# Patient Record
Sex: Male | Born: 1960 | Race: Black or African American | Hispanic: No | Marital: Married | Smoking: Former smoker
Health system: Southern US, Community
[De-identification: ages and names within clinical notes are randomized; demographics above are authoritative.]

## PROBLEM LIST (undated history)

## (undated) DIAGNOSIS — R0600 Dyspnea, unspecified: Secondary | ICD-10-CM

## (undated) DIAGNOSIS — C61 Malignant neoplasm of prostate: Secondary | ICD-10-CM

## (undated) DIAGNOSIS — R351 Nocturia: Secondary | ICD-10-CM

## (undated) DIAGNOSIS — C7951 Secondary malignant neoplasm of bone: Principal | ICD-10-CM

## (undated) DIAGNOSIS — I4891 Unspecified atrial fibrillation: Secondary | ICD-10-CM

## (undated) DIAGNOSIS — Z7189 Other specified counseling: Secondary | ICD-10-CM

## (undated) DIAGNOSIS — M549 Dorsalgia, unspecified: Secondary | ICD-10-CM

## (undated) DIAGNOSIS — I499 Cardiac arrhythmia, unspecified: Secondary | ICD-10-CM

## (undated) DIAGNOSIS — Z923 Personal history of irradiation: Secondary | ICD-10-CM

## (undated) HISTORY — DX: Malignant neoplasm of prostate: C61

## (undated) HISTORY — DX: Other specified counseling: Z71.89

## (undated) HISTORY — DX: Dorsalgia, unspecified: M54.9

## (undated) HISTORY — PX: NO PAST SURGERIES: SHX2092

## (undated) HISTORY — DX: Secondary malignant neoplasm of bone: C79.51

## (undated) HISTORY — DX: Personal history of irradiation: Z92.3

---

## 2000-07-24 ENCOUNTER — Emergency Department (HOSPITAL_COMMUNITY): Admission: EM | Admit: 2000-07-24 | Discharge: 2000-07-24 | Payer: Self-pay

## 2016-03-15 ENCOUNTER — Emergency Department (HOSPITAL_BASED_OUTPATIENT_CLINIC_OR_DEPARTMENT_OTHER)
Admission: EM | Admit: 2016-03-15 | Discharge: 2016-03-15 | Disposition: A | Discharge status: Home / Self Care | Payer: BLUE CROSS/BLUE SHIELD | Attending: Physician Assistant | Admitting: Physician Assistant

## 2016-03-15 ENCOUNTER — Emergency Department (HOSPITAL_BASED_OUTPATIENT_CLINIC_OR_DEPARTMENT_OTHER): Payer: BLUE CROSS/BLUE SHIELD

## 2016-03-15 ENCOUNTER — Encounter (HOSPITAL_BASED_OUTPATIENT_CLINIC_OR_DEPARTMENT_OTHER): Payer: Self-pay | Admitting: *Deleted

## 2016-03-15 DIAGNOSIS — M545 Low back pain, unspecified: Secondary | ICD-10-CM

## 2016-03-15 DIAGNOSIS — F17210 Nicotine dependence, cigarettes, uncomplicated: Secondary | ICD-10-CM | POA: Insufficient documentation

## 2016-03-15 MED ORDER — IBUPROFEN 800 MG PO TABS: 800.0000 mg | ORAL_TABLET | Freq: Three times a day (TID) | ORAL | 0 refills | Status: DC

## 2016-03-15 MED ORDER — CYCLOBENZAPRINE HCL 10 MG PO TABS: 10.0000 mg | ORAL_TABLET | Freq: Two times a day (BID) | ORAL | 0 refills | Status: DC | PRN

## 2016-03-15 NOTE — ED Provider Notes (Signed)
[facility] Provider Note   CSN: [id number] Arrival date & time: 03/15/16  1555  By signing my name below, I, Jeanell Sparrow, attest that this documentation has been prepared under the direction and in the presence of Gonsalo Cuthbertson Julio Alm, MD. Electronically Signed: Jeanell Sparrow, Scribe. 03/15/2016. 5:01 PM.  History   Chief Complaint Chief Complaint  Patient presents with  . Back Pain   The history is provided by the patient. No language interpreter was used.   HPI Comments: Ryan Davis is a 56 y.o. male who presents to the Emergency Department complaining of constant moderate lower back pain that started about 3 weeks ago. He states his pain was onset by lifting his wife. He describes the pain as radiating to his abdomen and exacerbated by movement. No associated symptoms. He denies any gait problem, numbness/tingling, or other complaints.   History reviewed. No pertinent past medical history.  There are no active problems to display for this patient.   History reviewed. No pertinent surgical history.     Home Medications    Prior to Admission medications   Not on File    Family History History reviewed. No pertinent family history.  Social History Social History  Substance Use Topics  . Smoking status: Current Every Day Smoker    Packs/day: 0.50    Types: Cigarettes  . Smokeless tobacco: Never Used  . Alcohol use Yes     Comment: 40oz daily     Allergies   Patient has no known allergies.   Review of Systems Review of Systems  Gastrointestinal: Positive for abdominal pain.  Musculoskeletal: Positive for back pain. Negative for gait problem.  Neurological: Negative for numbness.  All other systems reviewed and are negative.    Physical Exam Updated Vital Signs BP 132/100 (BP Location: Left Arm)   Pulse 101   Temp 97.9 F (36.6 C) (Oral)   Resp 18   Ht 6' 2.5" (1.892 m)   Wt 178 lb (80.7 kg)   SpO2 100%   BMI 22.55 kg/m    Physical Exam  Constitutional: He appears well-developed and well-nourished. No distress.  HENT:  Head: Normocephalic and atraumatic.  Eyes: Conjunctivae are normal.  Neck: Neck supple.  Cardiovascular: Normal rate.   Pulmonary/Chest: Effort normal.  Abdominal: Soft.  Musculoskeletal: Normal range of motion.  TTP to the lumbar paraspinal area. Good strength in BLE.   Neurological: He is alert.  Skin: Skin is warm and dry.  Psychiatric: He has a normal mood and affect.  Nursing note and vitals reviewed.    ED Treatments / Results  DIAGNOSTIC STUDIES: Oxygen Saturation is 100% on RA, normal by my interpretation.    COORDINATION OF CARE: 5:05 PM- Pt advised of plan for treatment and pt agrees.  Labs (all labs ordered are listed, but only abnormal results are displayed) Labs Reviewed - No data to display  EKG  EKG Interpretation None       Radiology Dg Lumbar Spine Complete  Result Date: 03/15/2016 CLINICAL DATA:  Patient with acute onset low back pain for 3 weeks. Twisting injury. Initial encounter. EXAM: LUMBAR SPINE - COMPLETE 4+ VIEW COMPARISON:  None. FINDINGS: There is no evidence of lumbar spine fracture. Alignment is normal. Intervertebral disc spaces are maintained. There is suggestion of patchy sclerosis involving the lumbar spine and sacrum. IMPRESSION: No acute osseous abnormality. Suggestion of patchy sclerosis involving the lumbar spine and sacrum which is nonspecific and may be secondary to technical factors and overlying  bowel gas. True sclerotic osseous lesions are not excluded. Recommend correlation with lumbar spine CT in the non acute setting to exclude the possibility of true osseous lesions. Electronically Signed   By: Lovey Newcomer M.D.   On: 03/15/2016 17:49    Procedures Procedures (including critical care time)  Medications Ordered in ED Medications - No data to display   Initial Impression / Assessment and Plan / ED Course  I have reviewed  the triage vital signs and the nursing notes.  Pertinent labs & imaging results that were available during my care of the patient were reviewed by me and considered in my medical decision making (see chart for details).    I personally performed the services described in this documentation, which was scribed in my presence. The recorded information has been reviewed and is accurate.    Patient's well-appearing 56 year old male presenting with back pain. Patient is here because his mother who he cares for was being brought in for her  high sugars. He reports he has back pain from helping move her around. Chronic.  No red flags. Normal physical exam. We'll get x-ray and likely discharge with muscle relaxants and ibuprofen.  Patient told to follow up ouatpiatne for L spine CT.    Final Clinical Impressions(s) / ED Diagnoses   Final diagnoses:  Lumbar pain  Acute bilateral low back pain without sciatica    New Prescriptions New Prescriptions   No medications on file      Kristion Holifield Julio Alm, MD 03/15/16 1810

## 2016-03-15 NOTE — ED Triage Notes (Signed)
Pt reports back pain x 3 weeks.  Ambulatory, no difficulty moving.  denies known injury.

## 2016-03-15 NOTE — Discharge Instructions (Signed)
We think that this pain is likely a muscle strain from helping a family member. However the x-ray shows some nonspecific findings we'll need to have a repeat CAT scan by your primary care physician's as an outpatient.

## 2016-03-26 ENCOUNTER — Emergency Department (HOSPITAL_BASED_OUTPATIENT_CLINIC_OR_DEPARTMENT_OTHER): Payer: BLUE CROSS/BLUE SHIELD

## 2016-03-26 ENCOUNTER — Emergency Department (HOSPITAL_BASED_OUTPATIENT_CLINIC_OR_DEPARTMENT_OTHER)
Admission: EM | Admit: 2016-03-26 | Discharge: 2016-03-26 | Disposition: A | Discharge status: Home / Self Care | Payer: BLUE CROSS/BLUE SHIELD | Attending: Emergency Medicine | Admitting: Emergency Medicine

## 2016-03-26 ENCOUNTER — Encounter (HOSPITAL_BASED_OUTPATIENT_CLINIC_OR_DEPARTMENT_OTHER): Payer: Self-pay | Admitting: *Deleted

## 2016-03-26 DIAGNOSIS — M545 Low back pain, unspecified: Secondary | ICD-10-CM

## 2016-03-26 DIAGNOSIS — F17210 Nicotine dependence, cigarettes, uncomplicated: Secondary | ICD-10-CM | POA: Insufficient documentation

## 2016-03-26 DIAGNOSIS — Y999 Unspecified external cause status: Secondary | ICD-10-CM | POA: Diagnosis not present

## 2016-03-26 DIAGNOSIS — Y9389 Activity, other specified: Secondary | ICD-10-CM | POA: Diagnosis not present

## 2016-03-26 DIAGNOSIS — X500XXA Overexertion from strenuous movement or load, initial encounter: Secondary | ICD-10-CM | POA: Diagnosis not present

## 2016-03-26 DIAGNOSIS — Y929 Unspecified place or not applicable: Secondary | ICD-10-CM | POA: Diagnosis not present

## 2016-03-26 DIAGNOSIS — S3992XA Unspecified injury of lower back, initial encounter: Secondary | ICD-10-CM | POA: Diagnosis present

## 2016-03-26 MED ORDER — HYDROCODONE-ACETAMINOPHEN 5-325 MG PO TABS: 1.0000 | ORAL_TABLET | Freq: Four times a day (QID) | ORAL | 0 refills | Status: DC | PRN

## 2016-03-26 MED ORDER — PREDNISONE 10 MG (21) PO TBPK: ORAL_TABLET | Freq: Every day | ORAL | 0 refills | Status: DC

## 2016-03-26 MED ORDER — TRAMADOL HCL 50 MG PO TABS: 50.0000 mg | ORAL_TABLET | Freq: Four times a day (QID) | ORAL | 0 refills | Status: DC | PRN

## 2016-03-26 MED FILL — HYDROCODON-APAP 5-325: 5-325 | 5 days supply | Qty: 20 | Fill #0

## 2016-03-26 NOTE — Discharge Instructions (Addendum)
We have discussed her CAT scan findings. I have given your information to the oncologist upstairs to call you for an appointment. Have given you their information to call them if he did not hear from them the next week or so. They took the pain medicine. Return to the ED if her symptoms worsen.

## 2016-03-26 NOTE — ED Triage Notes (Signed)
Pt c/o back pain. 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 States pain goes into his legs as well

## 2016-03-26 NOTE — ED Notes (Signed)
Patient transported to CT 

## 2016-03-27 NOTE — ED Provider Notes (Signed)
[facility] Provider Note   CSN: [id number] Arrival date & time: 03/26/16  1059     History   Chief Complaint Chief Complaint  Patient presents with  . Back Pain    HPI Ryan Davis is a 56 y.o. male.  HPI 56 year old African-American male with no significant past medical history presents to the ED today with complaints of constant increased lower back pain. States that his symptoms started approximately 4 weeks ago. States the pain was caused by lifting his bedbound wife. States the pain is in the middle of his lower back and radiates down his bilateral legs. Movement makes the pain worse. Nothing makes the pain better. He was seen in the ER on 2/17 for same. X-rays showed a possible osseous lesions and encouraged outpatient CT scan. Patient states they've been unable to get a CAT scan performed. He was given muscle relaxers and NSAIDs with little relief in his symptoms. Return today for increasing pain. Denies any urinary retention, loss of bowel or bladder, saddle paresthesias, lower show any paresthesias, gait problems, fevers, history of IV drug use, history of cancer, urinary symptoms. History reviewed. No pertinent past medical history.  There are no active problems to display for this patient.   History reviewed. No pertinent surgical history.     Home Medications    Prior to Admission medications   Medication Sig Start Date End Date Taking? Authorizing Provider  cyclobenzaprine (FLEXERIL) 10 MG tablet Take 1 tablet (10 mg total) by mouth 2 (two) times daily as needed for muscle spasms. 03/15/16  Yes Courteney Lyn Mackuen, MD  ibuprofen (ADVIL,MOTRIN) 800 MG tablet Take 1 tablet (800 mg total) by mouth 3 (three) times daily. 03/15/16  Yes Courteney Lyn Mackuen, MD  HYDROcodone-acetaminophen (NORCO) 5-325 MG tablet Take 1 tablet by mouth every 6 (six) hours as needed. 03/26/16   Doristine Devoid, PA-C    Family History No family history on file.  Social  History Social History  Substance Use Topics  . Smoking status: Current Every Day Smoker    Packs/day: 0.50    Types: Cigarettes  . Smokeless tobacco: Never Used  . Alcohol use Yes     Comment: 40oz daily     Allergies   Patient has no known allergies.   Review of Systems Review of Systems  Constitutional: Negative for chills and fever.  HENT: Negative for congestion.   Eyes: Negative for visual disturbance.  Respiratory: Negative for cough and shortness of breath.   Cardiovascular: Negative for chest pain and palpitations.  Gastrointestinal: Negative for abdominal pain, nausea and vomiting.  Genitourinary: Negative for hematuria and urgency.  Musculoskeletal: Positive for back pain. Negative for gait problem and neck pain.  Skin: Negative.   Neurological: Negative for dizziness, weakness, light-headedness, numbness and headaches.  All other systems reviewed and are negative.    Physical Exam Updated Vital Signs BP 132/92   Pulse 78   Temp 98.1 F (36.7 C) (Oral)   Resp 16   Ht 6\' 2"  (1.88 m)   Wt 81.6 kg   SpO2 99%   BMI 23.11 kg/m   Physical Exam  Constitutional: He is oriented to person, place, and time. He appears well-developed and well-nourished. No distress.  Eyes: Right eye exhibits no discharge. Left eye exhibits no discharge. No scleral icterus.  Neck: Normal range of motion. Neck supple.  Pulmonary/Chest: No respiratory distress.  Musculoskeletal: Normal range of motion.  Midline L-spine tenderness. L-spine paraspinal tenderness. Radiates to the  bilateral buttocks. Positive straight leg raise test bilaterally that reproduces pain and paresthesias. No midline T-spine tenderness. Full range of motion.  Lymphadenopathy:    He has no cervical adenopathy.  Neurological: He is alert and oriented to person, place, and time.  Strength 5 out of 5 in lower extremity. Sensation intact sharp/dull. Cap refill normal. DP pulses are 2+ bilaterally. The patient able  to ambulate with normal gait.  Skin: Skin is warm and dry. Capillary refill takes less than 2 seconds. No pallor.  Nursing note and vitals reviewed.    ED Treatments / Results  Labs (all labs ordered are listed, but only abnormal results are displayed) Labs Reviewed - No data to display  EKG  EKG Interpretation None       Radiology Ct Lumbar Spine Wo Contrast  Result Date: 03/26/2016 CLINICAL DATA:  Low back pain.  Leg pain. EXAM: CT LUMBAR SPINE WITHOUT CONTRAST TECHNIQUE: Multidetector CT imaging of the lumbar spine was performed without intravenous contrast administration. Multiplanar CT image reconstructions were also generated. COMPARISON:  Radiographs dated 03/15/2016 and 10/03/2006 FINDINGS: Segmentation: 13 rib-bearing vertebra. Four typical lumbar segments. I will term the lowest 5 segments as L1 through L5 for purposes of this exam. Alignment: Normal. Vertebrae: Extensive sclerotic metastatic disease throughout the entire visualized portion of the skeleton including the lower thoracic and lumbar spine, ribs, and the visualized portions of the pelvic bones. Paraspinal and other soft tissues: Normal.  No discrete adenopathy. Disc levels: The discs are normal throughout the lumbar spine. No visible tumor extension into the spinal canal. IMPRESSION: Extensive blastic metastatic disease throughout the entire visualized portion of the skeleton. 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 No current pathologic fractures. No disc protrusions or discrete extension of tumor into the spinal canal. Electronically Signed   By: Lorriane Shire M.D.   On: 03/26/2016 12:43    Procedures Procedures (including critical care time)  Medications Ordered in ED Medications - No data to display   Initial Impression / Assessment and Plan / ED Course  I have reviewed the triage vital signs and the nursing notes.  Pertinent labs & imaging results that were available during my care of the  patient were reviewed by me and considered in my medical decision making (see chart for details).     Patient resents to the ED with ongoing low back pain that is unresolved by muscle relaxers and NSAIDs. X-rays performed last week show possible osseous lesions. Patient does not have a PCP to follow up outpatient for nonemergent CAT scan. Lumbar CAT scan was performed in the ED today. CT shows extensive blastic metastases disease throughout the entire lumbar region likely due to metastatic prostate cancer. Patient no history of cancer. Discussed findings with the patient. Have spoken with Dr. Eldridge Scot with oncology. Has recommended that I refer patient to Dr. Jillene Bucks. Spoke to his nurse will call patient for an appointment this week. Has no further recommendations. Patient is hemodynamically stable. Patient was updated on plan of care. Given pain medicine. Instructed return precautions. Has no red flag symptoms concerning for cauda equina. Patient was discussed with Dr. Stark Jock who is agreeable to the above plan. Has no further questions prior to discharge.  Final Clinical Impressions(s) / ED Diagnoses   Final diagnoses:  Acute midline low back pain without sciatica    New Prescriptions Discharge Medication List as of 03/26/2016  2:08 PM    START taking these medications   Details  HYDROcodone-acetaminophen ([place])  5-325 MG tablet Take 1 tablet by mouth every 6 (six) hours as needed., Starting Wed 03/26/2016, Print         Doristine Devoid, PA-C 03/27/16 1557    Veryl Speak, MD 03/28/16 2259

## 2016-03-28 ENCOUNTER — Ambulatory Visit: Payer: BLUE CROSS/BLUE SHIELD

## 2016-03-28 ENCOUNTER — Encounter: Payer: Self-pay | Admitting: Hematology & Oncology

## 2016-03-28 ENCOUNTER — Encounter (HOSPITAL_BASED_OUTPATIENT_CLINIC_OR_DEPARTMENT_OTHER): Payer: Self-pay | Admitting: Emergency Medicine

## 2016-03-28 ENCOUNTER — Other Ambulatory Visit (HOSPITAL_BASED_OUTPATIENT_CLINIC_OR_DEPARTMENT_OTHER): Payer: Self-pay

## 2016-03-28 ENCOUNTER — Other Ambulatory Visit: Payer: Self-pay

## 2016-03-28 ENCOUNTER — Other Ambulatory Visit (HOSPITAL_BASED_OUTPATIENT_CLINIC_OR_DEPARTMENT_OTHER): Payer: BLUE CROSS/BLUE SHIELD

## 2016-03-28 ENCOUNTER — Emergency Department (HOSPITAL_BASED_OUTPATIENT_CLINIC_OR_DEPARTMENT_OTHER): Payer: BLUE CROSS/BLUE SHIELD

## 2016-03-28 ENCOUNTER — Inpatient Hospital Stay (HOSPITAL_BASED_OUTPATIENT_CLINIC_OR_DEPARTMENT_OTHER)
Admission: EM | Admit: 2016-03-28 | Discharge: 2016-03-31 | DRG: 309 | Disposition: A | Discharge status: Home / Self Care | Payer: BLUE CROSS/BLUE SHIELD | Attending: Internal Medicine | Admitting: Internal Medicine

## 2016-03-28 ENCOUNTER — Ambulatory Visit (HOSPITAL_BASED_OUTPATIENT_CLINIC_OR_DEPARTMENT_OTHER): Payer: BLUE CROSS/BLUE SHIELD | Admitting: Hematology & Oncology

## 2016-03-28 VITALS — BP 119/82 | HR 118 | Temp 97.6°F | Resp 17

## 2016-03-28 DIAGNOSIS — R7989 Other specified abnormal findings of blood chemistry: Secondary | ICD-10-CM

## 2016-03-28 DIAGNOSIS — D6959 Other secondary thrombocytopenia: Secondary | ICD-10-CM | POA: Diagnosis present

## 2016-03-28 DIAGNOSIS — I4891 Unspecified atrial fibrillation: Principal | ICD-10-CM | POA: Diagnosis present

## 2016-03-28 DIAGNOSIS — I248 Other forms of acute ischemic heart disease: Secondary | ICD-10-CM | POA: Diagnosis present

## 2016-03-28 DIAGNOSIS — C7951 Secondary malignant neoplasm of bone: Secondary | ICD-10-CM

## 2016-03-28 DIAGNOSIS — M549 Dorsalgia, unspecified: Secondary | ICD-10-CM

## 2016-03-28 DIAGNOSIS — C801 Malignant (primary) neoplasm, unspecified: Secondary | ICD-10-CM | POA: Diagnosis not present

## 2016-03-28 DIAGNOSIS — R001 Bradycardia, unspecified: Secondary | ICD-10-CM | POA: Diagnosis not present

## 2016-03-28 DIAGNOSIS — I499 Cardiac arrhythmia, unspecified: Secondary | ICD-10-CM | POA: Diagnosis not present

## 2016-03-28 DIAGNOSIS — C61 Malignant neoplasm of prostate: Secondary | ICD-10-CM | POA: Diagnosis present

## 2016-03-28 DIAGNOSIS — I480 Paroxysmal atrial fibrillation: Secondary | ICD-10-CM | POA: Diagnosis present

## 2016-03-28 DIAGNOSIS — F17210 Nicotine dependence, cigarettes, uncomplicated: Secondary | ICD-10-CM | POA: Diagnosis present

## 2016-03-28 HISTORY — DX: Dorsalgia, unspecified: M54.9

## 2016-03-28 LAB — MAGNESIUM: MAGNESIUM: 2 mg/dL (ref 1.7–2.4)

## 2016-03-28 LAB — CBC WITH DIFFERENTIAL/PLATELET
BASOS PCT: 0 %
Band Neutrophils: 0 %
Basophils Absolute: 0 10*3/uL (ref 0.0–0.1)
Blasts: 0 %
EOS ABS: 0.2 10*3/uL (ref 0.0–0.7)
EOS PCT: 3 %
HCT: 43.6 % (ref 39.0–52.0)
Hemoglobin: 15 g/dL (ref 13.0–17.0)
LYMPHS ABS: 3.2 10*3/uL (ref 0.7–4.0)
Lymphocytes Relative: 40 %
MCH: 30.9 pg (ref 26.0–34.0)
MCHC: 34.4 g/dL (ref 30.0–36.0)
MCV: 89.7 fL (ref 78.0–100.0)
METAMYELOCYTES PCT: 0 %
MONO ABS: 0.6 10*3/uL (ref 0.1–1.0)
MONOS PCT: 7 %
MYELOCYTES: 2 %
NRBC: 0 /100{WBCs}
Neutro Abs: 4 10*3/uL (ref 1.7–7.7)
Neutrophils Relative %: 48 %
PLATELETS: 116 10*3/uL — AB (ref 150–400)
Promyelocytes Absolute: 0 %
RBC: 4.86 MIL/uL (ref 4.22–5.81)
RDW: 14.5 % (ref 11.5–15.5)
WBC: 8 10*3/uL (ref 4.0–10.5)

## 2016-03-28 LAB — URINALYSIS, ROUTINE W REFLEX MICROSCOPIC
BILIRUBIN URINE: NEGATIVE
Glucose, UA: NEGATIVE mg/dL
Hgb urine dipstick: NEGATIVE
KETONES UR: NEGATIVE mg/dL
NITRITE: NEGATIVE
PH: 5 (ref 5.0–8.0)
PROTEIN: NEGATIVE mg/dL
Specific Gravity, Urine: 1.012 (ref 1.005–1.030)

## 2016-03-28 LAB — COMPREHENSIVE METABOLIC PANEL
ALBUMIN: 3.7 g/dL (ref 3.5–5.0)
ALT: 12 U/L — ABNORMAL LOW (ref 17–63)
ANION GAP: 6 (ref 5–15)
AST: 18 U/L (ref 15–41)
Alkaline Phosphatase: 1093 U/L — ABNORMAL HIGH (ref 38–126)
BUN: 6 mg/dL (ref 6–20)
CHLORIDE: 104 mmol/L (ref 101–111)
CO2: 25 mmol/L (ref 22–32)
Calcium: 8.5 mg/dL — ABNORMAL LOW (ref 8.9–10.3)
Creatinine, Ser: 0.74 mg/dL (ref 0.61–1.24)
GFR calc non Af Amer: >60 mL/min (ref >60)
GLUCOSE: 118 mg/dL — AB (ref 65–99)
POTASSIUM: 4.1 mmol/L (ref 3.5–5.1)
SODIUM: 135 mmol/L (ref 135–145)
Total Bilirubin: 0.5 mg/dL (ref 0.3–1.2)
Total Protein: 7.2 g/dL (ref 6.5–8.1)

## 2016-03-28 LAB — CBC WITH DIFFERENTIAL (CANCER CENTER ONLY)
BASO#: 0.1 10*3/uL (ref 0.0–0.2)
BASO%: 0.5 % (ref 0.0–2.0)
EOS%: 3.1 % (ref 0.0–7.0)
Eosinophils Absolute: 0.3 10*3/uL (ref 0.0–0.5)
HEMATOCRIT: 44.7 % (ref 38.7–49.9)
HGB: 15.5 g/dL (ref 13.0–17.1)
LYMPH#: 3.2 10*3/uL (ref 0.9–3.3)
LYMPH%: 34.4 % (ref 14.0–48.0)
MCH: 31 pg (ref 28.0–33.4)
MCHC: 34.7 g/dL (ref 32.0–35.9)
MCV: 89 fL (ref 82–98)
MONO#: 0.9 10*3/uL (ref 0.1–0.9)
MONO%: 9.2 % (ref 0.0–13.0)
NEUT#: 4.9 10*3/uL (ref 1.5–6.5)
NEUT%: 52.8 % (ref 40.0–80.0)
Platelets: 113 10*3/uL — ABNORMAL LOW (ref 145–400)
RBC: 5 10*6/uL (ref 4.20–5.70)
RDW: 14.2 % (ref 11.1–15.7)
WBC: 9.3 10*3/uL (ref 4.0–10.0)

## 2016-03-28 LAB — PROTIME-INR
INR: 1.09
PROTHROMBIN TIME: 14.1 s (ref 11.4–15.2)

## 2016-03-28 LAB — URINALYSIS, MICROSCOPIC (REFLEX)

## 2016-03-28 LAB — CMP (CANCER CENTER ONLY)
ALT: 16 U/L (ref 10–47)
AST: 21 U/L (ref 11–38)
Albumin: 3.7 g/dL (ref 3.3–5.5)
Alkaline Phosphatase: 1121 U/L — ABNORMAL HIGH (ref 26–84)
BUN: 4 mg/dL — AB (ref 7–22)
CALCIUM: 8.6 mg/dL (ref 8.0–10.3)
CO2: 25 mEq/L (ref 18–33)
CREATININE: 0.7 mg/dL (ref 0.6–1.2)
Chloride: 106 mEq/L (ref 98–108)
Glucose, Bld: 123 mg/dL — ABNORMAL HIGH (ref 73–118)
POTASSIUM: 4.1 meq/L (ref 3.3–4.7)
Sodium: 139 mEq/L (ref 128–145)
Total Bilirubin: 0.6 mg/dl (ref 0.20–1.60)
Total Protein: 7.3 g/dL (ref 6.4–8.1)

## 2016-03-28 LAB — I-STAT CG4 LACTIC ACID, ED: Lactic Acid, Venous: 0.91 mmol/L (ref 0.5–1.9)

## 2016-03-28 LAB — LIPASE, BLOOD: LIPASE: 17 U/L (ref 11–51)

## 2016-03-28 LAB — CHCC SATELLITE - SMEAR

## 2016-03-28 MED ORDER — DILTIAZEM HCL 30 MG PO TABS
30.0000 mg | ORAL_TABLET | Freq: Four times a day (QID) | ORAL | Status: DC
Start: 2016-03-29 — End: 2016-03-29
  Administered 2016-03-28: 30 mg via ORAL
  Filled 2016-03-28: qty 1

## 2016-03-28 MED ORDER — DEXTROSE 5 % IV SOLN
5.0000 mg/h | INTRAVENOUS | Status: DC
  Administered 2016-03-28: 5 mg/h via INTRAVENOUS
  Filled 2016-03-28: qty 100

## 2016-03-28 MED ORDER — SODIUM CHLORIDE 0.9 % IV BOLUS (SEPSIS)
1000.0000 mL | Freq: Once | INTRAVENOUS | Status: AC
  Administered 2016-03-28: 1000 mL via INTRAVENOUS

## 2016-03-28 NOTE — Progress Notes (Signed)
56yo gentleman without significant past medical history who has had recent back pain.  Found to have blastic lesions on xray, concerning for metastatic disease.  Apparently, he saw Dr. Marin Olp today in [facility] and was found to be in atrial fibrillation with RVR.  He was sent to the ED in [facility].  He has received 1L of NS.  He does not appear to be septic.  HR remains 110's-120's.  ED attending to start cardizem infusion at 5mg /hr.  Anticoagulation deferred at this time (no known risk factors and his platelets are low). Place in observation in the stepdown unit (due to the need for cardizem infusion) at this time.  He will need oncology follow-up as well.

## 2016-03-28 NOTE — ED Provider Notes (Signed)
[facility] Provider Note   CSN: [id number] Arrival date & time: 03/28/16  1638     History   Chief Complaint Chief Complaint  Patient presents with  . Irregular Heart Beat    HPI Ryan Davis is a 56 y.o. 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 Patient reports that he is the primary caregiver for his wife who has MS, strokes, and recent admission for severe anemia to [facility]. He reports that he has to pick her up and carry her frequently and has had back pain for the last 3 weeks. Several days ago, while she was getting evaluated, patient also was seen and a CT image was done revealing evidence of widespread metastatic disease to the spine likely from the prostate. Patient had a follow-up appointment today upstairs and EKG found that patient was tachycardic in the 130s and new atrial fibrillation. Patient denies lightheadedness, palpitations, chest pain, shortness of breath. He denies syncope, or any symptoms other than the chronic low back pain. He denies any nausea, vomiting, diaphoresis, numbness, tingling, or weakness. He denies any abdominal pain. He reports that he does not feel any symptoms of his fast heart rate. He has no history of this.      HPI  Past Medical History:  Diagnosis Date  . Back pain without radiculopathy 03/28/2016    Patient Active Problem List   Diagnosis Date Noted  . Back pain without radiculopathy 03/28/2016    History reviewed. No pertinent surgical history.     Home Medications    Prior to Admission medications   Medication Sig Start Date End Date Taking? Authorizing Provider  cyclobenzaprine (FLEXERIL) 10 MG tablet Take 1 tablet (10 mg total) by mouth 2 (two) times daily as needed for muscle spasms. 03/15/16   Courteney Lyn Mackuen, MD  HYDROcodone-acetaminophen  (NORCO) 5-325 MG tablet Take 1 tablet by mouth every 6 (six) hours as needed. 03/26/16   Doristine Devoid, PA-C  ibuprofen (ADVIL,MOTRIN) 800 MG tablet Take 1 tablet (800 mg total) by mouth 3 (three) times daily. 03/15/16   Courteney Lyn Mackuen, MD    Family History History reviewed. No pertinent family history.  Social History Social History  Substance Use Topics  . Smoking status: Current Every Day Smoker    Packs/day: 0.50    Types: Cigarettes  . Smokeless tobacco: Never Used  . Alcohol use Yes     Comment: 40oz daily     Allergies   Patient has no known allergies.   Review of Systems Review of Systems  Constitutional: Negative for activity change, chills, diaphoresis, fatigue and fever.  HENT: Negative for congestion and rhinorrhea.   Eyes: Negative for visual disturbance.  Respiratory: Negative for cough, choking, chest tightness, shortness of breath, wheezing and stridor.   Cardiovascular: Negative for chest pain, palpitations and leg swelling.  Gastrointestinal: Negative for abdominal distention, abdominal pain, blood in stool, constipation, diarrhea, nausea and vomiting.  Genitourinary: Negative for difficulty urinating, dysuria and flank pain.  Musculoskeletal: Positive for back pain. Negative for gait problem.  Skin: Negative for rash and wound.  Neurological: Negative for dizziness, weakness, light-headedness and headaches.  Psychiatric/Behavioral: Negative for agitation and confusion.  All other systems reviewed and are negative.    Physical Exam Updated Vital Signs BP 111/80 (BP Location: Right Arm)   Pulse (!) 127   Temp  97.9 F (36.6 C) (Oral)   Resp 24   SpO2 98%   Physical Exam  Constitutional: He is oriented to person, place, and time. He appears well-developed and well-nourished. No distress.  HENT:  Head: Normocephalic and atraumatic.  Right Ear: External ear normal.  Left Ear: External ear normal.  Nose: Nose normal.  Mouth/Throat:  Oropharynx is clear and moist. No oropharyngeal exudate.  Eyes: Conjunctivae and EOM are normal. Pupils are equal, round, and reactive to light.  Neck: Normal range of motion. Neck supple.  Cardiovascular: Normal heart sounds and intact distal pulses.  An irregularly irregular rhythm present. Tachycardia present.   No murmur heard. Pulmonary/Chest: Effort normal and breath sounds normal. No stridor. No respiratory distress.  Abdominal: Soft. There is no tenderness. There is no rebound and no guarding.  Musculoskeletal: He exhibits tenderness. He exhibits no edema.       Lumbar back: He exhibits tenderness.       Back:  Neurological: He is alert and oriented to person, place, and time. He displays no tremor and normal reflexes. No cranial nerve deficit or sensory deficit. He exhibits normal muscle tone. Coordination normal. GCS eye subscore is 4. GCS verbal subscore is 5. GCS motor subscore is 6.  Skin: Skin is warm. No rash noted. He is not diaphoretic. No erythema.  Nursing note and vitals reviewed.    ED Treatments / Results  Labs (all labs ordered are listed, but only abnormal results are displayed) Labs Reviewed  CBC WITH DIFFERENTIAL/PLATELET - Abnormal; Notable for the following:       Result Value   Platelets 116 (*)    All other components within normal limits  COMPREHENSIVE METABOLIC PANEL - Abnormal; Notable for the following:    Glucose, Bld 118 (*)    Calcium 8.5 (*)    ALT 12 (*)    Alkaline Phosphatase 1,093 (*)    All other components within normal limits  URINALYSIS, ROUTINE W REFLEX MICROSCOPIC - Abnormal; Notable for the following:    APPearance CLOUDY (*)    Leukocytes, UA MODERATE (*)    All other components within normal limits  URINALYSIS, MICROSCOPIC (REFLEX) - Abnormal; Notable for the following:    Bacteria, UA FEW (*)    Squamous Epithelial / LPF 0-5 (*)    All other components within normal limits  TROPONIN I - Abnormal; Notable for the following:     Troponin I 0.06 (*)    All other components within normal limits  URINE CULTURE  LIPASE, BLOOD  PROTIME-INR  MAGNESIUM  TSH  HIV ANTIBODY (ROUTINE TESTING)  T4, FREE  TROPONIN I  TROPONIN I  I-STAT CG4 LACTIC ACID, ED    EKG  EKG Interpretation  Date/Time:  Friday March 28 2016 17:13:08 EST Ventricular Rate:  103 PR Interval:    QRS Duration: 70 QT Interval:  335 QTC Calculation: 439 R Axis:   70 Text Interpretation:  Atrial fibrillation Anteroseptal infarct, age indeterminate Minimal ST elevation, inferior leads Atrial fibrillation No STEMI Confirmed by Sherry Ruffing MD, Effie Janoski [phone]) on 03/28/2016 5:17:08 PM       Radiology Dg Chest 2 View  Result Date: 03/28/2016 CLINICAL DATA:  Back pain without radiculopathy EXAM: CHEST  2 VIEW COMPARISON:  10/03/2006 thoracic spine radiographs FINDINGS: The heart size and mediastinal contours are within normal limits. Both lungs are clear. 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 This is a new finding since prior. No acute fracture. 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 No acute fracture identified. Electronically Signed   By: Ashley Royalty M.D.   On: 03/28/2016 17:48    Procedures Procedures (including critical care time)  Medications Ordered in ED Medications  HYDROcodone-acetaminophen (NORCO/VICODIN) 5-325 MG per tablet 1 tablet (not administered)  acetaminophen (TYLENOL) tablet 650 mg (not administered)  ondansetron (ZOFRAN) injection 4 mg (not administered)  Influenza vac split quadrivalent PF (FLUARIX) injection 0.5 mL (not administered)  diltiazem (CARDIZEM) tablet 60 mg (not administered)  metoprolol tartrate (LOPRESSOR) tablet 25 mg (not administered)  sodium chloride 0.9 % bolus 1,000 mL (0 mLs Intravenous Stopped 03/28/16 1815)     Initial Impression / Assessment and Plan / ED Course  I have reviewed the  triage vital signs and the nursing notes.  Pertinent labs & imaging results that were available during my care of the patient were reviewed by me and considered in my medical decision making (see chart for details).     Ryan Davis is a 56 y.o. 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  History and exam are seen above.  On exam, patient has a fast irregular pulse. Patient has symmetric radial pulses. Patient has no focal neurologic deficits. Patient's lungs are clear. Abdomen is nontender. Low back is tender to palpation.  Patient initial EKG shows atrial fibrillation with a rate in the 130s. Patient will be given fluids to try and slow down his heart rate. Patient will have laboratory testing to look for electrolyte abnormality, occult infection, or end organ damage.  Patient will likely need to be started on rate control medications and admitted for further management.   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 Given lack of urinary symptoms, do not feel patient has UTI. Magnesium normal. INR normal on arrival. Lipase nonelevated. CMP showed no significant electrolyte abnormalities however calcium was slightly decreased  Alk phos was over 1000. Suspect this is an acute phase  Reactant for the likely cancer. CBC showed low platelets but otherwise no significant abnormalities.  Patient initially started on diltiazem  Drip. Patient quickly had improvement in heart rate. Patient weaned off of drip and started on  Oral diltiazem. Patient will be admitted for further management,  discussion of anticoagulation, And further management of his new cancer diagnosis.  Patient admitted in stable condition with no development of cardiac symptoms  Such as chest pain, shortness of breath,  or palpitations. Patient admitted in stable condition.   Final Clinical Impressions(s) / ED Diagnoses   Final diagnoses:  Atrial fibrillation with RVR (HCC)     Clinical Impression: 1. Atrial fibrillation with RVR ([place])     Disposition: Admit to Hospitalist service    Courtney Paris, MD 03/29/16 [phone]

## 2016-03-28 NOTE — ED Notes (Signed)
Cardizem infusion stopped per VO Dr. Sherry Ruffing.

## 2016-03-28 NOTE — Progress Notes (Signed)
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 : I have cancer my bones.  HPI: Ryan Davis is a very nice 56 year old African-American male. He is originally from [place]. He has no stomach and past medical history. He does not have any family doctor.  He began to have worsening lower back pain over the past several weeks. It did not radiate.  He's lost a little bit of weight. He thinks he has lost about 10 pounds. His appetite is okay.  He's had no change in bowel or bladder habits. He never has had a PSA drawn.  He does smoke. He probably has a 20-pack-year history of tobacco use. I don't think he really has an alcohol history.  There is no family history of cancer.  He went to the emergency room at [facility]. He had a CT scan done. He initially had plain films done of the lumbar spine. There is no fracture. He has some patchy sclerosis. He then had the CT scan done. The CT scan showed extensive blastic metastatic disease throughout the entire skeleton. It is felt that this likely was metastatic prostate cancer. I don't think any lab work done.  He has had no issues with leg swelling. He has had no rashes. He's had no cough. He's had no headache.  Overall, his performance status is ECOG 1.    Past Medical History:  Diagnosis Date  . Back pain without radiculopathy 03/28/2016  :  No past surgical history on file.:  No current facility-administered medications for this visit.   Current Outpatient Prescriptions:  .  cyclobenzaprine (FLEXERIL) 10 MG tablet, Take 1 tablet (10 mg total) by mouth 2 (two) times daily as needed for muscle spasms., Disp: 10 tablet, Rfl: 0 .  HYDROcodone-acetaminophen (NORCO) 5-325 MG tablet, Take 1 tablet by mouth every 6 (six) hours as needed., Disp: 20 tablet, Rfl: 0 .  ibuprofen (ADVIL,MOTRIN) 800 MG tablet, Take 1 tablet (800 mg total) by mouth 3 (three)  times daily., Disp: 21 tablet, Rfl: 0  Facility-Administered Medications Ordered in Other Visits:  .  sodium chloride 0.9 % bolus 1,000 mL, 1,000 mL, Intravenous, Once, Courtney Paris, MD:  :  No Known Allergies:  No family history on file.:  Social History   Social History  . Marital status: Married    Spouse name: N/A  . Number of children: N/A  . Years of education: N/A   Occupational History  . Not on file.   Social History Main Topics  . Smoking status: Current Every Day Smoker    Packs/day: 0.50    Types: Cigarettes  . Smokeless tobacco: Never Used  . Alcohol use Yes     Comment: 40oz daily  . Drug use: No  . Sexual activity: Not on file   Other Topics Concern  . Not on file   Social History Narrative  . No narrative on file  :  Pertinent items are noted in HPI.  Exam: @IPVITALS @ Thin but well-nourished African-American male. Vital signs show temperature of 97.6. Pulse is 118. Blood pressure 119/82. Weight is 150 pounds. Head and neck exam shows no ocular or oral lesions. There is no scleral icterus. He has no adenopathy in the neck. Thyroid is nonpalpable. Lungs are clear bilaterally. Cardiac exam rapid and irregular consistent with atrial fibrillation  with no murmurs, rubs or bruits. Abdomen is soft. He has good bowel sounds. There is no  fluid wave. There is no palpable liver or spleen tip. Back exam shows some slight tenderness to percussion of the lower spine. He has no spasms in the back muscles. Extremities shows no clubbing, cyanosis or edema. Skin exam shows no rashes, ecchymoses or petechia. Neurological exam shows no focal neurological deficits.   Recent Labs  03/28/16 1432  WBC 9.3  HGB 15.5  HCT 44.7  PLT 113 Occ Giant platelets present*    Recent Labs  03/28/16 1433  NA 139  K 4.1  CL 106  CO2 25  GLUCOSE 123*  BUN 4*  CREATININE 0.7  CALCIUM 8.6    Blood smear review:  None  Pathology: None     Assessment and Plan:   Ryan Davis is a 56 year old African-American male. He has a markedly, markedly elevated alkaline phosphatase. That, in addition to his CT scan highly suggest metastatic prostate cancer. Given that he is African-American, he would be at a higher risk for prostate cancer.  Unfortunately, his problem right now is that he has rapid atrial fibrillation. We did an EKG on him in the clinic. He has atrial fibrillation.  This has to be treated initially.  I will have him go down to the emergency room for evaluation.  We will see what his PSA shows as far as possible prostate cancer. Again I have to believe that he has metastatic prostate cancer.  I talked to him about with other diagnosis was. I explained that what he has is treatable but not curable.  Given the atrial fibrillation, I really did not discuss goals of care with him. I thought that this would be more appropriate when I talked him about the treatment options for prostate cancer if that is what he has.  We will have to get staging studies on him once we know that he is diagnosed with prostate cancer. I'm not sure we have to do any kind of biopsies. I suppose that we may have to consider a prostate biopsy. As such, we may have to get him over to urology at some point.  I spent about an hour with him. Again, I am most worried about the atrial fibrillation. If he has to be admitted, we will see him in the hospital. Maybe we get a biopsy there.

## 2016-03-28 NOTE — ED Notes (Signed)
ED Provider at bedside. 

## 2016-03-28 NOTE — ED Triage Notes (Signed)
Patient was upstairs at MD and was found to have a increased and irregular HR

## 2016-03-28 NOTE — ED Notes (Signed)
Patient transported to X-ray 

## 2016-03-29 ENCOUNTER — Encounter (HOSPITAL_COMMUNITY): Payer: Self-pay | Admitting: Internal Medicine

## 2016-03-29 ENCOUNTER — Observation Stay (HOSPITAL_BASED_OUTPATIENT_CLINIC_OR_DEPARTMENT_OTHER): Payer: BLUE CROSS/BLUE SHIELD

## 2016-03-29 ENCOUNTER — Other Ambulatory Visit: Payer: Self-pay

## 2016-03-29 DIAGNOSIS — C799 Secondary malignant neoplasm of unspecified site: Secondary | ICD-10-CM | POA: Diagnosis not present

## 2016-03-29 DIAGNOSIS — M549 Dorsalgia, unspecified: Secondary | ICD-10-CM | POA: Diagnosis not present

## 2016-03-29 DIAGNOSIS — I4891 Unspecified atrial fibrillation: Secondary | ICD-10-CM

## 2016-03-29 LAB — TSH: TSH: 2.89 u[IU]/mL (ref 0.350–4.500)

## 2016-03-29 LAB — ECHOCARDIOGRAM COMPLETE
Height: 74 in
WEIGHTICAEL: 2377.6 [oz_av]

## 2016-03-29 LAB — TROPONIN I
TROPONIN I: 0.06 ng/mL — AB (ref <0.03)
TROPONIN I: 0.05 ng/mL — AB (ref <0.03)
Troponin I: 0.05 ng/mL (ref <0.03)

## 2016-03-29 LAB — HIV ANTIBODY (ROUTINE TESTING W REFLEX): HIV Screen 4th Generation wRfx: NONREACTIVE

## 2016-03-29 LAB — PSA

## 2016-03-29 LAB — T4, FREE: FREE T4: 0.81 ng/dL (ref 0.61–1.12)

## 2016-03-29 MED ORDER — METOPROLOL TARTRATE 25 MG PO TABS
25.0000 mg | ORAL_TABLET | Freq: Two times a day (BID) | ORAL | Status: DC
  Administered 2016-03-29: 25 mg via ORAL
  Filled 2016-03-29: qty 1

## 2016-03-29 MED ORDER — ACETAMINOPHEN 325 MG PO TABS
650.0000 mg | ORAL_TABLET | ORAL | Status: DC | PRN
Start: 2016-03-29 — End: 2016-03-31

## 2016-03-29 MED ORDER — METOPROLOL TARTRATE 25 MG PO TABS: 25.0000 mg | ORAL_TABLET | Freq: Two times a day (BID) | ORAL | Status: DC

## 2016-03-29 MED ORDER — DILTIAZEM HCL 60 MG PO TABS
60.0000 mg | ORAL_TABLET | Freq: Four times a day (QID) | ORAL | Status: DC
  Administered 2016-03-29 – 2016-03-30 (×4): 60 mg via ORAL
  Filled 2016-03-29 (×4): qty 1

## 2016-03-29 MED ORDER — INFLUENZA VAC SPLIT QUAD 0.5 ML IM SUSY
0.5000 mL | PREFILLED_SYRINGE | INTRAMUSCULAR | Status: AC
  Administered 2016-03-30: 0.5 mL via INTRAMUSCULAR
  Filled 2016-03-29: qty 0.5

## 2016-03-29 MED ORDER — ONDANSETRON HCL 4 MG/2ML IJ SOLN: 4.0000 mg | Freq: Four times a day (QID) | INTRAMUSCULAR | Status: DC | PRN

## 2016-03-29 MED ORDER — DILTIAZEM HCL 30 MG PO TABS
30.0000 mg | ORAL_TABLET | Freq: Four times a day (QID) | ORAL | Status: DC
  Administered 2016-03-29: 30 mg via ORAL
  Filled 2016-03-29: qty 1

## 2016-03-29 MED ORDER — HYDROCODONE-ACETAMINOPHEN 5-325 MG PO TABS: 1.0000 | ORAL_TABLET | Freq: Four times a day (QID) | ORAL | Status: DC | PRN

## 2016-03-29 NOTE — Progress Notes (Signed)
CRITICAL VALUE ALERT  Critical value received: Troponin 0.06  Date of notification: 03/29/16  Time of notification:  0415  Critical value read back:Yes  Nurse who received alert:  Dellie Catholic  MD notified (1st page):  yes  Time of first page:  0425

## 2016-03-29 NOTE — Consult Note (Signed)
CONSULT NOTE  Date: 03/29/2016               Patient Name:  Ryan Davis MRN: [id number]  DOB: Jun 21, 1960 Age / Sex: 56 y.o., male        PCP: No PCP Per Patient Primary Cardiologist: Wyvonne Lenz            Referring Physician: Aileen Fass              Reason for Consult: Atrial fib           History of Present Illness: Patient is a 56 y.o. male with a PMHx of newly discovered blastic bone lesions ( c/w metastatic cancer) , who was admitted to [facility] on 03/28/2016 for evaluation of  Palpitations - was found to have atrial fib with RVR .      Medications: Outpatient medications: Prescriptions Prior to Admission  Medication Sig Dispense Refill Last Dose  . cyclobenzaprine (FLEXERIL) 10 MG tablet Take 1 tablet (10 mg total) by mouth 2 (two) times daily as needed for muscle spasms. 10 tablet 0 03/29/2016 at Unknown time  . HYDROcodone-acetaminophen (NORCO) 5-325 MG tablet Take 1 tablet by mouth every 6 (six) hours as needed. (Patient taking differently: Take 1 tablet by mouth every 6 (six) hours as needed for moderate pain. ) 20 tablet 0 03/29/2016 at Unknown time  . ibuprofen (ADVIL,MOTRIN) 800 MG tablet Take 1 tablet (800 mg total) by mouth 3 (three) times daily. 21 tablet 0 03/29/2016 at Unknown time    Current medications: Current Facility-Administered Medications  Medication Dose Route Frequency Provider Last Rate Last Dose  . acetaminophen (TYLENOL) tablet 650 mg  650 mg Oral Q4H PRN Lily Kocher, MD      . diltiazem (CARDIZEM) tablet 60 mg  60 mg Oral Q6H Charlynne Cousins, MD      . HYDROcodone-acetaminophen (NORCO/VICODIN) 5-325 MG per tablet 1 tablet  1 tablet Oral Q6H PRN Lily Kocher, MD      . Derrill Memo ON 03/30/2016] Influenza vac split quadrivalent PF (FLUARIX) injection 0.5 mL  0.5 mL Intramuscular Tomorrow-1000 Lily Kocher, MD      . ondansetron [facility]) injection 4 mg  4 mg Intravenous Q6H PRN Lily Kocher, MD         No Known Allergies   Past Medical History:    Diagnosis Date  . Back pain without radiculopathy 03/28/2016    Past Surgical History:  Procedure Laterality Date  . NO PAST SURGERIES      Family History  Problem Relation Age of Onset  . Stroke Sister   . Cancer Neg Hx   . Heart disease Neg Hx   . Diabetes Neg Hx     Social History:  reports that he has been smoking Cigarettes.  He has been smoking about 0.50 packs per day. He has never used smokeless tobacco. He reports that he drinks alcohol. He reports that he does not use drugs.   Review of Systems: Constitutional:  denies fever, chills, diaphoresis, appetite change and fatigue.  HEENT: denies photophobia, eye pain, redness, hearing loss, ear pain, congestion, sore throat, rhinorrhea, sneezing, neck pain, neck stiffness and tinnitus.  Respiratory: denies SOB, DOE, cough, chest tightness, and wheezing.  Cardiovascular: admits to   palpitations    Gastrointestinal: denies nausea, vomiting, abdominal pain, diarrhea, constipation, blood in stool.  Genitourinary: denies dysuria, urgency, frequency, hematuria, flank pain and difficulty urinating.  Musculoskeletal: admits to    back pain,  Skin: denies pallor, rash and wound.  Neurological: denies dizziness, seizures, syncope, weakness, light-headedness, numbness and headaches.   Hematological: denies adenopathy, easy bruising, personal or family bleeding history.  Psychiatric/ Behavioral: denies suicidal ideation, mood changes, confusion, nervousness, sleep disturbance and agitation.    Physical Exam: BP 121/73 (BP Location: Right Arm)   Pulse (!) 110   Temp 98 F (36.7 C) (Oral)   Resp 18   Ht 6\' 2"  (1.88 m)   Wt 148 lb 9.6 oz (67.4 kg) Comment: NT stated that the patient's weight was 148.6 LB  SpO2 99%   BMI 19.08 kg/m   Wt Readings from Last 3 Encounters:  03/29/16 148 lb 9.6 oz (67.4 kg)  03/26/16 180 lb (81.6 kg)  03/15/16 178 lb (80.7 kg)    General: Vital signs reviewed and noted.  Thin black gentleman ,  NAD   Head: Normocephalic, atraumatic, sclera anicteric,  Poor dentition   Neck: Supple. Negative for carotid bruits. No JVD   Lungs:  Clear bilaterally, no  wheezes, rales, or rhonchi. Breathing is normal   Heart: Irreg. Irreg. with S1 S2 , soft systolic murmur   Abdomen/ GI :  Soft, non-tender, non-distended with normoactive bowel sounds. No hepatomegaly. No rebound/guarding. No obvious abdominal masses   MSK: Strength and the appear normal for age.   Tenderness along mid spinal region  Extremities: No clubbing or cyanosis. No edema.  Distal pedal pulses are 2+ and equal   Neurologic:  CN are grossly intact,  No obvious motor or sensory defect.  Alert and oriented X 3. Moves all extremities spontaneously.  Psych: Responds to questions appropriately with a normal affect.     Lab results: Basic Metabolic Panel:  Recent Labs Lab 03/28/16 1433 03/28/16 1701  NA 139 135  K 4.1 4.1  CL 106 104  CO2 25 25  GLUCOSE 123* 118*  BUN 4* 6  CREATININE 0.7 0.74  CALCIUM 8.6 8.5*  MG  --  2.0    Liver Function Tests:  Recent Labs Lab 03/28/16 1433 03/28/16 1701  AST 21 18  ALT 16 12*  ALKPHOS 1,121* 1,093*  BILITOT 0.60 0.5  PROT 7.3 7.2  ALBUMIN 3.7 3.7    Recent Labs Lab 03/28/16 1701  LIPASE 17   No results for input(s): AMMONIA in the last 168 hours.  CBC:  Recent Labs Lab 03/28/16 1432 03/28/16 1701  WBC 9.3 8.0  NEUTROABS 4.9 4.0  HGB 15.5 15.0  HCT 44.7 43.6  MCV 89 89.7  PLT 113 Occ Giant platelets present* 116*    Cardiac Enzymes:  Recent Labs Lab 03/29/16 0205  TROPONINI 0.06*    BNP: Invalid input(s): POCBNP  CBG: No results for input(s): GLUCAP in the last 168 hours.  Coagulation Studies:  Recent Labs  03/28/16 1701  LABPROT 14.1  INR 1.09     Other results: Personal review of EKG shows :  -  Atrial fib with RVR   Imaging: Dg Chest 2 View  Result Date: 03/28/2016 CLINICAL DATA:  Back pain without radiculopathy EXAM: CHEST  2  VIEW COMPARISON:  10/03/2006 thoracic spine radiographs FINDINGS: The heart size and mediastinal contours are within normal limits. Both lungs are clear. 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 This is a new finding since prior. No acute fracture. 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 No acute fracture identified. Electronically Signed   By: Ashley Royalty  M.D.   On: 03/28/2016 17:48      ECG - atrial fib with V rate of 90. No ST or T wave changes    Assessment & Plan:   1. Atrial fib with RVR:   HR has improved on PO cardiazem New onset  Just started having palpitations yesterday  Echo to be done today  CHADS2VASC = 0  Will add metoprolol 25 BID   If he remains in atrial fib, at some point we should consider oral anticoagulation so that we can do a cardioversion.   He may convert on his own.   Currently he has thrombocytopenia. Given CHADS2VASC of 0 , will hold off for now and concentrate on HR control.   2. Elevated Troponin:   Likely due to demand ischemia:   No ST changes on ECG Likely related to rapid atrial fib Echo today     3. Prostate cancer :  PSA is 5839 Has evidence of bone mets.  Further management per Dr. Toney Rakes, Brooke Bonito., MD, [facility] 03/29/2016, 8:48 AM Office - [phone] Pager [phone]

## 2016-03-29 NOTE — Progress Notes (Signed)
Pt's HR now dropping to 50s at times, still in A-Fib. BP 92/59. Pt asymptomatic, alert and sitting in bed. MD Venetia Constable paged to be made aware.

## 2016-03-29 NOTE — Progress Notes (Signed)
CRITICAL VALUE ALERT  Critical value received:  Trop 0.05  Date of notification:  03/29/16  MD notified (1st page):  Feliz-Ortiz  Time of first page:  56  MD notified (2nd page):  Time of second page:  Responding MD:   Time MD responded:

## 2016-03-29 NOTE — H&P (Signed)
History and Physical    Ryan Davis [id number] DOB: November 04, 1960 DOA: 03/28/2016  PCP: No PCP Per Patient   Patient coming from: [facility]  Chief Complaint: Sent to the ED for abnormal heart rhythm  HPI: Ryan Davis is a 56 y.o. gentleman without significant past medical history who is the primary caregiver for his wife who has MS.  The patient has had increased back pain for the past several weeks and has been evaluated in the ED twice.  On 2/17 the patient had lumbar xrays that were suggestive of sclerotic osseous lesions and outpatient CT was recommended.  He as given prescriptions for NSAIDs and muscle relaxers.  On 2/28, he went back to the ED after his wife was admitted to [facility] for evaluation of anemia.  This time, CT scan of the lumbar spine was pursued (patient does not have PCP and the likelihood of him getting a referral for the outpatient scan was low).  Unfortunately, this scan showed extensive blastic metastatic lesions throughout the entire visualized portion of his spine.  No pathologic fractures or discrete extension of tumor into the spinal canal was identified.  The patient was referred to Dr. Marin Olp.  The patient was being evaluated in clinic by Dr. Marin Olp today when he was found to be in atrial fibrillation with RVR.  He was referred to the ED in [facility].  The patient admits to intermittent palpitations for at least a few weeks.  He was never bothered enough to seek medical attention.  No chest pain or shortness of breath.  No falls.  No syncope.  He has had ongoing low back pain.  No numbness or tingling in his legs.  No urinary retention, but he has had dysuria and hesitancy.  ED Course: The patient was found to be in atrial fibrillation with RVR.  He initially received a 1L NS bolus.  He was then placed on a cardizem infusion and accepted to the stepdown unit at [facility].  However, he was ultimately weaned from the cardizem infusion before a bed was available, so his bed request  was changed to telemetry.  He has received one dose of oral cardizem.  HR 60's - 80's now but still irregular.  Review of Systems: As per HPI otherwise 10 point review of systems negative.    Past Medical History:  Diagnosis Date  . Back pain without radiculopathy 03/28/2016    Past Surgical History:  Procedure Laterality Date  . NO PAST SURGERIES       reports that he has been smoking Cigarettes.  He has been smoking about 0.50 packs per day. He has never used smokeless tobacco. He reports that he drinks alcohol. He reports that he does not use drugs. He is married.  He does not have biological children but he has step children.  No Known Allergies  Family History  Problem Relation Age of Onset  . Stroke Sister   . Cancer Neg Hx   . Heart disease Neg Hx   . Diabetes Neg Hx      Prior to Admission medications   Medication Sig Start Date End Date Taking? Authorizing Provider  cyclobenzaprine (FLEXERIL) 10 MG tablet Take 1 tablet (10 mg total) by mouth 2 (two) times daily as needed for muscle spasms. 03/15/16   Courteney Lyn Mackuen, MD  HYDROcodone-acetaminophen (NORCO) 5-325 MG tablet Take 1 tablet by mouth every 6 (six) hours as needed. 03/26/16   Doristine Devoid, PA-C  ibuprofen (ADVIL,MOTRIN) 800 MG  tablet Take 1 tablet (800 mg total) by mouth 3 (three) times daily. 03/15/16   Courteney Lyn Mackuen, MD    Physical Exam: Vitals:   03/28/16 2130 03/28/16 2230 03/28/16 2323 03/29/16 0105  BP: 116/99 132/95 119/79 124/85  Pulse: 80 91  70  Resp: 18 (!) 27  18  Temp:    97.7 F (36.5 C)  TempSrc:    Oral  SpO2: 100% 100%  100%  Weight:    67.4 kg (148 lb 9.6 oz)  Height:    6' 2"  (1.88 m)      Constitutional: NAD, calm, comfortable, nontoxic appearing Vitals:   03/28/16 2130 03/28/16 2230 03/28/16 2323 03/29/16 0105  BP: 116/99 132/95 119/79 124/85  Pulse: 80 91  70  Resp: 18 (!) 27  18  Temp:    97.7 F (36.5 C)  TempSrc:    Oral  SpO2: 100% 100%  100%    Weight:    67.4 kg (148 lb 9.6 oz)  Height:    6' 2"  (1.88 m)   Eyes: PERRL, lids and conjunctivae normal ENMT: Mucous membranes are moist. Posterior pharynx clear of any exudate or lesions. Normal dentition.  Neck: normal appearance, supple, no masses Respiratory: clear to auscultation bilaterally, no wheezing, no crackles. Normal respiratory effort. No accessory muscle use.  Cardiovascular: Irregular but rate controlled.  No murmurs / rubs / gallops. No extremity edema. 2+ pedal pulses. GI: abdomen is soft and compressible.  No distention.  No tenderness.  No masses palpated.  Bowel sounds are present. Musculoskeletal:  No joint deformity in upper and lower extremities. Good ROM, no contractures. Normal muscle tone. No significant TTP over his spine. Skin: no rashes, warm and dry Neurologic: CN 2-12 grossly intact. Sensation intact, Strength symmetric bilaterally, 5/5.  Negative straight leg raises bilaterally. Psychiatric: Normal judgment and insight. Alert and oriented x 3. Normal mood.     Labs on Admission: I have personally reviewed following labs and imaging studies  CBC:  Recent Labs Lab 03/28/16 1432 03/28/16 1701  WBC 9.3 8.0  NEUTROABS 4.9 4.0  HGB 15.5 15.0  HCT 44.7 43.6  MCV 89 89.7  PLT 113 Occ Giant platelets present* 462*   Basic Metabolic Panel:  Recent Labs Lab 03/28/16 1433 03/28/16 1701  NA 139 135  K 4.1 4.1  CL 106 104  CO2 25 25  GLUCOSE 123* 118*  BUN 4* 6  CREATININE 0.7 0.74  CALCIUM 8.6 8.5*  MG  --  2.0   GFR: Estimated Creatinine Clearance: 100.6 mL/min (by C-G formula based on SCr of 0.74 mg/dL). Liver Function Tests:  Recent Labs Lab 03/28/16 1433 03/28/16 1701  AST 21 18  ALT 16 12*  ALKPHOS 1,121* 1,093*  BILITOT 0.60 0.5  PROT 7.3 7.2  ALBUMIN 3.7 3.7    Recent Labs Lab 03/28/16 1701  LIPASE 17   Coagulation Profile:  Recent Labs Lab 03/28/16 1701  INR 1.09   Urine analysis:    Component Value Date/Time    COLORURINE YELLOW 03/28/2016 1701   APPEARANCEUR CLOUDY (A) 03/28/2016 1701   LABSPEC 1.012 03/28/2016 1701   PHURINE 5.0 03/28/2016 1701   GLUCOSEU NEGATIVE 03/28/2016 1701   HGBUR NEGATIVE 03/28/2016 1701   BILIRUBINUR NEGATIVE 03/28/2016 1701   KETONESUR NEGATIVE 03/28/2016 1701   PROTEINUR NEGATIVE 03/28/2016 1701   NITRITE NEGATIVE 03/28/2016 1701   LEUKOCYTESUR MODERATE (A) 03/28/2016 1701   Sepsis Labs:  Lactic acid level 0.91  Radiological Exams on Admission: Dg Chest  2 View  Result Date: 03/28/2016 CLINICAL DATA:  Back pain without radiculopathy EXAM: CHEST  2 VIEW COMPARISON:  10/03/2006 thoracic spine radiographs FINDINGS: The heart size and mediastinal contours are within normal limits. Both lungs are clear. 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 This is a new finding since prior. No acute fracture. 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 No acute fracture identified. Electronically Signed   By: Ashley Royalty M.D.   On: 03/28/2016 17:48    EKG: Independently reviewed. Multiple EKGs have shown atrial fibrillation with RVR.  I do not see significant ST changes (greater than 60m).  Assessment/Plan Principal Problem:   Atrial fibrillation (HCC) Active Problems:   Back pain without radiculopathy   Metastatic cancer (HCC)  Thrombocytopenia    New onset atrial fibrillation with RVR --Oral cardizem 354mpo [id number] for systolic blood pressure less than 100 or HR less than 65 --Hold on anticoagulation for now (CHADS-Vasc ZERO plus he has thrombocytopenia) --Check TFTs --Serial troponin --Complete echo in the AM --Consider discussing with cardiology in the AM  New blastic lesions throughout his skeleton, concerning for metastatic disease and undoubtedly driving the elevated alk phos level --Will need follow up with Dr. EnMarin OlpLow back  pain --Analgesics as neededd  Urinary tract symptoms --U/A noted.  Will check culture.  Hold on antibiotics for now.  Thrombocytopenia --Presumably related to underlying malignancy --Follow trend; avoiding anticoagulants for now   DVT prophylaxis: Low risk, outpatient status, early ambulation Code Status: FULL Family Communication: Patient alone at time of admission. Disposition Plan: Expect he will go home at discharge. Consults called: NONE.  Consider calling cardiology in the AM. Admission status: Place in observation with telemetry monitoring.   TIME SPENT: 50 minutes   CaEber JonesD [facility] Pager [phone] 7PM-7AM, please contact night-coverage [email or web address] Password [email or web address]  03/29/2016, 1:52 AM

## 2016-03-29 NOTE — Progress Notes (Signed)
  Echocardiogram 2D Echocardiogram has been performed.  Ryan Davis 03/29/2016, 3:34 PM

## 2016-03-29 NOTE — Progress Notes (Signed)
[facility] PROGRESS NOTE    Progress Note  Ryan Davis  [id number] DOB: 1961-01-23 DOA: 03/28/2016 PCP: No PCP Per Patient     Brief Narrative:   Ryan Davis is an 55 y.o. male past medical history of lytic lesions followed by oncology Dr. Jonette Eva as outpatient for medications was found to be in A. fib with RVR had the oncologist office and was referred to the ED.  Assessment/Plan:   New onset Atrial fibrillation (HCC) Now on oral Cardizem, mild RVR will increase Cardizem. CHADS-Vasc ZERO plus he has thrombocytopenia. 2-D echo is pending, Mild elevation of cardiac biomarkers in the setting of RVR. Consulted cardiology  Elevated Troponins: In setting of RVR likely demand ischemia.  Elevated alkaline phosphatase: In the  setting of blastic lesion, he will follow-up with Dr. Jonette Eva as an outpatient.  Back pain without radiculopathy Continue his current regimen of analgesia.  Urinary tract symptoms: Urinalysis, shows signs of infection has no leukocytosis and no fever.    DVT prophylaxis: SCD's Family Communication: None Disposition Plan/Barrier to D/C: home in am Code Status:     Code Status Orders        Start     Ordered   03/29/16 0149  Full code  Continuous     03/29/16 0151    Code Status History    Date Active Date Inactive Code Status Order ID Comments User Context   This patient has a current code status but no historical code status.        IV Access:    Peripheral IV   Procedures and diagnostic studies:   Dg Chest 2 View  Result Date: 03/28/2016 CLINICAL DATA:  Back pain without radiculopathy EXAM: CHEST  2 VIEW COMPARISON:  10/03/2006 thoracic spine radiographs FINDINGS: The heart size and mediastinal contours are within normal limits. Both lungs are clear. 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 This is a new finding since prior. No acute fracture. 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 No acute fracture identified. Electronically Signed   By: Ashley Royalty M.D.   On: 03/28/2016 17:48     Medical Consultants:    None.  Anti-Infectives:   None  Subjective:    Ryan Davis no complains  Objective:    Vitals:   03/28/16 2230 03/28/16 2323 03/29/16 0105 03/29/16 0451  BP: 132/95 119/79 124/85 121/73  Pulse: 91  70 (!) 110  Resp: (!) 27  18 18   Temp:   97.7 F (36.5 C) 98 F (36.7 C)  TempSrc:   Oral Oral  SpO2: 100%  100% 99%  Weight:   67.4 kg (148 lb 9.6 oz)   Height:   6\' 2"  (1.88 m)     Intake/Output Summary (Last 24 hours) at 03/29/16 0726 Last data filed at 03/28/16 2233  Gross per 24 hour  Intake           1007.5 ml  Output                0 ml  Net           1007.5 ml   Filed Weights   03/29/16 0105  Weight: 67.4 kg (148 lb 9.6 oz)    Exam: General exam: In no acute distress. Respiratory system: Good air movement and clear to auscultation. Cardiovascular system: S1 & S2 heard, RRR. Gastrointestinal system: Abdomen is nondistended, soft and nontender.  Extremities: No pedal edema. Skin: No rashes, lesions or ulcers Psychiatry: Judgement and insight appear normal. Mood & affect appropriate.    Data Reviewed:    Labs: Basic Metabolic Panel:  Recent Labs Lab 03/28/16 1433 03/28/16 1701  NA 139 135  K 4.1 4.1  CL 106 104  CO2 25 25  GLUCOSE 123* 118*  BUN 4* 6  CREATININE 0.7 0.74  CALCIUM 8.6 8.5*  MG  --  2.0   GFR Estimated Creatinine Clearance: 100.6 mL/min (by C-G formula based on SCr of 0.74 mg/dL). Liver Function Tests:  Recent Labs Lab 03/28/16 1433 03/28/16 1701  AST 21 18  ALT 16 12*  ALKPHOS 1,121* 1,093*  BILITOT 0.60 0.5  PROT 7.3 7.2  ALBUMIN 3.7 3.7    Recent Labs Lab 03/28/16 1701  LIPASE 17   No results for input(s): AMMONIA in the last 168 hours. Coagulation profile  Recent Labs Lab 03/28/16 1701    INR 1.09    CBC:  Recent Labs Lab 03/28/16 1432 03/28/16 1701  WBC 9.3 8.0  NEUTROABS 4.9 4.0  HGB 15.5 15.0  HCT 44.7 43.6  MCV 89 89.7  PLT 113 Occ Giant platelets present* 116*   Cardiac Enzymes:  Recent Labs Lab 03/29/16 0205  TROPONINI 0.06*   BNP (last 3 results) No results for input(s): PROBNP in the last 8760 hours. CBG: No results for input(s): GLUCAP in the last 168 hours. D-Dimer: No results for input(s): DDIMER in the last 72 hours. Hgb A1c: No results for input(s): HGBA1C in the last 72 hours. Lipid Profile: No results for input(s): CHOL, HDL, LDLCALC, TRIG, CHOLHDL, LDLDIRECT in the last 72 hours. Thyroid function studies:  Recent Labs  03/29/16 0208  TSH 2.890   Anemia work up: No results for input(s): VITAMINB12, FOLATE, FERRITIN, TIBC, IRON, RETICCTPCT in the last 72 hours. Sepsis Labs:  Recent Labs Lab 03/28/16 1432 03/28/16 1701 03/28/16 1720  WBC 9.3 8.0  --   LATICACIDVEN  --   --  0.91   Microbiology No results found for this or any previous visit (from the past 240 hour(s)).   Medications:   . diltiazem  30 mg Oral Q6H  . [START ON 03/30/2016] Influenza vac split quadrivalent PF  0.5 mL Intramuscular Tomorrow-1000   Continuous Infusions:  Time spent: 25 min   LOS: 0 days   Charlynne Cousins  [facility] Pager [phone]  *Please refer to [email or web address], password [email or web address] to get updated schedule on who will round on this patient, as hospitalists switch teams weekly. If 7PM-7AM, please contact night-coverage at [email or web address], password [email or web address] for any overnight needs.  03/29/2016, 7:26 AM

## 2016-03-30 ENCOUNTER — Other Ambulatory Visit: Payer: Self-pay

## 2016-03-30 DIAGNOSIS — I4891 Unspecified atrial fibrillation: Secondary | ICD-10-CM | POA: Diagnosis present

## 2016-03-30 DIAGNOSIS — F17210 Nicotine dependence, cigarettes, uncomplicated: Secondary | ICD-10-CM | POA: Diagnosis present

## 2016-03-30 DIAGNOSIS — C799 Secondary malignant neoplasm of unspecified site: Secondary | ICD-10-CM | POA: Diagnosis not present

## 2016-03-30 DIAGNOSIS — M549 Dorsalgia, unspecified: Secondary | ICD-10-CM | POA: Diagnosis not present

## 2016-03-30 DIAGNOSIS — C61 Malignant neoplasm of prostate: Secondary | ICD-10-CM | POA: Diagnosis present

## 2016-03-30 DIAGNOSIS — C7951 Secondary malignant neoplasm of bone: Secondary | ICD-10-CM | POA: Diagnosis present

## 2016-03-30 DIAGNOSIS — D6959 Other secondary thrombocytopenia: Secondary | ICD-10-CM | POA: Diagnosis present

## 2016-03-30 DIAGNOSIS — I480 Paroxysmal atrial fibrillation: Secondary | ICD-10-CM | POA: Diagnosis present

## 2016-03-30 DIAGNOSIS — R001 Bradycardia, unspecified: Secondary | ICD-10-CM | POA: Diagnosis not present

## 2016-03-30 DIAGNOSIS — I248 Other forms of acute ischemic heart disease: Secondary | ICD-10-CM | POA: Diagnosis present

## 2016-03-30 DIAGNOSIS — I499 Cardiac arrhythmia, unspecified: Secondary | ICD-10-CM | POA: Diagnosis present

## 2016-03-30 LAB — URINE CULTURE: Culture: <10000 — AB

## 2016-03-30 MED ORDER — DILTIAZEM HCL ER COATED BEADS 240 MG PO CP24
240.0000 mg | ORAL_CAPSULE | Freq: Every day | ORAL | Status: DC
  Administered 2016-03-30 – 2016-03-31 (×2): 240 mg via ORAL
  Filled 2016-03-30 (×2): qty 1

## 2016-03-30 MED ORDER — METOPROLOL TARTRATE 25 MG PO TABS
12.5000 mg | ORAL_TABLET | Freq: Two times a day (BID) | ORAL | Status: DC
  Administered 2016-03-30 – 2016-03-31 (×3): 12.5 mg via ORAL
  Filled 2016-03-30 (×3): qty 1

## 2016-03-30 NOTE — Progress Notes (Signed)
Progress Note  Patient Name: Ryan Davis Date of Encounter: 03/30/2016  Primary Cardiologist: new, Denny Mccree  Subjective   56 y.o. 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 Metoprolol was held   Inpatient Medications    Scheduled Meds: . diltiazem  60 mg Oral Q6H  . Influenza vac split quadrivalent PF  0.5 mL Intramuscular Tomorrow-1000  . metoprolol tartrate  12.5 mg Oral BID   Continuous Infusions:  PRN Meds: acetaminophen, HYDROcodone-acetaminophen, ondansetron (ZOFRAN) IV   Vital Signs    Vitals:   03/29/16 2209 03/29/16 2224 03/29/16 2354 03/30/16 0627  BP: 105/62  109/71 109/66  Pulse: 94 65 74 97  Resp: 18   16  Temp: 98.1 F (36.7 C)   98.3 F (36.8 C)  TempSrc: Oral   Oral  SpO2: 99%   98%  Weight:    144 lb 8 oz (65.5 kg)  Height:        Intake/Output Summary (Last 24 hours) at 03/30/16 0757 Last data filed at 03/30/16 0641  Gross per 24 hour  Intake              240 ml  Output                5 ml  Net              235 ml   Filed Weights   03/29/16 0105 03/30/16 0627  Weight: 148 lb 9.6 oz (67.4 kg) 144 lb 8 oz (65.5 kg)    Telemetry    Atrial fib with V rate in 80=-90s - Personally Reviewed  ECG    Atrial fib  - Personally Reviewed  Physical Exam   GEN: No acute distress.   Neck: No JVD Cardiac: Irreg. Irreg. , no murmurs, rubs, or gallops.  Respiratory: Clear to auscultation bilaterally. GI: Soft, nontender, non-distended  MS: No edema; No deformity.  Tenderness along mid portion of spine  Neuro:  Nonfocal  Psych: Normal affect   Labs    Chemistry Recent Labs Lab 03/28/16 1433 03/28/16 1701  NA 139 135  K 4.1 4.1  CL 106 104  CO2 25 25  GLUCOSE 123* 118*  BUN 4* 6  CREATININE 0.7 0.74  CALCIUM 8.6 8.5*  PROT 7.3 7.2  ALBUMIN 3.7  3.7  AST 21 18  ALT 16 12*  ALKPHOS 1,121* 1,093*  BILITOT 0.60 0.5  GFRNONAA  --  >60  GFRAA  --  >60  ANIONGAP  --  6     Hematology Recent Labs Lab 03/28/16 1432 03/28/16 1701  WBC 9.3 8.0  RBC 5.00 4.86  HGB 15.5 15.0  HCT 44.7 43.6  MCV 89 89.7  MCH 31.0 30.9  MCHC 34.7 34.4  RDW 14.2 14.5  PLT 113 Occ Giant platelets present* 116*    Cardiac Enzymes Recent Labs Lab 03/29/16 0205 03/29/16 0900 03/29/16 1534  TROPONINI 0.06* 0.05* 0.05*   No results for input(s): TROPIPOC in the last 168 hours.   BNPNo results for input(s): BNP, PROBNP in the last 168 hours.   DDimer No results for input(s): DDIMER in the last 168 hours.   Radiology    Dg Chest 2 View  Result Date: 03/28/2016 CLINICAL DATA:  Back pain without radiculopathy EXAM: CHEST  2 VIEW COMPARISON:  10/03/2006 thoracic spine radiographs FINDINGS: The heart size and mediastinal contours are within normal limits. Both lungs are clear. 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 This is a new finding since prior. No acute fracture. 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 No acute fracture identified. Electronically Signed   By: Ashley Royalty M.D.   On: 03/28/2016 17:48    Cardiac Studies    Echo March 29, 2016:   Normal LV function with EF 55-60%. ( unable to assess diastolic dysfunction due to Afib)     Patient Profile     56 y.o. male newly diagnosed atrial fib    Assessment & Plan    1. Atrial fib:  CHADS2VASC score of 0 He has thrombocytopenia. Continue rate controlling meds.    Can use diltiazem  - will change to Dilt CD 240 a day    Will sign off. He probably can be discharged today  Call for questions    Signed, Mertie Moores, MD  03/30/2016, 7:57 AM

## 2016-03-30 NOTE — Progress Notes (Addendum)
[facility] PROGRESS NOTE    Progress Note  KYVAN WEST  [id number] DOB: [birth year] DOA: 03/28/2016 PCP: No PCP Per Patient     Brief Narrative:   REYAANSH RO is an 56 y.o. male past medical history of lytic lesions followed by oncology Dr. Jonette Eva as outpatient for medications was found to be in A. fib with RVR had the oncologist office and was referred to the ED.  Assessment/Plan:   New onset Atrial fibrillation (HCC) Cardizem and metoprolol, his metoprolol dose on 03/29/2016 at night was held due to bradycardia. CHADS-Vasc ZERO plus he has thrombocytopenia. 2-D echo EF 55%. Mild RVR due to withholding his dose of metoprolol. Decrease metoprolol dose.  Elevated Troponins: Likely due to demand ischemia denies any chest pain intravenous of breath.  Elevated alkaline phosphatase: In the  setting of blastic lesion, he will follow-up with Dr. Jonette Eva as an outpatient.  Back pain without radiculopathy Continue his current regimen of analgesia.  Urinary tract symptoms: Urinalysis, shows signs of infection has no leukocytosis and no fever.    DVT prophylaxis: SCD's Family Communication: None Disposition Plan/Barrier to D/C: home in am Code Status:     Code Status Orders        Start     Ordered   03/29/16 0149  Full code  Continuous     03/29/16 0151    Code Status History    Date Active Date Inactive Code Status Order ID Comments User Context   This patient has a current code status but no historical code status.        IV Access:    Peripheral IV   Procedures and diagnostic studies:   Dg Chest 2 View  Result Date: 03/28/2016 CLINICAL DATA:  Back pain without radiculopathy EXAM: CHEST  2 VIEW COMPARISON:  10/03/2006 thoracic spine radiographs FINDINGS: The heart size and mediastinal contours are within normal limits. Both lungs are clear. 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 This is a new finding since  prior. No acute fracture. 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 No acute fracture identified. Electronically Signed   By: Ashley Royalty M.D.   On: 03/28/2016 17:48     Medical Consultants:    None.  Anti-Infectives:   None  Subjective:    Gertrude E Birchard no complains  Objective:    Vitals:   03/29/16 2209 03/29/16 2224 03/29/16 2354 03/30/16 0627  BP: 105/62  109/71 109/66  Pulse: 94 65 74 97  Resp: 18   16  Temp: 98.1 F (36.7 C)   98.3 F (36.8 C)  TempSrc: Oral   Oral  SpO2: 99%   98%  Weight:    65.5 kg (144 lb 8 oz)  Height:        Intake/Output Summary (Last 24 hours) at 03/30/16 0733 Last data filed at 03/30/16 0641  Gross per 24 hour  Intake              240 ml  Output                5 ml  Net              235 ml   Filed Weights   03/29/16 0105 03/30/16 0627  Weight: 67.4 kg (148 lb 9.6 oz) 65.5 kg (144 lb 8 oz)    Exam: General exam: In no acute distress. Respiratory system: Good air  movement and clear to auscultation. Cardiovascular system: S1 & S2 heard, RRR. Gastrointestinal system: Abdomen is nondistended, soft and nontender.  Extremities: No pedal edema. Skin: No rashes, lesions or ulcers Psychiatry: Judgement and insight appear normal. Mood & affect appropriate.    Data Reviewed:    Labs: Basic Metabolic Panel:  Recent Labs Lab 03/28/16 1433 03/28/16 1701  NA 139 135  K 4.1 4.1  CL 106 104  CO2 25 25  GLUCOSE 123* 118*  BUN 4* 6  CREATININE 0.7 0.74  CALCIUM 8.6 8.5*  MG  --  2.0   GFR Estimated Creatinine Clearance: 97.8 mL/min (by C-G formula based on SCr of 0.74 mg/dL). Liver Function Tests:  Recent Labs Lab 03/28/16 1433 03/28/16 1701  AST 21 18  ALT 16 12*  ALKPHOS 1,121* 1,093*  BILITOT 0.60 0.5  PROT 7.3 7.2  ALBUMIN 3.7 3.7    Recent Labs Lab 03/28/16 1701  LIPASE 17   No results for input(s): AMMONIA in the last 168  hours. Coagulation profile  Recent Labs Lab 03/28/16 1701  INR 1.09    CBC:  Recent Labs Lab 03/28/16 1432 03/28/16 1701  WBC 9.3 8.0  NEUTROABS 4.9 4.0  HGB 15.5 15.0  HCT 44.7 43.6  MCV 89 89.7  PLT 113 Occ Giant platelets present* 116*   Cardiac Enzymes:  Recent Labs Lab 03/29/16 0205 03/29/16 0900 03/29/16 1534  TROPONINI 0.06* 0.05* 0.05*   BNP (last 3 results) No results for input(s): PROBNP in the last 8760 hours. CBG: No results for input(s): GLUCAP in the last 168 hours. D-Dimer: No results for input(s): DDIMER in the last 72 hours. Hgb A1c: No results for input(s): HGBA1C in the last 72 hours. Lipid Profile: No results for input(s): CHOL, HDL, LDLCALC, TRIG, CHOLHDL, LDLDIRECT in the last 72 hours. Thyroid function studies:  Recent Labs  03/29/16 0208  TSH 2.890   Anemia work up: No results for input(s): VITAMINB12, FOLATE, FERRITIN, TIBC, IRON, RETICCTPCT in the last 72 hours. Sepsis Labs:  Recent Labs Lab 03/28/16 1432 03/28/16 1701 03/28/16 1720  WBC 9.3 8.0  --   LATICACIDVEN  --   --  0.91   Microbiology No results found for this or any previous visit (from the past 240 hour(s)).   Medications:   . diltiazem  60 mg Oral Q6H  . Influenza vac split quadrivalent PF  0.5 mL Intramuscular Tomorrow-1000  . metoprolol tartrate  25 mg Oral BID   Continuous Infusions:  Time spent: 25 min   LOS: 0 days   Charlynne Cousins  [facility] Pager [phone]  *Please refer to [email or web address], password [email or web address] to get updated schedule on who will round on this patient, as hospitalists switch teams weekly. If 7PM-7AM, please contact night-coverage at [email or web address], password [email or web address] for any overnight needs.  03/30/2016, 7:33 AM

## 2016-03-31 ENCOUNTER — Encounter: Payer: Self-pay | Admitting: Hematology & Oncology

## 2016-03-31 ENCOUNTER — Other Ambulatory Visit: Payer: Self-pay | Admitting: Hematology & Oncology

## 2016-03-31 DIAGNOSIS — C61 Malignant neoplasm of prostate: Secondary | ICD-10-CM

## 2016-03-31 DIAGNOSIS — C7951 Secondary malignant neoplasm of bone: Principal | ICD-10-CM

## 2016-03-31 HISTORY — DX: Secondary malignant neoplasm of bone: C61

## 2016-03-31 LAB — KAPPA/LAMBDA LIGHT CHAINS
IG KAPPA FREE LIGHT CHAIN: 13.9 mg/L (ref 3.3–19.4)
Ig Lambda Free Light Chain: 15.6 mg/L (ref 5.7–26.3)
KAPPA/LAMBDA FLC RATIO: 0.89 (ref 0.26–1.65)

## 2016-03-31 LAB — CEA (IN HOUSE-CHCC): CEA (CHCC-In House): 16.58 ng/mL — ABNORMAL HIGH (ref 0.00–5.00)

## 2016-03-31 MED ORDER — METOPROLOL TARTRATE 25 MG PO TABS: 12.5000 mg | ORAL_TABLET | Freq: Two times a day (BID) | ORAL | 3 refills | Status: DC

## 2016-03-31 MED ORDER — DILTIAZEM HCL ER COATED BEADS 240 MG PO CP24
240.0000 mg | ORAL_CAPSULE | Freq: Every day | ORAL | 0 refills | Status: DC
Start: 2016-03-31 — End: 2016-05-13

## 2016-03-31 MED ORDER — ABIRATERONE ACETATE 250 MG PO TABS: 1000.0000 mg | ORAL_TABLET | Freq: Every day | ORAL | 6 refills | Status: DC

## 2016-03-31 MED ORDER — BICALUTAMIDE 50 MG PO TABS: 50.0000 mg | ORAL_TABLET | Freq: Every day | ORAL | 6 refills | Status: DC

## 2016-03-31 NOTE — Discharge Summary (Signed)
Physician Discharge Summary  Ryan Davis [id number] DOB: May 09, 1960 DOA: 03/28/2016  PCP: No PCP Per Patient  Admit date: 03/28/2016 Discharge date: 03/31/2016  Admitted From: home Disposition:  Home  Recommendations for Outpatient Follow-up:  1. Follow up with Oncology in 1-2 weeks 2. Follow up with cardiology in 2 week titrate rate controlling medications as needed.  Home Health:No Equipment/Devices:none  Discharge Condition:stable CODE STATUS:full Diet recommendation: Heart Healthy  Brief/Interim Summary: 56 y.o. male past medical history of lytic lesions followed by oncology Dr. Jonette Eva as outpatient for medications was found to be in A. fib with RVR had the oncologist office and was referred to the ED.  Discharge Diagnoses:  Principal Problem:   Atrial fibrillation with RVR (HCC) Active Problems:   Back pain without radiculopathy   Metastatic cancer [facility])   Atrial fibrillation ([place])  New onset Atrial fibrillation ([place]) Started on IV diltiazem cardiology was consulted and agree with treatment. Metoprolol started, his metoprolol dose on 03/29/2016 at night was held due to bradycardia, eventually metoprolol was decrease to 12.5 mg CHADS-Vasc ZERO plus he has thrombocytopenia. 2-D echo EF 55%.  Elevated Troponins: Likely due to demand ischemia denies any chest pain intravenous of breath.  Elevated alkaline phosphatase: In the  setting of blastic lesion, he will follow-up with Dr. Jonette Eva as an outpatient.  Back pain without radiculopathy Continue his current regimen of analgesia.  Urinary tract symptoms: Urinalysis, shows signs of infection has no leukocytosis and no fever.  Discharge Instructions  Discharge Instructions    Diet - low sodium heart healthy    Complete by:  As directed    Increase activity slowly    Complete by:  As directed      Allergies as of 03/31/2016   No Known Allergies     Medication List    TAKE these medications    cyclobenzaprine 10 MG tablet Commonly known as:  FLEXERIL Take 1 tablet (10 mg total) by mouth 2 (two) times daily as needed for muscle spasms.   diltiazem 240 MG 24 hr capsule Commonly known as:  CARDIZEM CD Take 1 capsule (240 mg total) by mouth daily.   HYDROcodone-acetaminophen 5-325 MG tablet Commonly known as:  NORCO Take 1 tablet by mouth every 6 (six) hours as needed. What changed:  reasons to take this   ibuprofen 800 MG tablet Commonly known as:  ADVIL,MOTRIN Take 1 tablet (800 mg total) by mouth 3 (three) times daily.   metoprolol tartrate 25 MG tablet Commonly known as:  LOPRESSOR Take 0.5 tablets (12.5 mg total) by mouth 2 (two) times daily.       No Known Allergies  Consultations:  Cardiology   Procedures/Studies: Dg Chest 2 View  Result Date: 03/28/2016 CLINICAL DATA:  Back pain without radiculopathy EXAM: CHEST  2 VIEW COMPARISON:  10/03/2006 thoracic spine radiographs FINDINGS: The heart size and mediastinal contours are within normal limits. Both lungs are clear. 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 This is a new finding since prior. No acute fracture. 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 No acute fracture identified. Electronically Signed   By: Ashley Royalty M.D.   On: 03/28/2016 17:48   Dg Lumbar Spine Complete  Result Date: 03/15/2016 CLINICAL DATA:  Patient with acute onset low back pain for 3 weeks. Twisting injury. Initial encounter. EXAM: LUMBAR SPINE - COMPLETE 4+ VIEW COMPARISON:  None. FINDINGS: There is no evidence of lumbar  spine fracture. Alignment is normal. Intervertebral disc spaces are maintained. There is suggestion of patchy sclerosis involving the lumbar spine and sacrum. IMPRESSION: No acute osseous abnormality. Suggestion of patchy sclerosis involving the lumbar spine and sacrum which is nonspecific and may be  secondary to technical factors and overlying bowel gas. True sclerotic osseous lesions are not excluded. Recommend correlation with lumbar spine CT in the non acute setting to exclude the possibility of true osseous lesions. Electronically Signed   By: Lovey Newcomer M.D.   On: 03/15/2016 17:49   Ct Lumbar Spine Wo Contrast  Result Date: 03/26/2016 CLINICAL DATA:  Low back pain.  Leg pain. EXAM: CT LUMBAR SPINE WITHOUT CONTRAST TECHNIQUE: Multidetector CT imaging of the lumbar spine was performed without intravenous contrast administration. Multiplanar CT image reconstructions were also generated. COMPARISON:  Radiographs dated 03/15/2016 and 10/03/2006 FINDINGS: Segmentation: 13 rib-bearing vertebra. Four typical lumbar segments. I will term the lowest 5 segments as L1 through L5 for purposes of this exam. Alignment: Normal. Vertebrae: Extensive sclerotic metastatic disease throughout the entire visualized portion of the skeleton including the lower thoracic and lumbar spine, ribs, and the visualized portions of the pelvic bones. Paraspinal and other soft tissues: Normal.  No discrete adenopathy. Disc levels: The discs are normal throughout the lumbar spine. No visible tumor extension into the spinal canal. IMPRESSION: Extensive blastic metastatic disease throughout the entire visualized portion of the skeleton. 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 No current pathologic fractures. No disc protrusions or discrete extension of tumor into the spinal canal. Electronically Signed   By: Lorriane Shire M.D.   On: 03/26/2016 12:43    Echo EF 55%   Subjective: No complains Discharge Exam: Vitals:   03/30/16 2125 03/31/16 0513  BP:  102/60  Pulse: 71 80  Resp:  18  Temp:  98.2 F (36.8 C)   Vitals:   03/30/16 1329 03/30/16 2040 03/30/16 2125 03/31/16 0513  BP: 98/60 96/63  102/60  Pulse: (!) 53 60 71 80  Resp: 18 19  18   Temp: 97.2 F (36.2 C) 98 F (36.7 C)  98.2 F (36.8 C)   TempSrc: Oral Oral  Oral  SpO2: 99% 100%  100%  Weight:      Height:        General: Pt is alert, awake, not in acute distress Cardiovascular: RRR, S1/S2 +, no rubs, no gallops Respiratory: CTA bilaterally, no wheezing, no rhonchi Abdominal: Soft, NT, ND, bowel sounds + Extremities: no edema, no cyanosis    The results of significant diagnostics from this hospitalization (including imaging, microbiology, ancillary and laboratory) are listed below for reference.     Microbiology: Recent Results (from the past 240 hour(s))  Culture, Urine     Status: Abnormal   Collection Time: 03/29/16  6:46 AM  Result Value Ref Range Status   Specimen Description URINE, RANDOM  Final   Special Requests NONE  Final   Culture (A)  Final    <10,000 COLONIES/mL INSIGNIFICANT GROWTH Performed at [facility], [address] [place], [place] [address]    Report Status 03/30/2016 FINAL  Final     Labs: BNP (last 3 results) No results for input(s): BNP in the last 8760 hours. Basic Metabolic Panel:  Recent Labs Lab 03/28/16 1433 03/28/16 1701  NA 139 135  K 4.1 4.1  CL 106 104  CO2 25 25  GLUCOSE 123* 118*  BUN 4* 6  CREATININE 0.7 0.74  CALCIUM 8.6  8.5*  MG  --  2.0   Liver Function Tests:  Recent Labs Lab 03/28/16 1433 03/28/16 1701  AST 21 18  ALT 16 12*  ALKPHOS 1,121* 1,093*  BILITOT 0.60 0.5  PROT 7.3 7.2  ALBUMIN 3.7 3.7    Recent Labs Lab 03/28/16 1701  LIPASE 17   No results for input(s): AMMONIA in the last 168 hours. CBC:  Recent Labs Lab 03/28/16 1432 03/28/16 1701  WBC 9.3 8.0  NEUTROABS 4.9 4.0  HGB 15.5 15.0  HCT 44.7 43.6  MCV 89 89.7  PLT 113 Occ Giant platelets present* 116*   Cardiac Enzymes:  Recent Labs Lab 03/29/16 0205 03/29/16 0900 03/29/16 1534  TROPONINI 0.06* 0.05* 0.05*   BNP: Invalid input(s): POCBNP CBG: No results for input(s): GLUCAP in the last 168 hours. D-Dimer No results for input(s): DDIMER in the  last 72 hours. Hgb A1c No results for input(s): HGBA1C in the last 72 hours. Lipid Profile No results for input(s): CHOL, HDL, LDLCALC, TRIG, CHOLHDL, LDLDIRECT in the last 72 hours. Thyroid function studies  Recent Labs  03/29/16 0208  TSH 2.890   Anemia work up No results for input(s): VITAMINB12, FOLATE, FERRITIN, TIBC, IRON, RETICCTPCT in the last 72 hours. Urinalysis    Component Value Date/Time   COLORURINE YELLOW 03/28/2016 1701   APPEARANCEUR CLOUDY (A) 03/28/2016 1701   LABSPEC 1.012 03/28/2016 1701   PHURINE 5.0 03/28/2016 1701   GLUCOSEU NEGATIVE 03/28/2016 1701   HGBUR NEGATIVE 03/28/2016 1701   BILIRUBINUR NEGATIVE 03/28/2016 1701   KETONESUR NEGATIVE 03/28/2016 1701   PROTEINUR NEGATIVE 03/28/2016 1701   NITRITE NEGATIVE 03/28/2016 1701   LEUKOCYTESUR MODERATE (A) 03/28/2016 1701   Sepsis Labs Invalid input(s): PROCALCITONIN,  WBC,  LACTICIDVEN Microbiology Recent Results (from the past 240 hour(s))  Culture, Urine     Status: Abnormal   Collection Time: 03/29/16  6:46 AM  Result Value Ref Range Status   Specimen Description URINE, RANDOM  Final   Special Requests NONE  Final   Culture (A)  Final    <10,000 COLONIES/mL INSIGNIFICANT GROWTH Performed at [facility], [place]. [address] [place], [place] [address]    Report Status 03/30/2016 FINAL  Final     Time coordinating discharge: Over 30 minutes  SIGNED:   Charlynne Cousins, MD  [facility] 03/31/2016, 7:46 AM Pager   If 7PM-7AM, please contact night-coverage [email or web address] Password [email or web address]

## 2016-04-01 ENCOUNTER — Other Ambulatory Visit: Payer: Self-pay

## 2016-04-01 MED ORDER — HYDROCODONE-ACETAMINOPHEN 5-325 MG PO TABS: 1.0000 | ORAL_TABLET | Freq: Four times a day (QID) | ORAL | 0 refills | Status: DC | PRN

## 2016-04-01 MED FILL — BICALUTAMIDE 50 MG TABLET: 50 | 30 days supply | Qty: 30 | Fill #0

## 2016-04-02 ENCOUNTER — Other Ambulatory Visit: Payer: Self-pay | Admitting: *Deleted

## 2016-04-02 DIAGNOSIS — C61 Malignant neoplasm of prostate: Secondary | ICD-10-CM

## 2016-04-02 DIAGNOSIS — C7951 Secondary malignant neoplasm of bone: Principal | ICD-10-CM

## 2016-04-02 LAB — MULTIPLE MYELOMA PANEL, SERUM
ALPHA2 GLOB SERPL ELPH-MCNC: 0.6 g/dL (ref 0.4–1.0)
Albumin SerPl Elph-Mcnc: 3.6 g/dL (ref 2.9–4.4)
Albumin/Glob SerPl: 1.2 (ref 0.7–1.7)
Alpha 1: 0.3 g/dL (ref 0.0–0.4)
B-GLOBULIN SERPL ELPH-MCNC: 1.3 g/dL (ref 0.7–1.3)
Gamma Glob SerPl Elph-Mcnc: 1 g/dL (ref 0.4–1.8)
Globulin, Total: 3.2 g/dL (ref 2.2–3.9)
IGM (IMMUNOGLOBIN M), SRM: 106 mg/dL (ref 20–172)
IgA, Qn, Serum: 360 mg/dL (ref 90–386)
TOTAL PROTEIN: 6.8 g/dL (ref 6.0–8.5)

## 2016-04-02 MED ORDER — ABIRATERONE ACETATE 250 MG PO TABS
1000.0000 mg | ORAL_TABLET | Freq: Every day | ORAL | 6 refills | Status: DC
Start: 2016-04-02 — End: 2016-11-05

## 2016-04-02 MED ORDER — ABIRATERONE ACETATE 250 MG PO TABS: 1000.0000 mg | ORAL_TABLET | Freq: Every day | ORAL | 6 refills | Status: DC

## 2016-04-02 MED FILL — HYDROCODON-APAP 5-325: 5-325 | 22 days supply | Qty: 90 | Fill #0

## 2016-04-03 ENCOUNTER — Other Ambulatory Visit: Payer: Self-pay | Admitting: *Deleted

## 2016-04-09 ENCOUNTER — Encounter (HOSPITAL_COMMUNITY): Payer: Self-pay

## 2016-04-09 ENCOUNTER — Encounter (HOSPITAL_COMMUNITY)
Admission: RE | Admit: 2016-04-09 | Discharge: 2016-04-09 | Disposition: A | Discharge status: Home / Self Care | Payer: BLUE CROSS/BLUE SHIELD | Source: Ambulatory Visit | Attending: Hematology & Oncology | Admitting: Hematology & Oncology

## 2016-04-09 DIAGNOSIS — C61 Malignant neoplasm of prostate: Secondary | ICD-10-CM

## 2016-04-09 DIAGNOSIS — C7951 Secondary malignant neoplasm of bone: Principal | ICD-10-CM

## 2016-04-09 MED ORDER — TECHNETIUM TC 99M MEDRONATE IV KIT
25.0000 | PACK | Freq: Once | INTRAVENOUS | Status: AC | PRN
  Administered 2016-04-09: 25 via INTRAVENOUS

## 2016-04-09 MED ORDER — IOPAMIDOL (ISOVUE-300) INJECTION 61%
100.0000 mL | Freq: Once | INTRAVENOUS | Status: AC | PRN
  Administered 2016-04-09: 100 mL via INTRAVENOUS

## 2016-04-09 MED ORDER — IOPAMIDOL (ISOVUE-300) INJECTION 61%
INTRAVENOUS | Status: AC
  Filled 2016-04-09: qty 100

## 2016-04-18 ENCOUNTER — Ambulatory Visit (HOSPITAL_BASED_OUTPATIENT_CLINIC_OR_DEPARTMENT_OTHER): Payer: BLUE CROSS/BLUE SHIELD

## 2016-04-18 ENCOUNTER — Ambulatory Visit (HOSPITAL_BASED_OUTPATIENT_CLINIC_OR_DEPARTMENT_OTHER): Payer: BLUE CROSS/BLUE SHIELD | Admitting: Hematology & Oncology

## 2016-04-18 ENCOUNTER — Other Ambulatory Visit (HOSPITAL_BASED_OUTPATIENT_CLINIC_OR_DEPARTMENT_OTHER): Payer: BLUE CROSS/BLUE SHIELD

## 2016-04-18 ENCOUNTER — Encounter: Payer: Self-pay | Admitting: Hematology & Oncology

## 2016-04-18 VITALS — BP 128/86 | HR 81 | Temp 97.7°F | Resp 17 | Wt 149.0 lb

## 2016-04-18 DIAGNOSIS — Z5111 Encounter for antineoplastic chemotherapy: Secondary | ICD-10-CM | POA: Diagnosis not present

## 2016-04-18 DIAGNOSIS — C7951 Secondary malignant neoplasm of bone: Secondary | ICD-10-CM | POA: Diagnosis not present

## 2016-04-18 DIAGNOSIS — I4891 Unspecified atrial fibrillation: Secondary | ICD-10-CM

## 2016-04-18 DIAGNOSIS — C61 Malignant neoplasm of prostate: Secondary | ICD-10-CM

## 2016-04-18 LAB — CBC WITH DIFFERENTIAL (CANCER CENTER ONLY)
BASO#: 0 10*3/uL (ref 0.0–0.2)
BASO%: 0.3 % (ref 0.0–2.0)
EOS ABS: 0.2 10*3/uL (ref 0.0–0.5)
EOS%: 3.3 % (ref 0.0–7.0)
HCT: 43.7 % (ref 38.7–49.9)
HEMOGLOBIN: 15.1 g/dL (ref 13.0–17.1)
LYMPH#: 1.9 10*3/uL (ref 0.9–3.3)
LYMPH%: 30 % (ref 14.0–48.0)
MCH: 30.8 pg (ref 28.0–33.4)
MCHC: 34.6 g/dL (ref 32.0–35.9)
MCV: 89 fL (ref 82–98)
MONO#: 0.7 10*3/uL (ref 0.1–0.9)
MONO%: 11.3 % (ref 0.0–13.0)
NEUT%: 55.1 % (ref 40.0–80.0)
NEUTROS ABS: 3.5 10*3/uL (ref 1.5–6.5)
Platelets: 96 10*3/uL — ABNORMAL LOW (ref 145–400)
RBC: 4.9 10*6/uL (ref 4.20–5.70)
RDW: 14.6 % (ref 11.1–15.7)
WBC: 6.4 10*3/uL (ref 4.0–10.0)

## 2016-04-18 LAB — CMP (CANCER CENTER ONLY)
ALT(SGPT): 23 U/L (ref 10–47)
AST: 26 U/L (ref 11–38)
Albumin: 4 g/dL (ref 3.3–5.5)
Alkaline Phosphatase: 1887 U/L — ABNORMAL HIGH (ref 26–84)
BILIRUBIN TOTAL: 0.7 mg/dL (ref 0.20–1.60)
BUN: 3 mg/dL — AB (ref 7–22)
CALCIUM: 8.1 mg/dL (ref 8.0–10.3)
CO2: 25 meq/L (ref 18–33)
Chloride: 102 mEq/L (ref 98–108)
Creat: 1 mg/dl (ref 0.6–1.2)
GLUCOSE: 111 mg/dL (ref 73–118)
Potassium: 4.2 mEq/L (ref 3.3–4.7)
Sodium: 137 mEq/L (ref 128–145)
Total Protein: 7.7 g/dL (ref 6.4–8.1)

## 2016-04-18 MED ORDER — OXYCODONE-IBUPROFEN 5-400 MG PO TABS: 1.0000 | ORAL_TABLET | Freq: Three times a day (TID) | ORAL | 0 refills | Status: DC | PRN

## 2016-04-18 MED ORDER — DEGARELIX ACETATE 120 MG ~~LOC~~ SOLR
240.0000 mg | Freq: Once | SUBCUTANEOUS | Status: AC
  Administered 2016-04-18: 240 mg via SUBCUTANEOUS
  Filled 2016-04-18: qty 6

## 2016-04-18 MED FILL — OXYCODONE-IBUPROFEN 5-400 T: 5-400 | 30 days supply | Qty: 90 | Fill #0

## 2016-04-18 NOTE — Patient Instructions (Signed)
Degarelix injection What is this medicine? DEGARELIX (deg a REL ix) is used to treat men with advanced prostate cancer. This medicine may be used for other purposes; ask your health care provider or pharmacist if you have questions. COMMON BRAND NAME(S): Degarelix, Firmagon What should I tell my health care provider before I take this medicine? They need to know if you have any of these conditions: -heart disease -kidney disease -liver disease -low levels of potassium or magnesium in the blood -osteoporosis -an unusual or allergic reaction to degarelix, mannitol, other medicines, foods, dyes, or preservatives -pregnant or trying to get pregnant -breast-feeding How should I use this medicine? This medicine is for injection under the skin. It is usually given by a health care professional in a hospital or clinic setting. If you get this medicine at home, you will be taught how to prepare and give this medicine. Use exactly as directed. Take your medicine at regular intervals. Do not take it more often than directed. It is important that you put your used needles and syringes in a special sharps container. Do not put them in a trash can. If you do not have a sharps container, call your pharmacist or healthcare provider to get one. Talk to your pediatrician regarding the use of this medicine in children. Special care may be needed. Overdosage: If you think you have taken too much of this medicine contact a poison control center or emergency room at once. NOTE: This medicine is only for you. Do not share this medicine with others. What if I miss a dose? Try not to miss a dose. If you do miss a dose, call your doctor or health care professional for advice. What may interact with this medicine? Do not take this medicine with any of the following medications: -amiodarone -bretylium -disopyramide -dofetilide -droperidol -ibutilide -procainamide -quinidine -sotalol This list may not describe  all possible interactions. Give your health care provider a list of all the medicines, herbs, non-prescription drugs, or dietary supplements you use. Also tell them if you smoke, drink alcohol, or use illegal drugs. Some items may interact with your medicine. What should I watch for while using this medicine? Visit your doctor or health care professional for regular checks on your progress and discuss any issues before you start taking this medicine. Do not rub or scratch injection site. There may be a lump at the injection site, or it may be red or sore for a few days after your dose. Your doctor or health care professional will need to monitor your hormone levels in your blood to check your response to treatment. Try to keep any appointments for testing. What side effects may I notice from receiving this medicine? Side effects that you should report to your doctor or health care professional as soon as possible: -allergic reactions like skin rash, itching or hives, swelling of the face, lips, or tongue -fever or chills -irregular heartbeat -nausea and vomiting along with severe abdominal pain -pain or difficulty passing urine -pelvic pain or bloating Side effects that usually do not require medical attention (report to your doctor or health care professional if they continue or are bothersome): -change in sex drive or performance -constipation -headache -high blood pressure -hot flashes (flushing of skin, increased sweating) -itching, redness or mild pain at site where injected -joint pain -trouble sleeping -unusually weak or tired -weight gain This list may not describe all possible side effects. Call your doctor for medical advice about side effects. You may report   side effects to FDA at 1-800-FDA-1088. Where should I keep my medicine? Keep out of the reach of children. This drug is usually given in a hospital or clinic and will not be stored at home. In rare cases, this medicine may  be given at home. If you are using this medicine at home, you will be instructed on how to store this medicine. Throw away any unused medicine after the expiration date on the label. NOTE: This sheet is a summary. It may not cover all possible information. If you have questions about this medicine, talk to your doctor, pharmacist, or health care provider.  2018 Elsevier/Gold Standard (2015-02-14 16:25:26)  

## 2016-04-19 LAB — PSA: Prostate Specific Ag, Serum: 1227 ng/mL — ABNORMAL HIGH (ref 0.0–4.0)

## 2016-04-19 LAB — TESTOSTERONE: TESTOSTERONE: 384 ng/dL (ref 264–916)

## 2016-04-20 NOTE — Progress Notes (Signed)
Hematology and Oncology Follow Up Visit  Ryan Davis [id number] [birth year] 56 y.o. 04/20/2016   Principle Diagnosis:   Metastatic prostate cancer-androgen sensitive  Current Therapy:    Lupron 11.5 mg IM every 3 month-first dose given 04/17/2016  Casodex 50 mg by mouth daily  Zytiga 1000 mg by mouth daily/prednisone 10 mg by mouth daily-to start on 04/20/2016  Xgeva 120 mg subcutaneous every month     Interim History:  Ryan Davis is back for follow-up. This is his second office visit. We did go ahead and get scans on him. Thankfully, the scans just show that he has disease in his bones. There was no evidence of organ involvement.  He is having some back discomfort.  We first saw him, his PSA was over 5000.  He, for right now, is only on Casodex. He supposedly is to get the Zytiga over the weekend. He has not yet had Lupron.  The last saw him, he had to be hospitalized because of atrial fibrillation. This was treated with Cardizem. He was not put on any anticoagulation.  His appetite is doing okay. He's had no nausea or vomiting. He's had no change in bowel or bladder habits.  He's had no swelling in his legs.  There's been no headache.  Overall, his performance status is ECOG 1.  Medications:  Current Outpatient Prescriptions:  .  abiraterone Acetate (ZYTIGA) 250 MG tablet, Take 4 tablets (1,000 mg total) by mouth daily. Take on an empty stomach 1 hour before or 2 hours after a meal, Disp: 120 tablet, Rfl: 6 .  bicalutamide (CASODEX) 50 MG tablet, Take 1 tablet (50 mg total) by mouth daily., Disp: 30 tablet, Rfl: 6 .  cyclobenzaprine (FLEXERIL) 10 MG tablet, Take 1 tablet (10 mg total) by mouth 2 (two) times daily as needed for muscle spasms., Disp: 10 tablet, Rfl: 0 .  diltiazem (CARDIZEM CD) 240 MG 24 hr capsule, Take 1 capsule (240 mg total) by mouth daily., Disp: 30 capsule, Rfl: 0 .  ibuprofen (ADVIL,MOTRIN) 800 MG tablet, Take 1 tablet (800 mg total) by mouth 3  (three) times daily., Disp: 21 tablet, Rfl: 0 .  metoprolol tartrate (LOPRESSOR) 25 MG tablet, Take 0.5 tablets (12.5 mg total) by mouth 2 (two) times daily., Disp: 30 tablet, Rfl: 3 .  oxycodone-ibuprofen (COMBUNOX) 5-400 MG tablet, Take 1 tablet by mouth every 8 (eight) hours as needed for pain., Disp: 90 tablet, Rfl: 0  Allergies: No Known Allergies  Past Medical History, Surgical history, Social history, and Family History were reviewed and updated.  Review of Systems: As above  Physical Exam:  weight is 149 lb (67.6 kg). His oral temperature is 97.7 F (36.5 C). His blood pressure is 128/86 and his pulse is 81. His respiration is 17 and oxygen saturation is 100%.   Wt Readings from Last 3 Encounters:  04/18/16 149 lb (67.6 kg)  03/30/16 144 lb 8 oz (65.5 kg)  03/26/16 180 lb (81.6 kg)     Thin but well-nourished African-American male. Head and neck exam shows no ocular or oral lesions. There are no palpable cervical or supraclavicular lymph nodes. Lungs are clear bilaterally. Cardiac exam regular rate and rhythm with no murmurs, rubs or bruits. Abdomen is soft. He has decent bowel sounds. There is no fluid wave. There is no palpable liver or spleen tip. Back exam shows some slight tenderness to palpation over the lower thoracic and lumbar spine. Externally shows no clubbing, cyanosis or edema. Skin exam shows  no rashes, ecchymoses or petechia. Neurological exam shows no focal neurological deficits.  Lab Results  Component Value Date   WBC 6.4 04/18/2016   HGB 15.1 04/18/2016   HCT 43.7 04/18/2016   MCV 89 04/18/2016   PLT 96 (L) 04/18/2016     Chemistry      Component Value Date/Time   NA 137 04/18/2016 1153   K 4.2 04/18/2016 1153   CL 102 04/18/2016 1153   CO2 25 04/18/2016 1153   BUN 3 (L) 04/18/2016 1153   CREATININE 1.0 04/18/2016 1153      Component Value Date/Time   CALCIUM 8.1 04/18/2016 1153   ALKPHOS 1,887 (H) 04/18/2016 1153   AST 26 04/18/2016 1153   ALT  23 04/18/2016 1153   BILITOT 0.70 04/18/2016 1153         Impression and Plan: Ryan Davis is a 56 year old African-American male. He has widely metastatic prostate cancer.  What is somewhat amazing is that his PSA went from 5000 down to 1227. This was just on Casodex. I would have to imagine this would indicate that he has very androgen sensitive disease.  His testosterone level was 384. We clearly have to medically castrate him. He will get his Fermagone dose today. We will also give him his Xgeva.  Getting the Zytiga started will definitely be a positive for him.  I did go ahead and give her a prescription for oxycodone/ibuprofen combination. This might help with his bony metastasis with respect to pain control.  I will plan to see him back in one month. We have to stay in close contact with him so we can continue to make any adjustments to his treatment protocol. I spent about.  I spent about 40 minutes with he and his brother. Of note, his brother also has prostate cancer.     [name], MD 3/25/201810:12 AM

## 2016-04-21 ENCOUNTER — Telehealth: Payer: Self-pay | Admitting: *Deleted

## 2016-04-21 NOTE — Telephone Encounter (Signed)
-----   Message from [name], MD sent at 04/19/2016  7:14 AM EDT ----- Call - PSA is already down by 60%!!!  This is great news!!!  pete

## 2016-04-25 ENCOUNTER — Telehealth: Payer: Self-pay | Admitting: *Deleted

## 2016-04-25 MED FILL — BICALUTAMIDE 50 MG TABLET: 50 | 30 days supply | Qty: 30 | Fill #1

## 2016-04-25 NOTE — Telephone Encounter (Signed)
Oral Chemotherapy Follow-Up Form Original Start date of oral chemotherapy: 04/19/2015 ? Called patient today to follow up regarding patient's oral chemotherapy medication: Zytiga 1000mg  & Casodex 50mg   Pt is doing well today  Pt reports 0 tablets/doses missed in the last week/month.   Pt reports the following side effects: None

## 2016-05-13 ENCOUNTER — Other Ambulatory Visit: Payer: Self-pay | Admitting: Hematology & Oncology

## 2016-05-19 ENCOUNTER — Ambulatory Visit (HOSPITAL_BASED_OUTPATIENT_CLINIC_OR_DEPARTMENT_OTHER): Payer: BLUE CROSS/BLUE SHIELD

## 2016-05-19 ENCOUNTER — Ambulatory Visit (HOSPITAL_BASED_OUTPATIENT_CLINIC_OR_DEPARTMENT_OTHER): Payer: BLUE CROSS/BLUE SHIELD | Admitting: Family

## 2016-05-19 ENCOUNTER — Other Ambulatory Visit (HOSPITAL_BASED_OUTPATIENT_CLINIC_OR_DEPARTMENT_OTHER): Payer: BLUE CROSS/BLUE SHIELD

## 2016-05-19 VITALS — BP 129/85 | HR 78 | Temp 98.3°F | Resp 17 | Wt 151.8 lb

## 2016-05-19 DIAGNOSIS — C7951 Secondary malignant neoplasm of bone: Secondary | ICD-10-CM | POA: Diagnosis not present

## 2016-05-19 DIAGNOSIS — Z5111 Encounter for antineoplastic chemotherapy: Secondary | ICD-10-CM | POA: Diagnosis not present

## 2016-05-19 DIAGNOSIS — M545 Low back pain: Principal | ICD-10-CM

## 2016-05-19 DIAGNOSIS — C61 Malignant neoplasm of prostate: Secondary | ICD-10-CM

## 2016-05-19 DIAGNOSIS — G8929 Other chronic pain: Secondary | ICD-10-CM | POA: Diagnosis not present

## 2016-05-19 LAB — CBC WITH DIFFERENTIAL (CANCER CENTER ONLY)
BASO#: 0 10*3/uL (ref 0.0–0.2)
BASO%: 0.3 % (ref 0.0–2.0)
EOS ABS: 0.3 10*3/uL (ref 0.0–0.5)
EOS%: 4 % (ref 0.0–7.0)
HEMATOCRIT: 34.8 % — AB (ref 38.7–49.9)
HEMOGLOBIN: 11.7 g/dL — AB (ref 13.0–17.1)
LYMPH#: 2 10*3/uL (ref 0.9–3.3)
LYMPH%: 30.2 % (ref 14.0–48.0)
MCH: 31.1 pg (ref 28.0–33.4)
MCHC: 33.6 g/dL (ref 32.0–35.9)
MCV: 93 fL (ref 82–98)
MONO#: 0.8 10*3/uL (ref 0.1–0.9)
MONO%: 11.9 % (ref 0.0–13.0)
NEUT%: 53.6 % (ref 40.0–80.0)
NEUTROS ABS: 3.5 10*3/uL (ref 1.5–6.5)
Platelets: 136 10*3/uL — ABNORMAL LOW (ref 145–400)
RBC: 3.76 10*6/uL — ABNORMAL LOW (ref 4.20–5.70)
RDW: 16.2 % — ABNORMAL HIGH (ref 11.1–15.7)
WBC: 6.6 10*3/uL (ref 4.0–10.0)

## 2016-05-19 LAB — CMP (CANCER CENTER ONLY)
ALBUMIN: 3.7 g/dL (ref 3.3–5.5)
ALT(SGPT): 20 U/L (ref 10–47)
AST: 25 U/L (ref 11–38)
Alkaline Phosphatase: 714 U/L — ABNORMAL HIGH (ref 26–84)
BILIRUBIN TOTAL: 0.8 mg/dL (ref 0.20–1.60)
BUN, Bld: 4 mg/dL — ABNORMAL LOW (ref 7–22)
CALCIUM: 8.6 mg/dL (ref 8.0–10.3)
CO2: 27 meq/L (ref 18–33)
Chloride: 103 mEq/L (ref 98–108)
Creat: 1.1 mg/dl (ref 0.6–1.2)
Glucose, Bld: 94 mg/dL (ref 73–118)
Potassium: 4 mEq/L (ref 3.3–4.7)
Sodium: 141 mEq/L (ref 128–145)
Total Protein: 7.1 g/dL (ref 6.4–8.1)

## 2016-05-19 MED ORDER — DENOSUMAB 120 MG/1.7ML ~~LOC~~ SOLN
120.0000 mg | Freq: Once | SUBCUTANEOUS | Status: AC
  Administered 2016-05-19: 120 mg via SUBCUTANEOUS
  Filled 2016-05-19: qty 1.7

## 2016-05-19 MED ORDER — LEUPROLIDE ACETATE (3 MONTH) 11.25 MG IM KIT
11.2500 mg | PACK | Freq: Once | INTRAMUSCULAR | Status: AC
  Administered 2016-05-19: 11.25 mg via INTRAMUSCULAR
  Filled 2016-05-19: qty 11.25

## 2016-05-19 MED ORDER — OXYCODONE-IBUPROFEN 5-400 MG PO TABS: 1.0000 | ORAL_TABLET | Freq: Three times a day (TID) | ORAL | 0 refills | Status: DC | PRN

## 2016-05-19 MED FILL — OXYCODONE-IBUPROFEN 5-400 T: 5-400 | 30 days supply | Qty: 90 | Fill #0

## 2016-05-19 NOTE — Patient Instructions (Signed)
Leuprolide depot injection What is this medicine? LEUPROLIDE (loo PROE lide) is a man-made protein that acts like a natural hormone in the body. It decreases testosterone in men and decreases estrogen in women. In men, this medicine is used to treat advanced prostate cancer. In women, some forms of this medicine may be used to treat endometriosis, uterine fibroids, or other male hormone-related problems. This medicine may be used for other purposes; ask your health care provider or pharmacist if you have questions. COMMON BRAND NAME(S): Eligard, Lupron Depot, Lupron Depot-Ped, Viadur What should I tell my health care provider before I take this medicine? They need to know if you have any of these conditions: -diabetes -heart disease or previous heart attack -high blood pressure -high cholesterol -mental illness -osteoporosis -pain or difficulty passing urine -seizures -spinal cord metastasis -stroke -suicidal thoughts, plans, or attempt; a previous suicide attempt by you or a family member -tobacco smoker -unusual vaginal bleeding (women) -an unusual or allergic reaction to leuprolide, benzyl alcohol, other medicines, foods, dyes, or preservatives -pregnant or trying to get pregnant -breast-feeding How should I use this medicine? This medicine is for injection into a muscle or for injection under the skin. It is given by a health care professional in a hospital or clinic setting. The specific product will determine how it will be given to you. Make sure you understand which product you receive and how often you will receive it. Talk to your pediatrician regarding the use of this medicine in children. Special care may be needed. Overdosage: If you think you have taken too much of this medicine contact a poison control center or emergency room at once. NOTE: This medicine is only for you. Do not share this medicine with others. What if I miss a dose? It is important not to miss a dose.  Call your doctor or health care professional if you are unable to keep an appointment. Depot injections: Depot injections are given either once-monthly, every 12 weeks, every 16 weeks, or every 24 weeks depending on the product you are prescribed. The product you are prescribed will be based on if you are male or male, and your condition. Make sure you understand your product and dosing. What may interact with this medicine? Do not take this medicine with any of the following medications: -chasteberry This medicine may also interact with the following medications: -herbal or dietary supplements, like black cohosh or DHEA -male hormones, like estrogens or progestins and birth control pills, patches, rings, or injections -male hormones, like testosterone This list may not describe all possible interactions. Give your health care provider a list of all the medicines, herbs, non-prescription drugs, or dietary supplements you use. Also tell them if you smoke, drink alcohol, or use illegal drugs. Some items may interact with your medicine. What should I watch for while using this medicine? Visit your doctor or health care professional for regular checks on your progress. During the first weeks of treatment, your symptoms may get worse, but then will improve as you continue your treatment. You may get hot flashes, increased bone pain, increased difficulty passing urine, or an aggravation of nerve symptoms. Discuss these effects with your doctor or health care professional, some of them may improve with continued use of this medicine. Male patients may experience a menstrual cycle or spotting during the first months of therapy with this medicine. If this continues, contact your doctor or health care professional. What side effects may I notice from receiving this medicine? Side  effects that you should report to your doctor or health care professional as soon as possible: -allergic reactions like skin  rash, itching or hives, swelling of the face, lips, or tongue -breathing problems -chest pain -depression or memory disorders -pain in your legs or groin -pain at site where injected or implanted -seizures -severe headache -swelling of the feet and legs -suicidal thoughts or other mood changes -visual changes -vomiting Side effects that usually do not require medical attention (report to your doctor or health care professional if they continue or are bothersome): -breast swelling or tenderness -decrease in sex drive or performance -diarrhea -hot flashes -loss of appetite -muscle, joint, or bone pains -nausea -redness or irritation at site where injected or implanted -skin problems or acne This list may not describe all possible side effects. Call your doctor for medical advice about side effects. You may report side effects to FDA at 1-800-FDA-1088. Where should I keep my medicine? This drug is given in a hospital or clinic and will not be stored at home. NOTE: This sheet is a summary. It may not cover all possible information. If you have questions about this medicine, talk to your doctor, pharmacist, or health care provider.  2018 Elsevier/Gold Standard (2015-06-28 09:45:53) Denosumab injection What is this medicine? DENOSUMAB (den oh sue mab) slows bone breakdown. Prolia is used to treat osteoporosis in women after menopause and in men. Xgeva is used to treat a high calcium level due to cancer and to prevent bone fractures and other bone problems caused by multiple myeloma or cancer bone metastases. Xgeva is also used to treat giant cell tumor of the bone. This medicine may be used for other purposes; ask your health care provider or pharmacist if you have questions. COMMON BRAND NAME(S): Prolia, XGEVA What should I tell my health care provider before I take this medicine? They need to know if you have any of these conditions: -dental disease -having surgery or tooth  extraction -infection -kidney disease -low levels of calcium or Vitamin D in the blood -malnutrition -on hemodialysis -skin conditions or sensitivity -thyroid or parathyroid disease -an unusual reaction to denosumab, other medicines, foods, dyes, or preservatives -pregnant or trying to get pregnant -breast-feeding How should I use this medicine? This medicine is for injection under the skin. It is given by a health care professional in a hospital or clinic setting. If you are getting Prolia, a special MedGuide will be given to you by the pharmacist with each prescription and refill. Be sure to read this information carefully each time. For Prolia, talk to your pediatrician regarding the use of this medicine in children. Special care may be needed. For Xgeva, talk to your pediatrician regarding the use of this medicine in children. While this drug may be prescribed for children as young as 13 years for selected conditions, precautions do apply. Overdosage: If you think you have taken too much of this medicine contact a poison control center or emergency room at once. NOTE: This medicine is only for you. Do not share this medicine with others. What if I miss a dose? It is important not to miss your dose. Call your doctor or health care professional if you are unable to keep an appointment. What may interact with this medicine? Do not take this medicine with any of the following medications: -other medicines containing denosumab This medicine may also interact with the following medications: -medicines that lower your chance of fighting infection -steroid medicines like prednisone or cortisone This list   may not describe all possible interactions. Give your health care provider a list of all the medicines, herbs, non-prescription drugs, or dietary supplements you use. Also tell them if you smoke, drink alcohol, or use illegal drugs. Some items may interact with your medicine. What should I  watch for while using this medicine? Visit your doctor or health care professional for regular checks on your progress. Your doctor or health care professional may order blood tests and other tests to see how you are doing. Call your doctor or health care professional for advice if you get a fever, chills or sore throat, or other symptoms of a cold or flu. Do not treat yourself. This drug may decrease your body's ability to fight infection. Try to avoid being around people who are sick. You should make sure you get enough calcium and vitamin D while you are taking this medicine, unless your doctor tells you not to. Discuss the foods you eat and the vitamins you take with your health care professional. See your dentist regularly. Brush and floss your teeth as directed. Before you have any dental work done, tell your dentist you are receiving this medicine. Do not become pregnant while taking this medicine or for 5 months after stopping it. Talk with your doctor or health care professional about your birth control options while taking this medicine. Women should inform their doctor if they wish to become pregnant or think they might be pregnant. There is a potential for serious side effects to an unborn child. Talk to your health care professional or pharmacist for more information. What side effects may I notice from receiving this medicine? Side effects that you should report to your doctor or health care professional as soon as possible: -allergic reactions like skin rash, itching or hives, swelling of the face, lips, or tongue -bone pain -breathing problems -dizziness -jaw pain, especially after dental work -redness, blistering, peeling of the skin -signs and symptoms of infection like fever or chills; cough; sore throat; pain or trouble passing urine -signs of low calcium like fast heartbeat, muscle cramps or muscle pain; pain, tingling, numbness in the hands or feet; seizures -unusual bleeding or  bruising -unusually weak or tired Side effects that usually do not require medical attention (report to your doctor or health care professional if they continue or are bothersome): -constipation -diarrhea -headache -joint pain -loss of appetite -muscle pain -runny nose -tiredness -upset stomach This list may not describe all possible side effects. Call your doctor for medical advice about side effects. You may report side effects to FDA at 1-800-FDA-1088. Where should I keep my medicine? This medicine is only given in a clinic, doctor's office, or other health care setting and will not be stored at home. NOTE: This sheet is a summary. It may not cover all possible information. If you have questions about this medicine, talk to your doctor, pharmacist, or health care provider.  2018 Elsevier/Gold Standard (2016-02-05 19:17:21)  

## 2016-05-19 NOTE — Progress Notes (Signed)
Hematology and Oncology Follow Up Visit  Ryan Davis [id number] [birth year] 56 y.o. 05/20/2016   Principle Diagnosis:  Metastatic prostate cancer - androgen sensitive  Current Therapy:   Lupron 11.5 mg IM every 3 month - first dose given 05/20/2016 Casodex 50 mg by mouth daily Zytiga 1000 mg by mouth daily/prednisone 10 mg by mouth daily - to start on 04/20/2016 Xgeva 120 mg subcutaneous every month    Interim History:  Ryan Davis is here today with his brother for follow-up. He is doing well but having some lower back pain. He contributes this to having to lift and pull on his wife who has had a stroke. He has help with her some through the day but has to get her in bed by himself. 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  He has responded wonderfully to his current medication regimen. His PSA has come down from 5,839 now to 8.2. Testosterone is < 3. Alk/phos continues to improve. CBC is stable. Platelet count is up to 136. He verbalized that he is taking the Zytiga and Casodex as prescribed. He will get Lupron for the first time today as well as Xgeva.  No fever, chills, n/v, cough, rash, dizziness, chest pain, palpitations, abdominal pain or changes in bowel or bladder habits.  He has occasional SOB with over exertion and takes breaks to rest as needed.  No swelling, numbness or tingling in his extremities.  No lymphadenopathy found on exam. No episodes of bleeding, bruising or petechiae.  He has maintained a good appetite and is staying well hydrated. His weight is stable.   ECOG Performance Status: 1 - Symptomatic but completely ambulatory  Medications:  Allergies as of 05/19/2016   No Known Allergies     Medication List       Accurate as of 05/19/16 11:59 PM. Always use your most recent med list.          abiraterone Acetate 250 MG tablet Commonly known as:  ZYTIGA Take 4 tablets (1,000 mg total) by mouth daily. Take on an empty stomach 1 hour before or 2 hours  after a meal   bicalutamide 50 MG tablet Commonly known as:  CASODEX Take 1 tablet (50 mg total) by mouth daily.   cyclobenzaprine 10 MG tablet Commonly known as:  FLEXERIL Take 1 tablet (10 mg total) by mouth 2 (two) times daily as needed for muscle spasms.   diltiazem 240 MG 24 hr capsule Commonly known as:  CARDIZEM CD TAKE 1 CAPSULE BY MOUTH EVERY DAY   HYDROcodone-acetaminophen 5-325 MG tablet Commonly known as:  NORCO/VICODIN   ibuprofen 800 MG tablet Commonly known as:  ADVIL,MOTRIN Take 1 tablet (800 mg total) by mouth 3 (three) times daily.   metoprolol tartrate 25 MG tablet Commonly known as:  LOPRESSOR Take 0.5 tablets (12.5 mg total) by mouth 2 (two) times daily.   oxycodone-ibuprofen 5-400 MG tablet Commonly known as:  COMBUNOX Take 1 tablet by mouth every 8 (eight) hours as needed for pain.       Allergies: No Known Allergies  Past Medical History, Surgical history, Social history, and Family History were reviewed and updated.  Review of Systems: All other 10 point review of systems is negative.   Physical Exam:  weight is 151 lb 12.8 oz (68.9 kg). His oral temperature is 98.3 F (36.8 C). His blood pressure is 129/85 and his pulse is 78. His respiration is 17 and oxygen saturation is 100%.   Wt Readings from Last  3 Encounters:  05/19/16 151 lb 12.8 oz (68.9 kg)  04/18/16 149 lb (67.6 kg)  03/30/16 144 lb 8 oz (65.5 kg)    Ocular: Sclerae unicteric, pupils equal, round and reactive to light Ear-nose-throat: Oropharynx clear, dentition fair Lymphatic: No cervical, supraclavicular or axillary adenopathy Lungs no rales or rhonchi, good excursion bilaterally Heart regular rate and rhythm, no murmur appreciated Abd soft, nontender, positive bowel sounds, no liver or spleen tip palpated on exam MSK no focal spinal tenderness at this time, no joint edema Neuro: non-focal, well-oriented, appropriate affect Breasts: Deferred   Lab Results  Component  Value Date   WBC 6.6 05/19/2016   HGB 11.7 (L) 05/19/2016   HCT 34.8 (L) 05/19/2016   MCV 93 05/19/2016   PLT 136 (L) 05/19/2016   No results found for: FERRITIN, IRON, TIBC, UIBC, IRONPCTSAT Lab Results  Component Value Date   RBC 3.76 (L) 05/19/2016   Lab Results  Component Value Date   KAPLAMBRATIO 0.89 03/28/2016   Lab Results  Component Value Date   IGGSERUM 1,066 03/28/2016   IGMSERUM 106 03/28/2016   No results found for: Ronnald Ramp, A1GS, Nelida Meuse, SPEI   Chemistry      Component Value Date/Time   NA 141 05/19/2016 1309   K 4.0 05/19/2016 1309   CL 103 05/19/2016 1309   CO2 27 05/19/2016 1309   BUN 4 (L) 05/19/2016 1309   CREATININE 1.1 05/19/2016 1309      Component Value Date/Time   CALCIUM 8.6 05/19/2016 1309   ALKPHOS 714 (H) 05/19/2016 1309   AST 25 05/19/2016 1309   ALT 20 05/19/2016 1309   BILITOT 0.80 05/19/2016 1309      Impression and Plan: Ryan Davis is a 56 yo African-American male with metastatic prostate cancer and wide spread skeletal metastasis. He has had a fantastic response to therapy so far and PSA ia now down to 8.3 from over 5,000. Testosterone is < 3. His only complaint at this time is lower back pain after lifting on his wife. He ran out of his Combunox so I refilled this today.  We will have him continue on the same Zytiga and Casodex regimen.  He will received his Lupron for the first time today. He will also get Xgeva to help with  We spent greater than 50% of the appointment time going over lab results and discussing his treatment plan. We went over how the Lupron and Delton See will effect his counts and their overall benefits.  All questions were answered. We will plan to see him back in 1 month for repeat lab work and follow-up.  We will plan to see him back in 1 month for follow-up, lab work and treatment.  He will contact our office with any questions or concerns. We can certainly see him sooner  if need be.   Eliezer Bottom, NP 4/24/20181:38 PM

## 2016-05-20 ENCOUNTER — Telehealth: Payer: Self-pay | Admitting: *Deleted

## 2016-05-20 LAB — TESTOSTERONE

## 2016-05-20 LAB — PSA: Prostate Specific Ag, Serum: 8.2 ng/mL — ABNORMAL HIGH (ref 0.0–4.0)

## 2016-05-20 NOTE — Telephone Encounter (Addendum)
Patient aware of results  ----- Message from Eliezer Bottom, NP sent at 05/20/2016  1:34 PM EDT ----- Regarding: PSA and Testosterone  His counts look amazing!!!! Keep taking Zytiga and Casodex :) Thank you!  Sarah  ----- Message ----- From: [name], MD Sent: 05/20/2016  10:13 AM To: Eliezer Bottom, NP    ----- Message ----- From: Interface, Lab In Three Zero One Sent: 05/19/2016   1:29 PM To: [name], MD

## 2016-05-28 MED FILL — BICALUTAMIDE 50 MG TABLET: 50 | 30 days supply | Qty: 30 | Fill #2

## 2016-06-13 ENCOUNTER — Other Ambulatory Visit: Payer: Self-pay | Admitting: Hematology & Oncology

## 2016-06-16 ENCOUNTER — Other Ambulatory Visit (HOSPITAL_BASED_OUTPATIENT_CLINIC_OR_DEPARTMENT_OTHER): Payer: BLUE CROSS/BLUE SHIELD

## 2016-06-16 ENCOUNTER — Ambulatory Visit (HOSPITAL_BASED_OUTPATIENT_CLINIC_OR_DEPARTMENT_OTHER): Payer: BLUE CROSS/BLUE SHIELD | Admitting: Hematology & Oncology

## 2016-06-16 ENCOUNTER — Ambulatory Visit: Payer: BLUE CROSS/BLUE SHIELD

## 2016-06-16 ENCOUNTER — Telehealth: Payer: Self-pay | Admitting: *Deleted

## 2016-06-16 VITALS — BP 137/84 | HR 73 | Temp 98.4°F | Resp 18 | Wt 151.8 lb

## 2016-06-16 DIAGNOSIS — M545 Low back pain: Secondary | ICD-10-CM

## 2016-06-16 DIAGNOSIS — C61 Malignant neoplasm of prostate: Secondary | ICD-10-CM | POA: Diagnosis not present

## 2016-06-16 DIAGNOSIS — C7951 Secondary malignant neoplasm of bone: Secondary | ICD-10-CM

## 2016-06-16 DIAGNOSIS — G8929 Other chronic pain: Secondary | ICD-10-CM

## 2016-06-16 LAB — CMP (CANCER CENTER ONLY)
ALBUMIN: 3.9 g/dL (ref 3.3–5.5)
ALK PHOS: 217 U/L — AB (ref 26–84)
ALT(SGPT): 22 U/L (ref 10–47)
AST: 29 U/L (ref 11–38)
BILIRUBIN TOTAL: 0.8 mg/dL (ref 0.20–1.60)
BUN, Bld: 5 mg/dL — ABNORMAL LOW (ref 7–22)
CALCIUM: 6.4 mg/dL — AB (ref 8.0–10.3)
CO2: 23 mEq/L (ref 18–33)
CREATININE: 0.8 mg/dL (ref 0.6–1.2)
Chloride: 109 mEq/L — ABNORMAL HIGH (ref 98–108)
GLUCOSE: 106 mg/dL (ref 73–118)
Potassium: 3.9 mEq/L (ref 3.3–4.7)
SODIUM: 139 meq/L (ref 128–145)
Total Protein: 7.4 g/dL (ref 6.4–8.1)

## 2016-06-16 LAB — CBC WITH DIFFERENTIAL (CANCER CENTER ONLY)
BASO#: 0 10*3/uL (ref 0.0–0.2)
BASO%: 0.4 % (ref 0.0–2.0)
EOS ABS: 0.4 10*3/uL (ref 0.0–0.5)
EOS%: 5.5 % (ref 0.0–7.0)
HEMATOCRIT: 37.3 % — AB (ref 38.7–49.9)
HEMOGLOBIN: 12.6 g/dL — AB (ref 13.0–17.1)
LYMPH#: 2.2 10*3/uL (ref 0.9–3.3)
LYMPH%: 30.4 % (ref 14.0–48.0)
MCH: 31.9 pg (ref 28.0–33.4)
MCHC: 33.8 g/dL (ref 32.0–35.9)
MCV: 94 fL (ref 82–98)
MONO#: 0.7 10*3/uL (ref 0.1–0.9)
MONO%: 9.2 % (ref 0.0–13.0)
NEUT%: 54.5 % (ref 40.0–80.0)
NEUTROS ABS: 3.9 10*3/uL (ref 1.5–6.5)
Platelets: 138 10*3/uL — ABNORMAL LOW (ref 145–400)
RBC: 3.95 10*6/uL — ABNORMAL LOW (ref 4.20–5.70)
RDW: 16.5 % — AB (ref 11.1–15.7)
WBC: 7.1 10*3/uL (ref 4.0–10.0)

## 2016-06-16 MED ORDER — OXYCODONE HCL 5 MG PO TABS: 5.0000 mg | ORAL_TABLET | Freq: Four times a day (QID) | ORAL | 0 refills | Status: DC | PRN

## 2016-06-16 MED FILL — oxyCODONE HCL 5 MG TABS: 5 | 15 days supply | Qty: 60 | Fill #0

## 2016-06-16 NOTE — Progress Notes (Signed)
Hematology and Oncology Follow Up Visit  Ryan Davis [id number] 12/14/1960 56 y.o. 06/16/2016   Principle Diagnosis:  Metastatic prostate cancer - androgen sensitive  Current Therapy:   Lupron 11.5 mg IM every 3 month - first dose given 05/20/2016 Casodex 50 mg by mouth daily Zytiga 1000 mg by mouth daily/prednisone 10 mg by mouth daily - to start on 04/20/2016 Xgeva 120 mg subcutaneous every month    Interim History:  Ryan Davis is here today with his brother for follow-up. It is amazing how well his PSA has responded. His PSA a month ago was down to 8.2. Back in early March his PSA was 6000.  He is doing well. He is tolerating the Zytiga and Casodex without any difficulties.  There's been no nausea or vomiting.  He still has some back discomfort.  His alkaline phosphatase, on his lab work has improved quite nicely.  He's had no fever. He's had no bleeding. He's had no issues with bowels or bladder.  I am just very impressed as to how well he has responded to therapy.  Overall, I'll see his performance status is ECOG 1.    Medications:  Allergies as of 06/16/2016   No Known Allergies     Medication List       Accurate as of 06/16/16 10:26 AM. Always use your most recent med list.          abiraterone Acetate 250 MG tablet Commonly known as:  ZYTIGA Take 4 tablets (1,000 mg total) by mouth daily. Take on an empty stomach 1 hour before or 2 hours after a meal   bicalutamide 50 MG tablet Commonly known as:  CASODEX Take 1 tablet (50 mg total) by mouth daily.   cyclobenzaprine 10 MG tablet Commonly known as:  FLEXERIL Take 1 tablet (10 mg total) by mouth 2 (two) times daily as needed for muscle spasms.   diltiazem 240 MG 24 hr capsule Commonly known as:  CARDIZEM CD TAKE 1 CAPSULE BY MOUTH EVERY DAY   HYDROcodone-acetaminophen 5-325 MG tablet Commonly known as:  NORCO/VICODIN   ibuprofen 800 MG tablet Commonly known as:  ADVIL,MOTRIN Take 1 tablet (800  mg total) by mouth 3 (three) times daily.   metoprolol tartrate 25 MG tablet Commonly known as:  LOPRESSOR Take 0.5 tablets (12.5 mg total) by mouth 2 (two) times daily.   oxycodone-ibuprofen 5-400 MG tablet Commonly known as:  COMBUNOX Take 1 tablet by mouth every 8 (eight) hours as needed for pain.       Allergies: No Known Allergies  Past Medical History, Surgical history, Social history, and Family History were reviewed and updated.  Review of Systems: All other 10 point review of systems is negative.   Physical Exam:  weight is 151 lb 12.8 oz (68.9 kg). His oral temperature is 98.4 F (36.9 C). His blood pressure is 137/84 and his pulse is 73. His respiration is 18 and oxygen saturation is 100%.   Wt Readings from Last 3 Encounters:  06/16/16 151 lb 12.8 oz (68.9 kg)  05/19/16 151 lb 12.8 oz (68.9 kg)  04/18/16 149 lb (67.6 kg)    Thin but well-nourished African-American male. Head and neck exam shows no ocular or oral lesions. He has no palpable cervical or supraclavicular lymph nodes. Lungs are clear bilaterally. Cardiac exam regular rate and rhythm with no murmurs, rubs or bruits. Abdomen is soft. He has good bowel sounds. There is no fluid wave. There is no palpable liver or spleen  tip. Back exam shows some slight tenderness in the mid thoracic spine to percussion. No paravertebral muscle spasms are noted. Extremities shows no clubbing, cyanosis or edema. He has good range of motion of his joints. He has good strength in his extremities. Neurological exam shows no focal neurological deficits. Skin exam shows no rashes, ecchymoses or petechia.  Lab Results  Component Value Date   WBC 7.1 06/16/2016   HGB 12.6 (L) 06/16/2016   HCT 37.3 (L) 06/16/2016   MCV 94 06/16/2016   PLT 138 (L) 06/16/2016   No results found for: FERRITIN, IRON, TIBC, UIBC, IRONPCTSAT Lab Results  Component Value Date   RBC 3.95 (L) 06/16/2016   Lab Results  Component Value Date    KAPLAMBRATIO 0.89 03/28/2016   Lab Results  Component Value Date   IGGSERUM 1,066 03/28/2016   IGMSERUM 106 03/28/2016   No results found for: Odetta Pink, SPEI   Chemistry      Component Value Date/Time   NA 141 05/19/2016 1309   K 4.0 05/19/2016 1309   CL 103 05/19/2016 1309   CO2 27 05/19/2016 1309   BUN 4 (L) 05/19/2016 1309   CREATININE 1.1 05/19/2016 1309      Component Value Date/Time   CALCIUM 8.6 05/19/2016 1309   ALKPHOS 714 (H) 05/19/2016 1309   AST 25 05/19/2016 1309   ALT 20 05/19/2016 1309   BILITOT 0.80 05/19/2016 1309      Impression and Plan: Ryan Davis is a 56 yo African-American male with metastatic prostate cancer and wide spread skeletal metastasis.  I think it would be nice to get a bone scan on him. We might want to get the new bone scan for prostate cancer. I think this would be very reasonable and very helpful.   His calcium is too low for [place] to get him Xgeva today. I told him to take some calcium supplements at home. He is certainly at liberty to take it dairy products.   We will get him back in one month.  He is not due for his Lupron until July.  We will see what his testosterone level is. Back in April and was less than 3.  I spent about 25 minutes with he and his brother.  [name], MD 5/21/201810:26 AM

## 2016-06-16 NOTE — Telephone Encounter (Signed)
Critical Value Calcium 6.4 Dr Marin Olp notified. No orders at this time

## 2016-06-17 ENCOUNTER — Telehealth: Payer: Self-pay | Admitting: *Deleted

## 2016-06-17 LAB — PSA: Prostate Specific Ag, Serum: 0.8 ng/mL (ref 0.0–4.0)

## 2016-06-17 LAB — TESTOSTERONE

## 2016-06-17 NOTE — Telephone Encounter (Signed)
-----   Message from [name], MD sent at 06/17/2016 10:06 AM EDT ----- Call - PSA is now below 1!!!  This is a miracle!!!! Ryan Davis

## 2016-06-18 NOTE — Telephone Encounter (Signed)
No is no longer in service

## 2016-06-25 MED FILL — BICALUTAMIDE 50 MG TABLET: 50 | 30 days supply | Qty: 30 | Fill #3

## 2016-07-04 ENCOUNTER — Encounter (HOSPITAL_COMMUNITY): Payer: BLUE CROSS/BLUE SHIELD

## 2016-07-07 ENCOUNTER — Ambulatory Visit: Payer: BLUE CROSS/BLUE SHIELD

## 2016-07-07 ENCOUNTER — Ambulatory Visit: Payer: BLUE CROSS/BLUE SHIELD | Admitting: Hematology & Oncology

## 2016-07-07 ENCOUNTER — Other Ambulatory Visit: Payer: BLUE CROSS/BLUE SHIELD

## 2016-07-13 ENCOUNTER — Other Ambulatory Visit: Payer: Self-pay | Admitting: Family

## 2016-07-14 ENCOUNTER — Other Ambulatory Visit: Payer: Self-pay | Admitting: *Deleted

## 2016-07-14 DIAGNOSIS — C61 Malignant neoplasm of prostate: Secondary | ICD-10-CM

## 2016-07-14 MED ORDER — OXYCODONE HCL 5 MG PO TABS: 5.0000 mg | ORAL_TABLET | Freq: Four times a day (QID) | ORAL | 0 refills | Status: DC | PRN

## 2016-07-15 MED FILL — oxyCODONE HCL 5 MG TABS: 5 | 15 days supply | Qty: 60 | Fill #0

## 2016-07-23 MED FILL — BICALUTAMIDE 50 MG TABLET: 50 | 30 days supply | Qty: 30 | Fill #4

## 2016-07-28 ENCOUNTER — Ambulatory Visit: Payer: BLUE CROSS/BLUE SHIELD

## 2016-07-28 ENCOUNTER — Ambulatory Visit (HOSPITAL_BASED_OUTPATIENT_CLINIC_OR_DEPARTMENT_OTHER): Payer: BLUE CROSS/BLUE SHIELD | Admitting: Hematology & Oncology

## 2016-07-28 ENCOUNTER — Other Ambulatory Visit: Payer: Self-pay | Admitting: *Deleted

## 2016-07-28 ENCOUNTER — Other Ambulatory Visit (HOSPITAL_BASED_OUTPATIENT_CLINIC_OR_DEPARTMENT_OTHER): Payer: BLUE CROSS/BLUE SHIELD

## 2016-07-28 VITALS — BP 126/81 | HR 65 | Temp 98.2°F | Resp 17 | Wt 154.0 lb

## 2016-07-28 DIAGNOSIS — C61 Malignant neoplasm of prostate: Secondary | ICD-10-CM | POA: Diagnosis not present

## 2016-07-28 DIAGNOSIS — C7951 Secondary malignant neoplasm of bone: Secondary | ICD-10-CM

## 2016-07-28 DIAGNOSIS — I4891 Unspecified atrial fibrillation: Secondary | ICD-10-CM

## 2016-07-28 LAB — CBC WITH DIFFERENTIAL (CANCER CENTER ONLY)
BASO#: 0 10*3/uL (ref 0.0–0.2)
BASO%: 0.3 % (ref 0.0–2.0)
EOS%: 4.1 % (ref 0.0–7.0)
Eosinophils Absolute: 0.3 10*3/uL (ref 0.0–0.5)
HCT: 37.9 % — ABNORMAL LOW (ref 38.7–49.9)
HGB: 12.7 g/dL — ABNORMAL LOW (ref 13.0–17.1)
LYMPH#: 1.8 10*3/uL (ref 0.9–3.3)
LYMPH%: 24.3 % (ref 14.0–48.0)
MCH: 32.2 pg (ref 28.0–33.4)
MCHC: 33.5 g/dL (ref 32.0–35.9)
MCV: 96 fL (ref 82–98)
MONO#: 0.9 10*3/uL (ref 0.1–0.9)
MONO%: 11.9 % (ref 0.0–13.0)
NEUT#: 4.3 10*3/uL (ref 1.5–6.5)
NEUT%: 59.4 % (ref 40.0–80.0)
PLATELETS: 147 10*3/uL (ref 145–400)
RBC: 3.94 10*6/uL — ABNORMAL LOW (ref 4.20–5.70)
RDW: 13.6 % (ref 11.1–15.7)
WBC: 7.2 10*3/uL (ref 4.0–10.0)

## 2016-07-28 LAB — CMP (CANCER CENTER ONLY)
ALT: 28 U/L (ref 10–47)
AST: 22 U/L (ref 11–38)
Albumin: 3.6 g/dL (ref 3.3–5.5)
Alkaline Phosphatase: 100 U/L — ABNORMAL HIGH (ref 26–84)
BUN, Bld: 4 mg/dL — ABNORMAL LOW (ref 7–22)
CO2: 24 mEq/L (ref 18–33)
CREATININE: 0.6 mg/dL (ref 0.6–1.2)
Calcium: 6.7 mg/dL — ABNORMAL LOW (ref 8.0–10.3)
Chloride: 103 mEq/L (ref 98–108)
Glucose, Bld: 121 mg/dL — ABNORMAL HIGH (ref 73–118)
Potassium: 4 mEq/L (ref 3.3–4.7)
Sodium: 139 mEq/L (ref 128–145)
Total Bilirubin: 0.7 mg/dl (ref 0.20–1.60)
Total Protein: 7.3 g/dL (ref 6.4–8.1)

## 2016-07-28 MED ORDER — METOPROLOL TARTRATE 25 MG PO TABS: 12.5000 mg | ORAL_TABLET | Freq: Two times a day (BID) | ORAL | 6 refills | Status: DC

## 2016-07-28 NOTE — Progress Notes (Signed)
Hematology and Oncology Follow Up Visit  Ryan Davis [id number] Feb 26, 1960 56 y.o. 07/28/2016   Principle Diagnosis:  Metastatic prostate cancer - androgen sensitive  Current Therapy:   Lupron 11.5 mg IM every 3 month - next dose to be given 08/2016 Casodex 50 mg by mouth daily Zytiga 1000 mg by mouth daily/prednisone 10 mg by mouth daily -   started on 04/20/2016 Xgeva 120 mg subcutaneous every month    Interim History:  [name] is here today for follow-up. He has responded incredibly well to the Lupron/Zytiga/Casodex combination. His PSA has come down to 0.8. This was the level back in May. I'm just incredibly impressed as to how well it is come down.  He was suppose have a bone scan at his last visit. A Mos[facility] Y this was not done. We will make sure another one is ordered.  He is complaining of a lot of back pain. I am not sure what could be causing this now. Given that his PSA and alkaline phosphatase have  responded so well, I would think that there should not be any issues with cancer. It might be that he just has back issues that may need to be addressed by a spine specialist. We may need to consider an MRI.  His appetite is doing better. He's had no nausea or vomiting. There is no change in bowel or bladder habits. He's had no leg swelling. He's had no rashes.  I will have to refill his metoprolol. I'm sure that he does not have a family doctor.  He says that he is taking calcium pills. His calcium has been on the low side.  Overall, I'll see his performance status is ECOG 1.    Medications:  Allergies as of 07/28/2016   No Known Allergies     Medication List       Accurate as of 07/28/16 12:33 PM. Always use your most recent med list.          abiraterone Acetate 250 MG tablet Commonly known as:  ZYTIGA Take 4 tablets (1,000 mg total) by mouth daily. Take on an empty stomach 1 hour before or 2 hours after a meal   bicalutamide 50 MG tablet Commonly known as:   CASODEX Take 1 tablet (50 mg total) by mouth daily.   CARTIA XT 240 MG 24 hr capsule Generic drug:  diltiazem TAKE 1 CAPSULE BY MOUTH EVERY DAY   cyclobenzaprine 10 MG tablet Commonly known as:  FLEXERIL Take 1 tablet (10 mg total) by mouth 2 (two) times daily as needed for muscle spasms.   ibuprofen 800 MG tablet Commonly known as:  ADVIL,MOTRIN Take 1 tablet (800 mg total) by mouth 3 (three) times daily.   metoprolol tartrate 25 MG tablet Commonly known as:  LOPRESSOR Take 0.5 tablets (12.5 mg total) by mouth 2 (two) times daily.   oxyCODONE 5 MG immediate release tablet Commonly known as:  Oxy IR/ROXICODONE Take 1 tablet (5 mg total) by mouth every 6 (six) hours as needed for severe pain.       Allergies: No Known Allergies  Past Medical History, Surgical history, Social history, and Family History were reviewed and updated.  Review of Systems: All other 10 point review of systems is negative.   Physical Exam:  weight is 154 lb (69.9 kg). His oral temperature is 98.2 F (36.8 C). His blood pressure is 126/81 and his pulse is 65. His respiration is 17 and oxygen saturation is 100%.  Wt Readings from Last 3 Encounters:  07/28/16 154 lb (69.9 kg)  06/16/16 151 lb 12.8 oz (68.9 kg)  05/19/16 151 lb 12.8 oz (68.9 kg)    Thin but well-nourished African-American male. Head and neck exam shows no ocular or oral lesions. He has no palpable cervical or supraclavicular lymph nodes. Lungs are clear bilaterally. Cardiac exam regular rate and rhythm with no murmurs, rubs or bruits. Abdomen is soft. He has good bowel sounds. There is no fluid wave. There is no palpable liver or spleen tip. Back exam shows some slight tenderness in the mid thoracic spine to percussion. No paravertebral muscle spasms are noted. Extremities shows no clubbing, cyanosis or edema. He has good range of motion of his joints. He has good strength in his extremities. Neurological exam shows no focal  neurological deficits. Skin exam shows no rashes, ecchymoses or petechia.  Lab Results  Component Value Date   WBC 7.2 07/28/2016   HGB 12.7 (L) 07/28/2016   HCT 37.9 (L) 07/28/2016   MCV 96 07/28/2016   PLT 147 07/28/2016   No results found for: FERRITIN, IRON, TIBC, UIBC, IRONPCTSAT Lab Results  Component Value Date   RBC 3.94 (L) 07/28/2016   Lab Results  Component Value Date   KAPLAMBRATIO 0.89 03/28/2016   Lab Results  Component Value Date   IGGSERUM 1,066 03/28/2016   IGMSERUM 106 03/28/2016   No results found for: Ronnald Ramp, A1GS, A2GS, Violet Baldy, MSPIKE, SPEI   Chemistry      Component Value Date/Time   NA 139 07/28/2016 1150   K 4.0 07/28/2016 1150   CL 103 07/28/2016 1150   CO2 24 07/28/2016 1150   BUN 4 (L) 07/28/2016 1150   CREATININE 0.6 07/28/2016 1150      Component Value Date/Time   CALCIUM 6.7 (L) 07/28/2016 1150   ALKPHOS 100 (H) 07/28/2016 1150   AST 22 07/28/2016 1150   ALT 28 07/28/2016 1150   BILITOT 0.70 07/28/2016 1150      Impression and Plan: [name] is a 56 yo African-American male with metastatic prostate cancer and wide spread skeletal metastasis.  I'm not sure why do not can the bone scan last time. I will make sure he gets the bone scan this time.  Given that his alkaline phosphatase is now almost normal, and his PSA is normal, I would have to think that his bone scan should be much better.  The back pain might be from his cancer. It might be microfractures from metastatic disease. Depending on the bone scan, we might consider an MRI.  I'm just very impressed as to how low his PSA has come.   We still cannot give him the Xgeva. We may have to think about Zometa in the future if his calcium cannot come up.  I'll plan to see him back in another 6 weeks.  [name], MD 7/2/201812:33 PM

## 2016-07-29 LAB — TESTOSTERONE: Testosterone, Serum: <3 ng/dL — ABNORMAL LOW (ref 264–916)

## 2016-07-29 LAB — PSA: Prostate Specific Ag, Serum: 0.5 ng/mL (ref 0.0–4.0)

## 2016-08-07 ENCOUNTER — Other Ambulatory Visit: Payer: Self-pay | Admitting: *Deleted

## 2016-08-07 DIAGNOSIS — C61 Malignant neoplasm of prostate: Secondary | ICD-10-CM

## 2016-08-07 MED ORDER — OXYCODONE HCL 5 MG PO TABS: 5.0000 mg | ORAL_TABLET | Freq: Four times a day (QID) | ORAL | 0 refills | Status: DC | PRN

## 2016-08-08 MED FILL — oxyCODONE HCL 5 MG TABS: 5 | 15 days supply | Qty: 60 | Fill #0

## 2016-08-15 ENCOUNTER — Other Ambulatory Visit: Payer: Self-pay | Admitting: Family

## 2016-08-18 ENCOUNTER — Encounter (HOSPITAL_COMMUNITY)
Admission: RE | Admit: 2016-08-18 | Discharge: 2016-08-18 | Disposition: A | Discharge status: Home / Self Care | Payer: BLUE CROSS/BLUE SHIELD | Source: Ambulatory Visit | Attending: Hematology & Oncology | Admitting: Hematology & Oncology

## 2016-08-18 ENCOUNTER — Other Ambulatory Visit: Payer: Self-pay | Admitting: *Deleted

## 2016-08-18 DIAGNOSIS — C7951 Secondary malignant neoplasm of bone: Secondary | ICD-10-CM

## 2016-08-18 DIAGNOSIS — I4891 Unspecified atrial fibrillation: Secondary | ICD-10-CM

## 2016-08-18 DIAGNOSIS — C61 Malignant neoplasm of prostate: Secondary | ICD-10-CM

## 2016-08-18 MED ORDER — DILTIAZEM HCL ER COATED BEADS 240 MG PO CP24: ORAL_CAPSULE | ORAL | 2 refills | Status: DC

## 2016-08-18 MED ORDER — TECHNETIUM TC 99M MEDRONATE IV KIT
25.0000 | PACK | Freq: Once | INTRAVENOUS | Status: AC | PRN
  Administered 2016-08-18: 25 via INTRAVENOUS

## 2016-08-25 ENCOUNTER — Ambulatory Visit (HOSPITAL_BASED_OUTPATIENT_CLINIC_OR_DEPARTMENT_OTHER): Payer: BLUE CROSS/BLUE SHIELD | Admitting: Hematology & Oncology

## 2016-08-25 ENCOUNTER — Other Ambulatory Visit (HOSPITAL_BASED_OUTPATIENT_CLINIC_OR_DEPARTMENT_OTHER): Payer: BLUE CROSS/BLUE SHIELD

## 2016-08-25 DIAGNOSIS — M549 Dorsalgia, unspecified: Secondary | ICD-10-CM

## 2016-08-25 DIAGNOSIS — C61 Malignant neoplasm of prostate: Secondary | ICD-10-CM

## 2016-08-25 DIAGNOSIS — D649 Anemia, unspecified: Secondary | ICD-10-CM | POA: Diagnosis not present

## 2016-08-25 DIAGNOSIS — C7951 Secondary malignant neoplasm of bone: Secondary | ICD-10-CM | POA: Diagnosis not present

## 2016-08-25 DIAGNOSIS — I4891 Unspecified atrial fibrillation: Secondary | ICD-10-CM

## 2016-08-25 LAB — CBC WITH DIFFERENTIAL (CANCER CENTER ONLY)
BASO#: 0 10*3/uL (ref 0.0–0.2)
BASO%: 0.3 % (ref 0.0–2.0)
EOS ABS: 0.5 10*3/uL (ref 0.0–0.5)
EOS%: 4.2 % (ref 0.0–7.0)
HEMATOCRIT: 38.1 % — AB (ref 38.7–49.9)
HEMOGLOBIN: 13.1 g/dL (ref 13.0–17.1)
LYMPH#: 2.5 10*3/uL (ref 0.9–3.3)
LYMPH%: 23.3 % (ref 14.0–48.0)
MCH: 31.6 pg (ref 28.0–33.4)
MCHC: 34.4 g/dL (ref 32.0–35.9)
MCV: 92 fL (ref 82–98)
MONO#: 1.1 10*3/uL — ABNORMAL HIGH (ref 0.1–0.9)
MONO%: 9.9 % (ref 0.0–13.0)
NEUT%: 62.3 % (ref 40.0–80.0)
NEUTROS ABS: 6.6 10*3/uL — AB (ref 1.5–6.5)
Platelets: 162 10*3/uL (ref 145–400)
RBC: 4.15 10*6/uL — ABNORMAL LOW (ref 4.20–5.70)
RDW: 12.7 % (ref 11.1–15.7)
WBC: 10.7 10*3/uL — ABNORMAL HIGH (ref 4.0–10.0)

## 2016-08-25 LAB — CMP (CANCER CENTER ONLY)
ALBUMIN: 3.7 g/dL (ref 3.3–5.5)
ALT(SGPT): 21 U/L (ref 10–47)
AST: 19 U/L (ref 11–38)
Alkaline Phosphatase: 113 U/L — ABNORMAL HIGH (ref 26–84)
BUN: 3 mg/dL — AB (ref 7–22)
CALCIUM: 7.7 mg/dL — AB (ref 8.0–10.3)
CHLORIDE: 107 meq/L (ref 98–108)
CO2: 25 meq/L (ref 18–33)
Creat: 0.5 mg/dl — ABNORMAL LOW (ref 0.6–1.2)
Glucose, Bld: 126 mg/dL — ABNORMAL HIGH (ref 73–118)
Potassium: 3.3 mEq/L (ref 3.3–4.7)
Sodium: 138 mEq/L (ref 128–145)
Total Bilirubin: 0.7 mg/dl (ref 0.20–1.60)
Total Protein: 7 g/dL (ref 6.4–8.1)

## 2016-08-25 MED ORDER — OXYCODONE-ACETAMINOPHEN 10-325 MG PO TABS: 1.0000 | ORAL_TABLET | Freq: Four times a day (QID) | ORAL | 0 refills | Status: DC | PRN

## 2016-08-25 MED FILL — BICALUTAMIDE 50 MG TABLET: 50 | 30 days supply | Qty: 30 | Fill #5

## 2016-08-25 MED FILL — OXYCODONE-ACETAMINOPHEN 10-: 10-325 | 23 days supply | Qty: 90 | Fill #0

## 2016-08-25 NOTE — Progress Notes (Signed)
Hematology and Oncology Follow Up Visit  Ryan Davis [id number] [birth year] 56 y.o. 08/25/2016   Principle Diagnosis:  Metastatic prostate cancer - androgen sensitive  Current Therapy:   Lupron 11.5 mg IM every 3 month - next dose to be given 08/2016 Casodex 50 mg by mouth daily Zytiga 1000 mg by mouth daily/prednisone 10 mg by mouth daily -   started on 04/20/2016 Zometa 4 mg IV q month - start on 08/2016   Interim History:  Ryan Davis is here today for follow-up. Unfortunately, he got really bad news yesterday. His younger brother died of a heart attack. This obviously was quite sudden.  His wife is in the hospital. She apparently is quite anemic.  He has done incredibly well with the antiandrogen therapy. His last PSA was 0.5.  We did get a bone scan on him. The bone scan does show uptake in most of his bones. This appears be similar to his last bone scan which is somewhat surprising.  He does have a lot of lower back pain. I'm going to need to have an MRI done so we can see with that looks like. He may have fractures in his lower back. If so, we may have to consider kyphoplasty.   His appetite is doing better. He's had no nausea or vomiting. There is no change in bowel or bladder habits. He's had no leg swelling. He's had no rashes.  Overall,  his performance status is ECOG 1.    Medications:  Allergies as of 08/25/2016   No Known Allergies     Medication List       Accurate as of 08/25/16  9:40 AM. Always use your most recent med list.          abiraterone Acetate 250 MG tablet Commonly known as:  ZYTIGA Take 4 tablets (1,000 mg total) by mouth daily. Take on an empty stomach 1 hour before or 2 hours after a meal   bicalutamide 50 MG tablet Commonly known as:  CASODEX Take 1 tablet (50 mg total) by mouth daily.   cyclobenzaprine 10 MG tablet Commonly known as:  FLEXERIL Take 1 tablet (10 mg total) by mouth 2 (two) times daily as needed for muscle spasms.     diltiazem 240 MG 24 hr capsule Commonly known as:  CARTIA XT TAKE 1 CAPSULE BY MOUTH EVERY DAY   ibuprofen 800 MG tablet Commonly known as:  ADVIL,MOTRIN Take 1 tablet (800 mg total) by mouth 3 (three) times daily.   metoprolol tartrate 25 MG tablet Commonly known as:  LOPRESSOR Take 0.5 tablets (12.5 mg total) by mouth 2 (two) times daily.   oxyCODONE 5 MG immediate release tablet Commonly known as:  Oxy IR/ROXICODONE Take 1 tablet (5 mg total) by mouth every 6 (six) hours as needed for severe pain.       Allergies: No Known Allergies  Past Medical History, Surgical history, Social history, and Family History were reviewed and updated.  Review of Systems: All other 10 point review of systems is negative.   Physical Exam:  vitals were not taken for this visit.  Wt Readings from Last 3 Encounters:  07/28/16 154 lb (69.9 kg)  06/16/16 151 lb 12.8 oz (68.9 kg)  05/19/16 151 lb 12.8 oz (68.9 kg)    Thin but well-nourished African-American male. Head and neck exam shows no ocular or oral lesions. He has no palpable cervical or supraclavicular lymph nodes. Lungs are clear bilaterally. Cardiac exam regular rate and rhythm  with no murmurs, rubs or bruits. Abdomen is soft. He has good bowel sounds. There is no fluid wave. There is no palpable liver or spleen tip. Back exam shows some slight tenderness in the mid thoracic spine to percussion. No paravertebral muscle spasms are noted. Extremities shows no clubbing, cyanosis or edema. He has good range of motion of his joints. He has good strength in his extremities. Neurological exam shows no focal neurological deficits. Skin exam shows no rashes, ecchymoses or petechia.  Lab Results  Component Value Date   WBC 10.7 (H) 08/25/2016   HGB 13.1 08/25/2016   HCT 38.1 (L) 08/25/2016   MCV 92 08/25/2016   PLT 162 08/25/2016   No results found for: FERRITIN, IRON, TIBC, UIBC, IRONPCTSAT Lab Results  Component Value Date   RBC 4.15 (L)  08/25/2016   Lab Results  Component Value Date   KAPLAMBRATIO 0.89 03/28/2016   Lab Results  Component Value Date   IGGSERUM 1,066 03/28/2016   IGMSERUM 106 03/28/2016   No results found for: Ronnald Ramp, A1GS, A2GS, Violet Baldy, MSPIKE, SPEI   Chemistry      Component Value Date/Time   NA 139 07/28/2016 1150   K 4.0 07/28/2016 1150   CL 103 07/28/2016 1150   CO2 24 07/28/2016 1150   BUN 4 (L) 07/28/2016 1150   CREATININE 0.6 07/28/2016 1150      Component Value Date/Time   CALCIUM 6.7 (L) 07/28/2016 1150   ALKPHOS 100 (H) 07/28/2016 1150   AST 22 07/28/2016 1150   ALT 28 07/28/2016 1150   BILITOT 0.70 07/28/2016 1150      Impression and Plan: Ryan Davis is a 56 yo African-American male with metastatic prostate cancer and widespread skeletal metastasis.  I think it will be interesting to see what the MRI shows.  Because of his persistent hypocalcemia, we cannot give him Xgeva. I will have to give him Zometa. I really think a bone hardener is really going to help.  I also increased his pain medicine a little bit.  I'll plan to see him back in another 4 weeks.  [name], MD 7/30/20189:40 AM

## 2016-08-26 LAB — TESTOSTERONE

## 2016-08-26 LAB — PSA: PROSTATE SPECIFIC AG, SERUM: 0.7 ng/mL (ref 0.0–4.0)

## 2016-08-30 ENCOUNTER — Ambulatory Visit (HOSPITAL_BASED_OUTPATIENT_CLINIC_OR_DEPARTMENT_OTHER)
Admission: RE | Admit: 2016-08-30 | Discharge: 2016-08-30 | Disposition: A | Discharge status: Home / Self Care | Payer: BLUE CROSS/BLUE SHIELD | Source: Ambulatory Visit | Attending: Hematology & Oncology | Admitting: Hematology & Oncology

## 2016-08-30 DIAGNOSIS — M5126 Other intervertebral disc displacement, lumbar region: Secondary | ICD-10-CM | POA: Diagnosis not present

## 2016-08-30 DIAGNOSIS — C7951 Secondary malignant neoplasm of bone: Secondary | ICD-10-CM | POA: Diagnosis present

## 2016-08-30 DIAGNOSIS — M549 Dorsalgia, unspecified: Secondary | ICD-10-CM

## 2016-08-30 DIAGNOSIS — M5136 Other intervertebral disc degeneration, lumbar region: Secondary | ICD-10-CM | POA: Diagnosis not present

## 2016-08-30 DIAGNOSIS — C61 Malignant neoplasm of prostate: Secondary | ICD-10-CM | POA: Diagnosis present

## 2016-08-30 MED ORDER — GADOBENATE DIMEGLUMINE 529 MG/ML IV SOLN
15.0000 mL | Freq: Once | INTRAVENOUS | Status: AC | PRN
  Administered 2016-08-30: 14 mL via INTRAVENOUS

## 2016-09-01 ENCOUNTER — Telehealth: Payer: Self-pay | Admitting: *Deleted

## 2016-09-01 NOTE — Telephone Encounter (Addendum)
Patient is aware of results  ----- Message from [name], MD sent at 09/01/2016  6:36 AM EDT ----- Call - there may be a little arthritis.  No fractures.  No nerve compression.  Do have some bone mets.  I will see if radiation therapy can help!!!  Ryan Davis

## 2016-09-02 ENCOUNTER — Encounter: Payer: Self-pay | Admitting: Radiation Oncology

## 2016-09-05 NOTE — Progress Notes (Signed)
Histology and Location of Primary Cancer: Metastatic prostate cancer - androgen sensitive  Location(s) of Symptomatic Metastases: spine  Past/Anticipated chemotherapy by medical oncology, if any: Lupron 11.5 mg IM every 3 month - next dose to be given 08/2016, Casodex 50 mg by mouth daily, Zytiga 1000 mg by mouth daily -   started on 04/20/2016, Zometa 4 mg IV q month - start on 08/2016  Pain on a scale of 0-10 is: 7 - in his sacrum - worse in the morning - takes oxycodone 5 mg 4 times a day.  He reports having 8 tablets left.   If Spine Met(s), symptoms, if any, include:  Bowel/Bladder retention or incontinence (please describe): no  Numbness or weakness in extremities (please describe): sometimes in his legs.  He said it happens in the mornings and at bedtime.  Current Decadron regimen, if applicable: none  Ambulatory status? Walker? Wheelchair?: ambulatory  SAFETY ISSUES:  Prior radiation? no  Pacemaker/ICD? no  Possible current pregnancy? no  Is the patient on methotrexate? no  Current Complaints / other details:  Prefers morning treatments.   BP 119/75 (BP Location: Left Arm, Patient Position: Sitting)   Pulse 81   Temp 98.2 F (36.8 C) (Oral)   Ht 6' 2" (1.88 m)   Wt 154 lb 3.2 oz (69.9 kg)   SpO2 100%   BMI 19.80 kg/m    Wt Readings from Last 3 Encounters:  09/10/16 154 lb 3.2 oz (69.9 kg)  07/28/16 154 lb (69.9 kg)  06/16/16 151 lb 12.8 oz (68.9 kg)

## 2016-09-10 ENCOUNTER — Ambulatory Visit
Admission: RE | Admit: 2016-09-10 | Discharge: 2016-09-10 | Disposition: A | Discharge status: Home / Self Care | Payer: BLUE CROSS/BLUE SHIELD | Source: Ambulatory Visit | Attending: Radiation Oncology | Admitting: Radiation Oncology

## 2016-09-10 ENCOUNTER — Encounter: Payer: Self-pay | Admitting: Radiation Oncology

## 2016-09-10 VITALS — BP 119/75 | HR 81 | Temp 98.2°F | Ht 74.0 in | Wt 154.2 lb

## 2016-09-10 DIAGNOSIS — M549 Dorsalgia, unspecified: Secondary | ICD-10-CM | POA: Diagnosis not present

## 2016-09-10 DIAGNOSIS — C7951 Secondary malignant neoplasm of bone: Secondary | ICD-10-CM | POA: Insufficient documentation

## 2016-09-10 DIAGNOSIS — C61 Malignant neoplasm of prostate: Secondary | ICD-10-CM

## 2016-09-10 DIAGNOSIS — F17210 Nicotine dependence, cigarettes, uncomplicated: Secondary | ICD-10-CM | POA: Insufficient documentation

## 2016-09-10 DIAGNOSIS — M5136 Other intervertebral disc degeneration, lumbar region: Secondary | ICD-10-CM | POA: Diagnosis not present

## 2016-09-10 NOTE — Progress Notes (Signed)
Radiation Oncology         [phone] ________________________________  Initial outpatient Consultation  Name: Ryan Davis MRN: [id number]  Date: 09/10/2016  DOB: 1960-04-15  RJ:JOACZYS, No Pcp Per  [name], MD   REFERRING PHYSICIAN: [name], MD  DIAGNOSIS: The encounter diagnosis was Prostate cancer metastatic to bone [facility]).    ICD-10-CM   1. Prostate cancer metastatic to bone [facility]) C61    C79.51     HISTORY OF PRESENT ILLNESS: Ryan Davis is a 56 y.o. male with a diagnosis of metastatic androgen sensitive prostate cancer in March 2018. This was discovered after he presented to the ED complaining of severe back pain in February 2018. He currently follows with Dr. Marin Olp and receives Lupron 11.5 mg IM every three months. He is also taking Casodex 50 mg by mouth daily, Zytiga 1000 mg by mouth daily/prednisone 10 mg by mouth daily started on 04/20/2016, and Zometa 4 mg IV q month starting on 08/2016.  Trending PSA as follows: 03/28/2016 - 5839.0 04/18/2016 - 1227.0 05/19/2016 - 8.2 06/16/2016 - 0.8 07/28/2016 - 0.5 08/25/2016 - 0.7  Most recent bone scan on 08/18/2016 shows diffuse radiotracer uptake involving majority of the spine, ribs, sternum, manubrium, sacrum and acetabuli region consistent with diffuse osseous metastatic disease similar to prior exam. One area which may have increased is radiotracer uptake left aspect L2.  The patient complained of severe lower back pain during his last visit with Dr. Marin Olp on 08/25/2016. Accordingly an MRI of the lumbar spine was performed on 08/30/2016 showing diffusely abnormal marrow signal correlates with diffuse osseous metastatic disease seen on prior examinations. Negative for central canal or foraminal narrowing. No finding to explain the patient's lower extremity symptoms. Minimal disc bulge L4-5 and mild to moderate facet degenerative disease L5-S1 noted.   The patient is here for further evaluation and discussion  of radiation treatment in the management of his disease.    PREVIOUS RADIATION THERAPY: No  PAST MEDICAL HISTORY:  Past Medical History:  Diagnosis Date  . Back pain without radiculopathy 03/28/2016  . Prostate cancer metastatic to bone ([place]) 03/31/2016  . Prostate cancer metastatic to multiple sites [facility]) 03/31/2016      PAST SURGICAL HISTORY: Past Surgical History:  Procedure Laterality Date  . NO PAST SURGERIES      FAMILY HISTORY:  Family History  Problem Relation Age of Onset  . Stroke Sister   . Cancer Neg Hx   . Heart disease Neg Hx   . Diabetes Neg Hx     SOCIAL HISTORY:  Social History   Social History  . Marital status: Married    Spouse name: N/A  . Number of children: N/A  . Years of education: N/A   Occupational History  . Not on file.   Social History Main Topics  . Smoking status: Current Every Day Smoker    Packs/day: 0.25    Years: 39.00    Types: Cigarettes  . Smokeless tobacco: Never Used     Comment: 04/18/2016 still smokes  . Alcohol use Yes     Comment: has not drank in 7 months.  He used to drink 40oz  of beer daily  . Drug use: No  . Sexual activity: Not on file   Other Topics Concern  . Not on file   Social History Narrative  . No narrative on file    ALLERGIES: Patient has no known allergies.  MEDICATIONS:  Current Outpatient Prescriptions  Medication Sig Dispense Refill  . abiraterone Acetate (ZYTIGA) 250 MG tablet Take 4 tablets (1,000 mg total) by mouth daily. Take on an empty stomach 1 hour before or 2 hours after a meal 120 tablet 6  . bicalutamide (CASODEX) 50 MG tablet Take 1 tablet (50 mg total) by mouth daily. 30 tablet 6  . cyclobenzaprine (FLEXERIL) 10 MG tablet Take 1 tablet (10 mg total) by mouth 2 (two) times daily as needed for muscle spasms. 10 tablet 0  . diltiazem (CARTIA XT) 240 MG 24 hr capsule TAKE 1 CAPSULE BY MOUTH EVERY DAY 30 capsule 2  . metoprolol tartrate (LOPRESSOR) 25 MG tablet Take 0.5 tablets (12.5  mg total) by mouth 2 (two) times daily. 60 tablet 6  . oxyCODONE (OXY IR/ROXICODONE) 5 MG immediate release tablet   0  . ibuprofen (ADVIL,MOTRIN) 800 MG tablet Take 1 tablet (800 mg total) by mouth 3 (three) times daily. (Patient not taking: Reported on 09/10/2016) 21 tablet 0  . oxyCODONE-acetaminophen (PERCOCET) 10-325 MG tablet Take 1 tablet by mouth every 6 (six) hours as needed for pain. (Patient not taking: Reported on 09/10/2016) 90 tablet 0   No current facility-administered medications for this encounter.     REVIEW OF SYSTEMS:  On review of systems, the patient reports that he is doing well overall. He reports pain in his low back/sacrum is 7 on pain scale and worse in the mornings. This pain causes difficulty sleeping. He is taking oxycodone 5 mg four times a day. He reports having 8 tablets left. He also uses a heating pad to alleviate pain. States when he wakes up in the morning he has pain in his chest and back. He goes on to talk about often helping his wife get up off the floor at night and how he sometimes strains his back while doing this. He previously worked as a Development worker, international aid but is unable to do much of this work with his back pain. States he is able to mow a few yards occasionally. He has applied for disability. He reports weakness in his legs sometimes, describing it as a "shaky feeling". No bowel or bladder incontinence.    REVIEW OF SYSTEMS: A 10+ POINT REVIEW OF SYSTEMS WAS OBTAINED including neurology, dermatology, psychiatry, cardiac, respiratory, lymph, extremities, GI, GU, musculoskeletal, constitutional, reproductive, HEENT. All pertinent positives are noted in the HPI. All others are negative.    PHYSICAL EXAM:  Wt Readings from Last 3 Encounters:  09/10/16 154 lb 3.2 oz (69.9 kg)  07/28/16 154 lb (69.9 kg)  06/16/16 151 lb 12.8 oz (68.9 kg)   Temp Readings from Last 3 Encounters:  09/10/16 98.2 F (36.8 C) (Oral)  07/28/16 98.2 F (36.8 C) (Oral)  06/16/16 98.4 F  (36.9 C) (Oral)   BP Readings from Last 3 Encounters:  09/10/16 119/75  07/28/16 126/81  06/16/16 137/84   Pulse Readings from Last 3 Encounters:  09/10/16 81  07/28/16 65  06/16/16 73   Pain Assessment Pain Score: 7  (below buttocks)/10  General: Alert and oriented, in no acute distress HEENT: Head is normocephalic. Extraocular movements are intact. Oropharynx is clear. Poor dentition. Neck: Neck is supple, no palpable cervical or supraclavicular lymphadenopathy. Heart: Regular in rate and rhythm with no murmurs, rubs, or gallops. Chest: Clear to auscultation bilaterally, with no rhonchi, wheezes, or rales. Abdomen: Soft, nontender, nondistended, with no rigidity or guarding. Extremities: No cyanosis or edema. Lymphatics: see Neck Exam Skin: No concerning lesions. Musculoskeletal: symmetric strength and muscle  tone throughout. Localizes pain to the lumbosacral area, more-so to the upper lumbar spine. Tender with palpation to the lumbosacral spine. Bilateral lower extremity strength is intact. Neurologic: Cranial nerves II through XII are grossly intact. No obvious focalities. Speech is fluent. Coordination is intact.  Psychiatric: Judgment and insight are intact. Affect is appropriate.   KPS = 90  LABORATORY DATA:  Lab Results  Component Value Date   WBC 10.7 (H) 08/25/2016   HGB 13.1 08/25/2016   HCT 38.1 (L) 08/25/2016   MCV 92 08/25/2016   PLT 162 08/25/2016   Lab Results  Component Value Date   NA 138 08/25/2016   K 3.3 08/25/2016   CL 107 08/25/2016   CO2 25 08/25/2016   Lab Results  Component Value Date   ALT 21 08/25/2016   AST 19 08/25/2016   ALKPHOS 113 (H) 08/25/2016   BILITOT 0.70 08/25/2016     Results for BRAYCEN, BURANDT (MRN [id number]) as of 09/10/2016 13:58  Ref. Range 03/28/2016 14:34 04/18/2016 11:53 05/19/2016 13:09 06/16/2016 09:38 07/28/2016 11:50 08/25/2016 09:06  Prostate Specific Ag, Serum Latest Ref Range: 0.0 - 4.0 ng/mL 5,839.0 (H) 1,227.0  (H) 8.2 (H) 0.8 0.5 0.7    RADIOGRAPHY: Mr Lumbar Spine W Wo Contrast  Result Date: 08/30/2016 CLINICAL DATA:  Chronic low back pain with bilateral lower extremity weakness. Patient diagnosed with metastatic prostate carcinoma in March, 2018. EXAM: MRI LUMBAR SPINE WITHOUT AND WITH CONTRAST TECHNIQUE: Multiplanar and multiecho pulse sequences of the lumbar spine were obtained without and with intravenous contrast. CONTRAST:  14 ml MULTIHANCE GADOBENATE DIMEGLUMINE 529 MG/ML IV SOLN COMPARISON:  Whole-body bone scan 08/18/2016. CT chest, abdomen and pelvis 04/09/2016 and CT lumbar spine 03/26/2016. FINDINGS: Segmentation:  Standard. Alignment:  Maintained. Vertebrae: There is abnormal decreased T1 and T2 signal in all imaged bones consistent with diffuse metastatic prostate carcinoma as seen on the prior examinations. No fracture. Conus medullaris: Extends to the L1-2 level and appears normal. Paraspinal and other soft tissues: Negative. Disc levels: T11-12 and T12-L1 are imaged in the sagittal plane only and negative. L1-2:  Negative. L2-3:  Negative. L3-4:  Negative. L4-5: Minimal disc bulge without central canal or foraminal stenosis. L5-S1: Mild-to-moderate facet degenerative change. Otherwise negative. IMPRESSION: Diffusely abnormal marrow signal correlates with diffuse osseous metastatic disease seen on prior examinations. Negative for central canal or foraminal narrowing. No finding to explain the patient's lower extremity symptoms. Minimal disc bulge L4-5 and mild to moderate facet degenerative disease L5-S1 noted. Electronically Signed   By: Inge Rise M.D.   On: 08/30/2016 12:50   Nm Bone Scan Whole Body  Result Date: 08/18/2016 CLINICAL DATA:  56 year old male with prostate cancer on therapy with normal PSA. Complaints of severe back pain without trauma. Subsequent encounter. EXAM: NUCLEAR MEDICINE WHOLE BODY BONE SCAN TECHNIQUE: Whole body anterior and posterior images were obtained  approximately 3 hours after intravenous injection of radiopharmaceutical. RADIOPHARMACEUTICALS:  21.0 MCi Technetium-58m MDP IV COMPARISON:  04/09/2016 bone scan and CT. FINDINGS: Diffuse radiotracer uptake involving majority of the spine, ribs, sternum, manubrium, sacrum and acetabuli region consistent with diffuse osseous metastatic disease similar to prior exam. One area which may have increased is radiotracer uptake left aspect L2. Areas of radiotracer uptake right midfoot, wrist and shoulders may be related to degenerative changes. Increased uptake left maxillary region may be related to inflammation although metastatic disease not excluded. Kidneys poorly delineated consistent with super scan findings. IMPRESSION: Diffuse radiotracer uptake involving majority of the spine, ribs,  sternum, manubrium, sacrum and acetabuli region consistent with diffuse osseous metastatic disease similar to prior exam. One area which may have increased is radiotracer uptake left aspect L2. If evaluation for possibly of epidural spread of tumor is clinically desired, MR imaging may then be considered. Electronically Signed   By: Genia Del M.D.   On: 08/18/2016 15:25      IMPRESSION: 56 year-old gentleman with metastatic prostate cancer and widespread skeletal metastasis. It is surprising that his pain has persisted despite excellent response to his prostate therapies based on his most recent PSA. But his most recent bone scan did show diffuse uptake throughout the spine and sacrum consistent with diffuse osseous metastatic disease. Bone scan showed a new area at L2, left aspect despite his excellent PSA response to androgen ablation.   Today, I talked to the patient about the findings and work-up thus far.  We discussed the natural history of metastatic prostate cancer to bone and general treatment, highlighting the role of palliative radiotherapy in the management.  We discussed the available radiation techniques, and  focused on the details of logistics and delivery.  We reviewed the anticipated acute and late sequelae associated with radiation in this setting.  The patient was encouraged to ask questions that I answered to the best of my ability. The patient would like to proceed with palliative radiation and has been scheduled for CT simulation tomorrow at 11 am.  PLAN: Referral to social work to discuss applying for permanent disability. He will proceed with CT simulation tomorrow and will begin radiation treatment in the near future. Anticipate between 2 and 3 weeks of radiation therapy.  -----------------------------------  Blair Promise, PhD, MD  This document serves as a record of services personally performed by Gery Pray, MD. It was created on his behalf by Arlyce Harman, a trained medical scribe. The creation of this record is based on the scribe's personal observations and the provider's statements to them. This document has been checked and approved by the attending provider.

## 2016-09-10 NOTE — Progress Notes (Signed)
Please see the Nurse Progress Note in the MD Initial Consult Encounter for this patient. 

## 2016-09-11 ENCOUNTER — Ambulatory Visit
Admission: RE | Admit: 2016-09-11 | Discharge: 2016-09-11 | Disposition: A | Discharge status: Home / Self Care | Payer: BLUE CROSS/BLUE SHIELD | Source: Ambulatory Visit | Attending: Radiation Oncology | Admitting: Radiation Oncology

## 2016-09-11 DIAGNOSIS — C61 Malignant neoplasm of prostate: Secondary | ICD-10-CM | POA: Diagnosis not present

## 2016-09-11 DIAGNOSIS — C7951 Secondary malignant neoplasm of bone: Principal | ICD-10-CM

## 2016-09-11 NOTE — Progress Notes (Signed)
  Radiation Oncology         [phone] ________________________________  Name: Ryan Davis MRN: [id number]  Date: 09/11/2016  DOB: 04/07/1960  SIMULATION AND TREATMENT PLANNING NOTE    ICD-10-CM   1. Prostate cancer metastatic to bone [facility]) C61    C79.51     DIAGNOSIS:  56 year-old gentleman with metastatic prostate cancer and widespread skeletal metastasis.  NARRATIVE:  The patient was brought to the [place].  Identity was confirmed.  All relevant records and images related to the planned course of therapy were reviewed.  The patient freely provided informed written consent to proceed with treatment after reviewing the details related to the planned course of therapy. The consent form was witnessed and verified by the simulation staff.  Then, the patient was set-up in a stable reproducible  supine position for radiation therapy.  CT images were obtained.  Surface markings were placed.  The CT images were loaded into the planning software.  Then the target and avoidance structures were contoured.  Treatment planning then occurred.  The radiation prescription was entered and confirmed.  Then, I designed and supervised the construction of a total of 3 medically necessary complex treatment devices.  I have requested : 3D Simulation  I have requested a DVH of the following structures: GTV, spinal cord, kidneys.  I have ordered:dose calc.  PLAN:  The patient will receive 35 Gy in 14 fractions.  -----------------------------------  Blair Promise, PhD, MD  This document serves as a record of services personally performed by Gery Pray, MD. It was created on his behalf by Arlyce Harman, a trained medical scribe. The creation of this record is based on the scribe's personal observations and the provider's statements to them. This document has been checked and approved by the attending provider.

## 2016-09-15 DIAGNOSIS — C61 Malignant neoplasm of prostate: Secondary | ICD-10-CM | POA: Diagnosis not present

## 2016-09-16 ENCOUNTER — Ambulatory Visit
Admission: RE | Admit: 2016-09-16 | Discharge: 2016-09-16 | Disposition: A | Discharge status: Home / Self Care | Payer: BLUE CROSS/BLUE SHIELD | Source: Ambulatory Visit | Attending: Radiation Oncology | Admitting: Radiation Oncology

## 2016-09-16 ENCOUNTER — Other Ambulatory Visit: Payer: Self-pay | Admitting: Radiation Oncology

## 2016-09-16 DIAGNOSIS — C7951 Secondary malignant neoplasm of bone: Principal | ICD-10-CM

## 2016-09-16 DIAGNOSIS — C61 Malignant neoplasm of prostate: Secondary | ICD-10-CM

## 2016-09-16 DIAGNOSIS — M549 Dorsalgia, unspecified: Secondary | ICD-10-CM

## 2016-09-16 MED ORDER — SONAFINE EX EMUL
1.0000 "application " | Freq: Once | CUTANEOUS | Status: AC
  Administered 2016-09-16: 1 via TOPICAL

## 2016-09-16 MED ORDER — OXYCODONE-ACETAMINOPHEN 10-325 MG PO TABS: 1.0000 | ORAL_TABLET | Freq: Four times a day (QID) | ORAL | 0 refills | Status: DC | PRN

## 2016-09-16 NOTE — Progress Notes (Signed)
Pt here for patient teaching.  Pt given Radiation and You booklet and Sonafine.  Reviewed areas of pertinence such as diarrhea, fatigue, nausea and vomiting and skin changes . Pt able to give teach back of to pat skin, use unscented/gentle soap and have Imodium on hand,apply Sonafine bid and avoid applying anything to skin within 4 hours of treatment. Pt demonstrated understanding and verbalizes understanding of information given and will contact nursing with any questions or concerns.

## 2016-09-16 NOTE — Progress Notes (Signed)
  Radiation Oncology         [phone] ________________________________  Name: Ryan Davis MRN: [id number]  Date: 09/16/2016  DOB: 1960/06/27  Simulation Verification Note    ICD-10-CM   1. Prostate cancer metastatic to multiple sites [facility]) C61   2. Prostate cancer metastatic to bone [facility]) C61    C79.51     Status: outpatient  NARRATIVE: The patient was brought to the treatment unit and placed in the planned treatment position. The clinical setup was verified. Then port films were obtained and uploaded to the radiation oncology medical record software.  The treatment beams were carefully compared against the planned radiation fields. The position location and shape of the radiation fields was reviewed. They targeted volume of tissue appears to be appropriately covered by the radiation beams. Organs at risk appear to be excluded as planned.  Based on my personal review, I approved the simulation verification. The patient's treatment will proceed as planned.  -----------------------------------  Blair Promise, PhD, MD

## 2016-09-17 ENCOUNTER — Ambulatory Visit
Admission: RE | Admit: 2016-09-17 | Discharge: 2016-09-17 | Disposition: A | Discharge status: Home / Self Care | Payer: BLUE CROSS/BLUE SHIELD | Source: Ambulatory Visit | Attending: Radiation Oncology | Admitting: Radiation Oncology

## 2016-09-17 DIAGNOSIS — C61 Malignant neoplasm of prostate: Secondary | ICD-10-CM | POA: Diagnosis not present

## 2016-09-18 ENCOUNTER — Ambulatory Visit
Admission: RE | Admit: 2016-09-18 | Discharge: 2016-09-18 | Disposition: A | Discharge status: Home / Self Care | Payer: BLUE CROSS/BLUE SHIELD | Source: Ambulatory Visit | Attending: Radiation Oncology | Admitting: Radiation Oncology

## 2016-09-18 DIAGNOSIS — C61 Malignant neoplasm of prostate: Secondary | ICD-10-CM | POA: Diagnosis not present

## 2016-09-19 ENCOUNTER — Ambulatory Visit
Admission: RE | Admit: 2016-09-19 | Discharge: 2016-09-19 | Disposition: A | Discharge status: Home / Self Care | Payer: BLUE CROSS/BLUE SHIELD | Source: Ambulatory Visit | Attending: Radiation Oncology | Admitting: Radiation Oncology

## 2016-09-19 DIAGNOSIS — C61 Malignant neoplasm of prostate: Secondary | ICD-10-CM | POA: Diagnosis not present

## 2016-09-22 ENCOUNTER — Encounter: Payer: Self-pay | Admitting: *Deleted

## 2016-09-22 ENCOUNTER — Ambulatory Visit
Admission: RE | Admit: 2016-09-22 | Discharge: 2016-09-22 | Disposition: A | Discharge status: Home / Self Care | Payer: BLUE CROSS/BLUE SHIELD | Source: Ambulatory Visit | Attending: Radiation Oncology | Admitting: Radiation Oncology

## 2016-09-22 DIAGNOSIS — C61 Malignant neoplasm of prostate: Secondary | ICD-10-CM | POA: Diagnosis not present

## 2016-09-22 MED FILL — BICALUTAMIDE 50 MG TABLET: 50 | 30 days supply | Qty: 30 | Fill #6

## 2016-09-22 NOTE — Progress Notes (Signed)
[place] Psychosocial Distress Screening Clinical Social Work  Clinical Social Work was referred by distress screening protocol.  The patient scored a 5 on the Psychosocial Distress Thermometer which indicates moderate distress. Clinical Social Worker contacted patient at home to assess for distress and other psychosocial needs.  Patient expressed increased financial concerns after reducing work hours.  Patient stated he has applied for Social Security Disability, but his application was still pending.  CSW and patient discussed financial resources.  CSW encouraged patient to apply for the [place].  Patient plans to bring his income information to treatment tomorrow and will meet with the financial advocate.  CSW shared patient information with financial advocate.  CSW encouraged patient to call with questions or concerns.          ONCBCN DISTRESS SCREENING 09/10/2016  Screening Type Initial Screening  Distress experienced in past week (1-10) 5  Practical problem type Housing;Insurance;Food  Emotional problem type Feeling hopeless  Information Concerns Type Lack of info about diagnosis;Lack of info about treatment;Lack of info about complementary therapy choices;Lack of info about maintaining fitness  Physical Problem type Pain;Sexual problems  Physician notified of physical symptoms Yes    Johnnye Lana, MSW, LCSW, OSW-C Clinical Social Worker [place] [phone]

## 2016-09-23 ENCOUNTER — Ambulatory Visit (HOSPITAL_BASED_OUTPATIENT_CLINIC_OR_DEPARTMENT_OTHER): Payer: BLUE CROSS/BLUE SHIELD

## 2016-09-23 ENCOUNTER — Other Ambulatory Visit (HOSPITAL_BASED_OUTPATIENT_CLINIC_OR_DEPARTMENT_OTHER): Payer: BLUE CROSS/BLUE SHIELD

## 2016-09-23 ENCOUNTER — Ambulatory Visit (HOSPITAL_BASED_OUTPATIENT_CLINIC_OR_DEPARTMENT_OTHER): Payer: BLUE CROSS/BLUE SHIELD | Admitting: Hematology & Oncology

## 2016-09-23 ENCOUNTER — Ambulatory Visit
Admission: RE | Admit: 2016-09-23 | Discharge: 2016-09-23 | Disposition: A | Discharge status: Home / Self Care | Payer: BLUE CROSS/BLUE SHIELD | Source: Ambulatory Visit | Attending: Radiation Oncology | Admitting: Radiation Oncology

## 2016-09-23 ENCOUNTER — Other Ambulatory Visit: Payer: Self-pay | Admitting: Family

## 2016-09-23 VITALS — BP 106/73 | HR 91 | Temp 98.1°F | Resp 18 | Wt 156.0 lb

## 2016-09-23 DIAGNOSIS — C61 Malignant neoplasm of prostate: Secondary | ICD-10-CM

## 2016-09-23 DIAGNOSIS — G8929 Other chronic pain: Secondary | ICD-10-CM

## 2016-09-23 DIAGNOSIS — C7951 Secondary malignant neoplasm of bone: Secondary | ICD-10-CM

## 2016-09-23 DIAGNOSIS — Z5111 Encounter for antineoplastic chemotherapy: Secondary | ICD-10-CM

## 2016-09-23 DIAGNOSIS — M5442 Lumbago with sciatica, left side: Principal | ICD-10-CM

## 2016-09-23 DIAGNOSIS — M549 Dorsalgia, unspecified: Secondary | ICD-10-CM

## 2016-09-23 LAB — CMP (CANCER CENTER ONLY)
ALK PHOS: 166 U/L — AB (ref 26–84)
ALT: 13 U/L (ref 10–47)
AST: 20 U/L (ref 11–38)
Albumin: 3.6 g/dL (ref 3.3–5.5)
BUN, Bld: 6 mg/dL — ABNORMAL LOW (ref 7–22)
CALCIUM: 9.5 mg/dL (ref 8.0–10.3)
CHLORIDE: 104 meq/L (ref 98–108)
CO2: 30 meq/L (ref 18–33)
Creat: 0.9 mg/dl (ref 0.6–1.2)
GLUCOSE: 140 mg/dL — AB (ref 73–118)
POTASSIUM: 3.6 meq/L (ref 3.3–4.7)
Sodium: 142 mEq/L (ref 128–145)
Total Bilirubin: 0.7 mg/dl (ref 0.20–1.60)
Total Protein: 7.4 g/dL (ref 6.4–8.1)

## 2016-09-23 LAB — CBC WITH DIFFERENTIAL (CANCER CENTER ONLY)
BASO#: 0 10*3/uL (ref 0.0–0.2)
BASO%: 0.2 % (ref 0.0–2.0)
EOS ABS: 0.2 10*3/uL (ref 0.0–0.5)
EOS%: 3.7 % (ref 0.0–7.0)
HEMATOCRIT: 37.8 % — AB (ref 38.7–49.9)
HGB: 13 g/dL (ref 13.0–17.1)
LYMPH#: 0.9 10*3/uL (ref 0.9–3.3)
LYMPH%: 16.9 % (ref 14.0–48.0)
MCH: 30.6 pg (ref 28.0–33.4)
MCHC: 34.4 g/dL (ref 32.0–35.9)
MCV: 89 fL (ref 82–98)
MONO#: 0.6 10*3/uL (ref 0.1–0.9)
MONO%: 11.1 % (ref 0.0–13.0)
NEUT#: 3.7 10*3/uL (ref 1.5–6.5)
NEUT%: 68.1 % (ref 40.0–80.0)
Platelets: 173 10*3/uL (ref 145–400)
RBC: 4.25 10*6/uL (ref 4.20–5.70)
RDW: 13 % (ref 11.1–15.7)
WBC: 5.4 10*3/uL (ref 4.0–10.0)

## 2016-09-23 MED ORDER — DENOSUMAB 120 MG/1.7ML ~~LOC~~ SOLN
SUBCUTANEOUS | Status: AC
Start: 2016-09-23 — End: ?
  Filled 2016-09-23: qty 1.7

## 2016-09-23 MED ORDER — ZOLEDRONIC ACID 4 MG/100ML IV SOLN
4.0000 mg | Freq: Once | INTRAVENOUS | Status: AC
  Administered 2016-09-23: 4 mg via INTRAVENOUS
  Filled 2016-09-23: qty 100

## 2016-09-23 MED ORDER — LEUPROLIDE ACETATE (3 MONTH) 11.25 MG IM KIT
11.2500 mg | PACK | Freq: Once | INTRAMUSCULAR | Status: AC
Start: 2016-09-23 — End: 2016-09-23
  Administered 2016-09-23: 11.25 mg via INTRAMUSCULAR
  Filled 2016-09-23: qty 11.25

## 2016-09-23 MED ORDER — DENOSUMAB 120 MG/1.7ML ~~LOC~~ SOLN: 120.0000 mg | Freq: Once | SUBCUTANEOUS | Status: DC

## 2016-09-23 MED ORDER — OXYCODONE-ACETAMINOPHEN 5-325 MG PO TABS
2.0000 | ORAL_TABLET | Freq: Once | ORAL | Status: AC
  Administered 2016-09-23: 2 via ORAL

## 2016-09-23 MED ORDER — OXYCODONE-ACETAMINOPHEN 5-325 MG PO TABS
ORAL_TABLET | ORAL | Status: AC
  Filled 2016-09-23: qty 2

## 2016-09-23 NOTE — Progress Notes (Signed)
Hematology and Oncology Follow Up Visit  Ryan Davis [id number] 1960-11-21 56 y.o. 09/23/2016   Principle Diagnosis:  Metastatic prostate cancer - androgen sensitive  Current Therapy:   Lupron 11.5 mg IM every 3 month - next dose to be given 11/2016 Casodex 50 mg by mouth daily Zytiga 1000 mg by mouth daily/prednisone 10 mg by mouth daily -   started on 04/20/2016 Zometa 4 mg IV q month - start on 08/2016   Interim History:  Ryan Davis is here today for follow-up. He started his radiation therapy to his back. Of these had 4 treatments so far. He still has some discomfort back there.  He did have a MRI done. This was done on August 4. He had abnormal marrow signal correlating with the prostate cancer. There were no fractures. He had some arthritic changes.  His prostate cancer has been doing quite well. His last PSA back a month ago was 0.7.  He's not having any problems with the Casodex over the Zytiga. He's not having any nausea or vomiting. There is no diarrhea.  His wife sees be doing okay. She is in the hospital for anemia. She has diabetes. She has MS.  He's had no cough. He's had no leg swelling. He's had no rashes.  Overall, his performance status is ECOG 1.  Medications:  Allergies as of 09/23/2016   No Known Allergies     Medication List       Accurate as of 09/23/16  9:53 AM. Always use your most recent med list.          abiraterone Acetate 250 MG tablet Commonly known as:  ZYTIGA Take 4 tablets (1,000 mg total) by mouth daily. Take on an empty stomach 1 hour before or 2 hours after a meal   bicalutamide 50 MG tablet Commonly known as:  CASODEX Take 1 tablet (50 mg total) by mouth daily.   cyclobenzaprine 10 MG tablet Commonly known as:  FLEXERIL Take 1 tablet (10 mg total) by mouth 2 (two) times daily as needed for muscle spasms.   diltiazem 240 MG 24 hr capsule Commonly known as:  CARTIA XT TAKE 1 CAPSULE BY MOUTH EVERY DAY   ibuprofen 800 MG  tablet Commonly known as:  ADVIL,MOTRIN Take 1 tablet (800 mg total) by mouth 3 (three) times daily.   metoprolol tartrate 25 MG tablet Commonly known as:  LOPRESSOR Take 0.5 tablets (12.5 mg total) by mouth 2 (two) times daily.   oxyCODONE 5 MG immediate release tablet Commonly known as:  Oxy IR/ROXICODONE   oxyCODONE-acetaminophen 10-325 MG tablet Commonly known as:  PERCOCET Take 1 tablet by mouth every 6 (six) hours as needed for pain.   SONAFINE EX Apply topically.       Allergies: No Known Allergies  Past Medical History, Surgical history, Social history, and Family History were reviewed and updated.  Review of Systems: As stated in the interim history  Physical Exam:  weight is 156 lb (70.8 kg). His oral temperature is 98.1 F (36.7 C). His blood pressure is 106/73 and his pulse is 91. His respiration is 18 and oxygen saturation is 100%.   Wt Readings from Last 3 Encounters:  09/23/16 156 lb (70.8 kg)  09/10/16 154 lb 3.2 oz (69.9 kg)  07/28/16 154 lb (69.9 kg)    Physical Exam  Constitutional: He is oriented to person, place, and time.  HENT:  Head: Normocephalic and atraumatic.  Mouth/Throat: Oropharynx is clear and moist.  Eyes: Pupils  are equal, round, and reactive to light. EOM are normal.  Neck: Normal range of motion.  Cardiovascular: Normal rate, regular rhythm and normal heart sounds.   Pulmonary/Chest: Effort normal and breath sounds normal.  Abdominal: Soft. Bowel sounds are normal.  Musculoskeletal: Normal range of motion. He exhibits no edema, tenderness or deformity.  Lymphadenopathy:    He has no cervical adenopathy.  Neurological: He is alert and oriented to person, place, and time.  Skin: Skin is warm and dry. No rash noted. No erythema.  Psychiatric: He has a normal mood and affect. His behavior is normal. Judgment and thought content normal.  Vitals reviewed.    Lab Results  Component Value Date   WBC 5.4 09/23/2016   HGB 13.0  09/23/2016   HCT 37.8 (L) 09/23/2016   MCV 89 09/23/2016   PLT 173 09/23/2016   No results found for: FERRITIN, IRON, TIBC, UIBC, IRONPCTSAT Lab Results  Component Value Date   RBC 4.25 09/23/2016   Lab Results  Component Value Date   KAPLAMBRATIO 0.89 03/28/2016   Lab Results  Component Value Date   IGGSERUM 1,066 03/28/2016   IGMSERUM 106 03/28/2016   No results found for: Odetta Pink, SPEI   Chemistry      Component Value Date/Time   NA 142 09/23/2016 0923   K 3.6 09/23/2016 0923   CL 104 09/23/2016 0923   CO2 30 09/23/2016 0923   BUN 6 (L) 09/23/2016 0923   CREATININE 0.9 09/23/2016 0923      Component Value Date/Time   CALCIUM 9.5 09/23/2016 0923   ALKPHOS 166 (H) 09/23/2016 0923   AST 20 09/23/2016 0923   ALT 13 09/23/2016 0923   BILITOT 0.70 09/23/2016 0923      Impression and Plan: Ryan Davis is a 56 yo African-American male with metastatic prostate cancer and widespread skeletal metastasis.  We will have to see how he does with radiation.  We will see what his PSA does.  Hopefully, the radiation will give him some relief.  He will get his Lupron today. His next dose will be in November.  We will give him Zometa today. We cannot give him Delton See because he becomes quite hypocalcemic.  I will like to see him back in 4 weeks.  [name], MD 8/28/20189:53 AM

## 2016-09-23 NOTE — Patient Instructions (Signed)

## 2016-09-24 ENCOUNTER — Ambulatory Visit
Admission: RE | Admit: 2016-09-24 | Discharge: 2016-09-24 | Disposition: A | Discharge status: Home / Self Care | Payer: BLUE CROSS/BLUE SHIELD | Source: Ambulatory Visit | Attending: Radiation Oncology | Admitting: Radiation Oncology

## 2016-09-24 DIAGNOSIS — C61 Malignant neoplasm of prostate: Secondary | ICD-10-CM | POA: Diagnosis not present

## 2016-09-24 LAB — PSA: Prostate Specific Ag, Serum: 1.5 ng/mL (ref 0.0–4.0)

## 2016-09-25 ENCOUNTER — Ambulatory Visit
Admission: RE | Admit: 2016-09-25 | Discharge: 2016-09-25 | Disposition: A | Discharge status: Home / Self Care | Payer: BLUE CROSS/BLUE SHIELD | Source: Ambulatory Visit | Attending: Radiation Oncology | Admitting: Radiation Oncology

## 2016-09-25 DIAGNOSIS — C61 Malignant neoplasm of prostate: Secondary | ICD-10-CM | POA: Diagnosis not present

## 2016-09-26 ENCOUNTER — Other Ambulatory Visit: Payer: Self-pay | Admitting: *Deleted

## 2016-09-26 ENCOUNTER — Ambulatory Visit
Admission: RE | Admit: 2016-09-26 | Discharge: 2016-09-26 | Disposition: A | Discharge status: Home / Self Care | Payer: BLUE CROSS/BLUE SHIELD | Source: Ambulatory Visit | Attending: Radiation Oncology | Admitting: Radiation Oncology

## 2016-09-26 DIAGNOSIS — C61 Malignant neoplasm of prostate: Secondary | ICD-10-CM | POA: Diagnosis not present

## 2016-09-30 ENCOUNTER — Encounter: Payer: Self-pay | Admitting: Hematology & Oncology

## 2016-09-30 ENCOUNTER — Ambulatory Visit
Admission: RE | Admit: 2016-09-30 | Discharge: 2016-09-30 | Disposition: A | Discharge status: Home / Self Care | Payer: BLUE CROSS/BLUE SHIELD | Source: Ambulatory Visit | Attending: Radiation Oncology | Admitting: Radiation Oncology

## 2016-09-30 DIAGNOSIS — C61 Malignant neoplasm of prostate: Secondary | ICD-10-CM | POA: Diagnosis not present

## 2016-09-30 DIAGNOSIS — M549 Dorsalgia, unspecified: Secondary | ICD-10-CM

## 2016-10-01 ENCOUNTER — Ambulatory Visit
Admission: RE | Admit: 2016-10-01 | Discharge: 2016-10-01 | Disposition: A | Discharge status: Home / Self Care | Payer: BLUE CROSS/BLUE SHIELD | Source: Ambulatory Visit | Attending: Radiation Oncology | Admitting: Radiation Oncology

## 2016-10-01 DIAGNOSIS — M549 Dorsalgia, unspecified: Secondary | ICD-10-CM

## 2016-10-01 DIAGNOSIS — C61 Malignant neoplasm of prostate: Secondary | ICD-10-CM | POA: Diagnosis not present

## 2016-10-01 MED ORDER — SONAFINE EX EMUL: 1.0000 "application " | Freq: Two times a day (BID) | CUTANEOUS | Status: AC

## 2016-10-01 MED ORDER — SONAFINE EX EMUL
1.0000 "application " | Freq: Two times a day (BID) | CUTANEOUS | Status: DC
  Administered 2016-10-01: 1 via TOPICAL

## 2016-10-02 ENCOUNTER — Ambulatory Visit
Admission: RE | Admit: 2016-10-02 | Discharge: 2016-10-02 | Disposition: A | Discharge status: Home / Self Care | Payer: BLUE CROSS/BLUE SHIELD | Source: Ambulatory Visit | Attending: Radiation Oncology | Admitting: Radiation Oncology

## 2016-10-02 ENCOUNTER — Ambulatory Visit: Payer: BLUE CROSS/BLUE SHIELD | Admitting: Nutrition

## 2016-10-02 DIAGNOSIS — C61 Malignant neoplasm of prostate: Secondary | ICD-10-CM | POA: Diagnosis not present

## 2016-10-02 NOTE — Progress Notes (Signed)
Patient is a 56 year old male diagnosed with metastatic prostate cancer.  He is a patient of Dr. Marin Olp and Dr. Sondra Come.  Past medical history includes back pain for which he gets radiation therapy.  Medications were reviewed.  Labs include glucose 140.  Height: 6 feet 2 inches. Weight: 156 pounds August 28. Usual body weight: 180 pounds February 2018 BMI: 20.03.  Patient reports he has a very poor appetite. He describes early satiety. He has difficulty chewing food secondary to poor dentition. He denies nausea, vomiting, constipation, diarrhea. Reports pain sometimes affects his ability to eat.  Nutrition diagnosis:  Inadequate oral intake related to metastatic prostate cancer as evidenced by 22# wt loss over 7 months.  Severe malnutrition related to metastatic prostate cancer as evidenced by greater than 10% weight loss over 6 months and less than 75% energy intake for greater than one month.  Patient also has severe depletion of body fat and muscle mass.  Intervention: Patient educated to consume higher calorie, higher protein foods 6 times daily. Recommended patient choose oral nutrition supplements 3 times a day to 4 times a day Provided one complementary case of Ensure Plus. Reviewed strategies for increasing appetite. Provided fact sheets.  Questions were answered.  Teach back method used.  Contact information given.  Monitoring, evaluation, goals: Patient will tolerate increased calories and protein to minimize further weight loss.  Next visit: Patient agrees to contact me if he has questions or concerns.  **Disclaimer: This note was dictated with voice recognition software. Similar sounding words can inadvertently be transcribed and this note may contain transcription errors which may not have been corrected upon publication of note.**

## 2016-10-03 ENCOUNTER — Ambulatory Visit
Admission: RE | Admit: 2016-10-03 | Discharge: 2016-10-03 | Disposition: A | Discharge status: Home / Self Care | Payer: BLUE CROSS/BLUE SHIELD | Source: Ambulatory Visit | Attending: Radiation Oncology | Admitting: Radiation Oncology

## 2016-10-03 ENCOUNTER — Encounter: Payer: BLUE CROSS/BLUE SHIELD | Admitting: Nutrition

## 2016-10-03 DIAGNOSIS — C61 Malignant neoplasm of prostate: Secondary | ICD-10-CM | POA: Diagnosis not present

## 2016-10-06 ENCOUNTER — Ambulatory Visit
Admission: RE | Admit: 2016-10-06 | Discharge: 2016-10-06 | Disposition: A | Discharge status: Home / Self Care | Payer: BLUE CROSS/BLUE SHIELD | Source: Ambulatory Visit | Attending: Radiation Oncology | Admitting: Radiation Oncology

## 2016-10-06 DIAGNOSIS — C61 Malignant neoplasm of prostate: Secondary | ICD-10-CM | POA: Diagnosis not present

## 2016-10-09 ENCOUNTER — Encounter: Payer: Self-pay | Admitting: Radiation Oncology

## 2016-10-09 NOTE — Progress Notes (Signed)
  Radiation Oncology         (336) [phone] ________________________________  Name: Ryan Davis MRN: [id number]  Date: 10/09/2016  DOB: 1960-03-18  End of Treatment Note  Diagnosis:   56 y.o. gentleman with prostate cancer metastatic to bone ([place]).     Indication for treatment:  Pallative       Radiation treatment dates:   09/16/16 - 10/06/16  Site/dose:   35 Gy with 14 Fx to the Lumbar spine   Beams/energy:   Photon // 15X  Narrative: The patient tolerated radiation treatment relatively well.  Patient continued to have pain in his lower back, with occasional pain in legs. Patient denied having dysuria or diarrhea. He reported having some occasional fatigue. The skin on the lower back has hyperpigmentation and he reported using Sonafine as directed. Patient reported having good apetite, but being full very fast and not be able to eat very much.    Plan: The patient has completed radiation treatment. The patient will return to radiation oncology clinic for routine followup in one month. I advised them to call or return sooner if they have any questions or concerns related to their recovery or treatment.  -----------------------------------  Blair Promise, PhD, MD  This document serves as a record of services personally performed by Gery Pray MD. It was created on his behalf by Delton Coombes, a trained medical scribe. The creation of this record is based on the scribe's personal observations and the provider's statements to them. This document has been checked and approved by the attending provider.

## 2016-10-13 ENCOUNTER — Other Ambulatory Visit: Payer: Self-pay | Admitting: Radiation Oncology

## 2016-10-13 ENCOUNTER — Telehealth: Payer: Self-pay | Admitting: Oncology

## 2016-10-13 DIAGNOSIS — M549 Dorsalgia, unspecified: Secondary | ICD-10-CM

## 2016-10-13 DIAGNOSIS — C7951 Secondary malignant neoplasm of bone: Secondary | ICD-10-CM

## 2016-10-13 DIAGNOSIS — C61 Malignant neoplasm of prostate: Secondary | ICD-10-CM

## 2016-10-13 MED ORDER — OXYCODONE-ACETAMINOPHEN 10-325 MG PO TABS: 1.0000 | ORAL_TABLET | Freq: Four times a day (QID) | ORAL | 0 refills | Status: DC | PRN

## 2016-10-13 NOTE — Telephone Encounter (Signed)
Patient called and said he has 3 tablets of percocet left.  He is wondering if it is time for a refill.  He is taking 3-4 a day and it was last filled on 09/16/16.

## 2016-10-14 MED FILL — OXYCODONE-APAP 10-325 MG TA: 10-325 | 23 days supply | Qty: 90 | Fill #0

## 2016-10-14 NOTE — Progress Notes (Signed)
Nutrition  Patient came by and was given 2nd complimentary case of ensure plus today.  Antar Milks B. Zenia Resides, [place], [place] Registered Dietitian [phone] (pager)

## 2016-10-22 ENCOUNTER — Other Ambulatory Visit: Payer: Self-pay | Admitting: Hematology & Oncology

## 2016-10-22 ENCOUNTER — Other Ambulatory Visit (HOSPITAL_BASED_OUTPATIENT_CLINIC_OR_DEPARTMENT_OTHER): Payer: BLUE CROSS/BLUE SHIELD

## 2016-10-22 ENCOUNTER — Ambulatory Visit (HOSPITAL_BASED_OUTPATIENT_CLINIC_OR_DEPARTMENT_OTHER): Payer: BLUE CROSS/BLUE SHIELD | Admitting: Hematology & Oncology

## 2016-10-22 ENCOUNTER — Ambulatory Visit (HOSPITAL_BASED_OUTPATIENT_CLINIC_OR_DEPARTMENT_OTHER): Payer: BLUE CROSS/BLUE SHIELD

## 2016-10-22 VITALS — BP 116/70 | HR 88 | Temp 97.8°F | Resp 20 | Wt 152.0 lb

## 2016-10-22 DIAGNOSIS — C7951 Secondary malignant neoplasm of bone: Secondary | ICD-10-CM

## 2016-10-22 DIAGNOSIS — C61 Malignant neoplasm of prostate: Secondary | ICD-10-CM | POA: Diagnosis not present

## 2016-10-22 DIAGNOSIS — D508 Other iron deficiency anemias: Secondary | ICD-10-CM

## 2016-10-22 LAB — CMP (CANCER CENTER ONLY)
ALK PHOS: 119 U/L — AB (ref 26–84)
ALT: 20 U/L (ref 10–47)
AST: 19 U/L (ref 11–38)
Albumin: 3.5 g/dL (ref 3.3–5.5)
BILIRUBIN TOTAL: 0.5 mg/dL (ref 0.20–1.60)
BUN: 5 mg/dL — AB (ref 7–22)
CO2: 30 meq/L (ref 18–33)
Calcium: 8.8 mg/dL (ref 8.0–10.3)
Chloride: 102 mEq/L (ref 98–108)
Creat: 0.9 mg/dl (ref 0.6–1.2)
GLUCOSE: 117 mg/dL (ref 73–118)
Potassium: 3.4 mEq/L (ref 3.3–4.7)
Sodium: 142 mEq/L (ref 128–145)
Total Protein: 6.6 g/dL (ref 6.4–8.1)

## 2016-10-22 LAB — CBC WITH DIFFERENTIAL (CANCER CENTER ONLY)
BASO#: 0 10*3/uL (ref 0.0–0.2)
BASO%: 0.2 % (ref 0.0–2.0)
EOS ABS: 0.4 10*3/uL (ref 0.0–0.5)
EOS%: 6.2 % (ref 0.0–7.0)
HEMATOCRIT: 34.4 % — AB (ref 38.7–49.9)
HGB: 11.8 g/dL — ABNORMAL LOW (ref 13.0–17.1)
LYMPH#: 1.1 10*3/uL (ref 0.9–3.3)
LYMPH%: 17.1 % (ref 14.0–48.0)
MCH: 30.3 pg (ref 28.0–33.4)
MCHC: 34.3 g/dL (ref 32.0–35.9)
MCV: 88 fL (ref 82–98)
MONO#: 0.8 10*3/uL (ref 0.1–0.9)
MONO%: 12.6 % (ref 0.0–13.0)
NEUT%: 63.9 % (ref 40.0–80.0)
NEUTROS ABS: 4 10*3/uL (ref 1.5–6.5)
Platelets: 176 10*3/uL (ref 145–400)
RBC: 3.89 10*6/uL — ABNORMAL LOW (ref 4.20–5.70)
RDW: 14.4 % (ref 11.1–15.7)
WBC: 6.3 10*3/uL (ref 4.0–10.0)

## 2016-10-22 MED ORDER — MEGESTROL ACETATE 625 MG/5ML PO SUSP: 625.0000 mg | Freq: Every day | ORAL | 3 refills | Status: DC

## 2016-10-22 MED ORDER — ZOLEDRONIC ACID 4 MG/100ML IV SOLN
4.0000 mg | Freq: Once | INTRAVENOUS | Status: AC
  Administered 2016-10-22: 4 mg via INTRAVENOUS
  Filled 2016-10-22: qty 100

## 2016-10-22 MED ORDER — BICALUTAMIDE 50 MG PO TABS: 50.0000 mg | ORAL_TABLET | Freq: Every day | ORAL | 6 refills | Status: DC

## 2016-10-22 MED FILL — MEGESTROL 625 MG/5 ML SUSP: 625 | 30 days supply | Qty: 150 | Fill #0

## 2016-10-22 MED FILL — BICALUTAMIDE 50 MG TABLET: 50 | 30 days supply | Qty: 30 | Fill #0

## 2016-10-22 NOTE — Patient Instructions (Signed)

## 2016-10-22 NOTE — Progress Notes (Signed)
Hematology and Oncology Follow Up Visit  Ryan Davis [id number] 1960-02-19 56 y.o. 10/22/2016   Principle Diagnosis:  Metastatic prostate cancer - androgen sensitive  Current Therapy:   Lupron 11.5 mg IM every 3 month - next dose to be given 11/2016 Casodex 50 mg by mouth daily Zytiga 1000 mg by mouth daily/prednisone 10 mg by mouth daily -   started on 04/20/2016 Zometa 4 mg IV q month - start on 08/2016   Interim History:  Ryan Davis is here today for follow-up. He completed his radiation. He didn't completed this 2 weeks ago. This is to his back. 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  His last PSA was 1.15. This actually is up a little bit. We are going to have to watch this closely.  His weight is down. His appetite is a little bit down. I will try some Megace ES to see if this can help.  He's had no nausea or vomiting. He's had no bleeding. He's had no diarrhea.  There's been no fever.  Unfortunately, his wife has not done well. She did back in the hospital with hypoglycemia.  Currently, his pain seems to be under fairly good control. Hopefully, the radiation will be able to help with his back.  Overall, his performance status is ECOG 1.   Medications:  Allergies as of 10/22/2016   No Known Allergies     Medication List       Accurate as of 10/22/16 11:02 AM. Always use your most recent med list.          abiraterone Acetate 250 MG tablet Commonly known as:  ZYTIGA Take 4 tablets (1,000 mg total) by mouth daily. Take on an empty stomach 1 hour before or 2 hours after a meal   bicalutamide 50 MG tablet Commonly known as:  CASODEX TAKE 1 TABLET BY MOUTH DAILY   cyclobenzaprine 10 MG tablet Commonly known as:  FLEXERIL Take 1 tablet (10 mg total) by mouth 2 (two) times daily as needed for muscle spasms.   diltiazem 240 MG 24 hr capsule Commonly known as:  CARTIA XT TAKE 1 CAPSULE BY MOUTH EVERY DAY   ibuprofen 800 MG tablet Commonly known  as:  ADVIL,MOTRIN Take 1 tablet (800 mg total) by mouth 3 (three) times daily.   metoprolol tartrate 25 MG tablet Commonly known as:  LOPRESSOR Take 0.5 tablets (12.5 mg total) by mouth 2 (two) times daily.   oxyCODONE 5 MG immediate release tablet Commonly known as:  Oxy IR/ROXICODONE   oxyCODONE-acetaminophen 10-325 MG tablet Commonly known as:  PERCOCET Take 1 tablet by mouth every 6 (six) hours as needed for pain.   SONAFINE EX Apply topically.       Allergies: No Known Allergies  Past Medical History, Surgical history, Social history, and Family History were reviewed and updated.  Review of Systems: As stated in the interim history  Physical Exam:  weight is 152 lb (68.9 kg). His oral temperature is 97.8 F (36.6 C). His blood pressure is 116/70 and his pulse is 88. His respiration is 20 and oxygen saturation is 99%.   Wt Readings from Last 3 Encounters:  10/22/16 152 lb (68.9 kg)  09/23/16 156 lb (70.8 kg)  09/10/16 154 lb 3.2 oz (69.9 kg)    Physical Exam  Constitutional: He is oriented to person, place, and time.  HENT:  Head: Normocephalic and atraumatic.  Mouth/Throat: Oropharynx is clear and moist.  Eyes: Pupils are equal, round,  and reactive to light. EOM are normal.  Neck: Normal range of motion.  Cardiovascular: Normal rate, regular rhythm and normal heart sounds.   Pulmonary/Chest: Effort normal and breath sounds normal.  Abdominal: Soft. Bowel sounds are normal.  Musculoskeletal: Normal range of motion. He exhibits no edema, tenderness or deformity.  Lymphadenopathy:    He has no cervical adenopathy.  Neurological: He is alert and oriented to person, place, and time.  Skin: Skin is warm and dry. No rash noted. No erythema.  Psychiatric: He has a normal mood and affect. His behavior is normal. Judgment and thought content normal.  Vitals reviewed.    Lab Results  Component Value Date   WBC 6.3 10/22/2016   HGB 11.8 (L) 10/22/2016   HCT 34.4  (L) 10/22/2016   MCV 88 10/22/2016   PLT 176 10/22/2016   No results found for: FERRITIN, IRON, TIBC, UIBC, IRONPCTSAT Lab Results  Component Value Date   RBC 3.89 (L) 10/22/2016   Lab Results  Component Value Date   KAPLAMBRATIO 0.89 03/28/2016   Lab Results  Component Value Date   IGGSERUM 1,066 03/28/2016   IGMSERUM 106 03/28/2016   No results found for: Kathrynn Ducking, MSPIKE, SPEI   Chemistry      Component Value Date/Time   NA 142 10/22/2016 1006   K 3.4 10/22/2016 1006   CL 102 10/22/2016 1006   CO2 30 10/22/2016 1006   BUN 5 (L) 10/22/2016 1006   CREATININE 0.9 10/22/2016 1006      Component Value Date/Time   CALCIUM 8.8 10/22/2016 1006   ALKPHOS 119 (H) 10/22/2016 1006   AST 19 10/22/2016 1006   ALT 20 10/22/2016 1006   BILITOT 0.50 10/22/2016 1006      Impression and Plan: Ryan Davis is a 56 yo African-American male with metastatic prostate cancer and widespread skeletal metastasis.  I am oh bothered by the fact that he is losing weight.  We will see about trying some Megace ES for him. Hopefully, this will help him gain weight.  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 This I think will improve over the next month or so. I told him to try some aloe vera lotion.  We will have to see what his PSA is. Hopefully, this is still low so we do not have to make any adjustments with his treatment.  For right now, we will plan to get him back in 4 more weeks.   [name], MD 9/26/201811:02 AM

## 2016-10-23 LAB — PSA: Prostate Specific Ag, Serum: 2.7 ng/mL (ref 0.0–4.0)

## 2016-10-23 LAB — TESTOSTERONE: Testosterone, Serum: <3 ng/dL — ABNORMAL LOW (ref 264–916)

## 2016-11-03 ENCOUNTER — Other Ambulatory Visit: Payer: Self-pay | Admitting: Hematology & Oncology

## 2016-11-05 ENCOUNTER — Other Ambulatory Visit: Payer: Self-pay | Admitting: Hematology & Oncology

## 2016-11-05 DIAGNOSIS — C7951 Secondary malignant neoplasm of bone: Principal | ICD-10-CM

## 2016-11-05 DIAGNOSIS — C61 Malignant neoplasm of prostate: Secondary | ICD-10-CM

## 2016-11-07 ENCOUNTER — Encounter: Payer: Self-pay | Admitting: Oncology

## 2016-11-10 ENCOUNTER — Encounter: Payer: Self-pay | Admitting: Nutrition

## 2016-11-10 ENCOUNTER — Ambulatory Visit
Admission: RE | Admit: 2016-11-10 | Discharge: 2016-11-10 | Disposition: A | Discharge status: Home / Self Care | Payer: BLUE CROSS/BLUE SHIELD | Source: Ambulatory Visit | Attending: Radiation Oncology | Admitting: Radiation Oncology

## 2016-11-10 ENCOUNTER — Encounter: Payer: Self-pay | Admitting: Radiation Oncology

## 2016-11-10 DIAGNOSIS — C61 Malignant neoplasm of prostate: Secondary | ICD-10-CM

## 2016-11-10 DIAGNOSIS — C7951 Secondary malignant neoplasm of bone: Principal | ICD-10-CM

## 2016-11-10 DIAGNOSIS — M549 Dorsalgia, unspecified: Secondary | ICD-10-CM

## 2016-11-10 MED ORDER — OXYCODONE-ACETAMINOPHEN 10-325 MG PO TABS: 1.0000 | ORAL_TABLET | Freq: Four times a day (QID) | ORAL | 0 refills | Status: DC | PRN

## 2016-11-10 NOTE — Progress Notes (Signed)
Ryan Davis is here for follow up after treatment to his lumbar spine.  He continues to have pain in his lower back that radiates down his legs.  He said the pain is worse in the mornings when he is trying to get up.  He is taking percocet 2-3 a day and needs a refill.  He denies having any bowel/bladder issues.  The skin on his abdomen and lower back has hyperpigmentation.  He also had peeling on his lower back.  He was advised to use a vitamin E lotion. He reports having fatigue.  BP 121/87 (BP Location: Left Arm, Patient Position: Sitting)   Pulse 89   Temp 97.6 F (36.4 C) (Oral)   Ht 6\' 2"  (1.88 m)   Wt 149 lb 12.8 oz (67.9 kg)   SpO2 100%   BMI 19.23 kg/m    Wt Readings from Last 3 Encounters:  11/10/16 149 lb 12.8 oz (67.9 kg)  10/22/16 152 lb (68.9 kg)  09/23/16 156 lb (70.8 kg)

## 2016-11-10 NOTE — Progress Notes (Signed)
Provided 3rd complimentary case of Ensure Plus. 

## 2016-11-10 NOTE — Progress Notes (Signed)
Radiation Oncology         (336) [phone] ________________________________  Name: Ryan Davis MRN: [id number]  Date: 11/10/2016  DOB: 1960/03/31  Follow-Up Visit Note  CC: Patient, No Pcp Per  [name], MD    ICD-10-CM   1. Prostate cancer metastatic to bone ([facility]) C61 oxyCODONE-acetaminophen (PERCOCET) 10-325 MG tablet   C79.51   2. Back pain without radiculopathy M54.9 oxyCODONE-acetaminophen (PERCOCET) 10-325 MG tablet    Diagnosis:   Prostate cancer metastatic to bone   Interval Since Last Radiation:  1 months  Narrative:  The patient returns today for routine follow-up.  He still reports some ongoing lower back pain which has largely remained unchanged. He also reports that he has had some hyperpigmentation along the abdomen as well which has caused some pain. He has continued to use radiaplex which has helped with this some and he states that he has now run out of this. He also reports that he has run out of percocet as well. He has had to take his pain medications q6h which has been controlling his pain well. Additionally, he reports that several of his upper front teeth have fallen out while eating. He denies any pain associated with this.                              ALLERGIES:  has No Known Allergies.  Meds: Current Outpatient Prescriptions  Medication Sig Dispense Refill  . bicalutamide (CASODEX) 50 MG tablet Take 1 tablet (50 mg total) by mouth daily. 30 tablet 6  . CARTIA XT 240 MG 24 hr capsule TAKE 1 CAPSULE BY MOUTH EVERY DAY 30 capsule 0  . cyclobenzaprine (FLEXERIL) 10 MG tablet Take 1 tablet (10 mg total) by mouth 2 (two) times daily as needed for muscle spasms. 10 tablet 0  . ibuprofen (ADVIL,MOTRIN) 800 MG tablet Take 1 tablet (800 mg total) by mouth 3 (three) times daily. 21 tablet 0  . megestrol (MEGACE ES) 625 MG/5ML suspension Take 5 mLs (625 mg total) by mouth daily. 150 mL 3  . metoprolol tartrate (LOPRESSOR) 25 MG tablet Take 0.5 tablets (12.5 mg  total) by mouth 2 (two) times daily. 60 tablet 6  . oxyCODONE-acetaminophen (PERCOCET) 10-325 MG tablet Take 1 tablet by mouth every 6 (six) hours as needed for pain. 90 tablet 0  . ZYTIGA 250 MG tablet TAKE 4 TABLETS BY MOUTH EVERY DAY ON AN EMPTY STOMACH EITHER 1 HOUR BEFORE OR 2 HOURS AFTER A MEAL 120 tablet 0  . oxyCODONE (OXY IR/ROXICODONE) 5 MG immediate release tablet   0  . Wound Dressings (SONAFINE EX) Apply topically.     No current facility-administered medications for this encounter.    Facility-Administered Medications Ordered in Other Encounters  Medication Dose Route Frequency Provider Last Rate Last Dose  . SONAFINE emulsion 1 application  1 application Topical BID Gery Pray, MD        Physical Findings: The patient is in no acute distress. Patient is alert and oriented.  height is 6\' 2"  (1.88 m) and weight is 149 lb 12.8 oz (67.9 kg). His oral temperature is 97.6 F (36.4 C). His blood pressure is 121/87 and his pulse is 89. His oxygen saturation is 100%. .  No significant changes. General: Alert and oriented, in no acute distress Lungs are clear to auscultation bilaterally. Heart has regular rate and rhythm. No palpable cervical, supraclavicular, or axillary adenopathy. Abdomen soft,  non-tender, normal bowel sounds.hyperpigmentation changes noted in the patient's radiation portals and some dry desquamation. No moist desquamation. Lower motor strength is 5 out of 5 in the proximal and distal muscle groups  Lab Findings: Lab Results  Component Value Date   WBC 6.3 10/22/2016   HGB 11.8 (L) 10/22/2016   HCT 34.4 (L) 10/22/2016   MCV 88 10/22/2016   PLT 176 10/22/2016    Radiographic Findings: No results found.  Impression:  The patient is recovering from the effects of radiation. Some mild pain improvement. We have refilled his percocet prescription for his pain and also given him radiaplex to continue to treat his radiation-related skin changes. No clinical  evidence of recurrence. He has lost his front maxillary teeth while eating and we will refer him to Dr Enrique Sack of dentistry for management of this.   Plan:  Referral to dentistry. Continue f/u w/ Dr Marin Olp. PRN f/u in radiation oncology.   ____________________________________ -----------------------------------  Blair Promise, PhD, MD  This document serves as a record of services personally performed by Gery Pray, MD. It was created on his behalf by Reola Mosher, a trained medical scribe. The creation of this record is based on the scribe's personal observations and the provider's statements to them. This document has been checked and approved by the attending provider.

## 2016-11-12 ENCOUNTER — Encounter (HOSPITAL_COMMUNITY): Payer: Self-pay | Admitting: Dentistry

## 2016-11-12 ENCOUNTER — Ambulatory Visit (HOSPITAL_COMMUNITY): Payer: Self-pay | Admitting: Dentistry

## 2016-11-12 ENCOUNTER — Other Ambulatory Visit: Payer: Self-pay | Admitting: Hematology & Oncology

## 2016-11-12 VITALS — BP 117/66 | HR 76 | Temp 97.9°F

## 2016-11-12 DIAGNOSIS — K0530 Chronic periodontitis, unspecified: Secondary | ICD-10-CM | POA: Insufficient documentation

## 2016-11-12 DIAGNOSIS — M264 Malocclusion, unspecified: Secondary | ICD-10-CM

## 2016-11-12 DIAGNOSIS — C61 Malignant neoplasm of prostate: Secondary | ICD-10-CM

## 2016-11-12 DIAGNOSIS — C7951 Secondary malignant neoplasm of bone: Secondary | ICD-10-CM | POA: Diagnosis not present

## 2016-11-12 DIAGNOSIS — M270 Developmental disorders of jaws: Secondary | ICD-10-CM | POA: Insufficient documentation

## 2016-11-12 DIAGNOSIS — K08409 Partial loss of teeth, unspecified cause, unspecified class: Secondary | ICD-10-CM

## 2016-11-12 DIAGNOSIS — K083 Retained dental root: Secondary | ICD-10-CM | POA: Insufficient documentation

## 2016-11-12 DIAGNOSIS — K036 Deposits [accretions] on teeth: Secondary | ICD-10-CM

## 2016-11-12 DIAGNOSIS — K045 Chronic apical periodontitis: Secondary | ICD-10-CM

## 2016-11-12 DIAGNOSIS — K0889 Other specified disorders of teeth and supporting structures: Secondary | ICD-10-CM | POA: Insufficient documentation

## 2016-11-12 DIAGNOSIS — K029 Dental caries, unspecified: Secondary | ICD-10-CM | POA: Insufficient documentation

## 2016-11-12 DIAGNOSIS — K06020 Generalized gingival recession, unspecified: Secondary | ICD-10-CM

## 2016-11-12 DIAGNOSIS — M278 Other specified diseases of jaws: Secondary | ICD-10-CM | POA: Insufficient documentation

## 2016-11-12 NOTE — Progress Notes (Signed)
DENTAL CONSULTATION  Date of Consultation:  11/12/2016 Patient Name:   Ryan Davis Date of Birth:   30-Oct-1960 Medical Record Number: [id number]  VITALS: BP 117/66 (BP Location: Left Arm)   Pulse 76   Temp 97.9 F (36.6 C) (Oral)    CHIEF COMPLAINT: Patient referred by Dr. Gery Pray and Dr. Burney Gauze for a dental consultation.  HPI: Ryan Davis is a 56 year old male with previous diagnosis of prostate cancer with metastasis to bone. Patient is under the care of Dr. Marin Olp for his chemotherapy. Patient also is status post radiation therapy with Dr. Sondra Come. Patient referred for evaluation of poor dentition that may affect the patient's systemic health while undergoing active chemotherapy as well as to prevent future osteonecrosis of the jaws related to therapy with IV bisphosphonate's with anticipated invasive dental procedures. To date the patient has received two IV doses of Zometa and 1 dose of Xgeva which does put the patient at risk for osteonecrosis of the jaw with anticipated invasive dental procedures.  The patient currently denies acute toothaches, swellings, or abscesses. Patient recently had tooth #8 fall out when he was eating a sandwich. Patient indicates that he has not seen a dentist since the age of 61. Patient indicates that his teeth are loose and he is able to pull his teeth out by himself. Patient denies having any partial dentures. The patient denies having dental phobia.  PROBLEM LIST: Patient Active Problem List   Diagnosis Date Noted  . Prostate cancer metastatic to bone ([place]) 03/31/2016    Priority: High  . 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[place]) 03/31/2016  . Atrial fibrillation ([place]) 03/30/2016  . Back pain without radiculopathy 03/28/2016  . Atrial fibrillation with RVR ([place]) 03/28/2016    PMH: Past Medical History:  Diagnosis Date  . Back pain without radiculopathy 03/28/2016  . History of radiation therapy 09/16/16-10/06/16   35 Gy  in 14 fractions to the lumbar spine  . Prostate cancer metastatic to bone ([place]) 03/31/2016  . Prostate cancer metastatic to multiple sites [facility]) 03/31/2016    PSH: Past Surgical History:  Procedure Laterality Date  . NO PAST SURGERIES      ALLERGIES: No Known Allergies  MEDICATIONS: Current Outpatient Prescriptions  Medication Sig Dispense Refill  . bicalutamide (CASODEX) 50 MG tablet Take 1 tablet (50 mg total) by mouth daily. 30 tablet 6  . CARTIA XT 240 MG 24 hr capsule TAKE 1 CAPSULE BY MOUTH EVERY DAY 30 capsule 0  . ibuprofen (ADVIL,MOTRIN) 800 MG tablet Take 1 tablet (800 mg total) by mouth 3 (three) times daily. 21 tablet 0  . megestrol (MEGACE ES) 625 MG/5ML suspension Take 5 mLs (625 mg total) by mouth daily. 150 mL 3  . metoprolol tartrate (LOPRESSOR) 25 MG tablet Take 0.5 tablets (12.5 mg total) by mouth 2 (two) times daily. 60 tablet 6  . oxyCODONE-acetaminophen (PERCOCET) 10-325 MG tablet Take 1 tablet by mouth every 6 (six) hours as needed for pain. 90 tablet 0  . Wound Dressings (SONAFINE EX) Apply topically.    Marland Kitchen ZYTIGA 250 MG tablet TAKE 4 TABLETS BY MOUTH EVERY DAY ON AN EMPTY STOMACH EITHER 1 HOUR BEFORE OR 2 HOURS AFTER A MEAL 120 tablet 0  . ZYTIGA 250 MG tablet TAKE 4 TABLETS BY MOUTH EVERY DAY ON AN EMPTY STOMACH EITHER 1 HOUR BEFORE OR 2 HOURS AFTER A MEAL 120 tablet 0  . oxyCODONE (OXY IR/ROXICODONE) 5 MG immediate release tablet   0  No current facility-administered medications for this visit.    Facility-Administered Medications Ordered in Other Visits  Medication Dose Route Frequency Provider Last Rate Last Dose  . SONAFINE emulsion 1 application  1 application Topical BID Gery Pray, MD         LABS: Lab Results  Component Value Date   WBC 6.3 10/22/2016   HGB 11.8 (L) 10/22/2016   HCT 34.4 (L) 10/22/2016   MCV 88 10/22/2016   PLT 176 10/22/2016      Component Value Date/Time   NA 142 10/22/2016 1006   K 3.4 10/22/2016 1006   CL 102  10/22/2016 1006   CO2 30 10/22/2016 1006   GLUCOSE 117 10/22/2016 1006   BUN 5 (L) 10/22/2016 1006   CREATININE 0.9 10/22/2016 1006   CALCIUM 8.8 10/22/2016 1006   GFRNONAA >60 03/28/2016 1701   GFRAA >60 03/28/2016 1701   Lab Results  Component Value Date   INR 1.09 03/28/2016   No results found for: PTT  SOCIAL HISTORY: Social History   Social History  . Marital status: Married    Spouse name: N/A  . Number of children: N/A  . Years of education: N/A   Occupational History  . Not on file.   Social History Main Topics  . Smoking status: Former Smoker    Packs/day: 0.25    Years: 39.00    Types: Cigarettes    Quit date: 10/13/2016  . Smokeless tobacco: Never Used     Comment: 04/18/2016 still smokes  . Alcohol use Yes     Comment: has not drank in 7 months.  He used to drink 40oz  of beer daily  . Drug use: No  . Sexual activity: Not on file   Other Topics Concern  . Not on file   Social History Narrative  . No narrative on file    FAMILY HISTORY: Family History  Problem Relation Age of Onset  . Stroke Sister   . Cancer Neg Hx   . Heart disease Neg Hx   . Diabetes Neg Hx     REVIEW OF SYSTEMS: Reviewed with the patient as per History of present illness. Psych: Patient denies having dental phobia.  DENTAL HISTORY: CHIEF COMPLAINT: Patient referred by Dr. Gery Pray and Dr. Burney Gauze for a dental consultation.  HPI: Ryan Davis is a 56 year old male with previous diagnosis of prostate cancer with metastasis to bone. Patient is under the care of Dr. Marin Olp for his chemotherapy. Patient also is status post radiation therapy with Dr. Sondra Come. Patient referred for evaluation of poor dentition that may affect the patient's systemic health while undergoing active chemotherapy as well as to prevent future osteonecrosis of the jaws related to therapy with IV bisphosphonate's with anticipated invasive dental procedures. To date the patient has received  two IV doses of Zometa and 1 dose of Xgeva which does put the patient at risk for osteonecrosis of the jaw with anticipated invasive dental procedures.  The patient currently denies acute toothaches, swellings, or abscesses. Patient recently had tooth #8 fall out when he was eating a sandwich. Patient indicates that he has not seen a dentist since the age of 52. Patient indicates that his teeth are loose and he is able to pull his teeth out by himself. Patient denies having any partial dentures. The patient denies having dental phobia.  DENTAL EXAMINATION: GENERAL: The patient is a tall, well-developed, well-nourished male in no acute distress. HEAD AND NECK: There is no palpable  neck lymphadenopathy. The patient denies acute TMJ symptoms. INTRAORAL EXAM: Patient has normal saliva.  There is no evidence of oral abscess formation. Patient has bilateral mandibular lingual tori.  The patient has maxillary right and maxillary left buccal exostoses. DENTITION: Patient is missing tooth numbers 2, 4, 18, 19, 30, 31, and 32. There are retained roots in the area of tooth numbers 4, 9, 14, and 15. PERIODONTAL: Patient has chronic, advanced periodontal disease with plaque and calculus accumulations, generalized gingival recession, and generalized tooth mobility. There is moderate to severe bone loss noted. DENTAL CARIES/SUBOPTIMAL RESTORATIONS: Multiple dental caries are noted as per dental charting form. ENDODONTIC: The patient currently denies acute pulpitis symptoms. Patient has multiple areas of periapical pathology and radiolucency associated with tooth numbers 1, 3, 7, 9, 12, 14, 15, 27, and 29. CROWN AND BRIDGE: There are no crown or bridge restorations. PROSTHODONTIC: There are no partial dentures. OCCLUSION: The patient has a poor occlusal scheme secondary to multiple missing teeth, multiple retained root segments, supra-eruption and drifting of the unopposed teeth into the edentulous areas, and lack of  replacement of missing teeth with dental prostheses.  RADIOGRAPHIC INTERPRETATION: An orthopantogram was taken and supplemented with a full series of periapical radiographs. There are multiple missing teeth. There are multiple retained root segments. Dental caries are noted. There is moderate to severe bone loss. There are multiple areas of periapical pathology and radiolucency. Multiple diastemas are noted. There are multiple mandibular radiopacities consistent with the presence of bilateral mandibular lingual tori.  ASSESSMENTS: 1. Prostate cancer with metastasis to bone. 2. History of previous IV bisphosphonate therapy with risk for osteonecrosis of the jaw with anticipated invasive dental procedures. 4. Chronic apical periodontitis 5. Dental caries 6. Multiple retained root segments 7. Chronic periodontitis with bone loss 8. Generalized gingival recession 9. Accretions 10. Multiple loose teeth 11. Multiple missing teeth 12. Multiple diastemas 13. Supra-eruption and drifting of the unopposed teeth into the edentulous areas 14. No history of partial dentures 15. 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 Bilateral maxillary buccal exostoses   PLAN/RECOMMENDATIONS: 1. I discussed the risks, benefits, and complications of various treatment options with the patient in relationship to his medical and dental conditions, previous IV bisphosphonate therapy, and risk for osteonecrosis the jaw with anticipated invasive dental procedures in the operating room with general anesthesia. We discussed various treatment options to include no treatment, multiple extractions with alveoloplasty, pre-prosthetic surgery as indicated, periodontal therapy, dental restorations, root canal therapy, crown and bridge therapy, implant therapy, and replacement of missing teeth as indicated.  We discussed referral to an oral surgeon but the patient refuses referral at this time.  The patient currently wishes to proceed with extraction of remaining teeth with alveoloplasty and pre-prosthetic surgery as needed in the operating room with general anesthesia. This has been scheduled for Wednesday, 11/19/2016 at 7:30 AM at [facility]. Patient has been informed of the potential risk for osteonecrosis of the jaw related to previous IV bisphosphonate therapy. Patient expresses understanding of these potential risks and agrees to proceed with the treatment as described above. I will contact Dr. Marin Olp and discuss discontinuation of future IV bisphosphonate therapy until after adequate healing from the anticipated invasive dental procedures. Dr. Marin Olp is to also monitor the patient for future development of areas of osteonecrosis of the jaw. The patient is to then follow-up with a dentist of his choice for fabrication of upper and lower complete dentures after adequate healing from the  dental extraction procedures.   2. Discussion of findings with medical team and coordination of future medical and dental care as needed.  I spent in excess of  120 minutes during the conduct of this consultation and >50% of this time involved direct face-to-face encounter for counseling and/or coordination of the patient's care.    Lenn Cal, DDS

## 2016-11-12 NOTE — Patient Instructions (Signed)
Patient is scheduled for extraction of remaining teeth with alveoloplasty and pre-prosthetic surgery as needed in the operating with general anesthesia for 11/19/2016 at 7:30 AM at [facility]. Presurgical testing will contact patient for further testing as indicated. Patient was instructed to be an by mouth after midnight for the oral surgery on Wednesday, 11/19/2016.  Dr. Enrique Sack

## 2016-11-14 NOTE — Progress Notes (Signed)
EKG 03-28-16 epic  ECHO 03-29-16 epic  CT chest 04-09-16 epic  CBC,CMP 10-22-16 epic  Cardiology note Dr Acie Fredrickson 03-30-16 epic

## 2016-11-14 NOTE — Patient Instructions (Signed)
Ryan Davis  11/14/2016   Your procedure is scheduled on: 11-19-16  Report to [facility] Main  Entrance Take [place]  elevators to 3rd floor to  [place] at 530AM.    Call this number if you have problems the morning of surgery [phone]    Remember: ONLY 1 PERSON MAY GO WITH YOU TO SHORT STAY TO GET  READY MORNING OF [place].  Do not eat food or drink liquids :After Midnight.     Take these medicines the morning of surgery with A SIP OF WATER: cartia XT, metoprolol                                You may not have any metal on your body including hair pins and              piercings  Do not wear jewelry, make-up, lotions, powders or perfumes, deodorant                     Men may shave face and neck.   Do not bring valuables to the hospital. [place].  Contacts, dentures or bridgework may not be worn into surgery.      Patients discharged the day of surgery will not be allowed to drive home.  Name and phone number of your driver:  Special Instructions: N/A              Please read over the following fact sheets you were given: _____________________________________________________________________            [facility] - Preparing for Surgery Before surgery, you can play an important role.  Because skin is not sterile, your skin needs to be as free of germs as possible.  You can reduce the number of germs on your skin by washing with CHG (chlorahexidine gluconate) soap before surgery.  CHG is an antiseptic cleaner which kills germs and bonds with the skin to continue killing germs even after washing. Please DO NOT use if you have an allergy to CHG or antibacterial soaps.  If your skin becomes reddened/irritated stop using the CHG and inform your nurse when you arrive at Short Stay. Do not shave (including legs and underarms) for at least 48 hours prior to the first CHG shower.  You may  shave your face/neck. Please follow these instructions carefully:  1.  Shower with CHG Soap the night before surgery and the  morning of Surgery.  2.  If you choose to wash your hair, wash your hair first as usual with your  normal  shampoo.  3.  After you shampoo, rinse your hair and body thoroughly to remove the  shampoo.                           4.  Use CHG as you would any other liquid soap.  You can apply chg directly  to the skin and wash                       Gently with a scrungie or clean washcloth.  5.  Apply the CHG Soap to your body ONLY FROM THE  NECK DOWN.   Do not use on face/ open                           Wound or open sores. Avoid contact with eyes, ears mouth and genitals (private parts).                       Wash face,  Genitals (private parts) with your normal soap.             6.  Wash thoroughly, paying special attention to the area where your surgery  will be performed.  7.  Thoroughly rinse your body with warm water from the neck down.  8.  DO NOT shower/wash with your normal soap after using and rinsing off  the CHG Soap.                9.  Pat yourself dry with a clean towel.            10.  Wear clean pajamas.            11.  Place clean sheets on your bed the night of your first shower and do not  sleep with pets. Day of Surgery : Do not apply any lotions/deodorants the morning of surgery.  Please wear clean clothes to the hospital/surgery center.  FAILURE TO FOLLOW THESE INSTRUCTIONS MAY RESULT IN THE CANCELLATION OF YOUR SURGERY PATIENT SIGNATURE_________________________________  NURSE SIGNATURE__________________________________  ________________________________________________________________________

## 2016-11-17 ENCOUNTER — Ambulatory Visit: Payer: BLUE CROSS/BLUE SHIELD | Admitting: Family

## 2016-11-17 ENCOUNTER — Encounter (HOSPITAL_COMMUNITY): Payer: Self-pay

## 2016-11-17 ENCOUNTER — Other Ambulatory Visit: Payer: BLUE CROSS/BLUE SHIELD

## 2016-11-17 ENCOUNTER — Encounter (HOSPITAL_COMMUNITY)
Admission: RE | Admit: 2016-11-17 | Discharge: 2016-11-17 | Disposition: A | Discharge status: Home / Self Care | Payer: BLUE CROSS/BLUE SHIELD | Source: Ambulatory Visit | Attending: Dentistry | Admitting: Dentistry

## 2016-11-17 DIAGNOSIS — L598 Other specified disorders of the skin and subcutaneous tissue related to radiation: Secondary | ICD-10-CM | POA: Diagnosis not present

## 2016-11-17 DIAGNOSIS — K045 Chronic apical periodontitis: Secondary | ICD-10-CM | POA: Diagnosis present

## 2016-11-17 DIAGNOSIS — M278 Other specified diseases of jaws: Secondary | ICD-10-CM | POA: Diagnosis not present

## 2016-11-17 DIAGNOSIS — F17200 Nicotine dependence, unspecified, uncomplicated: Secondary | ICD-10-CM | POA: Diagnosis not present

## 2016-11-17 DIAGNOSIS — K029 Dental caries, unspecified: Secondary | ICD-10-CM | POA: Diagnosis not present

## 2016-11-17 DIAGNOSIS — I4891 Unspecified atrial fibrillation: Secondary | ICD-10-CM | POA: Diagnosis not present

## 2016-11-17 DIAGNOSIS — K0889 Other specified disorders of teeth and supporting structures: Secondary | ICD-10-CM | POA: Diagnosis not present

## 2016-11-17 DIAGNOSIS — C61 Malignant neoplasm of prostate: Secondary | ICD-10-CM | POA: Diagnosis not present

## 2016-11-17 DIAGNOSIS — C7951 Secondary malignant neoplasm of bone: Secondary | ICD-10-CM | POA: Diagnosis not present

## 2016-11-17 DIAGNOSIS — Z79899 Other long term (current) drug therapy: Secondary | ICD-10-CM | POA: Diagnosis not present

## 2016-11-17 HISTORY — DX: Unspecified atrial fibrillation: I48.91

## 2016-11-17 HISTORY — DX: Nocturia: R35.1

## 2016-11-17 HISTORY — DX: Cardiac arrhythmia, unspecified: I49.9

## 2016-11-17 HISTORY — DX: Dyspnea, unspecified: R06.00

## 2016-11-17 NOTE — Progress Notes (Addendum)
RN CONTACTED ANESTHESIA TO FULFILL CONSULT ORDERED BY SURGEON. SPOKE WITH DR Harrell Gave MOSER AND RELAYED WRITTEN REASON FOR CONSULT. PER DR MOSER, SINCE PATIENT DENIES ANY RADIATION TO NECK AND/OR FACE, NOR PREVIOUS SURGERIES OR INJURY TO NECK OR FACE, PATIENT OK TO PROCEED WITH SURGERY.   RN ALSO INQUIRED IF NEW EKG SHOULD BE COLLECTED TODAY SINCE LAST SHOWS AFIB RHYTHM . PER MOSER, REASONABLE TO PERFORM NEW EKG SINCE IT HAS BEEN GREATER THAN 6 MONTHS WITHOUT AN EKG SINCE PATIENT ADMISSION IN MARCH WITH AFIB DX .

## 2016-11-19 ENCOUNTER — Ambulatory Visit (HOSPITAL_COMMUNITY): Payer: BLUE CROSS/BLUE SHIELD | Admitting: Certified Registered Nurse Anesthetist

## 2016-11-19 ENCOUNTER — Encounter (HOSPITAL_COMMUNITY)
Admission: RE | Disposition: A | Discharge status: Home / Self Care | Payer: Self-pay | Source: Ambulatory Visit | Attending: Dentistry

## 2016-11-19 ENCOUNTER — Encounter (HOSPITAL_COMMUNITY): Payer: Self-pay

## 2016-11-19 ENCOUNTER — Ambulatory Visit: Payer: BLUE CROSS/BLUE SHIELD

## 2016-11-19 ENCOUNTER — Ambulatory Visit: Payer: BLUE CROSS/BLUE SHIELD | Admitting: Family

## 2016-11-19 ENCOUNTER — Ambulatory Visit (HOSPITAL_COMMUNITY)
Admission: RE | Admit: 2016-11-19 | Discharge: 2016-11-19 | Disposition: A | Discharge status: Home / Self Care | Payer: BLUE CROSS/BLUE SHIELD | Source: Ambulatory Visit | Attending: Dentistry | Admitting: Dentistry

## 2016-11-19 ENCOUNTER — Other Ambulatory Visit: Payer: BLUE CROSS/BLUE SHIELD

## 2016-11-19 DIAGNOSIS — C61 Malignant neoplasm of prostate: Secondary | ICD-10-CM

## 2016-11-19 DIAGNOSIS — M278 Other specified diseases of jaws: Secondary | ICD-10-CM

## 2016-11-19 DIAGNOSIS — C7951 Secondary malignant neoplasm of bone: Secondary | ICD-10-CM

## 2016-11-19 DIAGNOSIS — K0889 Other specified disorders of teeth and supporting structures: Secondary | ICD-10-CM | POA: Insufficient documentation

## 2016-11-19 DIAGNOSIS — K0530 Chronic periodontitis, unspecified: Secondary | ICD-10-CM

## 2016-11-19 DIAGNOSIS — K029 Dental caries, unspecified: Secondary | ICD-10-CM

## 2016-11-19 DIAGNOSIS — Z79899 Other long term (current) drug therapy: Secondary | ICD-10-CM | POA: Insufficient documentation

## 2016-11-19 DIAGNOSIS — K045 Chronic apical periodontitis: Secondary | ICD-10-CM | POA: Diagnosis not present

## 2016-11-19 DIAGNOSIS — I4891 Unspecified atrial fibrillation: Secondary | ICD-10-CM | POA: Insufficient documentation

## 2016-11-19 DIAGNOSIS — M270 Developmental disorders of jaws: Secondary | ICD-10-CM

## 2016-11-19 DIAGNOSIS — L598 Other specified disorders of the skin and subcutaneous tissue related to radiation: Secondary | ICD-10-CM | POA: Insufficient documentation

## 2016-11-19 DIAGNOSIS — K083 Retained dental root: Secondary | ICD-10-CM

## 2016-11-19 DIAGNOSIS — F17200 Nicotine dependence, unspecified, uncomplicated: Secondary | ICD-10-CM | POA: Insufficient documentation

## 2016-11-19 HISTORY — PX: MULTIPLE EXTRACTIONS WITH ALVEOLOPLASTY: SHX5342

## 2016-11-19 LAB — HEMOGLOBIN AND HEMATOCRIT, BLOOD
HEMATOCRIT: 30.8 % — AB (ref 39.0–52.0)
HEMOGLOBIN: 10.5 g/dL — AB (ref 13.0–17.0)

## 2016-11-19 SURGERY — MULTIPLE EXTRACTION WITH ALVEOLOPLASTY
Anesthesia: General

## 2016-11-19 MED ORDER — FENTANYL CITRATE (PF) 100 MCG/2ML IJ SOLN
INTRAMUSCULAR | Status: DC | PRN
  Administered 2016-11-19: 100 ug via INTRAVENOUS
  Administered 2016-11-19 (×2): 25 ug via INTRAVENOUS
  Administered 2016-11-19: 50 ug via INTRAVENOUS

## 2016-11-19 MED ORDER — LIDOCAINE-EPINEPHRINE 2 %-1:100000 IJ SOLN
INTRAMUSCULAR | Status: AC
  Filled 2016-11-19: qty 3.4

## 2016-11-19 MED ORDER — FENTANYL CITRATE (PF) 100 MCG/2ML IJ SOLN
25.0000 ug | INTRAMUSCULAR | Status: DC | PRN
  Administered 2016-11-19 (×2): 50 ug via INTRAVENOUS

## 2016-11-19 MED ORDER — LIDOCAINE-EPINEPHRINE 2 %-1:100000 IJ SOLN
INTRAMUSCULAR | Status: AC
  Filled 2016-11-19: qty 5.1

## 2016-11-19 MED ORDER — OXYCODONE-ACETAMINOPHEN 5-325 MG PO TABS: 1.0000 | ORAL_TABLET | Freq: Four times a day (QID) | ORAL | Status: DC | PRN

## 2016-11-19 MED ORDER — FENTANYL CITRATE (PF) 100 MCG/2ML IJ SOLN
INTRAMUSCULAR | Status: AC
  Filled 2016-11-19: qty 2

## 2016-11-19 MED ORDER — AMINOCAPROIC ACID SOLUTION 5% (50 MG/ML)
10.0000 mL | ORAL | Status: DC
  Filled 2016-11-19: qty 100

## 2016-11-19 MED ORDER — PROPOFOL 10 MG/ML IV BOLUS
INTRAVENOUS | Status: AC
Start: 2016-11-19 — End: 2016-11-19
  Filled 2016-11-19: qty 20

## 2016-11-19 MED ORDER — ISOPROPYL ALCOHOL 70 % SOLN
Status: DC | PRN
  Administered 2016-11-19: 1 via TOPICAL

## 2016-11-19 MED ORDER — PROPOFOL 10 MG/ML IV BOLUS
INTRAVENOUS | Status: DC | PRN
  Administered 2016-11-19: 200 mg via INTRAVENOUS

## 2016-11-19 MED ORDER — MIDAZOLAM HCL 5 MG/5ML IJ SOLN
INTRAMUSCULAR | Status: DC | PRN
  Administered 2016-11-19: 2 mg via INTRAVENOUS

## 2016-11-19 MED ORDER — SUCCINYLCHOLINE CHLORIDE 200 MG/10ML IV SOSY
PREFILLED_SYRINGE | INTRAVENOUS | Status: DC | PRN
  Administered 2016-11-19: 100 mg via INTRAVENOUS

## 2016-11-19 MED ORDER — LIDOCAINE 2% (20 MG/ML) 5 ML SYRINGE
INTRAMUSCULAR | Status: AC
  Filled 2016-11-19: qty 5

## 2016-11-19 MED ORDER — LIDOCAINE-EPINEPHRINE 2 %-1:100000 IJ SOLN
INTRAMUSCULAR | Status: DC | PRN
  Administered 2016-11-19: 13.6 mL

## 2016-11-19 MED ORDER — BUPIVACAINE-EPINEPHRINE (PF) 0.5% -1:200000 IJ SOLN
INTRAMUSCULAR | Status: DC | PRN
  Administered 2016-11-19: 3.6 mL

## 2016-11-19 MED ORDER — OXYMETAZOLINE HCL 0.05 % NA SOLN
NASAL | Status: AC
  Filled 2016-11-19: qty 15

## 2016-11-19 MED ORDER — LACTATED RINGERS IV SOLN: INTRAVENOUS | Status: DC

## 2016-11-19 MED ORDER — METOCLOPRAMIDE HCL 5 MG/ML IJ SOLN: 10.0000 mg | Freq: Once | INTRAMUSCULAR | Status: DC | PRN

## 2016-11-19 MED ORDER — OXYCODONE-ACETAMINOPHEN 5-325 MG PO TABS: ORAL_TABLET | ORAL | 0 refills | Status: DC

## 2016-11-19 MED ORDER — 0.9 % SODIUM CHLORIDE (POUR BTL) OPTIME
TOPICAL | Status: DC | PRN
  Administered 2016-11-19: 1000 mL

## 2016-11-19 MED ORDER — ONDANSETRON HCL 4 MG/2ML IJ SOLN
INTRAMUSCULAR | Status: DC | PRN
  Administered 2016-11-19: 4 mg via INTRAVENOUS

## 2016-11-19 MED ORDER — MEPERIDINE HCL 50 MG/ML IJ SOLN: 6.2500 mg | INTRAMUSCULAR | Status: DC | PRN

## 2016-11-19 MED ORDER — MIDAZOLAM HCL 2 MG/2ML IJ SOLN
INTRAMUSCULAR | Status: AC
  Filled 2016-11-19: qty 2

## 2016-11-19 MED ORDER — CEFAZOLIN SODIUM-DEXTROSE 2-4 GM/100ML-% IV SOLN
INTRAVENOUS | Status: AC
  Filled 2016-11-19: qty 100

## 2016-11-19 MED ORDER — BUPIVACAINE-EPINEPHRINE (PF) 0.5% -1:200000 IJ SOLN
INTRAMUSCULAR | Status: AC
  Filled 2016-11-19: qty 3.6

## 2016-11-19 MED ORDER — ROCURONIUM BROMIDE 100 MG/10ML IV SOLN
INTRAVENOUS | Status: DC | PRN
  Administered 2016-11-19: 50 mg via INTRAVENOUS

## 2016-11-19 MED ORDER — CEFAZOLIN SODIUM-DEXTROSE 2-4 GM/100ML-% IV SOLN
2.0000 g | Freq: Once | INTRAVENOUS | Status: AC
  Administered 2016-11-19: 2 g via INTRAVENOUS

## 2016-11-19 MED ORDER — SUCCINYLCHOLINE CHLORIDE 200 MG/10ML IV SOSY
PREFILLED_SYRINGE | INTRAVENOUS | Status: AC
  Filled 2016-11-19: qty 10

## 2016-11-19 MED ORDER — STERILE WATER FOR IRRIGATION IR SOLN
Status: DC | PRN
  Administered 2016-11-19: 1000 mL

## 2016-11-19 MED ORDER — DEXAMETHASONE SODIUM PHOSPHATE 10 MG/ML IJ SOLN
INTRAMUSCULAR | Status: DC | PRN
  Administered 2016-11-19: 10 mg via INTRAVENOUS

## 2016-11-19 MED ORDER — LACTATED RINGERS IV SOLN
INTRAVENOUS | Status: DC | PRN
  Administered 2016-11-19 (×3): via INTRAVENOUS

## 2016-11-19 MED ORDER — ISOPROPYL ALCOHOL 70 % SOLN
Status: AC
  Filled 2016-11-19: qty 480

## 2016-11-19 MED ORDER — LIDOCAINE HCL (CARDIAC) 20 MG/ML IV SOLN
INTRAVENOUS | Status: DC | PRN
  Administered 2016-11-19: 100 mg via INTRATRACHEAL

## 2016-11-19 SURGICAL SUPPLY — 31 items
ATTRACTOMAT 16X20 MAGNETIC DRP (DRAPES) ×2 IMPLANT
BAG SPEC THK2 15X12 ZIP CLS (MISCELLANEOUS)
BAG ZIPLOCK 12X15 (MISCELLANEOUS) IMPLANT
BANDAGE EYE OVAL (MISCELLANEOUS) ×4 IMPLANT
BLADE SURG 15 STRL LF DISP TIS (BLADE) ×2 IMPLANT
BLADE SURG 15 STRL SS (BLADE) ×4
CANNULA VESSEL W/WING WO/VALVE (CANNULA) ×2 IMPLANT
COVER SURGICAL LIGHT HANDLE (MISCELLANEOUS) ×2 IMPLANT
GAUZE SPONGE 4X4 12PLY STRL (GAUZE/BANDAGES/DRESSINGS) ×2 IMPLANT
GAUZE SPONGE 4X4 16PLY XRAY LF (GAUZE/BANDAGES/DRESSINGS) ×4 IMPLANT
GLOVE BIOGEL PI IND STRL 6 (GLOVE) ×1 IMPLANT
GLOVE BIOGEL PI INDICATOR 6 (GLOVE) ×1
GLOVE SURG ORTHO 8.0 STRL STRW (GLOVE) ×2 IMPLANT
GLOVE SURG SS PI 6.0 STRL IVOR (GLOVE) ×2 IMPLANT
GOWN STRL REUS W/TWL 2XL LVL3 (GOWN DISPOSABLE) ×2 IMPLANT
GOWN STRL REUS W/TWL LRG LVL3 (GOWN DISPOSABLE) ×2 IMPLANT
KIT BASIN OR (CUSTOM PROCEDURE TRAY) ×2 IMPLANT
NDL DENTAL RB 25GX1.25 (NEEDLE) IMPLANT
NEEDLE DENTAL RB 25GX1.25 (NEEDLE) IMPLANT
PACK EENT SPLIT (PACKS) ×2 IMPLANT
PACKING VAGINAL (PACKING) ×2 IMPLANT
SPONGE SURGIFOAM ABS GEL 12-7 (HEMOSTASIS) IMPLANT
SUCTION FRAZIER HANDLE 12FR (TUBING)
SUCTION TUBE FRAZIER 12FR DISP (TUBING) IMPLANT
SUT CHROMIC 3 0 PS 2 (SUTURE) ×7 IMPLANT
SUT CHROMIC 4 0 P 3 18 (SUTURE) IMPLANT
SYR 50ML LL SCALE MARK (SYRINGE) ×2 IMPLANT
TOWEL OR 17X26 10 PK STRL BLUE (TOWEL DISPOSABLE) ×2 IMPLANT
TUBING CONNECTING 10 (TUBING) ×2 IMPLANT
WATER TABLETS ICX (MISCELLANEOUS) ×2 IMPLANT
YANKAUER SUCT BULB TIP NO VENT (SUCTIONS) ×2 IMPLANT

## 2016-11-19 NOTE — H&P (Signed)
11/19/2016  Patient:            Ryan Davis Date of Birth:  11-Nov-1960 MRN:                [id number]   BP 121/86   Pulse 85   Temp 97.7 F (36.5 C) (Oral)   Resp 18   Ht 6\' 2"  (1.88 m)   Wt 156 lb (70.8 kg)   SpO2 100%   BMI 20.03 kg/m   Ryan Davis presents for extraction remaining teeth with alveoloplasty and pre-prosthetic surgery as needed in the operative room with general anesthesia. Patient denies any acute medical or dental changes. Please see note from Dr. Marin Olp dated 10/22/2016 to act as the H&P for the dental operating room procedure.  Ryan Davis, DDS    Ryan Napoleon, MD  Oncology  Expand All Collapse All   [] [facility] copied text  Hematology and Oncology Follow Up Visit  Ryan Davis [id number] 06/17/1960 56 y.o. 10/22/2016   Principle Diagnosis:  Metastatic prostate cancer - androgen sensitive  Current Therapy:        Lupron 11.5 mg IM every 3 month - next dose to be given 11/2016 Casodex 50 mg by mouth daily Zytiga 1000 mg by mouth daily/prednisone 10 mg by mouth daily -   started on 04/20/2016 Zometa 4 mg IV q month - start on 08/2016              Interim History:  Ryan Davis is here today for follow-up. He completed his radiation. He didn't completed this 2 weeks ago. This is to his back. 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  His last PSA was 1.15. This actually is up a little bit. We are going to have to watch this closely.  His weight is down. His appetite is a little bit down. I will try some Megace ES to see if this can help.  He's had no nausea or vomiting. He's had no bleeding. He's had no diarrhea.  There's been no fever.  Unfortunately, his wife has not done well. She did back in the hospital with hypoglycemia.  Currently, his pain seems to be under fairly good control. Hopefully, the radiation will be able to help with his back.  Overall, his performance status is ECOG 1.   Medications:    Allergies as of 10/22/2016   No Known Allergies                 Medication List            Accurate as of 10/22/16 11:02 AM. Always use your most recent med list.           abiraterone Acetate 250 MG tablet Commonly known as:  ZYTIGA Take 4 tablets (1,000 mg total) by mouth daily. Take on an empty stomach 1 hour before or 2 hours after a meal   bicalutamide 50 MG tablet Commonly known as:  CASODEX TAKE 1 TABLET BY MOUTH DAILY   cyclobenzaprine 10 MG tablet Commonly known as:  FLEXERIL Take 1 tablet (10 mg total) by mouth 2 (two) times daily as needed for muscle spasms.   diltiazem 240 MG 24 hr capsule Commonly known as:  CARTIA XT TAKE 1 CAPSULE BY MOUTH EVERY DAY   ibuprofen 800 MG tablet Commonly known as:  ADVIL,MOTRIN Take 1 tablet (800 mg total) by mouth 3 (three) times daily.   metoprolol tartrate 25 MG tablet Commonly known as:  LOPRESSOR Take 0.5 tablets (12.5 mg total) by mouth 2 (two) times daily.   oxyCODONE 5 MG immediate release tablet Commonly known as:  Oxy IR/ROXICODONE   oxyCODONE-acetaminophen 10-325 MG tablet Commonly known as:  PERCOCET Take 1 tablet by mouth every 6 (six) hours as needed for pain.   SONAFINE EX Apply topically.       Allergies: No Known Allergies  Past Medical History, Surgical history, Social history, and Family History were reviewed and updated.  Review of Systems: As stated in the interim history  Physical Exam:  weight is 152 lb (68.9 kg). His oral temperature is 97.8 F (36.6 C). His blood pressure is 116/70 and his pulse is 88. His respiration is 20 and oxygen saturation is 99%.      Wt Readings from Last 3 Encounters:  10/22/16 152 lb (68.9 kg)  09/23/16 156 lb (70.8 kg)  09/10/16 154 lb 3.2 oz (69.9 kg)    Physical Exam  Constitutional: He is oriented to person, place, and time.  HENT:  Head: Normocephalic and atraumatic.  Mouth/Throat: Oropharynx is clear and moist.   Eyes: Pupils are equal, round, and reactive to light. EOM are normal.  Neck: Normal range of motion.  Cardiovascular: Normal rate, regular rhythm and normal heart sounds.   Pulmonary/Chest: Effort normal and breath sounds normal.  Abdominal: Soft. Bowel sounds are normal.  Musculoskeletal: Normal range of motion. He exhibits no edema, tenderness or deformity.  Lymphadenopathy:    He has no cervical adenopathy.  Neurological: He is alert and oriented to person, place, and time.  Skin: Skin is warm and dry. No rash noted. No erythema.  Psychiatric: He has a normal mood and affect. His behavior is normal. Judgment and thought content normal.  Vitals reviewed.    RecentLabs       Lab Results  Component Value Date   WBC 6.3 10/22/2016   HGB 11.8 (L) 10/22/2016   HCT 34.4 (L) 10/22/2016   MCV 88 10/22/2016   PLT 176 10/22/2016     RecentLabs  No results found for: FERRITIN, IRON, TIBC, UIBC, IRONPCTSAT   RecentLabs       Lab Results  Component Value Date   RBC 3.89 (L) 10/22/2016     RecentLabs       Lab Results  Component Value Date   KAPLAMBRATIO 0.89 03/28/2016     RecentLabs       Lab Results  Component Value Date   IGGSERUM 1,066 03/28/2016   IGMSERUM 106 03/28/2016     RecentLabs  No results found for: Ronnald Ramp, A1GS, A2GS, BETS, BETA2SER, GAMS, MSPIKE, SPEI     Chemistry   Labs(Brief)          Component Value Date/Time   NA 142 10/22/2016 1006   K 3.4 10/22/2016 1006   CL 102 10/22/2016 1006   CO2 30 10/22/2016 1006   BUN 5 (L) 10/22/2016 1006   CREATININE 0.9 10/22/2016 1006     Labs(Brief)          Component Value Date/Time   CALCIUM 8.8 10/22/2016 1006   ALKPHOS 119 (H) 10/22/2016 1006   AST 19 10/22/2016 1006   ALT 20 10/22/2016 1006   BILITOT 0.50 10/22/2016 1006        Impression and Plan: RyanDavis a 56 yo African-American male with metastatic prostate cancer and  widespread skeletal metastasis.  I am oh bothered by the fact that he is losing weight.  We will see about trying some  Megace ES for him. Hopefully, this will help him gain weight.  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 This I think will improve over the next month or so. I told him to try some aloe vera lotion.  We will have to see what his PSA is. Hopefully, this is still low so we do not have to make any adjustments with his treatment.  For right now, we will plan to get him back in 4 more weeks.   Ryan Napoleon, MD 9/26/201811:02 AM    Electronically signed by Ryan Napoleon, MD at 10/22/2016 11:31 AM

## 2016-11-19 NOTE — Progress Notes (Signed)
Lab results called to Dr Enrique Sack.

## 2016-11-19 NOTE — Anesthesia Procedure Notes (Signed)
Procedure Name: Intubation Date/Time: 11/19/2016 7:38 AM Performed by: [place] Ryan Davis C Pre-anesthesia Checklist: Patient identified, Emergency Drugs available, Suction available and Patient being monitored Patient Re-evaluated:Patient Re-evaluated prior to induction Oxygen Delivery Method: Circle system utilized Preoxygenation: Pre-oxygenation with 100% oxygen Induction Type: IV induction Ventilation: Mask ventilation without difficulty and Nasal airway inserted- appropriate to patient size Laryngoscope Size: Mac and 4 Grade View: Grade II Nasal Tubes: Nasal Rae, Magill forceps- large, utilized and Right Tube size: 7.0 mm Number of attempts: 1 Placement Confirmation: ETT inserted through vocal cords under direct vision,  positive ETCO2 and breath sounds checked- equal and bilateral Tube secured with: Tape Dental Injury: Teeth and Oropharynx as per pre-operative assessment

## 2016-11-19 NOTE — Anesthesia Preprocedure Evaluation (Signed)
Anesthesia Evaluation  Patient identified by MRN, date of birth, ID band Patient awake    Reviewed: Allergy & Precautions, NPO status , Patient's Chart, lab work & pertinent test results  Airway Mallampati: II  TM Distance: >3 FB Neck ROM: Full    Dental no notable dental hx. (+) Loose, Poor Dentition   Pulmonary Current Smoker,    Pulmonary exam normal breath sounds clear to auscultation       Cardiovascular Normal cardiovascular exam+ dysrhythmias Atrial Fibrillation  Rhythm:Regular Rate:Normal     Neuro/Psych negative neurological ROS  negative psych ROS   GI/Hepatic negative GI ROS, Neg liver ROS,   Endo/Other  negative endocrine ROS  Renal/GU negative Renal ROS  negative genitourinary   Musculoskeletal negative musculoskeletal ROS (+)   Abdominal   Peds negative pediatric ROS (+)  Hematology negative hematology ROS (+)   Anesthesia Other Findings   Reproductive/Obstetrics negative OB ROS                             Anesthesia Physical Anesthesia Plan  ASA: III  Anesthesia Plan: General   Post-op Pain Management:    Induction: Intravenous  PONV Risk Score and Plan: 1 and Ondansetron and Dexamethasone  Airway Management Planned: Nasal ETT  Additional Equipment:   Intra-op Plan:   Post-operative Plan: Extubation in OR  Informed Consent: I have reviewed the patients History and Physical, chart, labs and discussed the procedure including the risks, benefits and alternatives for the proposed anesthesia with the patient or authorized representative who has indicated his/her understanding and acceptance.   Dental advisory given  Plan Discussed with: CRNA  Anesthesia Plan Comments:         Anesthesia Quick Evaluation

## 2016-11-19 NOTE — Transfer of Care (Signed)
Immediate Anesthesia Transfer of Care Note  Patient: Ryan Davis  Procedure(s) Performed: EXTRACTION OF TOOTH #'S 1,3,5-7,9-17, AND 20- 29 WITH ALVEOLOPLASTY, BILATERAL MANDIBULAR TORI REDUCTIONS AND BILATERAL MAXILLARY BUCCAL EXOSTOSES REDUCTIONS (N/A )  Patient Location: PACU  Anesthesia Type:General  Level of Consciousness: awake, alert  and oriented  Airway & Oxygen Therapy: Patient Spontanous Breathing and Patient connected to face mask oxygen  Post-op Assessment: Report given to RN and Post -op Vital signs reviewed and stable  Post vital signs: Reviewed and stable  Last Vitals:  Vitals:   11/19/16 0515  BP: 121/86  Pulse: 85  Resp: 18  Temp: 36.5 C  SpO2: 100%    Last Pain:  Vitals:   11/19/16 0546  TempSrc:   PainSc: 0-No pain         Complications: No apparent anesthesia complications

## 2016-11-19 NOTE — Op Note (Signed)
OPERATIVE REPORT  Patient:            Ryan Davis Date of Birth:  [birth year] MRN:                [id number]   DATE OF PROCEDURE:  11/19/2016  PREOPERATIVE DIAGNOSES: 1. Prostate cancer with bone metastases 2. Chronic apical periodontitis 3. Dental caries 4. Retained root segments 5. Chronic periodontitis  6. Loose teeth 7. Bilateral mandibular lingual tori 8. Bilateral maxillary buccal exostoses  POSTOPERATIVE DIAGNOSES: 1. Prostate cancer with bone metastases 2. Chronic apical periodontitis 3. Dental caries 4. Retained root segments 5. Chronic periodontitis  6. Loose teeth 7. Bilateral mandibular lingual tori 8. Bilateral maxillary buccal exostoses  OPERATIONS: 1. Multiple extraction of tooth numbers 1, 3, 5 - 7, 9-17, and 20-29 2. 4 Quadrants of alveoloplasty 3. Bilateral mandibular lingual tori reductions 4. Bilateral maxillary buccal exostoses reductions   SURGEON: Lenn Cal, DDS  ASSISTANT: Camie Patience, (dental assistant)  ANESTHESIA: General anesthesia via nasoendotracheal tube.  MEDICATIONS: 1. Ancef 2 g IV prior to invasive dental procedures. 2. Local anesthesia with a total utilization of 8 carpules each containing 34 mg of lidocaine with 0.017 mg of epinephrine as well as 2 carpules each containing 9 mg of bupivacaine with 0.009 mg of epinephrine.  SPECIMENS: There are 24 teeth that were discarded.  DRAINS: None  CULTURES: None  COMPLICATIONS: None   ESTIMATED BLOOD LOSS: 300 mLs.  INTRAVENOUS FLUIDS: 1300 mLs of Lactated ringers solution.  INDICATIONS: The patient was recently diagnosed with prostate cancer with bone metastases.  A dental consultation was then requested to evaluate poor dentition.  The patient was examined and treatment planned for extraction of remaining teeth with alveoloplasty and pre-prosthetic surgery as needed in the operating room with general anesthesia.  This treatment plan was formulated to decrease the  risks and complications associated with dental infection from affecting the patient's systemic health and to assist in further future prosthodontic rehabilitation.  OPERATIVE FINDINGS: Patient was examined operating room number 2.  The teeth were identified for extraction. The patient was noted be affected by chronic apical periodontitis, dental caries, retained root segments, chronic periodontitis, loose teeth, bilateral maxillary buccal exostoses, and bilateral mandibular lingual tori.  DESCRIPTION OF PROCEDURE: Patient was brought to the main operating room number 2. Patient was then placed in the supine position on the operating table. General anesthesia was then induced per the anesthesia team. The patient was then prepped and draped in the usual manner for dental medicine procedure. A timeout was performed. The patient was identified and procedures were verified. A throat pack was placed at this time. The oral cavity was then thoroughly examined with the findings noted above. The patient was then ready for dental medicine procedure as follows:  Local anesthesia was then administered sequentially with a total utilization of 8 carpules each containing 34 mg of lidocaine with 0.017 mg of epinephrine as well as 2 carpules  each containing 9 mg bupivacaine with 0.009 mg of epinephrine.  The Maxillary left and right quadrants first approached. Anesthesia was then delivered utilizing infiltration with lidocaine with epinephrine. A #15 blade incision was then made from the maxillary right tuberosity and extended and the maxillary left tuberosity.  A  surgical flap was then carefully reflected. The maxillary teeth were then subluxated with a series of straight elevators. Tooth numbers 1, 3, 5 -7, and 9-16 were then removed with a 150 forceps without complications. Alveoloplasty was then performed utilizing a  ronguers and bone file. At this point time the flaps were further reflected to expose the bilateral  maxillary buccal exostoses. These were then reduced utilizing a rongeurs and bone file appropriately. Further alveoloplasty was then performed utilizing a rongeurs and bone file. The tissues were approximated and trimmed appropriately. The surgical site was then irrigated with copious amounts of sterile saline 4. The maxillary right surgical site was then closed from the maxillary right tuberosity and extended the mesial #8 utilizing 3-0 chromic gut suture in a continuous interrupted suture technique 1. The maxillary left surgical site was then closed from the maxillary left tuberosity and extended the mesial #9 utilizing 3-0 chromic gut suture in a continuous interrupted suture technique 1.  At this point time, the mandibular quadrants were approached. The patient was given bilateral inferior alveolar nerve blocks and long buccal nerve blocks utilizing the bupivacaine with epinephrine. Further infiltration was then achieved utilizing the lidocaine with epinephrine. A 15 blade incision was then made from the distal of number 17 and extended to the distal of #30.  A surgical flap was then carefully reflected. The remaining lower teeth were then subluxated with a series of straight elevators. Tooth #17 and removed with a 23 forceps without complications. Tooth numbers 20-29 were then removed with a 151 forceps without complications. Alveoloplasty was then performed utilizing a rongeurs and bone file. The surgical flaps were then further reflected to expose the bilateral mandibular lingual tori. The mandibular tori were then reduced utilizing a surgical handpiece and bur and copious amounts of sterile water. Further alveoloplasty was then performed utilizing a rongeurs and bone file. The tissues were approximated and trimmed appropriately. The surgical sites were then irrigated with copious amounts of sterile saline 4. The mandibular left surgical site was then closed from the distal of #17 and extended to the  mesial #24 utilizing 3-0 chromic gut suture in a continuous interrupted suture technique 1. The mandibular right surgical site was then closed from the distal of #30 and extended the mesial #25 utilizing 3-0 chromic gut suture in a continuous interrupted suture technique 1. 3 interrupted sutures then placed to further closed surgical site utilizing 3-0 chromic gut material.  At this point time, the entire mouth was irrigated with copious amounts of sterile saline. The patient was examined for complications, seeing none, the dental medicine procedure was deemed to be complete. The throat pack was removed at this time. An oral airway was then placed at the request of the anesthesia team. A series of 4 x 4 gauze moistened with Amicar 5% rinse were placed in the mouth to aid hemostasis. The patient was then handed over to the anesthesia team for final disposition. After an appropriate amount of time, the patient was extubated and taken to the postanesthsia care unit in good condition. All counts were correct for the dental medicine procedure. The patient is to continue the Amicar 5% rinses postoperatively. Patient is to rinse with 10 ML's every hour for 10 hours in a swish and spit manner. The patient will be prescribed Percocet 5/325 for pain medication. Patient is to take one to 2 tablets by mouth every 6 hours as needed for moderate to severe pain. Patient may supplement his dental pain medication regimen utilizing ibuprofen 200 mg (over-the-counter). Patient may take one to 2 tablets every 6 hours as needed for pain. Patient is to return to Dental Medicine for evaluation of healing and suture removal in 7-10 days.   Lenn Cal, DDS.

## 2016-11-19 NOTE — Anesthesia Postprocedure Evaluation (Signed)
Anesthesia Post Note  Patient: Ryan Davis  Procedure(s) Performed: EXTRACTION OF TOOTH #'S 1,3,5-7,9-17, AND 20- 29 WITH ALVEOLOPLASTY, BILATERAL MANDIBULAR TORI REDUCTIONS AND BILATERAL MAXILLARY BUCCAL EXOSTOSES REDUCTIONS (N/A )     Patient location during evaluation: PACU Anesthesia Type: General Level of consciousness: awake and alert Pain management: pain level controlled Vital Signs Assessment: post-procedure vital signs reviewed and stable Respiratory status: spontaneous breathing, nonlabored ventilation, respiratory function stable and patient connected to nasal cannula oxygen Cardiovascular status: blood pressure returned to baseline and stable Postop Assessment: no apparent nausea or vomiting Anesthetic complications: no    Last Vitals:  Vitals:   11/19/16 1124 11/19/16 1155  BP: 130/79 136/86  Pulse:  81  Resp:  16  Temp: 36.8 C 36.7 C  SpO2: 99% 100%    Last Pain:  Vitals:   11/19/16 1155  TempSrc: Axillary  PainSc: 2                  [name]

## 2016-11-19 NOTE — Progress Notes (Signed)
PRE-OPERATIVE NOTE:  11/19/2016 REISS MOWREY [id number]  VITALS: BP 121/86   Pulse 85   Temp 97.7 F (36.5 C) (Oral)   Resp 18   Ht 6\' 2"  (1.88 m)   Wt 156 lb (70.8 kg)   SpO2 100%   BMI 20.03 kg/m   Lab Results  Component Value Date   WBC 6.3 10/22/2016   HGB 11.8 (L) 10/22/2016   HCT 34.4 (L) 10/22/2016   MCV 88 10/22/2016   PLT 176 10/22/2016   BMET    Component Value Date/Time   NA 142 10/22/2016 1006   K 3.4 10/22/2016 1006   CL 102 10/22/2016 1006   CO2 30 10/22/2016 1006   GLUCOSE 117 10/22/2016 1006   BUN 5 (L) 10/22/2016 1006   CREATININE 0.9 10/22/2016 1006   CALCIUM 8.8 10/22/2016 1006   GFRNONAA >60 03/28/2016 1701   GFRAA >60 03/28/2016 1701    Lab Results  Component Value Date   INR 1.09 03/28/2016   No results found for: PTT   Ronks presents for Extraction of remaining teeth with alveoloplasty and pre-prosthetic surgery as needed in the operating room with general anesthesia.    SUBJECTIVE: The patient denies any acute medical or dental changes and agrees to proceed with treatment as planned.  EXAM: No sign of acute dental changes.  ASSESSMENT: Patient is affected by chronic apical periodontitis, retained root segments, chronic periodontitis, dental caries, loose teeth, bilateral mandibular lingual tori and lateral exostoses.  PLAN: Patient agrees to proceed with treatment as planned in the operating room as previously discussed and accepts the risks, benefits, and complications of the proposed treatment. Patient is aware of the risk for bleeding, bruising, swelling, infection, pain, nerve damage, soft tissue damage, sinus involvement, root tip fracture, mandible fracture, and the risks of complications associated with the anesthesia. Patient also is aware of the risk for osteonecrosis of the jaw related to previous Zometa and Xgeva therapies. Patient also is aware of the potential for other complications not mentioned  above.   Lenn Cal, DDS

## 2016-11-19 NOTE — Discharge Instructions (Signed)
MOUTH CARE AFTER SURGERY ° °FACTS: °· Ice used in ice bag helps keep the swelling down, and can help lessen the pain. °· It is easier to treat pain BEFORE it happens. °· Spitting disturbs the clot and may cause bleeding to start again, or to get worse. °· Smoking delays healing and can cause complications. °· Sharing prescriptions can be dangerous.  Do not take medications not recently prescribed for you. °· Antibiotics may stop birth control pills from working.  Use other means of birth control while on antibiotics. °· Warm salt water rinses after the first 24 hours will help lessen the swelling:  Use 1/2 teaspoonful of table salt per oz.of water. ° °DO NOT: °· Do not spit.  Do not drink through a straw. °· Strongly advised not to smoke, dip snuff or chew tobacco at least for 3 days. °· Do not eat sharp or crunchy foods.  Avoid the area of surgery when chewing. °· Do not stop your antibiotics before your instructions say to do so. °· Do not eat hot foods until bleeding has stopped.  If you need to, let your food cool down to room temperature. ° °EXPECT: °· Some swelling, especially first 2-3 days. °· Soreness or discomfort in varying degrees.  Follow your dentist's instructions about how to handle pain before it starts. °· Pinkish saliva or light blood in saliva, or on your pillow in the morning.  This can last around 24 hours. °· Bruising inside or outside the mouth.  This may not show up until 2-3 days after surgery.  Don't worry, it will go away in time. °· Pieces of "bone" may work themselves loose.  It's OK.  If they bother you, let us know. ° °WHAT TO DO IMMEDIATELY AFTER SURGERY: °· Bite on the gauze with steady pressure for 1-2 hours.  Don't chew on the gauze. °· Do not lie down flat.  Raise your head support especially for the first 24 hours. °· Apply ice to your face on the side of the surgery.  You may apply it 20 minutes on and a few minutes off.  Ice for 8-12 hours.  You may use ice up to 24  hours. °· Before the numbness wears off, take a pain pill as instructed. °· Prescription pain medication is not always required. ° °SWELLING: °· Expect swelling for the first couple of days.  It should get better after that. °· If swelling increases 3 days or so after surgery; let us know as soon as possible. ° °FEVER: °· Take Tylenol every 4 hours if needed to lower your temperature, especially if it is at 100F or higher. °· Drink lots of fluids. °· If the fever does not go away, let us know. ° °BREATHING TROUBLE: °· Any unusual difficulty breathing means you have to have someone bring you to the emergency room ASAP ° °BLEEDING: °· Light oozing is expected for 24 hours or so. °· Prop head up with pillows °· Avoid spitting °· Do not confuse bright red fresh flowing blood with lots of saliva colored with a little bit of blood. °· If you notice some bleeding, place gauze or a tea bag where it is bleeding and apply CONSTANT pressure by biting down for 1 hour.  Avoid talking during this time.  Do not remove the gauze or tea bag during this hour to "check" the bleeding. °· If you notice bright RED bleeding FLOWING out of particular area, and filling the floor of your mouth, put   a wad of gauze on that area, bite down firmly and constantly.  Call us immediately.  If we're closed, have someone bring you to the emergency room. ° °ORAL HYGIENE: °· Brush your teeth as usual after meals and before bedtime. °· Use a soft toothbrush around the area of surgery. °· DO NOT AVOID BRUSHING.  Otherwise bacteria(germs) will grow and may delay healing or encourage infection. °· Since you cannot spit, just gently rinse and let the water flow out of your mouth. °· DO NOT SWISH HARD. ° °EATING: °· Cool liquids are a good point to start.  Increase to soft foods as tolerated. ° °PRESCRIPTIONS: °· Follow the directions for your prescriptions exactly as written. °· If Dr. Kulinski gave you a narcotic pain medication, do not drive, operate  machinery or drink alcohol when on that medication. ° °QUESTIONS: °· Call our office during office hours [phone] or call the Emergency Room at [phone]. ° °General Anesthesia, Adult, Care After °These instructions provide you with information about caring for yourself after your procedure. Your health care provider may also give you more specific instructions. Your treatment has been planned according to current medical practices, but problems sometimes occur. Call your health care provider if you have any problems or questions after your procedure. °What can I expect after the procedure? °After the procedure, it is common to have: °· Vomiting. °· A sore throat. °· Mental slowness. ° °It is common to feel: °· Nauseous. °· Cold or shivery. °· Sleepy. °· Tired. °· Sore or achy, even in parts of your body where you did not have surgery. ° °Follow these instructions at home: °For at least 24 hours after the procedure: °· Do not: °? Participate in activities where you could fall or become injured. °? Drive. °? Use heavy machinery. °? Drink alcohol. °? Take sleeping pills or medicines that cause drowsiness. °? Make important decisions or sign legal documents. °? Take care of children on your own. °· Rest. °Eating and drinking °· If you vomit, drink water, juice, or soup when you can drink without vomiting. °· Drink enough fluid to keep your urine clear or pale yellow. °· Make sure you have little or no nausea before eating solid foods. °· Follow the diet recommended by your health care provider. °General instructions °· Have a responsible adult stay with you until you are awake and alert. °· Return to your normal activities as told by your health care provider. Ask your health care provider what activities are safe for you. °· Take over-the-counter and prescription medicines only as told by your health care provider. °· If you smoke, do not smoke without supervision. °· Keep all follow-up visits as told by your  health care provider. This is important. °Contact a health care provider if: °· You continue to have nausea or vomiting at home, and medicines are not helpful. °· You cannot drink fluids or start eating again. °· You cannot urinate after 8-12 hours. °· You develop a skin rash. °· You have fever. °· You have increasing redness at the site of your procedure. °Get help right away if: °· You have difficulty breathing. °· You have chest pain. °· You have unexpected bleeding. °· You feel that you are having a life-threatening or urgent problem. °This information is not intended to replace advice given to you by your health care provider. Make sure you discuss any questions you have with your health care provider. °Document Released: 04/21/2000 Document Revised: 06/18/2015 Document Reviewed: 12/28/2014 °  Elsevier Interactive Patient Education © 2018 [employer] ° ° ° ° °

## 2016-12-01 ENCOUNTER — Other Ambulatory Visit: Payer: Self-pay | Admitting: Hematology & Oncology

## 2016-12-01 ENCOUNTER — Ambulatory Visit (HOSPITAL_COMMUNITY): Payer: Self-pay | Admitting: Dentistry

## 2016-12-01 ENCOUNTER — Telehealth: Payer: Self-pay | Admitting: Oncology

## 2016-12-01 ENCOUNTER — Encounter (HOSPITAL_COMMUNITY): Payer: Self-pay | Admitting: Dentistry

## 2016-12-01 VITALS — BP 102/58 | HR 78 | Temp 97.8°F

## 2016-12-01 DIAGNOSIS — K08199 Complete loss of teeth due to other specified cause, unspecified class: Secondary | ICD-10-CM

## 2016-12-01 DIAGNOSIS — C7951 Secondary malignant neoplasm of bone: Secondary | ICD-10-CM

## 2016-12-01 DIAGNOSIS — K08109 Complete loss of teeth, unspecified cause, unspecified class: Secondary | ICD-10-CM

## 2016-12-01 DIAGNOSIS — K0820 Unspecified atrophy of edentulous alveolar ridge: Secondary | ICD-10-CM

## 2016-12-01 DIAGNOSIS — C61 Malignant neoplasm of prostate: Secondary | ICD-10-CM

## 2016-12-01 NOTE — Telephone Encounter (Signed)
Patient called back and said he needs a letter as to why he needed his teeth pulled.  He said he had discussed this with Dr. Sondra Come and was told it was from radiation.  Advised him that I would discuss it with Dr. Sondra Come tomorrow.  He also asked for a reflll of percocet that he last had filled on 11/19/16.  He said his lower back is still hurting and it is radiating down his legs.

## 2016-12-01 NOTE — Telephone Encounter (Signed)
Patient called and asked for a letter detailing his radiation treatment for his insurance.  Advised him that he can request a copy of his end of treatment summary from the office staff in Radiation Oncology.  He verbalized understanding.

## 2016-12-01 NOTE — Patient Instructions (Signed)
PLAN: 1. Continue chlorhexidine rinses twice daily as prescribed 2. Continue to use salt water rinses every 2 hours while awake in between the chlorhexidine rinses. 3. Brush tongue daily. 4. Advance diet as tolerated with continued use of Ensure Plus for nutritional supplementation. 5. Return to clinic in 1 month for reevaluation of healing. 6. Continue to have Dr. Marin Olp hold the Zometa therapy 7. Planned for follow-up with a dentist of his choice for fabrication of upper lower complete dentures after reevaluation of healing in one month. 8. Continue process of the applying for Medicaid.  Lenn Cal, DDS

## 2016-12-01 NOTE — Progress Notes (Signed)
POST OPERATIVE NOTE:  12/01/2016 THAI BURGUENO [id number]  VITALS: BP (!) 102/58 (BP Location: Left Arm)   Pulse 78   Temp 97.8 F (36.6 C) (Oral)   LABS:  Lab Results  Component Value Date   WBC 6.3 10/22/2016   HGB 10.5 (L) 11/19/2016   HCT 30.8 (L) 11/19/2016   MCV 88 10/22/2016   PLT 176 10/22/2016   BMET    Component Value Date/Time   NA 142 10/22/2016 1006   K 3.4 10/22/2016 1006   CL 102 10/22/2016 1006   CO2 30 10/22/2016 1006   GLUCOSE 117 10/22/2016 1006   BUN 5 (L) 10/22/2016 1006   CREATININE 0.9 10/22/2016 1006   CALCIUM 8.8 10/22/2016 1006   GFRNONAA >60 03/28/2016 1701   GFRAA >60 03/28/2016 1701    Lab Results  Component Value Date   INR 1.09 03/28/2016   No results found for: PTT   Genaro E Stopa is status post extraction of remaining teeth with alveoloplasty and pre-prosthetic surgery as needed in the operating room with general anesthesia on 11/19/2016.  SUBJECTIVE: Patient indicates that extraction sites are still sore. Sutures are still remaining.  EXAM: There is no sign of infection, heme, or ooze. Patient is healing in by generalized primary closure. Sutures are loosely intact. Patient is now edentulous. There is atrophy of the edentulous alveolar ridges.  PROCEDURE: The patient was given a chlorhexidine gluconate rinse for 30 seconds. Sutures were then removed without complication. Patient tolerated the procedure well.  ASSESSMENT: Post operative course is consistent with dental procedures performed in the operating room with general anesthesia. 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 Continue chlorhexidine rinses twice daily as prescribed 2. Continue to use salt water rinses every 2 hours while awake in between the chlorhexidine rinses. 3. Brush tongue daily. 4. Advance diet as tolerated with continued use of Ensure Plus for nutritional supplementation. 5. Return to  clinic in 1 month for reevaluation of healing. 6. Continue to have Dr. Marin Olp hold the Zometa therapy 7. Planned for follow-up with a dentist of his choice for fabrication of upper lower complete dentures after reevaluation of healing in one month. 8. Continue process of the applying for Medicaid.  Lenn Cal, DDS

## 2016-12-02 ENCOUNTER — Other Ambulatory Visit: Payer: Self-pay | Admitting: Radiation Oncology

## 2016-12-02 MED ORDER — OXYCODONE-ACETAMINOPHEN 5-325 MG PO TABS: ORAL_TABLET | ORAL | 0 refills | Status: DC

## 2016-12-02 NOTE — Telephone Encounter (Signed)
Left a message for patient advising him that his pain medication refill is available for pick up in the Radiation nursing area.  Also advised him that he should call Dr. Antonieta Pert office regarding the letter he needs for his loose teeth.  Per Dr. Sondra Come it was not caused by radiation treatment.

## 2016-12-03 ENCOUNTER — Telehealth: Payer: Self-pay | Admitting: Oncology

## 2016-12-03 ENCOUNTER — Other Ambulatory Visit: Payer: Self-pay | Admitting: Oncology

## 2016-12-03 NOTE — Telephone Encounter (Signed)
Patient called and said he received the wrong dose of percocet.  He said he was receiving the 10/325 mg strength before but the prescription he picked up yesterday was for 5/325 mg.  He said the dentist prescribed him 5/325 mg tablets for his surgery and thinks that is why it was reordered.  He said they are not helping his pain and would like a refill of the 10/325 mg tablets.

## 2016-12-04 ENCOUNTER — Other Ambulatory Visit: Payer: Self-pay | Admitting: Radiation Oncology

## 2016-12-04 ENCOUNTER — Telehealth: Payer: Self-pay | Admitting: Oncology

## 2016-12-04 DIAGNOSIS — C61 Malignant neoplasm of prostate: Secondary | ICD-10-CM

## 2016-12-04 DIAGNOSIS — M549 Dorsalgia, unspecified: Secondary | ICD-10-CM

## 2016-12-04 DIAGNOSIS — C7951 Secondary malignant neoplasm of bone: Secondary | ICD-10-CM

## 2016-12-04 MED ORDER — OXYCODONE-ACETAMINOPHEN 10-325 MG PO TABS: 1.0000 | ORAL_TABLET | ORAL | 0 refills | Status: DC | PRN

## 2016-12-05 ENCOUNTER — Encounter: Payer: Self-pay | Admitting: *Deleted

## 2016-12-05 NOTE — Progress Notes (Signed)
1600 Returned call to [employer] regarding oxycodone 10-325 prescription changed to 1 every 4 hours for pain for 30 tablets from 12-04-16 per Dr. Sondra Come read the pharmacist Claudia Pollock the why the order was written from the chart, she said she was just checking the order.

## 2016-12-13 ENCOUNTER — Other Ambulatory Visit: Payer: Self-pay | Admitting: Hematology & Oncology

## 2016-12-13 DIAGNOSIS — C61 Malignant neoplasm of prostate: Secondary | ICD-10-CM

## 2016-12-13 DIAGNOSIS — C7951 Secondary malignant neoplasm of bone: Principal | ICD-10-CM

## 2016-12-15 ENCOUNTER — Other Ambulatory Visit: Payer: Self-pay

## 2016-12-15 ENCOUNTER — Telehealth: Payer: Self-pay | Admitting: Hematology & Oncology

## 2016-12-15 DIAGNOSIS — D508 Other iron deficiency anemias: Secondary | ICD-10-CM

## 2016-12-15 DIAGNOSIS — C61 Malignant neoplasm of prostate: Secondary | ICD-10-CM

## 2016-12-15 MED ORDER — BICALUTAMIDE 50 MG PO TABS: 50.0000 mg | ORAL_TABLET | Freq: Every day | ORAL | 6 refills | Status: DC

## 2016-12-15 NOTE — Telephone Encounter (Signed)
sw pt to confirm lab/md appt 11/20 @ 1015 per sch msg

## 2016-12-16 ENCOUNTER — Other Ambulatory Visit: Payer: Self-pay

## 2016-12-16 ENCOUNTER — Ambulatory Visit (HOSPITAL_BASED_OUTPATIENT_CLINIC_OR_DEPARTMENT_OTHER): Payer: BLUE CROSS/BLUE SHIELD

## 2016-12-16 ENCOUNTER — Other Ambulatory Visit (HOSPITAL_BASED_OUTPATIENT_CLINIC_OR_DEPARTMENT_OTHER): Payer: BLUE CROSS/BLUE SHIELD

## 2016-12-16 ENCOUNTER — Ambulatory Visit (HOSPITAL_BASED_OUTPATIENT_CLINIC_OR_DEPARTMENT_OTHER): Payer: BLUE CROSS/BLUE SHIELD | Admitting: Family

## 2016-12-16 ENCOUNTER — Encounter: Payer: Self-pay | Admitting: Family

## 2016-12-16 VITALS — BP 105/83 | HR 85 | Temp 97.4°F | Resp 17 | Wt 157.4 lb

## 2016-12-16 DIAGNOSIS — C61 Malignant neoplasm of prostate: Secondary | ICD-10-CM

## 2016-12-16 DIAGNOSIS — C7951 Secondary malignant neoplasm of bone: Secondary | ICD-10-CM

## 2016-12-16 DIAGNOSIS — D508 Other iron deficiency anemias: Secondary | ICD-10-CM

## 2016-12-16 DIAGNOSIS — M549 Dorsalgia, unspecified: Secondary | ICD-10-CM | POA: Diagnosis not present

## 2016-12-16 DIAGNOSIS — R52 Pain, unspecified: Secondary | ICD-10-CM

## 2016-12-16 DIAGNOSIS — Z5111 Encounter for antineoplastic chemotherapy: Secondary | ICD-10-CM | POA: Diagnosis not present

## 2016-12-16 DIAGNOSIS — R0602 Shortness of breath: Secondary | ICD-10-CM | POA: Diagnosis not present

## 2016-12-16 DIAGNOSIS — M79606 Pain in leg, unspecified: Secondary | ICD-10-CM | POA: Diagnosis not present

## 2016-12-16 LAB — CBC WITH DIFFERENTIAL (CANCER CENTER ONLY)
BASO#: 0 10*3/uL (ref 0.0–0.2)
BASO%: 0.3 % (ref 0.0–2.0)
EOS ABS: 0.4 10*3/uL (ref 0.0–0.5)
EOS%: 5.8 % (ref 0.0–7.0)
HEMATOCRIT: 36.4 % — AB (ref 38.7–49.9)
HEMOGLOBIN: 12.2 g/dL — AB (ref 13.0–17.1)
LYMPH#: 1.2 10*3/uL (ref 0.9–3.3)
LYMPH%: 17.7 % (ref 14.0–48.0)
MCH: 31 pg (ref 28.0–33.4)
MCHC: 33.5 g/dL (ref 32.0–35.9)
MCV: 92 fL (ref 82–98)
MONO#: 0.7 10*3/uL (ref 0.1–0.9)
MONO%: 9.4 % (ref 0.0–13.0)
NEUT#: 4.6 10*3/uL (ref 1.5–6.5)
NEUT%: 66.8 % (ref 40.0–80.0)
Platelets: 185 10*3/uL (ref 145–400)
RBC: 3.94 10*6/uL — ABNORMAL LOW (ref 4.20–5.70)
RDW: 15.2 % (ref 11.1–15.7)
WBC: 6.9 10*3/uL (ref 4.0–10.0)

## 2016-12-16 LAB — CMP (CANCER CENTER ONLY)
ALT(SGPT): 16 U/L (ref 10–47)
AST: 15 U/L (ref 11–38)
Albumin: 3.7 g/dL (ref 3.3–5.5)
Alkaline Phosphatase: 77 U/L (ref 26–84)
BILIRUBIN TOTAL: 0.6 mg/dL (ref 0.20–1.60)
BUN, Bld: 4 mg/dL — ABNORMAL LOW (ref 7–22)
CALCIUM: 9.4 mg/dL (ref 8.0–10.3)
CO2: 33 meq/L (ref 18–33)
CREATININE: 0.8 mg/dL (ref 0.6–1.2)
Chloride: 101 mEq/L (ref 98–108)
GLUCOSE: 109 mg/dL (ref 73–118)
Potassium: 3.5 mEq/L (ref 3.3–4.7)
SODIUM: 143 meq/L (ref 128–145)
Total Protein: 6.7 g/dL (ref 6.4–8.1)

## 2016-12-16 LAB — IRON AND TIBC
%SAT: 33 % (ref 20–55)
Iron: 77 ug/dL (ref 42–163)
TIBC: 230 ug/dL (ref 202–409)
UIBC: 153 ug/dL (ref 117–376)

## 2016-12-16 LAB — FERRITIN: Ferritin: 237 ng/ml (ref 22–316)

## 2016-12-16 MED ORDER — LEUPROLIDE ACETATE (3 MONTH) 11.25 MG IM KIT
11.2500 mg | PACK | Freq: Once | INTRAMUSCULAR | Status: AC
  Administered 2016-12-16: 11.25 mg via INTRAMUSCULAR
  Filled 2016-12-16: qty 11.25

## 2016-12-16 MED ORDER — OXYCODONE-ACETAMINOPHEN 10-325 MG PO TABS
1.0000 | ORAL_TABLET | Freq: Four times a day (QID) | ORAL | 0 refills | Status: DC | PRN
Start: 2016-12-16 — End: 2017-01-15

## 2016-12-16 MED ORDER — MEGESTROL ACETATE 625 MG/5ML PO SUSP: 625.0000 mg | Freq: Two times a day (BID) | ORAL | 4 refills | Status: DC

## 2016-12-16 MED FILL — OXYCODONE-APAP 10-325 MG TA: 10-325 | 30 days supply | Qty: 120 | Fill #0

## 2016-12-16 MED FILL — MEGESTROL 625 MG/5 ML SUSP: 625 | 15 days supply | Qty: 150 | Fill #0

## 2016-12-16 NOTE — Progress Notes (Signed)
Hematology and Oncology Follow Up Visit  Ryan Davis [id number] [birth year] 56 y.o. 12/16/2016   Principle Diagnosis:  Metastatic prostate cancer - androgen sensitive  Current Therapy:   Lupron 11.5 mg IM every 3 month - next dose to be given 02/2017 Casodex 50 mg by mouth daily Zytiga 1000 mg by mouth daily/prednisone 10 mg by mouth daily - started on 04/20/2016 Zometa 4 mg IV q month - on hold after dental surgery (teeth extraction) on 11/19/16   Interim History:  Ryan Davis is here today for follow-up. We have not seen Ryan Davis in a couple months due to his wife's poor health and his having to care for her.  He has been off the Casodex and Zytiga for over a month. He is due for his Lupron today.  We will continue to hold the Zometa. He had his teeth extracted on 11/19/16 with Dr. Enrique Sack.  He is waiting to get new dentures in the new year.  His appetite has been down without his teeth. He is hydrating well. His weight is stable.  He is having back and leg pain. No swelling in his extremities.  He has occasional numbness and tingling in his hands and feet.  He has SOB and palpitations with over exertion. He has occasional dizziness, no falls or syncope.  No fever, chills, n/v, cough, rash, chest pain, abdominal pain or changes in bowel or bladder habits.  No episodes of bleeding, bruising or petechiae. No lymphadenopathy found on exam.   ECOG Performance Status: 2 - Symptomatic, <50% confined to bed  Medications:  Allergies as of 12/16/2016   No Known Allergies     Medication List        Accurate as of 12/16/16  3:58 PM. Always use your most recent med list.          bicalutamide 50 MG tablet Commonly known as:  CASODEX Take 1 tablet (50 mg total) daily by mouth.   CALCIUM-VITAMIN D3 PO Take 1 tablet by mouth daily.   CARTIA XT 240 MG 24 hr capsule Generic drug:  diltiazem TAKE 1 CAPSULE BY MOUTH EVERY DAY   emollient cream Commonly known as:  BIAFINE Apply 1  application topically as needed (for back).   loperamide 2 MG capsule Commonly known as:  IMODIUM Take 2 mg by mouth as needed for diarrhea or loose stools.   megestrol 625 MG/5ML suspension Commonly known as:  MEGACE ES Take 5 mLs (625 mg total) by mouth 2 (two) times daily.   metoprolol tartrate 25 MG tablet Commonly known as:  LOPRESSOR Take 0.5 tablets (12.5 mg total) by mouth 2 (two) times daily.   oxyCODONE-acetaminophen 10-325 MG tablet Commonly known as:  PERCOCET Take 1 tablet by mouth every 6 (six) hours as needed for pain.   ZYTIGA 250 MG tablet Generic drug:  abiraterone acetate TAKE 4 TABLETS BY MOUTH EVERY DAY ON AN EMPTY STOMACH EITHER 1 HOUR BEFORE OR 2 HOURS AFTER A MEAL       Allergies: No Known Allergies  Past Medical History, Surgical history, Social history, and Family History were reviewed and updated.  Review of Systems: All other 10 point review of systems is negative.   Physical Exam:  weight is 157 lb 6.4 oz (71.4 kg). His oral temperature is 97.4 F (36.3 C) (abnormal). His blood pressure is 105/83 and his pulse is 85. His respiration is 17 and oxygen saturation is 97%.   Wt Readings from Last 3 Encounters:  12/16/16 157  lb 6.4 oz (71.4 kg)  11/19/16 156 lb (70.8 kg)  11/17/16 156 lb 6.4 oz (70.9 kg)    Ocular: Sclerae unicteric, pupils equal, round and reactive to light Ear-nose-throat: Oropharynx clear, dentition fair Lymphatic: No cervical, supraclavicular or axillary adenopathy Lungs no rales or rhonchi, good excursion bilaterally Heart regular rate and rhythm, no murmur appreciated Abd soft, nontender, positive bowel sounds, no liver or spleen tip palpated on exam, no fluid wave  MSK no focal spinal tenderness, no joint edema Neuro: non-focal, well-oriented, appropriate affect Breasts: Deferred   Lab Results  Component Value Date   WBC 6.9 12/16/2016   HGB 12.2 (L) 12/16/2016   HCT 36.4 (L) 12/16/2016   MCV 92 12/16/2016   PLT  185 12/16/2016   Lab Results  Component Value Date   FERRITIN 237 12/16/2016   IRON 77 12/16/2016   TIBC 230 12/16/2016   UIBC 153 12/16/2016   IRONPCTSAT 33 12/16/2016   Lab Results  Component Value Date   RBC 3.94 (L) 12/16/2016   Lab Results  Component Value Date   KAPLAMBRATIO 0.89 03/28/2016   Lab Results  Component Value Date   IGGSERUM 1,066 03/28/2016   IGMSERUM 106 03/28/2016   No results found for: Odetta Pink, SPEI   Chemistry      Component Value Date/Time   NA 143 12/16/2016 1022   K 3.5 12/16/2016 1022   CL 101 12/16/2016 1022   CO2 33 12/16/2016 1022   BUN 4 (L) 12/16/2016 1022   CREATININE 0.8 12/16/2016 1022      Component Value Date/Time   CALCIUM 9.4 12/16/2016 1022   ALKPHOS 77 12/16/2016 1022   AST 15 12/16/2016 1022   ALT 16 12/16/2016 1022   BILITOT 0.60 12/16/2016 1022      Impression and Plan: Ryan Davis is a very pleasant 56 yo African American gentleman with metastatic prostate cancer with widespread skeletal metastasis. He has been off his oral chemo for over a month while caring for his wife. PSA for today is pending.  He is feeling fatigued and having lower back and bilateral leg pain. He has had some SOB with palpitations with over exertion.  I did refill his Percocet today for the back and leg pain. His Megace was also refilled.  Dr. Marin Olp refilled his Casodex and Zytiga yesterday. He should be receiving these in the mail within the next week.  We will proceed with his Lupron injection today as planned. We will continue to hold the Zometa for now.  We will go ahead and plan to see Ryan Davis back in another 4 weeks for follow-up and lab.  He will contact our office with any questions or concerns. We can certainly see her sooner if need be.   Eliezer Bottom, NP 11/20/20183:58 PM

## 2016-12-17 ENCOUNTER — Other Ambulatory Visit: Payer: Self-pay | Admitting: Family

## 2016-12-17 DIAGNOSIS — C7951 Secondary malignant neoplasm of bone: Secondary | ICD-10-CM

## 2016-12-17 DIAGNOSIS — C61 Malignant neoplasm of prostate: Secondary | ICD-10-CM

## 2016-12-17 LAB — PSA: PROSTATE SPECIFIC AG, SERUM: 7.1 ng/mL — AB (ref 0.0–4.0)

## 2016-12-17 LAB — TESTOSTERONE

## 2016-12-22 MED FILL — BICALUTAMIDE 50 MG TABLET: 50 | 30 days supply | Qty: 30 | Fill #0

## 2016-12-23 ENCOUNTER — Telehealth: Payer: Self-pay | Admitting: Hematology & Oncology

## 2016-12-23 NOTE — Telephone Encounter (Signed)
Faxed medical record to: [facility] F: [phone]    Note: 3rd attempt     COPY SCANNED

## 2016-12-29 ENCOUNTER — Other Ambulatory Visit: Payer: Self-pay | Admitting: Hematology & Oncology

## 2017-01-01 ENCOUNTER — Telehealth: Payer: Self-pay | Admitting: Hematology & Oncology

## 2017-01-01 NOTE — Telephone Encounter (Signed)
sw patient to confirm PET scan at [facility] 01/16/17 at 2 Ppm. Npo 4-6 hrs prior and no exercise 24 hrs prior. Pt voiced understanding

## 2017-01-05 ENCOUNTER — Ambulatory Visit (HOSPITAL_COMMUNITY): Payer: Self-pay | Admitting: Dentistry

## 2017-01-06 ENCOUNTER — Encounter (HOSPITAL_COMMUNITY): Payer: Self-pay | Admitting: Dentistry

## 2017-01-06 ENCOUNTER — Encounter: Payer: Self-pay | Admitting: Hematology & Oncology

## 2017-01-06 ENCOUNTER — Ambulatory Visit (HOSPITAL_COMMUNITY): Payer: Self-pay | Admitting: Dentistry

## 2017-01-06 VITALS — BP 123/76 | HR 74 | Temp 97.8°F

## 2017-01-06 DIAGNOSIS — C61 Malignant neoplasm of prostate: Secondary | ICD-10-CM

## 2017-01-06 DIAGNOSIS — C7951 Secondary malignant neoplasm of bone: Principal | ICD-10-CM

## 2017-01-06 DIAGNOSIS — K08109 Complete loss of teeth, unspecified cause, unspecified class: Secondary | ICD-10-CM

## 2017-01-06 NOTE — Progress Notes (Signed)
Progress note:  01/06/2017 HULBERT BRANSCOME [id number]  VITALS: BP 123/76 (BP Location: Left Arm)   Pulse 74   Temp 97.8 F (36.6 C) (Oral)   LABS:  Lab Results  Component Value Date   WBC 6.9 12/16/2016   HGB 12.2 (L) 12/16/2016   HCT 36.4 (L) 12/16/2016   MCV 92 12/16/2016   PLT 185 12/16/2016   BMET    Component Value Date/Time   NA 143 12/16/2016 1022   K 3.5 12/16/2016 1022   CL 101 12/16/2016 1022   CO2 33 12/16/2016 1022   GLUCOSE 109 12/16/2016 1022   BUN 4 (L) 12/16/2016 1022   CREATININE 0.8 12/16/2016 1022   CALCIUM 9.4 12/16/2016 1022   GFRNONAA >60 03/28/2016 1701   GFRAA >60 03/28/2016 1701    Lab Results  Component Value Date   INR 1.09 03/28/2016   No results found for: PTT   Reagan E Guterrez is status post extraction of remaining teeth with alveoloplasty and pre-prosthetic surgery as needed in the operating room with general anesthesia on 11/19/2016. Patient now presents for reevaluation of healing.  SUBJECTIVE: Patient denies having any problems with healing from his dental extraction sites. Patient is interested in proceeding with upper lower complete denture fabrication.  EXAM: Patient is completely edentulous. Healing has progressed well. There is atrophy of the edentulous alveolar ridges. There is no sign of exposed bone.  ASSESSMENT: Post operative course is consistent with dental procedures performed in the operating room with general anesthesia. 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 Patient to continue salt water rinses as needed to aid healing. 2. Patient follow-up with social services to determine if he is eligible for Medicaid dental coverage. If so, patient may follow-up with a dentist that takes Medicaid for upper and lower complete denture fabrication. 3.  Patient to consider following up with [facility] or [facility] for upper and lower complete denture fabrication that is less  expensive. 4.  Patient is cleared for restart of Zometa therapy at this time with Dr. Marin Olp. 5.  Patient to call if problems with healing arise.   Lenn Cal, DDS

## 2017-01-06 NOTE — Patient Instructions (Signed)
1. Patient to continue salt water rinses as needed to aid healing. 2. Patient follow-up with social services to determine if he is eligible for Medicaid dental coverage. If so, patient may follow-up with a dentist that takes Medicaid for upper and lower complete denture fabrication. 3.  Patient to consider following up with [facility] or [facility] for upper and lower complete denture fabrication that is less expensive. 4.  Patient is cleared for restart of Zometa therapy at this time with Dr. Marin Olp. 5.  Patient to call if problems with healing arise.  Dr. Enrique Sack

## 2017-01-06 NOTE — Progress Notes (Signed)
Patient came in inquiring about assistance with dentures. He states he applied for disability and Medicaid but hasn't heard anything.  Gave patient number to disability to follow up and number to [facility]. Called social worker(Abigail) to see if she knew of any resources and she states she didn't but if she came across any she would reach out to patient.

## 2017-01-12 ENCOUNTER — Telehealth: Payer: Self-pay | Admitting: Oncology

## 2017-01-12 NOTE — Telephone Encounter (Signed)
Patient called and said he is waiting for his medical records to be sent from [facility] for his disability case.  Advised him that Medical Records at [facility] can get him a copy of all his records.  He verbalized understanding and agreement.

## 2017-01-14 ENCOUNTER — Other Ambulatory Visit: Payer: BLUE CROSS/BLUE SHIELD

## 2017-01-14 ENCOUNTER — Ambulatory Visit: Payer: BLUE CROSS/BLUE SHIELD | Admitting: Family

## 2017-01-14 ENCOUNTER — Ambulatory Visit: Payer: BLUE CROSS/BLUE SHIELD

## 2017-01-15 ENCOUNTER — Other Ambulatory Visit: Payer: Self-pay | Admitting: *Deleted

## 2017-01-15 DIAGNOSIS — R52 Pain, unspecified: Secondary | ICD-10-CM

## 2017-01-15 DIAGNOSIS — C61 Malignant neoplasm of prostate: Secondary | ICD-10-CM

## 2017-01-15 MED ORDER — OXYCODONE-ACETAMINOPHEN 10-325 MG PO TABS: 1.0000 | ORAL_TABLET | Freq: Four times a day (QID) | ORAL | 0 refills | Status: DC | PRN

## 2017-01-15 MED FILL — OXYCODONE-APAP 10-325 MG TA: 10-325 | 30 days supply | Qty: 120 | Fill #0

## 2017-01-15 MED FILL — BICALUTAMIDE 50 MG TABLET: 50 | 30 days supply | Qty: 30 | Fill #1

## 2017-01-16 ENCOUNTER — Encounter (HOSPITAL_COMMUNITY): Admission: RE | Admit: 2017-01-16 | Payer: BLUE CROSS/BLUE SHIELD | Source: Ambulatory Visit

## 2017-01-18 ENCOUNTER — Encounter (HOSPITAL_BASED_OUTPATIENT_CLINIC_OR_DEPARTMENT_OTHER): Payer: Self-pay | Admitting: Emergency Medicine

## 2017-01-18 ENCOUNTER — Emergency Department (HOSPITAL_BASED_OUTPATIENT_CLINIC_OR_DEPARTMENT_OTHER)
Admission: EM | Admit: 2017-01-18 | Discharge: 2017-01-18 | Disposition: A | Discharge status: Home / Self Care | Payer: BLUE CROSS/BLUE SHIELD | Attending: Emergency Medicine | Admitting: Emergency Medicine

## 2017-01-18 ENCOUNTER — Other Ambulatory Visit: Payer: Self-pay

## 2017-01-18 DIAGNOSIS — Z79899 Other long term (current) drug therapy: Secondary | ICD-10-CM | POA: Insufficient documentation

## 2017-01-18 DIAGNOSIS — E876 Hypokalemia: Secondary | ICD-10-CM | POA: Diagnosis not present

## 2017-01-18 DIAGNOSIS — M549 Dorsalgia, unspecified: Secondary | ICD-10-CM | POA: Diagnosis present

## 2017-01-18 DIAGNOSIS — C7951 Secondary malignant neoplasm of bone: Secondary | ICD-10-CM | POA: Diagnosis not present

## 2017-01-18 DIAGNOSIS — C61 Malignant neoplasm of prostate: Secondary | ICD-10-CM | POA: Insufficient documentation

## 2017-01-18 DIAGNOSIS — F17210 Nicotine dependence, cigarettes, uncomplicated: Secondary | ICD-10-CM | POA: Insufficient documentation

## 2017-01-18 DIAGNOSIS — R112 Nausea with vomiting, unspecified: Secondary | ICD-10-CM | POA: Insufficient documentation

## 2017-01-18 LAB — COMPREHENSIVE METABOLIC PANEL
ALT: 11 U/L — AB (ref 17–63)
AST: 21 U/L (ref 15–41)
Albumin: 4.2 g/dL (ref 3.5–5.0)
Alkaline Phosphatase: 89 U/L (ref 38–126)
Anion gap: 12 (ref 5–15)
BUN: 5 mg/dL — ABNORMAL LOW (ref 6–20)
CALCIUM: 9.4 mg/dL (ref 8.9–10.3)
CHLORIDE: 110 mmol/L (ref 101–111)
CO2: 20 mmol/L — ABNORMAL LOW (ref 22–32)
CREATININE: 0.7 mg/dL (ref 0.61–1.24)
GFR calc non Af Amer: >60 mL/min (ref >60)
Glucose, Bld: 129 mg/dL — ABNORMAL HIGH (ref 65–99)
Potassium: 2.5 mmol/L — CL (ref 3.5–5.1)
Sodium: 142 mmol/L (ref 135–145)
Total Bilirubin: 0.5 mg/dL (ref 0.3–1.2)
Total Protein: 7.2 g/dL (ref 6.5–8.1)

## 2017-01-18 LAB — CBC WITH DIFFERENTIAL/PLATELET
Basophils Absolute: 0 10*3/uL (ref 0.0–0.1)
Basophils Relative: 0 %
EOS PCT: 1 %
Eosinophils Absolute: 0 10*3/uL (ref 0.0–0.7)
HEMATOCRIT: 40 % (ref 39.0–52.0)
Hemoglobin: 13.8 g/dL (ref 13.0–17.0)
LYMPHS ABS: 1.3 10*3/uL (ref 0.7–4.0)
LYMPHS PCT: 15 %
MCH: 30.1 pg (ref 26.0–34.0)
MCHC: 34.5 g/dL (ref 30.0–36.0)
MCV: 87.1 fL (ref 78.0–100.0)
MONO ABS: 0.7 10*3/uL (ref 0.1–1.0)
Monocytes Relative: 8 %
Neutro Abs: 6.3 10*3/uL (ref 1.7–7.7)
Neutrophils Relative %: 76 %
PLATELETS: 210 10*3/uL (ref 150–400)
RBC: 4.59 MIL/uL (ref 4.22–5.81)
RDW: 13 % (ref 11.5–15.5)
WBC: 8.3 10*3/uL (ref 4.0–10.5)

## 2017-01-18 LAB — URINALYSIS, ROUTINE W REFLEX MICROSCOPIC
BILIRUBIN URINE: NEGATIVE
GLUCOSE, UA: NEGATIVE mg/dL
HGB URINE DIPSTICK: NEGATIVE
KETONES UR: 15 mg/dL — AB
Nitrite: NEGATIVE
PROTEIN: NEGATIVE mg/dL
Specific Gravity, Urine: 1.02 (ref 1.005–1.030)
pH: 7.5 (ref 5.0–8.0)

## 2017-01-18 LAB — URINALYSIS, MICROSCOPIC (REFLEX): RBC / HPF: NONE SEEN RBC/hpf (ref 0–5)

## 2017-01-18 LAB — MAGNESIUM: MAGNESIUM: 1.7 mg/dL (ref 1.7–2.4)

## 2017-01-18 LAB — LIPASE, BLOOD: Lipase: 18 U/L (ref 11–51)

## 2017-01-18 MED ORDER — ONDANSETRON 4 MG PO TBDP
4.0000 mg | ORAL_TABLET | Freq: Three times a day (TID) | ORAL | 0 refills | Status: DC | PRN
Start: 2017-01-18 — End: 2017-04-30

## 2017-01-18 MED ORDER — SODIUM CHLORIDE 0.9 % IV BOLUS (SEPSIS)
1000.0000 mL | Freq: Once | INTRAVENOUS | Status: AC
  Administered 2017-01-18: 1000 mL via INTRAVENOUS

## 2017-01-18 MED ORDER — HYDROMORPHONE HCL 1 MG/ML IJ SOLN
1.0000 mg | Freq: Once | INTRAMUSCULAR | Status: AC
  Administered 2017-01-18: 1 mg via INTRAVENOUS
  Filled 2017-01-18: qty 1

## 2017-01-18 MED ORDER — POTASSIUM CHLORIDE CRYS ER 20 MEQ PO TBCR
40.0000 meq | EXTENDED_RELEASE_TABLET | Freq: Once | ORAL | Status: AC
  Administered 2017-01-18: 40 meq via ORAL
  Filled 2017-01-18: qty 2

## 2017-01-18 MED ORDER — FENTANYL 50 MCG/HR TD PT72
50.0000 ug | MEDICATED_PATCH | TRANSDERMAL | 0 refills | Status: DC
Start: 2017-01-18 — End: 2017-02-04

## 2017-01-18 MED ORDER — ONDANSETRON HCL 4 MG/2ML IJ SOLN
4.0000 mg | Freq: Once | INTRAMUSCULAR | Status: AC
  Administered 2017-01-18: 4 mg via INTRAVENOUS
  Filled 2017-01-18: qty 2

## 2017-01-18 MED ORDER — FENTANYL 50 MCG/HR TD PT72
50.0000 ug | MEDICATED_PATCH | TRANSDERMAL | Status: DC
  Filled 2017-01-18: qty 1

## 2017-01-18 NOTE — ED Triage Notes (Signed)
Patient reports that he has had to pull his wife up for the last 2 - 3 days and he is having pain to his lower back - feels better on his side

## 2017-01-18 NOTE — ED Notes (Signed)
Pt aware of the need for a urine sample. 

## 2017-01-18 NOTE — ED Notes (Signed)
Pt put on cardiac monitor.

## 2017-01-18 NOTE — ED Notes (Signed)
ED Provider at bedside. 

## 2017-01-18 NOTE — ED Provider Notes (Signed)
[facility] EMERGENCY DEPARTMENT Provider Note   CSN: [id number] Arrival date & time: 01/18/17  1109     History   Chief Complaint Chief Complaint  Patient presents with  . Back Pain    HPI Ryan Davis is a 56 y.o. male.  Pt presents to the ED today with severe back pain, n/v, and abdominal pain.  Pt has a hx of prostate cancer with widespread skeletal mets.  Pt is followed by Dr. Marin Olp.  The pt was supposed to get a PET scan on 12/21, but said he was called to reschedule it for 1/8.  The pt's wife has advanced MS.  She is wheelchair bound and he has to take care of her which includes lifting and all her needs.  He said she's fallen a few times lately, and he's had to pick her off the floor.  The pt said he has pain meds, but they have not been helping.  He said he also has n/v and abd pain.  He denies any fever.      Past Medical History:  Diagnosis Date  . A-fib [facility])    see 03-28-16 admission  . Back pain without radiculopathy 03/28/2016  . Dyspnea    endorses since treatment radiation , still gets SOB occasionally   . Dysrhythmia    afib   . History of radiation therapy 09/16/16-10/06/16   35 Gy in 14 fractions to the lumbar spine  . Nocturia   . Prostate cancer metastatic to bone ([place]) 03/31/2016  . Prostate cancer metastatic to multiple sites [facility]) 03/31/2016    Patient Active Problem List   Diagnosis Date Noted  . 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[place]) 03/31/2016  . Prostate cancer metastatic to bone ([place]) 03/31/2016  . Atrial fibrillation ([place]) 03/30/2016  . Back pain without radiculopathy 03/28/2016  . Atrial fibrillation with RVR ([place]) 03/28/2016    Past Surgical History:  Procedure Laterality Date  . MULTIPLE EXTRACTIONS WITH ALVEOLOPLASTY N/A 11/19/2016   Procedure: EXTRACTION OF TOOTH #'S 1,3,5-7,9-17, AND 20- 29 WITH ALVEOLOPLASTY, BILATERAL MANDIBULAR TORI REDUCTIONS AND BILATERAL MAXILLARY BUCCAL EXOSTOSES REDUCTIONS;  Surgeon:  Lenn Cal, DDS;  Location: [facility] ORS;  Service: Oral Surgery;  Laterality: N/A;  . NO PAST SURGERIES         Home Medications    Prior to Admission medications   Medication Sig Start Date End Date Taking? Authorizing Provider  bicalutamide (CASODEX) 50 MG tablet Take 1 tablet (50 mg total) daily by mouth. 12/15/16   [place], Rudell Cobb, MD  Calcium Carbonate-Vitamin D (CALCIUM-VITAMIN D3 PO) Take 1 tablet by mouth daily.    [provider]  CARTIA XT 240 MG 24 hr capsule TAKE 1 CAPSULE BY MOUTH EVERY DAY 12/01/16   [name], MD  CARTIA XT 240 MG 24 hr capsule TAKE 1 CAPSULE BY MOUTH EVERY DAY 12/29/16   [name], MD  emollient (BIAFINE) cream Apply 1 application topically as needed (for back).    [provider]  loperamide (IMODIUM) 2 MG capsule Take 2 mg by mouth as needed for diarrhea or loose stools.    [provider]  megestrol (MEGACE ES) 625 MG/5ML suspension Take 5 mLs (625 mg total) by mouth 2 (two) times daily. 12/16/16   [place], Holli Humbles, NP  metoprolol tartrate (LOPRESSOR) 25 MG tablet Take 0.5 tablets (12.5 mg total) by mouth 2 (two) times daily. 07/28/16   [name], MD  ondansetron (ZOFRAN ODT) 4  MG disintegrating tablet Take 1 tablet (4 mg total) by mouth every 8 (eight) hours as needed. 01/18/17   Isla Pence, MD  oxyCODONE-acetaminophen (PERCOCET) 10-325 MG tablet Take 1 tablet by mouth every 6 (six) hours as needed for pain. 01/15/17   [name], MD  ZYTIGA 250 MG tablet TAKE 4 TABLETS BY MOUTH EVERY DAY ON AN EMPTY STOMACH EITHER 1 HOUR BEFORE OR 2 HOURS AFTER A MEAL 12/15/16   [name], MD    Family History Family History  Problem Relation Age of Onset  . Stroke Sister   . Cancer Neg Hx   . Heart disease Neg Hx   . Diabetes Neg Hx     Social History Social History   Tobacco Use  . Smoking status: Current Some Day Smoker    Packs/day: 0.25    Years: 39.00    Pack years: 9.75    Types:  Cigarettes    Last attempt to quit: 10/13/2016    Years since quitting: 0.2  . Smokeless tobacco: Never Used  . Tobacco comment: 04/18/2016 still smokes; 11-17-16 states some day smoker   Substance Use Topics  . Alcohol use: No    Comment: has not drank in 7 months.  He used to drink 40oz  of beer daily  . Drug use: No     Allergies   Patient has no known allergies.   Review of Systems Review of Systems  Gastrointestinal: Positive for abdominal pain, nausea and vomiting.  Musculoskeletal: Positive for back pain.  All other systems reviewed and are negative.    Physical Exam Updated Vital Signs BP (!) 147/96 (BP Location: Right Arm)   Pulse 65   Temp 97.7 F (36.5 C) (Oral)   Resp 18   Ht 6\' 2"  (1.88 m)   Wt 71.7 kg (158 lb)   SpO2 99%   BMI 20.29 kg/m   Physical Exam  Constitutional: He is oriented to person, place, and time. He appears well-developed and well-nourished.  HENT:  Head: Normocephalic and atraumatic.  Right Ear: External ear normal.  Left Ear: External ear normal.  Nose: Nose normal.  Mouth/Throat: Oropharynx is clear and moist.  Eyes: Conjunctivae and EOM are normal. Pupils are equal, round, and reactive to light.  Neck: Normal range of motion. Neck supple.  Cardiovascular: Normal rate, regular rhythm, normal heart sounds and intact distal pulses.  Pulmonary/Chest: Effort normal and breath sounds normal.  Abdominal: Soft. Bowel sounds are normal. There is generalized tenderness.  Musculoskeletal: Normal range of motion.  Neurological: He is alert and oriented to person, place, and time.  Skin: Skin is warm and dry. Capillary refill takes less than 2 seconds.  Psychiatric: He has a normal mood and affect. His behavior is normal. Judgment and thought content normal.  Nursing note and vitals reviewed.    ED Treatments / Results  Labs (all labs ordered are listed, but only abnormal results are displayed) Labs Reviewed  COMPREHENSIVE METABOLIC  PANEL - Abnormal; Notable for the following components:      Result Value   Potassium 2.5 (*)    CO2 20 (*)    Glucose, Bld 129 (*)    BUN 5 (*)    ALT 11 (*)    All other components within normal limits  URINALYSIS, ROUTINE W REFLEX MICROSCOPIC - Abnormal; Notable for the following components:   Ketones, ur 15 (*)    Leukocytes, UA TRACE (*)    All other components within normal limits  URINALYSIS, MICROSCOPIC (REFLEX) - Abnormal; Notable for the following components:   Bacteria, UA MANY (*)    Squamous Epithelial / LPF 0-5 (*)    All other components within normal limits  CBC WITH DIFFERENTIAL/PLATELET  LIPASE, BLOOD  MAGNESIUM    EKG  EKG Interpretation None       Radiology No results found.  Procedures Procedures (including critical care time)  Medications Ordered in ED Medications  fentaNYL (DURAGESIC - dosed mcg/hr) 50 mcg (not administered)  HYDROmorphone (DILAUDID) injection 1 mg (1 mg Intravenous Given 01/18/17 1153)  ondansetron (ZOFRAN) injection 4 mg (4 mg Intravenous Given 01/18/17 1153)  sodium chloride 0.9 % bolus 1,000 mL (0 mLs Intravenous Stopped 01/18/17 1243)  sodium chloride 0.9 % bolus 1,000 mL (1,000 mLs Intravenous New Bag/Given 01/18/17 1356)  potassium chloride SA (K-DUR,KLOR-CON) CR tablet 40 mEq (40 mEq Oral Given 01/18/17 1354)  sodium chloride 0.9 % bolus 1,000 mL (0 mLs Intravenous Stopped 01/18/17 1450)  HYDROmorphone (DILAUDID) injection 1 mg (1 mg Intravenous Given 01/18/17 1408)     Initial Impression / Assessment and Plan / ED Course  I have reviewed the triage vital signs and the nursing notes.  Pertinent labs & imaging results that were available during my care of the patient were reviewed by me and considered in my medical decision making (see chart for details).    Pt is feeling much better.  He is tolerating po fluids.  He was given oral potassium replacement while here.  He will be given a duragesic patch prior to d/c to  help with pain.  He is encouraged to f/u with Dr. Marin Olp and return if worse.  Final Clinical Impressions(s) / ED Diagnoses   Final diagnoses:  Prostate cancer metastatic to bone ([place])  Non-intractable vomiting with nausea, unspecified vomiting type  Hypokalemia    ED Discharge Orders        Ordered    ondansetron (ZOFRAN ODT) 4 MG disintegrating tablet  Every 8 hours PRN     01/18/17 1522       Isla Pence, MD 01/18/17 1523

## 2017-01-18 NOTE — ED Notes (Addendum)
Liliane Channel (brother) [phone], [place] Radiation protection practitioner) [phone]

## 2017-01-19 ENCOUNTER — Ambulatory Visit: Payer: Self-pay | Admitting: Family

## 2017-01-19 ENCOUNTER — Ambulatory Visit: Payer: Self-pay

## 2017-01-19 ENCOUNTER — Other Ambulatory Visit: Payer: Self-pay

## 2017-02-04 ENCOUNTER — Ambulatory Visit: Payer: Self-pay

## 2017-02-04 ENCOUNTER — Encounter: Payer: Self-pay | Admitting: Family

## 2017-02-04 ENCOUNTER — Inpatient Hospital Stay (HOSPITAL_BASED_OUTPATIENT_CLINIC_OR_DEPARTMENT_OTHER): Payer: BLUE CROSS/BLUE SHIELD | Admitting: Family

## 2017-02-04 ENCOUNTER — Other Ambulatory Visit: Payer: Self-pay | Admitting: *Deleted

## 2017-02-04 ENCOUNTER — Other Ambulatory Visit: Payer: Self-pay

## 2017-02-04 ENCOUNTER — Inpatient Hospital Stay: Payer: BLUE CROSS/BLUE SHIELD | Attending: Hematology & Oncology

## 2017-02-04 VITALS — BP 120/77 | HR 69 | Temp 97.6°F | Resp 20 | Wt 152.0 lb

## 2017-02-04 DIAGNOSIS — R52 Pain, unspecified: Secondary | ICD-10-CM

## 2017-02-04 DIAGNOSIS — C799 Secondary malignant neoplasm of unspecified site: Secondary | ICD-10-CM

## 2017-02-04 DIAGNOSIS — C61 Malignant neoplasm of prostate: Secondary | ICD-10-CM

## 2017-02-04 DIAGNOSIS — Z79899 Other long term (current) drug therapy: Secondary | ICD-10-CM | POA: Diagnosis not present

## 2017-02-04 DIAGNOSIS — D508 Other iron deficiency anemias: Secondary | ICD-10-CM

## 2017-02-04 LAB — CBC WITH DIFFERENTIAL (CANCER CENTER ONLY)
ABS GRANULOCYTE: 4.1 10*3/uL (ref 1.5–6.5)
Basophils Absolute: 0 10*3/uL (ref 0.0–0.1)
Basophils Relative: 1 %
EOS ABS: 0.2 10*3/uL (ref 0.0–0.5)
Eosinophils Relative: 4 %
HEMATOCRIT: 37.7 % — AB (ref 38.7–49.9)
HEMOGLOBIN: 13 g/dL (ref 13.0–17.1)
Lymphocytes Relative: 21 %
Lymphs Abs: 1.3 10*3/uL (ref 0.9–3.3)
MCH: 30.8 pg (ref 28.0–33.4)
MCHC: 34.5 g/dL (ref 32.0–35.9)
MCV: 89.3 fL (ref 82.0–98.0)
Monocytes Absolute: 0.8 10*3/uL (ref 0.1–0.9)
Monocytes Relative: 13 %
NEUTROS ABS: 4.1 10*3/uL (ref 1.5–6.5)
Neutrophils Relative %: 63 %
Platelet Count: 177 10*3/uL (ref 140–400)
RBC: 4.22 MIL/uL (ref 4.20–5.70)
RDW: 12.9 % (ref 11.1–15.7)
WBC: 6.5 10*3/uL (ref 4.0–10.3)

## 2017-02-04 LAB — CMP (CANCER CENTER ONLY)
ALBUMIN: 3.9 g/dL (ref 3.5–5.0)
ALK PHOS: 104 U/L (ref 40–150)
ALT: 16 U/L (ref 0–55)
ANION GAP: 10 (ref 5–15)
AST: 17 U/L (ref 5–34)
BILIRUBIN TOTAL: 0.7 mg/dL (ref 0.2–1.2)
BUN: 4 mg/dL — AB (ref 7–26)
CO2: 28 mmol/L (ref 22–29)
Calcium: 9.4 mg/dL (ref 8.4–10.4)
Chloride: 106 mmol/L (ref 98–109)
Creatinine: 0.8 mg/dL (ref 0.70–1.30)
GFR, Est AFR Am: >60 mL/min (ref >60)
GFR, Estimated: >60 mL/min (ref >60)
Glucose, Bld: 114 mg/dL (ref 70–140)
POTASSIUM: 3.3 mmol/L — AB (ref 3.5–5.1)
SODIUM: 144 mmol/L (ref 136–145)
TOTAL PROTEIN: 6.9 g/dL (ref 6.4–8.3)

## 2017-02-04 LAB — IRON AND TIBC
Iron: 82 ug/dL (ref 42–163)
SATURATION RATIOS: 37 % — AB (ref 42–163)
TIBC: 223 ug/dL (ref 202–409)
UIBC: 140 ug/dL

## 2017-02-04 LAB — FERRITIN: Ferritin: 216 ng/mL (ref 22–316)

## 2017-02-04 MED ORDER — DILTIAZEM HCL ER COATED BEADS 240 MG PO CP24: ORAL_CAPSULE | ORAL | 6 refills | Status: DC

## 2017-02-04 MED ORDER — BICALUTAMIDE 50 MG PO TABS: 50.0000 mg | ORAL_TABLET | Freq: Every day | ORAL | 6 refills | Status: DC

## 2017-02-04 MED ORDER — FENTANYL 50 MCG/HR TD PT72: 50.0000 ug | MEDICATED_PATCH | TRANSDERMAL | 0 refills | Status: DC

## 2017-02-04 MED FILL — fentaNYL 50 MCG/HR PT72: 50 | 30 days supply | Qty: 10 | Fill #0

## 2017-02-04 NOTE — Progress Notes (Signed)
Hematology and Oncology Follow Up Visit  CAYLAN SCHIFANO [id number] 1960/05/16 57 y.o. 02/04/2017   Principle Diagnosis:  Metastatic prostate cancer - androgen sensitive  Current Therapy:   Lupron 11.5 mg IM every 3 month - next dose to be given 02/2017 Casodex 50 mg by mouth daily Zytiga 1000 mg by mouth daily/prednisone 10 mg by mouth daily - started on 04/20/2016 Zometa 4 mg IV q month - on hold after dental surgery (teeth extraction) on 11/19/16   Interim History:  Mr. Holliman is here today for follow-up. He is still feeling fatigued and weak at times. 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  He has to do quite a bit of lifting with her and will have lower back and leg pain afterwards. He states that the Percocet and Fentanyl patch are effective in managing his pain.  He states that he received the Zytiga and has been taking as prescribed but never received his Casodex. We were not made aware of this until today. I was able to speak with a representative at [facility] and they will look for this prescription to fill today.  His teeth were extracted in October so we will resume the Zometa in February.  No fever, chills, n/v, cough, rash, SOB, chest pain, palpitations, abdominal pain or changes in bowel or bladder habits.  He has dizziness occasionally if he stands too quickly. He is careful about this and has had no falls or syncopal episodes.  No swelling, numbness or tingling in his extremities at this time. His appetite comes and goes. He is doing his best to hydrate well at home. He only likes to drink lemonade and tea.    ECOG Performance Status: 1 - Symptomatic but completely ambulatory  Medications:  Allergies as of 02/04/2017   No Known Allergies     Medication List        Accurate as of 02/04/17 11:39 AM. Always use your most recent med list.          bicalutamide 50 MG tablet Commonly known as:  CASODEX Take 1 tablet (50 mg total) daily  by mouth.   CALCIUM-VITAMIN D3 PO Take 1 tablet by mouth daily.   CARTIA XT 240 MG 24 hr capsule Generic drug:  diltiazem TAKE 1 CAPSULE BY MOUTH EVERY DAY   CARTIA XT 240 MG 24 hr capsule Generic drug:  diltiazem TAKE 1 CAPSULE BY MOUTH EVERY DAY   emollient cream Commonly known as:  BIAFINE Apply 1 application topically as needed (for back).   fentaNYL 50 MCG/HR Commonly known as:  DURAGESIC Place 1 patch (50 mcg total) onto the skin every 3 (three) days.   loperamide 2 MG capsule Commonly known as:  IMODIUM Take 2 mg by mouth as needed for diarrhea or loose stools.   megestrol 625 MG/5ML suspension Commonly known as:  MEGACE ES Take 5 mLs (625 mg total) by mouth 2 (two) times daily.   metoprolol tartrate 25 MG tablet Commonly known as:  LOPRESSOR Take 0.5 tablets (12.5 mg total) by mouth 2 (two) times daily.   ondansetron 4 MG disintegrating tablet Commonly known as:  ZOFRAN ODT Take 1 tablet (4 mg total) by mouth every 8 (eight) hours as needed.   oxyCODONE-acetaminophen 10-325 MG tablet Commonly known as:  PERCOCET Take 1 tablet by mouth every 6 (six) hours as needed for pain.   ZYTIGA 250 MG tablet Generic drug:  abiraterone acetate TAKE 4 TABLETS BY MOUTH  EVERY DAY ON AN EMPTY STOMACH EITHER 1 HOUR BEFORE OR 2 HOURS AFTER A MEAL       Allergies: No Known Allergies  Past Medical History, Surgical history, Social history, and Family History were reviewed and updated.  Review of Systems: All other 10 point review of systems is negative.   Physical Exam:  vitals were not taken for this visit.   Wt Readings from Last 3 Encounters:  01/18/17 158 lb (71.7 kg)  12/16/16 157 lb 6.4 oz (71.4 kg)  11/19/16 156 lb (70.8 kg)    Ocular: Sclerae unicteric, pupils equal, round and reactive to light Ear-nose-throat: Oropharynx clear, dentition fair Lymphatic: No cervical, supraclavicular or axillary adenopathy Lungs no rales or rhonchi, good excursion  bilaterally Heart regular rate and rhythm, no murmur appreciated Abd soft, nontender, positive bowel sounds, no liver or spleen tip palpated on exam, no fluid wave  MSK no focal spinal tenderness, no joint edema Neuro: non-focal, well-oriented, appropriate affect Breasts: Deferred   Lab Results  Component Value Date   WBC 8.3 01/18/2017   HGB 13.8 01/18/2017   HCT 40.0 01/18/2017   MCV 87.1 01/18/2017   PLT 210 01/18/2017   Lab Results  Component Value Date   FERRITIN 237 12/16/2016   IRON 77 12/16/2016   TIBC 230 12/16/2016   UIBC 153 12/16/2016   IRONPCTSAT 33 12/16/2016   Lab Results  Component Value Date   RBC 4.59 01/18/2017   Lab Results  Component Value Date   KAPLAMBRATIO 0.89 03/28/2016   Lab Results  Component Value Date   IGGSERUM 1,066 03/28/2016   IGMSERUM 106 03/28/2016   No results found for: Odetta Pink, SPEI   Chemistry      Component Value Date/Time   NA 142 01/18/2017 1145   NA 143 12/16/2016 1022   K 2.5 (LL) 01/18/2017 1145   K 3.5 12/16/2016 1022   CL 110 01/18/2017 1145   CL 101 12/16/2016 1022   CO2 20 (L) 01/18/2017 1145   CO2 33 12/16/2016 1022   BUN 5 (L) 01/18/2017 1145   BUN 4 (L) 12/16/2016 1022   CREATININE 0.70 01/18/2017 1145   CREATININE 0.8 12/16/2016 1022      Component Value Date/Time   CALCIUM 9.4 01/18/2017 1145   CALCIUM 9.4 12/16/2016 1022   ALKPHOS 89 01/18/2017 1145   ALKPHOS 77 12/16/2016 1022   AST 21 01/18/2017 1145   AST 15 12/16/2016 1022   ALT 11 (L) 01/18/2017 1145   ALT 16 12/16/2016 1022   BILITOT 0.5 01/18/2017 1145   BILITOT 0.60 12/16/2016 1022      Impression and Plan: Mr. Lafavor is a very pleasant 57 yo African American gentleman with metastatic prostate cancer. His PSA was quite elevated in November at 7.1. Testosterone was < 3.  He has been taking Zytiga but did not follow-up when he did not receive the Casodex. I spoke with his pharmacy  rep and they will fill today.  We will resume Zometa in February. He will also be due for Lupron at that time.  He cancelled his AXUMIN PET scan in December. We will try to reschedule this.  We will plan to see him back in another month for follow-up, lab and injections.  Fentanyl Durgasic patch and Cartia XT were refilled today.  He will contact our office with any questions or concerns.  We can certainly see him sooner if need be.   Laverna Peace, NP 1/9/201911:39  AM

## 2017-02-05 LAB — PROSTATE-SPECIFIC AG, SERUM (LABCORP): Prostate Specific Ag, Serum: 31.7 ng/mL — ABNORMAL HIGH (ref 0.0–4.0)

## 2017-02-05 LAB — TESTOSTERONE: Testosterone: <3 ng/dL — ABNORMAL LOW (ref 264–916)

## 2017-02-12 ENCOUNTER — Other Ambulatory Visit: Payer: Self-pay | Admitting: Hematology & Oncology

## 2017-02-12 MED FILL — BICALUTAMIDE 50 MG TABLET: 50 | 30 days supply | Qty: 30 | Fill #2

## 2017-02-16 ENCOUNTER — Other Ambulatory Visit: Payer: Self-pay

## 2017-02-16 DIAGNOSIS — R52 Pain, unspecified: Secondary | ICD-10-CM

## 2017-02-16 DIAGNOSIS — C61 Malignant neoplasm of prostate: Secondary | ICD-10-CM

## 2017-02-16 MED ORDER — OXYCODONE-ACETAMINOPHEN 10-325 MG PO TABS: 1.0000 | ORAL_TABLET | Freq: Four times a day (QID) | ORAL | 0 refills | Status: DC | PRN

## 2017-02-23 ENCOUNTER — Emergency Department (HOSPITAL_BASED_OUTPATIENT_CLINIC_OR_DEPARTMENT_OTHER)
Admission: EM | Admit: 2017-02-23 | Discharge: 2017-02-23 | Disposition: A | Discharge status: Home / Self Care | Payer: BLUE CROSS/BLUE SHIELD | Attending: Physician Assistant | Admitting: Physician Assistant

## 2017-02-23 ENCOUNTER — Encounter (HOSPITAL_BASED_OUTPATIENT_CLINIC_OR_DEPARTMENT_OTHER): Payer: Self-pay | Admitting: Emergency Medicine

## 2017-02-23 ENCOUNTER — Other Ambulatory Visit: Payer: Self-pay

## 2017-02-23 ENCOUNTER — Emergency Department (HOSPITAL_BASED_OUTPATIENT_CLINIC_OR_DEPARTMENT_OTHER): Payer: BLUE CROSS/BLUE SHIELD

## 2017-02-23 DIAGNOSIS — G8929 Other chronic pain: Secondary | ICD-10-CM | POA: Diagnosis not present

## 2017-02-23 DIAGNOSIS — C799 Secondary malignant neoplasm of unspecified site: Secondary | ICD-10-CM | POA: Insufficient documentation

## 2017-02-23 DIAGNOSIS — M549 Dorsalgia, unspecified: Secondary | ICD-10-CM | POA: Insufficient documentation

## 2017-02-23 DIAGNOSIS — F17210 Nicotine dependence, cigarettes, uncomplicated: Secondary | ICD-10-CM | POA: Diagnosis not present

## 2017-02-23 DIAGNOSIS — Z79899 Other long term (current) drug therapy: Secondary | ICD-10-CM | POA: Diagnosis not present

## 2017-02-23 DIAGNOSIS — C61 Malignant neoplasm of prostate: Secondary | ICD-10-CM | POA: Insufficient documentation

## 2017-02-23 DIAGNOSIS — R112 Nausea with vomiting, unspecified: Secondary | ICD-10-CM | POA: Diagnosis present

## 2017-02-23 LAB — I-STAT CG4 LACTIC ACID, ED
Lactic Acid, Venous: 3.29 mmol/L (ref 0.5–1.9)
Lactic Acid, Venous: 1.98 mmol/L — ABNORMAL HIGH (ref 0.5–1.9)

## 2017-02-23 LAB — COMPREHENSIVE METABOLIC PANEL
ALBUMIN: 4.8 g/dL (ref 3.5–5.0)
ALK PHOS: 116 U/L (ref 38–126)
ALT: 20 U/L (ref 17–63)
AST: 18 U/L (ref 15–41)
Anion gap: 15 (ref 5–15)
BUN: 6 mg/dL (ref 6–20)
CO2: 20 mmol/L — AB (ref 22–32)
Calcium: 9.9 mg/dL (ref 8.9–10.3)
Chloride: 102 mmol/L (ref 101–111)
Creatinine, Ser: 0.74 mg/dL (ref 0.61–1.24)
GFR calc Af Amer: >60 mL/min (ref >60)
GFR calc non Af Amer: >60 mL/min (ref >60)
GLUCOSE: 119 mg/dL — AB (ref 65–99)
POTASSIUM: 3.4 mmol/L — AB (ref 3.5–5.1)
Sodium: 137 mmol/L (ref 135–145)
TOTAL PROTEIN: 8.8 g/dL — AB (ref 6.5–8.1)
Total Bilirubin: 0.6 mg/dL (ref 0.3–1.2)

## 2017-02-23 LAB — CBC WITH DIFFERENTIAL/PLATELET
Basophils Absolute: 0 10*3/uL (ref 0.0–0.1)
Basophils Relative: 0 %
EOS ABS: 0.1 10*3/uL (ref 0.0–0.7)
Eosinophils Relative: 1 %
HEMATOCRIT: 39 % (ref 39.0–52.0)
Hemoglobin: 13.6 g/dL (ref 13.0–17.0)
LYMPHS ABS: 2.1 10*3/uL (ref 0.7–4.0)
LYMPHS PCT: 22 %
MCH: 29.8 pg (ref 26.0–34.0)
MCHC: 34.9 g/dL (ref 30.0–36.0)
MCV: 85.3 fL (ref 78.0–100.0)
MONOS PCT: 12 %
Monocytes Absolute: 1.2 10*3/uL — ABNORMAL HIGH (ref 0.1–1.0)
NEUTROS ABS: 6.3 10*3/uL (ref 1.7–7.7)
NEUTROS PCT: 65 %
Platelets: 199 10*3/uL (ref 150–400)
RBC: 4.57 MIL/uL (ref 4.22–5.81)
RDW: 13.8 % (ref 11.5–15.5)
WBC: 9.7 10*3/uL (ref 4.0–10.5)

## 2017-02-23 LAB — URINALYSIS, ROUTINE W REFLEX MICROSCOPIC
BILIRUBIN URINE: NEGATIVE
Glucose, UA: NEGATIVE mg/dL
Hgb urine dipstick: NEGATIVE
Ketones, ur: NEGATIVE mg/dL
Leukocytes, UA: NEGATIVE
NITRITE: NEGATIVE
PH: 8 (ref 5.0–8.0)
Protein, ur: NEGATIVE mg/dL
SPECIFIC GRAVITY, URINE: 1.015 (ref 1.005–1.030)

## 2017-02-23 MED ORDER — SODIUM CHLORIDE 0.9 % IV BOLUS (SEPSIS)
1000.0000 mL | Freq: Once | INTRAVENOUS | Status: AC
  Administered 2017-02-23: 1000 mL via INTRAVENOUS

## 2017-02-23 MED ORDER — GI COCKTAIL ~~LOC~~
30.0000 mL | Freq: Once | ORAL | Status: AC
  Administered 2017-02-23: 30 mL via ORAL
  Filled 2017-02-23: qty 30

## 2017-02-23 MED ORDER — IOPAMIDOL (ISOVUE-300) INJECTION 61%
100.0000 mL | Freq: Once | INTRAVENOUS | Status: AC | PRN
  Administered 2017-02-23: 100 mL via INTRAVENOUS

## 2017-02-23 MED ORDER — SODIUM CHLORIDE 0.9 % IV BOLUS (SEPSIS): 1000.0000 mL | Freq: Once | INTRAVENOUS | Status: DC

## 2017-02-23 MED ORDER — ONDANSETRON HCL 4 MG/2ML IJ SOLN
4.0000 mg | Freq: Once | INTRAMUSCULAR | Status: AC
  Administered 2017-02-23: 4 mg via INTRAVENOUS
  Filled 2017-02-23: qty 2

## 2017-02-23 MED ORDER — ONDANSETRON 4 MG PO TBDP: 4.0000 mg | ORAL_TABLET | Freq: Three times a day (TID) | ORAL | 0 refills | Status: DC | PRN

## 2017-02-23 NOTE — ED Provider Notes (Signed)
[facility] EMERGENCY DEPARTMENT Provider Note   CSN: [id number] Arrival date & time: 02/23/17  1655     History   Chief Complaint Chief Complaint  Patient presents with  . Emesis    HPI Ryan Davis is a 57 y.o. male.  HPI  Patient is a 57 year old male with history of chronic back pain, and prostate cancer presenting today with nausea and vomiting.  Patient is here he states "I have cancer and I have pain and vomiting".  Patient does have prostate cancer but is being treated as an outpatient for the last year.  Patient reports nausea vomiting started yesterday.  He reports he is been unable to hold anything down the whole weekend.    .  He also complains of chronic back pain.  He is already been on placed on fentanyl patch for this back pain.  He reports the back pain is worse with movement he has trouble going from lying down to standing up secondary to the pain.  The back pain and abdominal nausea vomiting, unrelated  Past Medical History:  Diagnosis Date  . A-fib [facility])    see 03-28-16 admission  . Back pain without radiculopathy 03/28/2016  . Dyspnea    endorses since treatment radiation , still gets SOB occasionally   . Dysrhythmia    afib   . History of radiation therapy 09/16/16-10/06/16   35 Gy in 14 fractions to the lumbar spine  . Nocturia   . Prostate cancer metastatic to bone ([place]) 03/31/2016  . Prostate cancer metastatic to multiple sites [facility]) 03/31/2016    Patient Active Problem List   Diagnosis Date Noted  . 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[place]) 03/31/2016  . Prostate cancer metastatic to bone ([place]) 03/31/2016  . Atrial fibrillation ([place]) 03/30/2016  . Back pain without radiculopathy 03/28/2016  . Atrial fibrillation with RVR ([place]) 03/28/2016    Past Surgical History:  Procedure Laterality Date  . MULTIPLE EXTRACTIONS WITH ALVEOLOPLASTY N/A 11/19/2016   Procedure: EXTRACTION OF TOOTH #'S 1,3,5-7,9-17, AND 20- 29 WITH  ALVEOLOPLASTY, BILATERAL MANDIBULAR TORI REDUCTIONS AND BILATERAL MAXILLARY BUCCAL EXOSTOSES REDUCTIONS;  Surgeon: Lenn Cal, DDS;  Location: [facility] ORS;  Service: Oral Surgery;  Laterality: N/A;  . NO PAST SURGERIES         Home Medications    Prior to Admission medications   Medication Sig Start Date End Date Taking? Authorizing Provider  bicalutamide (CASODEX) 50 MG tablet Take 1 tablet (50 mg total) by mouth daily. 02/04/17   [name], MD  Calcium Carbonate-Vitamin D (CALCIUM-VITAMIN D3 PO) Take 1 tablet by mouth daily.    [provider]  CARTIA XT 240 MG 24 hr capsule TAKE 1 CAPSULE BY MOUTH EVERY DAY 02/12/17   [name], MD  diltiazem (CARTIA XT) 240 MG 24 hr capsule TAKE 1 CAPSULE BY MOUTH EVERY DAY 02/04/17   [place], Holli Humbles, NP  emollient (BIAFINE) cream Apply 1 application topically as needed (for back).    [provider]  fentaNYL (DURAGESIC) 50 MCG/HR Place 1 patch (50 mcg total) onto the skin every 3 (three) days. 02/04/17   [place], Holli Humbles, NP  loperamide (IMODIUM) 2 MG capsule Take 2 mg by mouth as needed for diarrhea or loose stools.    [provider]  megestrol (MEGACE ES) 625 MG/5ML suspension Take 5 mLs (625 mg total) by mouth 2 (two) times daily. 12/16/16   [place], Holli Humbles, NP  metoprolol tartrate (LOPRESSOR) 25  MG tablet Take 0.5 tablets (12.5 mg total) by mouth 2 (two) times daily. 07/28/16   [name], MD  ondansetron (ZOFRAN ODT) 4 MG disintegrating tablet Take 1 tablet (4 mg total) by mouth every 8 (eight) hours as needed. 01/18/17   Isla Pence, MD  oxyCODONE-acetaminophen (PERCOCET) 10-325 MG tablet Take 1 tablet by mouth every 6 (six) hours as needed for pain. 02/16/17   [place], Rudell Cobb, MD  ZYTIGA 250 MG tablet TAKE 4 TABLETS BY MOUTH EVERY DAY ON AN EMPTY STOMACH EITHER 1 HOUR BEFORE OR 2 HOURS AFTER A MEAL 12/15/16   [name], MD    Family History Family History  Problem Relation Age of  Onset  . Stroke Sister   . Cancer Neg Hx   . Heart disease Neg Hx   . Diabetes Neg Hx     Social History Social History   Tobacco Use  . Smoking status: Current Some Day Smoker    Packs/day: 0.25    Years: 39.00    Pack years: 9.75    Types: Cigarettes    Last attempt to quit: 10/13/2016    Years since quitting: 0.3  . Smokeless tobacco: Never Used  . Tobacco comment: 04/18/2016 still smokes; 11-17-16 states some day smoker   Substance Use Topics  . Alcohol use: No    Comment: has not drank in 7 months.  He used to drink 40oz  of beer daily  . Drug use: No     Allergies   Patient has no known allergies.   Review of Systems Review of Systems  Constitutional: Positive for fatigue. Negative for activity change and fever.  Respiratory: Negative for shortness of breath.   Cardiovascular: Negative for chest pain.  Gastrointestinal: Positive for abdominal pain, nausea and vomiting. Negative for diarrhea.  Musculoskeletal: Positive for back pain.  All other systems reviewed and are negative.    Physical Exam Updated Vital Signs BP (!) 155/108 (BP Location: Left Arm)   Pulse 74   Temp 97.8 F (36.6 C) (Oral)   Resp (!) 22   Ht 6' 2.5" (1.892 m)   Wt 68.9 kg (152 lb)   SpO2 100%   BMI 19.25 kg/m   Physical Exam  Constitutional: He is oriented to person, place, and time. He appears well-nourished.  Dry mucous membranes.  HENT:  Head: Normocephalic.  Eyes: Conjunctivae are normal.  Cardiovascular: Normal rate and regular rhythm.  No murmur heard. Pulmonary/Chest: Effort normal and breath sounds normal. No respiratory distress.  Abdominal: Soft. There is tenderness.  Diffuse tenderness.  Worse in the lower abdomen.  Musculoskeletal:  Tenderness in the paraspinal region of the lower spine.  Neurological: He is oriented to person, place, and time.  Skin: Skin is warm and dry. He is not diaphoretic.  Psychiatric: He has a normal mood and affect. His behavior is  normal.     ED Treatments / Results  Labs (all labs ordered are listed, but only abnormal results are displayed) Labs Reviewed  COMPREHENSIVE METABOLIC PANEL  CBC WITH DIFFERENTIAL/PLATELET  URINALYSIS, ROUTINE W REFLEX MICROSCOPIC  I-STAT CG4 LACTIC ACID, ED    EKG  EKG Interpretation None       Radiology No results found.  Procedures Procedures (including critical care time)  Emergency Ultrasound:  US Guidance for needle guidance  Performed by Dr. Thomasene Lot Indication: IV access Linear probe used in real-time to visualize location of needle entry through skin. Interpretation: IV inserted into peripheral vein. Images not  archived electronically.   Medications Ordered in ED Medications  sodium chloride 0.9 % bolus 1,000 mL (not administered)     Initial Impression / Assessment and Plan / ED Course  I have reviewed the triage vital signs and the nursing notes.  Pertinent labs & imaging results that were available during my care of the patient were reviewed by me and considered in my medical decision making (see chart for details).     Patient is a 57 year old male with history of chronic back pain, and prostate cancer presenting today with nausea and vomiting.  Patient is here he states "I have cancer and I have pain and vomiting".  Patient does have prostate cancer but is being treated as an outpatient for the last year.  Patient reports nausea vomiting started yesterday.  He reports he is been unable to hold anything down the whole weekend.    .  He also complains of chronic back pain.  He is already been on placed on fentanyl patch for this back pain.  He reports the back pain is worse with movement he has trouble going from lying down to standing up secondary to the pain.  The back pain and abdominal nausea vomiting, unrelated  5:57 PM Will get labs, give fluids get CT abdomen to make sure there is no intervenable cause for his nausea vomiting diarrhea.  Has  not had any nausea medicine at home.  We will give him fluids and nausea medicine here.   10:45 PM Labs relatively normal.  Had one isolated elevated lactate likely from difficulty placing line.  Lactic was repeated and is completely normal now.  Patient was able to eat and drink.  Continue to have normal vital signs.  Creatinine showed no evidence of dehydration.  Patient requesting Zofran to go home with.  Patient's belly is soft.  Suspect viral gastroenteritis we will have him follow-up with primary care physician.  Final Clinical Impressions(s) / ED Diagnoses   Final diagnoses:  None    ED Discharge Orders    None       Macarthur Critchley, MD 02/23/17 2246

## 2017-02-23 NOTE — ED Triage Notes (Signed)
Patient states that he is a cancer patient and he has been vomiting all week. The patient reports that he cant keep anything down. He is on some medications to help with the Nausea and pain and they are not helping

## 2017-02-23 NOTE — Discharge Instructions (Signed)
Please follow-up with your primary care physician.  Your CAT scan was reassuring as well as your labs.  You were dehydrated.  Recommend that you try to stay hydrated with mix of Gatorade and water. You can use Zofran, the nausea medication to help with your symptoms.

## 2017-02-23 NOTE — ED Notes (Signed)
Multiple IV attempts by Lalla Brothers and Steve,RT without success; EDP at bedside with ultrasound IV at this time

## 2017-03-02 ENCOUNTER — Other Ambulatory Visit: Payer: Self-pay | Admitting: *Deleted

## 2017-03-02 MED ORDER — ENSURE ORIGINAL PO LIQD: 1.0000 | Freq: Four times a day (QID) | ORAL | 11 refills | Status: DC

## 2017-03-03 ENCOUNTER — Encounter (HOSPITAL_COMMUNITY)
Admission: RE | Admit: 2017-03-03 | Discharge: 2017-03-03 | Disposition: A | Discharge status: Home / Self Care | Payer: BLUE CROSS/BLUE SHIELD | Source: Ambulatory Visit | Attending: Family | Admitting: Family

## 2017-03-03 DIAGNOSIS — C7951 Secondary malignant neoplasm of bone: Secondary | ICD-10-CM | POA: Diagnosis present

## 2017-03-03 DIAGNOSIS — C61 Malignant neoplasm of prostate: Secondary | ICD-10-CM | POA: Diagnosis not present

## 2017-03-05 ENCOUNTER — Other Ambulatory Visit: Payer: Self-pay

## 2017-03-05 ENCOUNTER — Inpatient Hospital Stay: Payer: BLUE CROSS/BLUE SHIELD | Attending: Hematology & Oncology | Admitting: Family

## 2017-03-05 ENCOUNTER — Inpatient Hospital Stay: Payer: BLUE CROSS/BLUE SHIELD

## 2017-03-05 ENCOUNTER — Encounter: Payer: Self-pay | Admitting: Family

## 2017-03-05 VITALS — BP 143/93 | HR 93 | Temp 98.3°F | Resp 20 | Wt 143.0 lb

## 2017-03-05 DIAGNOSIS — N3000 Acute cystitis without hematuria: Secondary | ICD-10-CM

## 2017-03-05 DIAGNOSIS — K219 Gastro-esophageal reflux disease without esophagitis: Secondary | ICD-10-CM | POA: Diagnosis not present

## 2017-03-05 DIAGNOSIS — C7951 Secondary malignant neoplasm of bone: Secondary | ICD-10-CM | POA: Diagnosis not present

## 2017-03-05 DIAGNOSIS — C61 Malignant neoplasm of prostate: Secondary | ICD-10-CM | POA: Diagnosis not present

## 2017-03-05 DIAGNOSIS — M5441 Lumbago with sciatica, right side: Secondary | ICD-10-CM

## 2017-03-05 DIAGNOSIS — G8929 Other chronic pain: Secondary | ICD-10-CM

## 2017-03-05 DIAGNOSIS — N390 Urinary tract infection, site not specified: Secondary | ICD-10-CM | POA: Diagnosis not present

## 2017-03-05 DIAGNOSIS — M5442 Lumbago with sciatica, left side: Secondary | ICD-10-CM

## 2017-03-05 DIAGNOSIS — G893 Neoplasm related pain (acute) (chronic): Secondary | ICD-10-CM | POA: Insufficient documentation

## 2017-03-05 DIAGNOSIS — Z5111 Encounter for antineoplastic chemotherapy: Secondary | ICD-10-CM | POA: Insufficient documentation

## 2017-03-05 DIAGNOSIS — E860 Dehydration: Secondary | ICD-10-CM

## 2017-03-05 LAB — URINALYSIS, COMPLETE (UACMP) WITH MICROSCOPIC
Bilirubin Urine: NEGATIVE
Glucose, UA: NEGATIVE mg/dL
Hgb urine dipstick: NEGATIVE
Ketones, ur: 15 mg/dL — AB
Nitrite: NEGATIVE
PH: 6.5 (ref 5.0–8.0)
Protein, ur: NEGATIVE mg/dL
SPECIFIC GRAVITY, URINE: 1.025 (ref 1.005–1.030)

## 2017-03-05 LAB — CBC WITH DIFFERENTIAL (CANCER CENTER ONLY)
BASOS ABS: 0 10*3/uL (ref 0.0–0.1)
BASOS PCT: 0 %
EOS ABS: 0.1 10*3/uL (ref 0.0–0.5)
EOS PCT: 2 %
HCT: 41.6 % (ref 38.7–49.9)
Hemoglobin: 14.8 g/dL (ref 13.0–17.1)
Lymphocytes Relative: 25 %
Lymphs Abs: 1.7 10*3/uL (ref 0.9–3.3)
MCH: 30.1 pg (ref 28.0–33.4)
MCHC: 35.6 g/dL (ref 32.0–35.9)
MCV: 84.7 fL (ref 82.0–98.0)
MONO ABS: 0.5 10*3/uL (ref 0.1–0.9)
Monocytes Relative: 7 %
Neutro Abs: 4.5 10*3/uL (ref 1.5–6.5)
Neutrophils Relative %: 66 %
PLATELETS: 196 10*3/uL (ref 145–400)
RBC: 4.91 MIL/uL (ref 4.20–5.70)
RDW: 14.3 % (ref 11.1–15.7)
WBC Count: 6.8 10*3/uL (ref 4.0–10.0)

## 2017-03-05 LAB — CMP (CANCER CENTER ONLY)
ALK PHOS: 98 U/L — AB (ref 26–84)
ALT: 12 U/L (ref 0–55)
AST: 16 U/L (ref 5–34)
Albumin: 4.6 g/dL (ref 3.5–5.0)
Anion gap: 11 (ref 5–15)
BUN: 5 mg/dL — AB (ref 7–22)
CALCIUM: 9.5 mg/dL (ref 8.0–10.3)
CO2: 19 mmol/L (ref 18–33)
CREATININE: 0.6 mg/dL — AB (ref 0.70–1.30)
Chloride: 111 mmol/L — ABNORMAL HIGH (ref 98–108)
GLUCOSE: 106 mg/dL (ref 73–118)
Potassium: 3.1 mmol/L — ABNORMAL LOW (ref 3.3–4.7)
SODIUM: 141 mmol/L (ref 128–145)
Total Bilirubin: 1.3 mg/dL — ABNORMAL HIGH (ref 0.2–1.2)
Total Protein: 7.7 g/dL (ref 6.4–8.1)

## 2017-03-05 MED ORDER — HYDROMORPHONE HCL 1 MG/ML IJ SOLN
INTRAMUSCULAR | Status: AC
Start: 2017-03-05 — End: ?
  Filled 2017-03-05: qty 1

## 2017-03-05 MED ORDER — HYDROMORPHONE HCL 1 MG/ML IJ SOLN
1.0000 mg | Freq: Once | INTRAMUSCULAR | Status: AC
  Administered 2017-03-05: 1 mg via INTRAVENOUS

## 2017-03-05 MED ORDER — LEUPROLIDE ACETATE (3 MONTH) 11.25 MG IM KIT
11.2500 mg | PACK | Freq: Once | INTRAMUSCULAR | Status: AC
  Administered 2017-03-05: 11.25 mg via INTRAMUSCULAR
  Filled 2017-03-05: qty 11.25

## 2017-03-05 MED ORDER — FENTANYL 100 MCG/HR TD PT72: 100.0000 ug | MEDICATED_PATCH | TRANSDERMAL | 0 refills | Status: DC

## 2017-03-05 MED ORDER — SODIUM CHLORIDE 0.9 % IV SOLN
500.0000 mL | INTRAVENOUS | Status: DC
  Administered 2017-03-05: 12:00:00 via INTRAVENOUS

## 2017-03-05 MED FILL — fentaNYL 100 MCG/HR PT72: 100 | 30 days supply | Qty: 10 | Fill #0

## 2017-03-05 NOTE — Patient Instructions (Signed)
Hydromorphone injection What is this medicine? HYDROMORPHONE (hye droe MOR fone) is a pain reliever. It is used to treat moderate to severe pain. This medicine may be used for other purposes; ask your health care provider or pharmacist if you have questions. COMMON BRAND NAME(S): Dilaudid, Dilaudid-HP, Simplist Dilaudid What should I tell my health care provider before I take this medicine? They need to know if you have any of these conditions: -brain tumor -drug abuse or addiction -head injury -heart disease -if you often drink alcohol -kidney disease -liver disease -lung or breathing disease, like asthma -problems urinating -seizures -stomach or intestine problems -an unusual or allergic reaction to hydromorphone, other medicines, foods, dyes, or preservatives -pregnant or trying to get pregnant -breast-feeding How should I use this medicine? This medicine is for injection into a vein, into a muscle, or under the skin. It is usually given by a health care professional in a hospital or clinic setting. In rare cases, you might get this medicine at home. You will be taught how to give this medicine. Use exactly as directed. Take your medicine at regular intervals. Do not take your medicine more often than directed. It is important that you put your used needles and syringes in a special sharps container. Do not put them in a trash can. If you do not have a sharps container, call your pharmacist or healthcare provider to get one. Talk to your pediatrician regarding the use of this medicine in children. Special care may be needed. Overdosage: If you think you have taken too much of this medicine contact a poison control center or emergency room at once. NOTE: This medicine is only for you. Do not share this medicine with others. What if I miss a dose? If you miss a dose, use it as soon as you can. If it is almost time for your next dose, use only that dose. Do not use double or extra  doses. What may interact with this medicine? This medicine may interact with the following medications: -alcohol -antihistamines for allergy, cough and cold -certain medicines for anxiety or sleep -certain medicines for depression like amitriptyline, fluoxetine, sertraline -certain medicines for seizures like phenobarbital, primidone -general anesthetics like halothane, isoflurane, methoxyflurane, propofol -local anesthetics like lidocaine, pramoxine, tetracaine -MAOIs like Carbex, Eldepryl, Marplan, Nardil, and Parnate -medicines that relax muscles for surgery -other narcotic medicines for pain or cough -phenothiazines like chlorpromazine, mesoridazine, prochlorperazine, thioridazine This list may not describe all possible interactions. Give your health care provider a list of all the medicines, herbs, non-prescription drugs, or dietary supplements you use. Also tell them if you smoke, drink alcohol, or use illegal drugs. Some items may interact with your medicine. What should I watch for while using this medicine? Tell your doctor or health care professional if your pain does not go away, if it gets worse, or if you have new or a different type of pain. You may develop tolerance to the medicine. Tolerance means that you will need a higher dose of the medicine for pain relief. Tolerance is normal and is expected if you take this medicine for a long time. Do not suddenly stop taking your medicine because you may develop a severe reaction. Your body becomes used to the medicine. This does NOT mean you are addicted. Addiction is a behavior related to getting and using a drug for a non-medical reason. If you have pain, you have a medical reason to take pain medicine. Your doctor will tell you how much medicine  to take. If your doctor wants you to stop the medicine, the dose will be slowly lowered over time to avoid any side effects. There are different types of narcotic medicines (opiates). If you  take more than one type at the same time or if you are taking another medicine that also causes drowsiness, you may have more side effects. Give your health care provider a list of all medicines you use. Your doctor will tell you how much medicine to take. Do not take more medicine than directed. Call emergency for help if you have problems breathing or unusual sleepiness. You may get drowsy or dizzy. Do not drive, use machinery, or do anything that needs mental alertness until you know how this medicine affects you. Do not stand or sit up quickly, especially if you are an older patient. This reduces the risk of dizzy or fainting spells. Alcohol may interfere with the effect of this medicine. Avoid alcoholic drinks. This medicine will cause constipation. Try to have a bowel movement at least every 2 to 3 days. If you do not have a bowel movement for 3 days, call your doctor or health care professional. Your mouth may get dry. Chewing sugarless gum or sucking hard candy, and drinking plenty of water may help. Contact your doctor if the problem does not go away or is severe. What side effects may I notice from receiving this medicine? Side effects that you should report to your doctor or health care professional as soon as possible: -allergic reactions like skin rash, itching or hives, swelling of the face, lips, or tongue -breathing problems -confusion -seizures -signs and symptoms of low blood pressure like dizziness; feeling faint or lightheaded, falls; unusually weak or tired -trouble passing urine or change in the amount of urine Side effects that usually do not require medical attention (report to your doctor or health care professional if they continue or are bothersome): -constipation -dry mouth -nausea, vomiting -tiredness This list may not describe all possible side effects. Call your doctor for medical advice about side effects. You may report side effects to FDA at 1-800-FDA-1088. Where  should I keep my medicine? Keep out of the reach of children. This medicine can be abused. Keep your medicine in a safe place to protect it from theft. Do not share this medicine with anyone. Selling or giving away this medicine is dangerous and against the law. If you are using this medicine at home, you will be instructed on how to store this medicine. This medicine may cause accidental overdose and death if it is taken by other adults, children, or pets. Flush any unused medicine down the toilet to reduce the chance of harm. Do not use the medicine after the expiration date. NOTE: This sheet is a summary. It may not cover all possible information. If you have questions about this medicine, talk to your doctor, pharmacist, or health care provider.  2018 [employer]vier/Gold Standard (2014-10-06 12:59:15)

## 2017-03-05 NOTE — Progress Notes (Signed)
Hematology and Oncology Follow Up Visit  Ryan Davis [id number] 09-19-1960 57 y.o. 03/05/2017   Principle Diagnosis:  Metastatic prostate cancer - androgen sensitive  Past Therapy: 14 fractions of radiation to the lumbar spine   Current Therapy:   Lupron 11.5 mg IM every 3 month - next dose due May 2019 Casodex 50 mg by mouth daily Zytiga 1000 mg by mouth daily/prednisone 10 mg by mouth daily - started on 04/20/2016 Zometa 4 mg IV q month -on hold after dental surgery (teeth extraction) on 11/19/16   Interim History:  Ryan Davis is here today for follow-up. He is having a great deal of pain in the back, abdomen and legs. Her pain makes him feel SOB at times. He has been taking his percocet off and on and is wearing his Fentanyl patch. I spoke with Dr. Marin Olp and Lattie Haw in pharmacy and we will increase his patch to 100 mcg.  He went to the ED last week for abdominal pain, n/v/d and dehydration. He was treated with fluids and nausea medicine. CT was negative.  His PET scan showed stable skeletal metastatic disease.  His testosterone is < 3. His PSA is up at 82.7. It is hard to tell if he is taking his medication properly. He has had issues with noncompliance in the past.  He is still taking care if his wife who has multiple health issues as well.  No fever, chills, cough, rash, chest pain, palpitations or changes in bladder habits.  He has no appetite and has had some GERD. He is not always hydrating as well as he should at home. His weight is down 9 lbs since his last visit. He is not taking the Megace.  No bleeding, bruising or petechiae. No lymphadenopathy found on exam.   ECOG Performance Status: 2 - Symptomatic, <50% confined to bed  Medications:  Allergies as of 03/05/2017   No Known Allergies     Medication List        Accurate as of 03/05/17 10:55 AM. Always use your most recent med list.          bicalutamide 50 MG tablet Commonly known as:  CASODEX Take 1 tablet (50  mg total) by mouth daily.   CALCIUM-VITAMIN D3 PO Take 1 tablet by mouth daily.   diltiazem 240 MG 24 hr capsule Commonly known as:  CARTIA XT TAKE 1 CAPSULE BY MOUTH EVERY DAY   CARTIA XT 240 MG 24 hr capsule Generic drug:  diltiazem TAKE 1 CAPSULE BY MOUTH EVERY DAY   emollient cream Commonly known as:  BIAFINE Apply 1 application topically as needed (for back).   ENSURE ORIGINAL Liqd Take 1 Can by mouth 4 (four) times daily.   fentaNYL 50 MCG/HR Commonly known as:  DURAGESIC Place 1 patch (50 mcg total) onto the skin every 3 (three) days.   loperamide 2 MG capsule Commonly known as:  IMODIUM Take 2 mg by mouth as needed for diarrhea or loose stools.   megestrol 625 MG/5ML suspension Commonly known as:  MEGACE ES Take 5 mLs (625 mg total) by mouth 2 (two) times daily.   metoprolol tartrate 25 MG tablet Commonly known as:  LOPRESSOR Take 0.5 tablets (12.5 mg total) by mouth 2 (two) times daily.   ondansetron 4 MG disintegrating tablet Commonly known as:  ZOFRAN ODT Take 1 tablet (4 mg total) by mouth every 8 (eight) hours as needed.   ondansetron 4 MG disintegrating tablet Commonly known as:  ZOFRAN ODT  Take 1 tablet (4 mg total) by mouth every 8 (eight) hours as needed for nausea or vomiting.   oxyCODONE-acetaminophen 10-325 MG tablet Commonly known as:  PERCOCET Take 1 tablet by mouth every 6 (six) hours as needed for pain.   ZYTIGA 250 MG tablet Generic drug:  abiraterone acetate TAKE 4 TABLETS BY MOUTH EVERY DAY ON AN EMPTY STOMACH EITHER 1 HOUR BEFORE OR 2 HOURS AFTER A MEAL       Allergies: No Known Allergies  Past Medical History, Surgical history, Social history, and Family History were reviewed and updated.  Review of Systems: All other 10 point review of systems is negative.   Physical Exam:  vitals were not taken for this visit.   Wt Readings from Last 3 Encounters:  02/23/17 152 lb (68.9 kg)  02/04/17 152 lb (68.9 kg)  01/18/17 158 lb  (71.7 kg)    Ocular: Sclerae unicteric, pupils equal, round and reactive to light Ear-nose-throat: Oropharynx clear, dentition fair Lymphatic: No cervical, supraclavicular or axillary adenopathy Lungs no rales or rhonchi, good excursion bilaterally Heart regular rate and rhythm, no murmur appreciated Abd soft, nontender, positive bowel sounds, no liver or spleen tip palpated on exam, no fluid wave  MSK chronic pain in the legs and lower back Neuro: non-focal, well-oriented, appropriate affect Breasts: Deferred   Lab Results  Component Value Date   WBC 6.8 03/05/2017   HGB 13.6 02/23/2017   HCT 41.6 03/05/2017   MCV 84.7 03/05/2017   PLT 196 03/05/2017   Lab Results  Component Value Date   FERRITIN 216 02/04/2017   IRON 82 02/04/2017   TIBC 223 02/04/2017   UIBC 140 02/04/2017   IRONPCTSAT 37 (L) 02/04/2017   Lab Results  Component Value Date   RBC 4.91 03/05/2017   Lab Results  Component Value Date   KAPLAMBRATIO 0.89 03/28/2016   Lab Results  Component Value Date   IGGSERUM 1,066 03/28/2016   IGMSERUM 106 03/28/2016   No results found for: Odetta Pink, SPEI   Chemistry      Component Value Date/Time   NA 137 02/23/2017 1755   NA 143 12/16/2016 1022   K 3.4 (L) 02/23/2017 1755   K 3.5 12/16/2016 1022   CL 102 02/23/2017 1755   CL 101 12/16/2016 1022   CO2 20 (L) 02/23/2017 1755   CO2 33 12/16/2016 1022   BUN 6 02/23/2017 1755   BUN 4 (L) 12/16/2016 1022   CREATININE 0.74 02/23/2017 1755   CREATININE 0.80 02/04/2017 1111   CREATININE 0.8 12/16/2016 1022      Component Value Date/Time   CALCIUM 9.9 02/23/2017 1755   CALCIUM 9.4 12/16/2016 1022   ALKPHOS 116 02/23/2017 1755   ALKPHOS 77 12/16/2016 1022   AST 18 02/23/2017 1755   AST 17 02/04/2017 1111   ALT 20 02/23/2017 1755   ALT 16 02/04/2017 1111   ALT 16 12/16/2016 1022   BILITOT 0.6 02/23/2017 1755   BILITOT 0.7 02/04/2017 1111       Impression and Plan: Ryan Davis is a pleasant 57 yo African American gentleman with metastatic prostate cancer. He is having a lot of pain in the back and legs due to his bone metastasis.  He was given IV fluids and pain medication while here today. He states that he is feeling much better.  He also received Lupron today.  His PSA trending up is concerning. I spoke with Dr. Marin Olp and we will continue  to follow along with him. Hopefully he will be better compliant with his medication.   We will increase his Fentanyl patch to 100 mcg. Percocet prescription will stay the same.  His urinalysis was positive for UTI. We will get him onto Bactrim DS BID for 5 days.  We will plan to see him in another month for follow-up and lab.  He will contact our office with any questions or concerns. We can certainly see him sooner if need be.   Laverna Peace, NP 2/7/201910:55 AM

## 2017-03-06 LAB — TESTOSTERONE

## 2017-03-06 LAB — URINE CULTURE: CULTURE: NO GROWTH

## 2017-03-06 LAB — PROSTATE-SPECIFIC AG, SERUM (LABCORP): Prostate Specific Ag, Serum: 82.7 ng/mL — ABNORMAL HIGH (ref 0.0–4.0)

## 2017-03-06 MED ORDER — SULFAMETHOXAZOLE-TRIMETHOPRIM 800-160 MG PO TABS: 1.0000 | ORAL_TABLET | Freq: Two times a day (BID) | ORAL | 0 refills | Status: DC

## 2017-03-14 ENCOUNTER — Other Ambulatory Visit: Payer: Self-pay | Admitting: Hematology & Oncology

## 2017-03-18 ENCOUNTER — Other Ambulatory Visit: Payer: Self-pay | Admitting: *Deleted

## 2017-03-18 DIAGNOSIS — R52 Pain, unspecified: Secondary | ICD-10-CM

## 2017-03-18 DIAGNOSIS — C61 Malignant neoplasm of prostate: Secondary | ICD-10-CM

## 2017-03-18 MED ORDER — OXYCODONE-ACETAMINOPHEN 10-325 MG PO TABS: 1.0000 | ORAL_TABLET | Freq: Four times a day (QID) | ORAL | 0 refills | Status: DC | PRN

## 2017-03-23 ENCOUNTER — Telehealth: Payer: Self-pay | Admitting: *Deleted

## 2017-03-23 ENCOUNTER — Other Ambulatory Visit: Payer: Self-pay | Admitting: Family

## 2017-03-23 DIAGNOSIS — M79661 Pain in right lower leg: Secondary | ICD-10-CM

## 2017-03-23 DIAGNOSIS — M79662 Pain in left lower leg: Secondary | ICD-10-CM

## 2017-03-23 NOTE — Telephone Encounter (Signed)
Patient states he has bilateral swelling of his feet to the point he can't see his toes. He states they are both very swollen and partially numb. At the time he states he is soaking them in hot water.  Instructed the patient to not soak his feet in hot water since his sensations have been affected, and the heat will not help with the swelling.   Reviewed with Dr Marin Olp. He would like patient to have doppler scans to r/o DVTs.   Called patient and gave him the number to schedule his dopplers. He wrote down number and said he would call.

## 2017-03-25 ENCOUNTER — Ambulatory Visit (HOSPITAL_BASED_OUTPATIENT_CLINIC_OR_DEPARTMENT_OTHER)
Admission: RE | Admit: 2017-03-25 | Discharge: 2017-03-25 | Disposition: A | Discharge status: Home / Self Care | Payer: BLUE CROSS/BLUE SHIELD | Source: Ambulatory Visit | Attending: Family | Admitting: Family

## 2017-03-25 DIAGNOSIS — M79662 Pain in left lower leg: Secondary | ICD-10-CM | POA: Insufficient documentation

## 2017-03-25 DIAGNOSIS — M79661 Pain in right lower leg: Secondary | ICD-10-CM | POA: Insufficient documentation

## 2017-03-25 DIAGNOSIS — M7989 Other specified soft tissue disorders: Secondary | ICD-10-CM | POA: Diagnosis present

## 2017-03-26 ENCOUNTER — Telehealth: Payer: Self-pay | Admitting: Hematology & Oncology

## 2017-03-26 ENCOUNTER — Telehealth: Payer: Self-pay | Admitting: *Deleted

## 2017-03-26 DIAGNOSIS — N3000 Acute cystitis without hematuria: Secondary | ICD-10-CM

## 2017-03-26 MED ORDER — SULFAMETHOXAZOLE-TRIMETHOPRIM 800-160 MG PO TABS: 1.0000 | ORAL_TABLET | Freq: Two times a day (BID) | ORAL | 0 refills | Status: DC

## 2017-03-26 NOTE — Telephone Encounter (Signed)
Faxed medical record to: [place] [facility] F: [phone]

## 2017-03-26 NOTE — Telephone Encounter (Addendum)
Patient is aware of results. While on the phone patient states he's still having symptoms of a UTI and wants another round of antibiotics. Spoke with Dr Marin Olp. A antibiotic refill will be sent.    ----- Message from Eliezer Bottom, NP sent at 03/25/2017  1:09 PM EST ----- No evidence of DVT. Thank you!   ----- Message ----- From: Interface, Rad Results In Sent: 03/25/2017  12:43 PM To: Eliezer Bottom, NP

## 2017-04-02 ENCOUNTER — Other Ambulatory Visit: Payer: Self-pay

## 2017-04-02 ENCOUNTER — Inpatient Hospital Stay: Payer: BLUE CROSS/BLUE SHIELD

## 2017-04-02 ENCOUNTER — Encounter: Payer: Self-pay | Admitting: Family

## 2017-04-02 ENCOUNTER — Other Ambulatory Visit: Payer: Self-pay | Admitting: Family

## 2017-04-02 ENCOUNTER — Inpatient Hospital Stay: Payer: BLUE CROSS/BLUE SHIELD | Attending: Hematology & Oncology | Admitting: Family

## 2017-04-02 VITALS — BP 125/81 | HR 85 | Temp 97.7°F | Resp 18 | Wt 149.0 lb

## 2017-04-02 DIAGNOSIS — C61 Malignant neoplasm of prostate: Secondary | ICD-10-CM | POA: Insufficient documentation

## 2017-04-02 DIAGNOSIS — R52 Pain, unspecified: Secondary | ICD-10-CM | POA: Insufficient documentation

## 2017-04-02 DIAGNOSIS — R5383 Other fatigue: Secondary | ICD-10-CM | POA: Insufficient documentation

## 2017-04-02 DIAGNOSIS — C7951 Secondary malignant neoplasm of bone: Principal | ICD-10-CM

## 2017-04-02 DIAGNOSIS — R0602 Shortness of breath: Secondary | ICD-10-CM | POA: Insufficient documentation

## 2017-04-02 DIAGNOSIS — K5900 Constipation, unspecified: Secondary | ICD-10-CM | POA: Insufficient documentation

## 2017-04-02 DIAGNOSIS — R300 Dysuria: Secondary | ICD-10-CM

## 2017-04-02 DIAGNOSIS — G8929 Other chronic pain: Secondary | ICD-10-CM

## 2017-04-02 DIAGNOSIS — G893 Neoplasm related pain (acute) (chronic): Secondary | ICD-10-CM

## 2017-04-02 DIAGNOSIS — M5442 Lumbago with sciatica, left side: Secondary | ICD-10-CM

## 2017-04-02 DIAGNOSIS — E860 Dehydration: Secondary | ICD-10-CM

## 2017-04-02 DIAGNOSIS — Z79899 Other long term (current) drug therapy: Secondary | ICD-10-CM | POA: Insufficient documentation

## 2017-04-02 DIAGNOSIS — R3989 Other symptoms and signs involving the genitourinary system: Secondary | ICD-10-CM

## 2017-04-02 DIAGNOSIS — M5441 Lumbago with sciatica, right side: Secondary | ICD-10-CM

## 2017-04-02 LAB — CBC WITH DIFFERENTIAL (CANCER CENTER ONLY)
BASOS ABS: 0 10*3/uL (ref 0.0–0.1)
Basophils Relative: 0 %
Eosinophils Absolute: 0.4 10*3/uL (ref 0.0–0.5)
Eosinophils Relative: 4 %
HCT: 39.3 % (ref 38.7–49.9)
HEMOGLOBIN: 13.5 g/dL (ref 13.0–17.1)
LYMPHS ABS: 1.8 10*3/uL (ref 0.9–3.3)
LYMPHS PCT: 20 %
MCH: 30 pg (ref 28.0–33.4)
MCHC: 34.4 g/dL (ref 32.0–35.9)
MCV: 87.3 fL (ref 82.0–98.0)
Monocytes Absolute: 0.6 10*3/uL (ref 0.1–0.9)
Monocytes Relative: 7 %
NEUTROS PCT: 69 %
Neutro Abs: 6.3 10*3/uL (ref 1.5–6.5)
PLATELETS: 189 10*3/uL (ref 145–400)
RBC: 4.5 MIL/uL (ref 4.20–5.70)
RDW: 15.6 % (ref 11.1–15.7)
WBC: 9.2 10*3/uL (ref 4.0–10.0)

## 2017-04-02 LAB — CMP (CANCER CENTER ONLY)
ALT: 21 U/L (ref 10–47)
ANION GAP: 8 (ref 5–15)
AST: 17 U/L (ref 11–38)
Albumin: 4.1 g/dL (ref 3.5–5.0)
Alkaline Phosphatase: 122 U/L — ABNORMAL HIGH (ref 26–84)
BILIRUBIN TOTAL: 0.6 mg/dL (ref 0.2–1.6)
BUN: 3 mg/dL — ABNORMAL LOW (ref 7–22)
CO2: 24 mmol/L (ref 18–33)
Calcium: 9.2 mg/dL (ref 8.0–10.3)
Chloride: 107 mmol/L (ref 98–108)
Creatinine: 0.7 mg/dL (ref 0.60–1.20)
Glucose, Bld: 138 mg/dL — ABNORMAL HIGH (ref 73–118)
POTASSIUM: 3.4 mmol/L (ref 3.3–4.7)
Sodium: 139 mmol/L (ref 128–145)
TOTAL PROTEIN: 7.4 g/dL (ref 6.4–8.1)

## 2017-04-02 LAB — URINALYSIS, COMPLETE (UACMP) WITH MICROSCOPIC
BILIRUBIN URINE: NEGATIVE
Glucose, UA: NEGATIVE mg/dL
HGB URINE DIPSTICK: NEGATIVE
KETONES UR: NEGATIVE mg/dL
LEUKOCYTES UA: NEGATIVE
Nitrite: NEGATIVE
PH: 6.5 (ref 5.0–8.0)
Protein, ur: NEGATIVE mg/dL
Specific Gravity, Urine: 1.025 (ref 1.005–1.030)

## 2017-04-02 MED ORDER — SODIUM CHLORIDE 0.9 % IV SOLN
1000.0000 mL | Freq: Once | INTRAVENOUS | Status: AC
  Administered 2017-04-02: 1000 mL via INTRAVENOUS

## 2017-04-02 MED ORDER — ZOLEDRONIC ACID 4 MG/100ML IV SOLN
4.0000 mg | Freq: Once | INTRAVENOUS | Status: AC
  Administered 2017-04-02: 4 mg via INTRAVENOUS
  Filled 2017-04-02: qty 100

## 2017-04-02 NOTE — Patient Instructions (Signed)

## 2017-04-02 NOTE — Progress Notes (Addendum)
Hematology and Oncology Follow Up Visit  WISDOM RICKEY [id number] November 24, 1960 57 y.o. 04/02/2017   Principle Diagnosis:  Metastatic prostate cancer - androgen sensitive  Past Therapy: 14 fractions of radiation to the lumbar spine Casodex 50 mg by mouth daily - stopped 04/02/2017 due to progression   Current Therapy:   Xtandi  160 mg po q day - start on 04/06/2017 Lupron 11.5 mg IM every 3 month - next dose due May 2019 Zytiga 1000 mg by mouth daily/prednisone 10 mg by mouth daily - started on 04/20/2016 - d/c on 04/02/2017 Zometa 4 mg IV q monthly   Interim History:  Mr. Kloss is here today for follow-up. He is feeling a little fatigued. He has generalized aches and pains in the back and legs due to skeletal metastasis. He is using a cane when ambulating for support.  He verbalized that he is taking his Casodex and Zytiga daily as prescribed.  Unfortunately, his PSA continues to go up at 82.7 from 31.7. Testosterone < 3. No fever, chills, n/v, cough, rash, dizziness, chest pain, palpitations, abdominal pain or changes in bowel or bladder habits.  He is still having some constipation and states that he will try smooth move tea and see if this helps.  He has some SOB with over exertion and takes breaks to rest as needed.  No episodes of bleeding or bruising.  No lymphadenopathy found on exam.  The numbness in the great toe of his left foot is unchanged. No swelling in his extremities noted on exam.  His appetite comes and goes. He admits that he needs to hydrate better. His weight is stable at 149. He is up 6 lbs since his last visit. He will try and add Ensure once a day.  He was finally able to get dentures. He is wearing them today. He states that he is still getting used to the fit and has not eaten with them in yet.   ECOG Performance Status: 2 - Symptomatic, <50% confined to bed  Medications:  Allergies as of 04/02/2017   No Known Allergies     Medication List        Accurate  as of 04/02/17 11:01 AM. Always use your most recent med list.          bicalutamide 50 MG tablet Commonly known as:  CASODEX Take 1 tablet (50 mg total) by mouth daily.   CALCIUM-VITAMIN D3 PO Take 1 tablet by mouth daily.   diltiazem 240 MG 24 hr capsule Commonly known as:  CARTIA XT TAKE 1 CAPSULE BY MOUTH EVERY DAY   CARTIA XT 240 MG 24 hr capsule Generic drug:  diltiazem TAKE 1 CAPSULE BY MOUTH EVERY DAY   emollient cream Commonly known as:  BIAFINE Apply 1 application topically as needed (for back).   ENSURE ORIGINAL Liqd Take 1 Can by mouth 4 (four) times daily.   fentaNYL 100 MCG/HR Commonly known as:  DURAGESIC Place 1 patch (100 mcg total) onto the skin every 3 (three) days.   loperamide 2 MG capsule Commonly known as:  IMODIUM Take 2 mg by mouth as needed for diarrhea or loose stools.   megestrol 625 MG/5ML suspension Commonly known as:  MEGACE ES Take 5 mLs (625 mg total) by mouth 2 (two) times daily.   metoprolol tartrate 25 MG tablet Commonly known as:  LOPRESSOR Take 0.5 tablets (12.5 mg total) by mouth 2 (two) times daily.   ondansetron 4 MG disintegrating tablet Commonly known as:  ZOFRAN ODT Take 1 tablet (4 mg total) by mouth every 8 (eight) hours as needed.   ondansetron 4 MG disintegrating tablet Commonly known as:  ZOFRAN ODT Take 1 tablet (4 mg total) by mouth every 8 (eight) hours as needed for nausea or vomiting.   oxyCODONE-acetaminophen 10-325 MG tablet Commonly known as:  PERCOCET Take 1 tablet by mouth every 6 (six) hours as needed for pain.   sulfamethoxazole-trimethoprim 800-160 MG tablet Commonly known as:  BACTRIM DS,SEPTRA DS Take 1 tablet by mouth 2 (two) times daily.   ZYTIGA 250 MG tablet Generic drug:  abiraterone acetate TAKE 4 TABLETS BY MOUTH EVERY DAY ON AN EMPTY STOMACH EITHER 1 HOUR BEFORE OR 2 HOURS AFTER A MEAL       Allergies: No Known Allergies  Past Medical History, Surgical history, Social history, and  Family History were reviewed and updated.  Review of Systems: All other 10 point review of systems is negative.   Physical Exam:  vitals were not taken for this visit.   Wt Readings from Last 3 Encounters:  03/05/17 143 lb (64.9 kg)  02/23/17 152 lb (68.9 kg)  02/04/17 152 lb (68.9 kg)    Ocular: Sclerae unicteric, pupils equal, round and reactive to light Ear-nose-throat: Oropharynx clear, dentition fair Lymphatic: No cervical, supraclavicular or axillary adenopathy Lungs no rales or rhonchi, good excursion bilaterally Heart regular rate and rhythm, no murmur appreciated Abd soft, nontender, positive bowel sounds, no liver or spleen tip palpated on exam, no fluid wave MSK aches and pains in the back and both lower extremities due to metastatic skeletal disease, no joint edema Neuro: non-focal, well-oriented, appropriate affect Breasts: Deferred   Lab Results  Component Value Date   WBC 6.8 03/05/2017   HGB 13.6 02/23/2017   HCT 41.6 03/05/2017   MCV 84.7 03/05/2017   PLT 196 03/05/2017   Lab Results  Component Value Date   FERRITIN 216 02/04/2017   IRON 82 02/04/2017   TIBC 223 02/04/2017   UIBC 140 02/04/2017   IRONPCTSAT 37 (L) 02/04/2017   Lab Results  Component Value Date   RBC 4.91 03/05/2017   Lab Results  Component Value Date   KAPLAMBRATIO 0.89 03/28/2016   Lab Results  Component Value Date   IGGSERUM 1,066 03/28/2016   IGMSERUM 106 03/28/2016   No results found for: Odetta Pink, SPEI   Chemistry      Component Value Date/Time   NA 141 03/05/2017 1007   NA 143 12/16/2016 1022   K 3.1 (L) 03/05/2017 1007   K 3.5 12/16/2016 1022   CL 111 (H) 03/05/2017 1007   CL 101 12/16/2016 1022   CO2 19 03/05/2017 1007   CO2 33 12/16/2016 1022   BUN 5 (L) 03/05/2017 1007   BUN 4 (L) 12/16/2016 1022   CREATININE 0.60 (L) 03/05/2017 1007   CREATININE 0.8 12/16/2016 1022      Component Value Date/Time     CALCIUM 9.5 03/05/2017 1007   CALCIUM 9.4 12/16/2016 1022   ALKPHOS 98 (H) 03/05/2017 1007   ALKPHOS 77 12/16/2016 1022   AST 16 03/05/2017 1007   ALT 12 03/05/2017 1007   ALT 16 12/16/2016 1022   BILITOT 1.3 (H) 03/05/2017 1007      Impression and Plan: Mr. Chiang is a pleasant 57 yo African American gentleman with metastatic prostate cancer. Unfortunately his PSA continues to go up indicating progression.  Dr. Marin Olp was able to come in and  speak with him about changing treatment. He does not want to do IV chemo. He was agreeable to switching to [place]. Dr. Marin Olp will place this order.  He will get fluids as well as Zometa today. Lupron is due again in May.   We will plan to see him back in another 4 weeks for follow-up.  He will contact our office with any questions or concerns. We can certainly see her sooner if need be.   Laverna Peace, NP 3/7/201911:01 AM    ADDENDUM: I saw and examined the patient with Judson Roch.  I agree with the above assessment.  I had a long talk with Mr. Meckes.  I think the problem with him is that we just do not know if he is taking his oral medications.  We know that he gets his Lupron to castrate him.  His PSA has gone up significantly.  I talked him about this.  I know that the scan that we did on him did not show any new areas of disease.  He still has bone only disease.  I talked him about chemotherapy.  He does not want chemotherapy.  Apparently, his sister but he said died because of chemotherapy.  He was not sure what kind of cancer she had.  Since he is not going to take chemotherapy, we will try him on Xtandi.  I think this would be reasonable.  I went over the side effects of Xtandi.  I think he could tolerate this.  Lattie Haw, MD

## 2017-04-03 ENCOUNTER — Telehealth: Payer: Self-pay | Admitting: Pharmacist

## 2017-04-03 ENCOUNTER — Telehealth: Payer: Self-pay | Admitting: Hematology & Oncology

## 2017-04-03 DIAGNOSIS — C61 Malignant neoplasm of prostate: Secondary | ICD-10-CM

## 2017-04-03 LAB — PSA, TOTAL AND FREE
PSA, Free Pct: 8.8 %
PSA, Free: 8.81 ng/mL
Prostate Specific Ag, Serum: 99.7 ng/mL — ABNORMAL HIGH (ref 0.0–4.0)

## 2017-04-03 LAB — URINE CULTURE: CULTURE: NO GROWTH

## 2017-04-03 LAB — TESTOSTERONE: Testosterone: <3 ng/dL — ABNORMAL LOW (ref 264–916)

## 2017-04-03 MED ORDER — ENZALUTAMIDE 40 MG PO CAPS: 160.0000 mg | ORAL_CAPSULE | Freq: Every day | ORAL | 4 refills | Status: DC

## 2017-04-03 NOTE — Addendum Note (Signed)
Addended by: Burney Gauze R on: 04/03/2017 12:15 PM   Modules accepted: Orders

## 2017-04-03 NOTE — Telephone Encounter (Signed)
Oral Oncology Patient Advocate Encounter  Prior Authorization for Gillermina Phy has been approved.    PA# [id number] Effective dates: 04/03/2017 through 04/02/2018  Oral Oncology Clinic will continue to follow.    [place] Patient Advocate [phone] 04/03/2017 4:03 PM

## 2017-04-03 NOTE — Telephone Encounter (Signed)
Oral Oncology Pharmacist Encounter  Received new prescription for Xtandi (enzalutamide) for the treatment of metastatic prostate cancer, planned duration until disease progression or unacceptable drug toxicity.  BP from 04/02/17 appt assessed, BP wnl. Prescription dose and frequency assessed.   Current medication list in Epic reviewed, a few DDIs with [facility]erm[facility] Phy identified: -Xtandi may decrease the concentration of Mr. Frick diltiazem, fentanyl, ondansetron, oxycodone. Patient should be monitored for decreased effectiveness of the previously listed medication.   Due to insurance requirement, patient's prescription will be directed to [facility].  Oral Oncology Clinic will continue to follow for insurance authorization, copayment issues, initial counseling and start date.  Darl Pikes, PharmD, BCPS Hematology/Oncology Clinical Pharmacist [facility] [phone]  04/03/2017 2:01 PM

## 2017-04-03 NOTE — Telephone Encounter (Signed)
Oral Oncology Patient Advocate Encounter  Oral Oncology Patient Advocate Encounter  Received notification from [facility] that prior authorization for Ryan Davis is required.  PA submitted on CoverMyMeds Key [id number] Status is pending  Oral Oncology Clinic will continue to follow.   [place] Patient Advocate [phone] 04/03/2017 12:27 PM

## 2017-04-06 ENCOUNTER — Telehealth: Payer: Self-pay | Admitting: Hematology & Oncology

## 2017-04-06 NOTE — Telephone Encounter (Signed)
Oral Oncology Patient Advocate Encounter  Called [facility] bout patients Ryan Davis being shipped. They are trying to get in touch with patient to ship out his medication. Left a message for patient to call me.    [place] Patient Advocate [phone] 04/06/2017 8:48 AM

## 2017-04-07 ENCOUNTER — Other Ambulatory Visit: Payer: Self-pay

## 2017-04-07 ENCOUNTER — Telehealth: Payer: Self-pay | Admitting: Hematology & Oncology

## 2017-04-07 DIAGNOSIS — C61 Malignant neoplasm of prostate: Secondary | ICD-10-CM

## 2017-04-07 DIAGNOSIS — M5442 Lumbago with sciatica, left side: Secondary | ICD-10-CM

## 2017-04-07 DIAGNOSIS — G8929 Other chronic pain: Secondary | ICD-10-CM

## 2017-04-07 DIAGNOSIS — C7951 Secondary malignant neoplasm of bone: Principal | ICD-10-CM

## 2017-04-07 DIAGNOSIS — M5441 Lumbago with sciatica, right side: Secondary | ICD-10-CM

## 2017-04-07 MED ORDER — FENTANYL 100 MCG/HR TD PT72: 100.0000 ug | MEDICATED_PATCH | TRANSDERMAL | 0 refills | Status: DC

## 2017-04-07 NOTE — Telephone Encounter (Signed)
Faxed medical record to: [facility] [place] [facility] F: [phone]  Last ovn

## 2017-04-08 ENCOUNTER — Telehealth: Payer: Self-pay | Admitting: Hematology & Oncology

## 2017-04-08 NOTE — Telephone Encounter (Signed)
Faxed medical record to: [facility] [place] [facility] F: [phone]  Last ovn

## 2017-04-08 NOTE — Telephone Encounter (Signed)
Oral Oncology Patient Advocate Encounter  Spoke with [facility] to see if patients Ryan Davis had been shipped. The Ryan Davis will be shipped today to arrive Thursday.    [place] Patient Advocate [phone] 04/08/2017 2:59 PM

## 2017-04-08 NOTE — Telephone Encounter (Addendum)
Oral Oncology Patient Advocate Encounter  Spoke with patient to let him know [facility] has been trying to get in touch with him to ship his medication. He said he has not been answering there call. He will accept their call now.  [place] Patient Advocate [phone] 04/08/2017 7:53 AM

## 2017-04-09 NOTE — Telephone Encounter (Signed)
Oral Chemotherapy Pharmacist Encounter  Patient Education I spoke with patient for overview of new oral chemotherapy medication: Xtandi (enzalutamide) for the treatment of metastatic prostate cancer, planned duration until disease progression or unacceptable drug toxicity.   Pt is doing well. Counseled patient on administration, dosing, side effects, monitoring, drug-food interactions, safe handling, storage, and disposal. Patient will take 4 capsules (160 mg total) by mouth daily.   Patient received his medication in the mail and plans to get started today.   Side effects include but not limited to: fatigue, HA, HTN, hot flashes.    Reviewed with patient importance of keeping a medication schedule and plan for any missed doses.  Ryan Davis voiced understanding and appreciation. All questions answered. Medication handout was placed in the mail today.  Provided patient with Oral [place] [place] [facility] phone number. Patient knows to call the office with questions or concerns. Oral Chemotherapy Navigation Clinic will continue to follow.  Darl Pikes, PharmD, BCPS Hematology/Oncology Clinical Pharmacist [facility] [place] [facility] [phone]  04/09/2017 12:23 PM

## 2017-04-13 ENCOUNTER — Other Ambulatory Visit: Payer: Self-pay | Admitting: Hematology & Oncology

## 2017-04-15 ENCOUNTER — Other Ambulatory Visit: Payer: Self-pay | Admitting: *Deleted

## 2017-04-15 DIAGNOSIS — C61 Malignant neoplasm of prostate: Secondary | ICD-10-CM

## 2017-04-15 DIAGNOSIS — R52 Pain, unspecified: Secondary | ICD-10-CM

## 2017-04-15 MED ORDER — OXYCODONE-ACETAMINOPHEN 10-325 MG PO TABS: 1.0000 | ORAL_TABLET | Freq: Four times a day (QID) | ORAL | 0 refills | Status: DC | PRN

## 2017-04-30 ENCOUNTER — Encounter: Payer: Self-pay | Admitting: Family

## 2017-04-30 ENCOUNTER — Other Ambulatory Visit: Payer: Self-pay

## 2017-04-30 ENCOUNTER — Telehealth: Payer: Self-pay | Admitting: Hematology & Oncology

## 2017-04-30 ENCOUNTER — Inpatient Hospital Stay: Payer: BLUE CROSS/BLUE SHIELD

## 2017-04-30 ENCOUNTER — Inpatient Hospital Stay: Payer: BLUE CROSS/BLUE SHIELD | Attending: Hematology & Oncology | Admitting: Family

## 2017-04-30 VITALS — BP 120/86 | HR 90 | Temp 98.2°F | Resp 19 | Wt 144.0 lb

## 2017-04-30 DIAGNOSIS — C61 Malignant neoplasm of prostate: Secondary | ICD-10-CM

## 2017-04-30 DIAGNOSIS — C7951 Secondary malignant neoplasm of bone: Secondary | ICD-10-CM

## 2017-04-30 DIAGNOSIS — Z79899 Other long term (current) drug therapy: Secondary | ICD-10-CM | POA: Insufficient documentation

## 2017-04-30 DIAGNOSIS — E291 Testicular hypofunction: Secondary | ICD-10-CM

## 2017-04-30 DIAGNOSIS — R0602 Shortness of breath: Secondary | ICD-10-CM | POA: Diagnosis not present

## 2017-04-30 LAB — CMP (CANCER CENTER ONLY)
ALBUMIN: 4.3 g/dL (ref 3.5–5.0)
ALT: 16 U/L (ref 10–47)
AST: 18 U/L (ref 11–38)
Alkaline Phosphatase: 130 U/L — ABNORMAL HIGH (ref 26–84)
Anion gap: 10 (ref 5–15)
BUN: 7 mg/dL (ref 7–22)
CHLORIDE: 106 mmol/L (ref 98–108)
CO2: 27 mmol/L (ref 18–33)
CREATININE: 0.7 mg/dL (ref 0.60–1.20)
Calcium: 9.7 mg/dL (ref 8.0–10.3)
Glucose, Bld: 104 mg/dL (ref 73–118)
POTASSIUM: 3.7 mmol/L (ref 3.3–4.7)
Sodium: 143 mmol/L (ref 128–145)
Total Bilirubin: 0.8 mg/dL (ref 0.2–1.6)
Total Protein: 8.2 g/dL — ABNORMAL HIGH (ref 6.4–8.1)

## 2017-04-30 LAB — CBC WITH DIFFERENTIAL (CANCER CENTER ONLY)
BASOS PCT: 0 %
Basophils Absolute: 0 10*3/uL (ref 0.0–0.1)
EOS ABS: 0.3 10*3/uL (ref 0.0–0.5)
EOS PCT: 3 %
HCT: 42.1 % (ref 38.7–49.9)
Hemoglobin: 14.7 g/dL (ref 13.0–17.1)
Lymphocytes Relative: 19 %
Lymphs Abs: 1.8 10*3/uL (ref 0.9–3.3)
MCH: 30.8 pg (ref 28.0–33.4)
MCHC: 34.9 g/dL (ref 32.0–35.9)
MCV: 88.1 fL (ref 82.0–98.0)
MONOS PCT: 8 %
Monocytes Absolute: 0.8 10*3/uL (ref 0.1–0.9)
NEUTROS PCT: 70 %
Neutro Abs: 6.5 10*3/uL (ref 1.5–6.5)
PLATELETS: 192 10*3/uL (ref 145–400)
RBC: 4.78 MIL/uL (ref 4.20–5.70)
RDW: 16.5 % — ABNORMAL HIGH (ref 11.1–15.7)
WBC Count: 9.3 10*3/uL (ref 4.0–10.0)

## 2017-04-30 MED ORDER — ZOLEDRONIC ACID 4 MG/100ML IV SOLN
4.0000 mg | Freq: Once | INTRAVENOUS | Status: AC
  Administered 2017-04-30: 4 mg via INTRAVENOUS
  Filled 2017-04-30: qty 100

## 2017-04-30 MED ORDER — SODIUM CHLORIDE 0.9 % IV SOLN
INTRAVENOUS | Status: DC
Start: 2017-04-30 — End: 2017-04-30
  Administered 2017-04-30: 12:00:00 via INTRAVENOUS

## 2017-04-30 NOTE — Telephone Encounter (Signed)
Faxed medical record to: [facility] [place] D[facility] F: [phone]    Last ovn

## 2017-04-30 NOTE — Progress Notes (Signed)
Hematology and Oncology Follow Up Visit  Ryan Davis [id number] 1960/08/31 57 y.o. 04/30/2017   Principle Diagnosis:  Metastatic prostate cancer - androgen sensitive  Past Therapy: 14 fractions of radiation to the lumbar spine Casodex 50 mg by mouth daily - stopped 04/02/2017 due to progression  Zytiga 1000 mg by mouth daily/prednisone 10 mg by mouth daily - started on 04/20/2016 - d/c on 04/02/2017  Current Therapy:   Xtandi 160 mg po q day - start on 04/06/2017 Lupron 11.5 mg IM every 3 month - next dosedue May 2019 Xtandi 160 mg PO daily Zometa 4 mg IV q monthly   Interim History:  Ryan Davis is here today for follow-up and Zometa infusion. He is tolerating Xtandi well so far. He had some nausea and dry heaving the first few days after starting but this resolved.  PSA is pending. So far Ryan testosterone level has remained down at < 3.  He has had hot flashes and sweats off and on. He states that these are tolerable for now.  He has SOB with over exertion and will take breaks to rest or nap as needed. He still has some fatigue.  He spends a lot of time and energy caring for Ryan Davis.  He has had no issue with infection. No fever, chills, cough, rash, dizziness, chest pain, palpitations, abdominal pain or changes in bowel or bladder habits.  No swelling in Ryan extremities. He states that Ryan back and leg pain seem a little better. The numbness and tingling in Ryan left foot is unchanged.  He uses Ryan cane when ambulating for support. He denies having had any falls or syncopal episodes.  No lymphadenopathy found on exam.  No episodes of bleeding, no bruising or petechiae.  Ryan appetite comes and goes. He is drinking 3 Ensure a day and Ryan weight is up 1 lb. He states that he is doing Ryan best to stay well hydrated with water.   ECOG Performance Status: 1 - Symptomatic but completely ambulatory  Medications:  Allergies as of 04/30/2017   No Known Allergies     Medication List          Accurate as of 04/30/17 10:47 AM. Always use your most recent med list.          bicalutamide 50 MG tablet Commonly known as:  CASODEX Take 1 tablet (50 mg total) by mouth daily.   CALCIUM-VITAMIN D3 PO Take 1 tablet by mouth daily.   diltiazem 240 MG 24 hr capsule Commonly known as:  CARTIA XT TAKE 1 CAPSULE BY MOUTH EVERY DAY   CARTIA XT 240 MG 24 hr capsule Generic drug:  diltiazem TAKE 1 CAPSULE BY MOUTH EVERY DAY   emollient cream Commonly known as:  BIAFINE Apply 1 application topically as needed (for back).   ENSURE ORIGINAL Liqd Take 1 Can by mouth 4 (four) times daily.   enzalutamide 40 MG capsule Commonly known as:  XTANDI Take 4 capsules (160 mg total) by mouth daily.   fentaNYL 100 MCG/HR Commonly known as:  DURAGESIC - dosed mcg/hr Place 1 patch (100 mcg total) onto the skin every 3 (three) days.   loperamide 2 MG capsule Commonly known as:  IMODIUM Take 2 mg by mouth as needed for diarrhea or loose stools.   megestrol 625 MG/5ML suspension Commonly known as:  MEGACE ES Take 5 mLs (625 mg total) by mouth 2 (two) times daily.   metoprolol tartrate 25 MG tablet Commonly known as:  LOPRESSOR Take 0.5 tablets (12.5 mg total) by mouth 2 (two) times daily.   ondansetron 4 MG disintegrating tablet Commonly known as:  ZOFRAN ODT Take 1 tablet (4 mg total) by mouth every 8 (eight) hours as needed.   ondansetron 4 MG disintegrating tablet Commonly known as:  ZOFRAN ODT Take 1 tablet (4 mg total) by mouth every 8 (eight) hours as needed for nausea or vomiting.   oxyCODONE-acetaminophen 10-325 MG tablet Commonly known as:  PERCOCET Take 1 tablet by mouth every 6 (six) hours as needed for pain.       Allergies: No Known Allergies  Past Medical History, Surgical history, Social history, and Family History were reviewed and updated.  Review of Systems: All other 10 point review of systems is negative.   Physical Exam:  vitals were not taken  for this visit.   Wt Readings from Last 3 Encounters:  04/02/17 149 lb (67.6 kg)  03/05/17 143 lb (64.9 kg)  02/23/17 152 lb (68.9 kg)    Ocular: Sclerae unicteric, pupils equal, round and reactive to light Ear-nose-throat: Oropharynx clear, dentition fair Lymphatic: No cervical, supraclavicular or axillary adenopathy Lungs no rales or rhonchi, good excursion bilaterally Heart regular rate and rhythm, no murmur appreciated Abd soft, nontender, positive bowel sounds, no liver or spleen tip palpated on exam, no fluid wave  MSK no focal spinal tenderness, no joint edema Neuro: non-focal, well-oriented, appropriate affect Breasts: Deferred   Lab Results  Component Value Date   WBC 9.2 04/02/2017   HGB 13.6 02/23/2017   HCT 39.3 04/02/2017   MCV 87.3 04/02/2017   PLT 189 04/02/2017   Lab Results  Component Value Date   FERRITIN 216 02/04/2017   IRON 82 02/04/2017   TIBC 223 02/04/2017   UIBC 140 02/04/2017   IRONPCTSAT 37 (L) 02/04/2017   Lab Results  Component Value Date   RBC 4.50 04/02/2017   Lab Results  Component Value Date   KAPLAMBRATIO 0.89 03/28/2016   Lab Results  Component Value Date   IGGSERUM 1,066 03/28/2016   IGMSERUM 106 03/28/2016   No results found for: Odetta Pink, SPEI   Chemistry      Component Value Date/Time   NA 139 04/02/2017 1046   NA 143 12/16/2016 1022   K 3.4 04/02/2017 1046   K 3.5 12/16/2016 1022   CL 107 04/02/2017 1046   CL 101 12/16/2016 1022   CO2 24 04/02/2017 1046   CO2 33 12/16/2016 1022   BUN 3 (L) 04/02/2017 1046   BUN 4 (L) 12/16/2016 1022   CREATININE 0.70 04/02/2017 1046   CREATININE 0.8 12/16/2016 1022      Component Value Date/Time   CALCIUM 9.2 04/02/2017 1046   CALCIUM 9.4 12/16/2016 1022   ALKPHOS 122 (H) 04/02/2017 1046   ALKPHOS 77 12/16/2016 1022   AST 17 04/02/2017 1046   ALT 21 04/02/2017 1046   ALT 16 12/16/2016 1022   BILITOT 0.6 04/02/2017  1046      Impression and Plan: Ryan Davis is a very pleasant 57 yo African American gentleman with metastatic prostate cancer. He has tolerated the changes to [facility] nicely with few tolerable side effects. Her will continue this regimen.  PSA and testosterone levels are pending.  He received Zometa today as planned. Lupron is due again with next visit in May.  We will plan to see him back in another month for follow-up.  He will contact our office with any  questions or concerns. We can ceratinly see him sooner if need be.   Laverna Peace, NP 4/4/201910:47 AM

## 2017-05-01 LAB — TESTOSTERONE

## 2017-05-01 LAB — PROSTATE-SPECIFIC AG, SERUM (LABCORP): Prostate Specific Ag, Serum: 239.9 ng/mL — ABNORMAL HIGH (ref 0.0–4.0)

## 2017-05-06 NOTE — Telephone Encounter (Signed)
Entered in error

## 2017-05-12 ENCOUNTER — Other Ambulatory Visit: Payer: Self-pay | Admitting: Hematology & Oncology

## 2017-05-14 ENCOUNTER — Other Ambulatory Visit: Payer: Self-pay | Admitting: *Deleted

## 2017-05-14 DIAGNOSIS — C61 Malignant neoplasm of prostate: Secondary | ICD-10-CM

## 2017-05-14 DIAGNOSIS — R52 Pain, unspecified: Secondary | ICD-10-CM

## 2017-05-14 MED ORDER — OXYCODONE-ACETAMINOPHEN 10-325 MG PO TABS: 1.0000 | ORAL_TABLET | Freq: Four times a day (QID) | ORAL | 0 refills | Status: DC | PRN

## 2017-05-22 ENCOUNTER — Other Ambulatory Visit: Payer: Self-pay | Admitting: *Deleted

## 2017-05-22 DIAGNOSIS — C61 Malignant neoplasm of prostate: Secondary | ICD-10-CM

## 2017-05-22 DIAGNOSIS — G8929 Other chronic pain: Secondary | ICD-10-CM

## 2017-05-22 DIAGNOSIS — M5441 Lumbago with sciatica, right side: Secondary | ICD-10-CM

## 2017-05-22 DIAGNOSIS — C7951 Secondary malignant neoplasm of bone: Principal | ICD-10-CM

## 2017-05-22 DIAGNOSIS — M5442 Lumbago with sciatica, left side: Secondary | ICD-10-CM

## 2017-05-22 MED ORDER — FENTANYL 100 MCG/HR TD PT72: 100.0000 ug | MEDICATED_PATCH | TRANSDERMAL | 0 refills | Status: DC

## 2017-05-28 ENCOUNTER — Telehealth: Payer: Self-pay | Admitting: Hematology & Oncology

## 2017-05-28 ENCOUNTER — Inpatient Hospital Stay: Payer: Medicaid Other

## 2017-05-28 ENCOUNTER — Inpatient Hospital Stay: Payer: Medicaid Other | Attending: Hematology & Oncology | Admitting: Family

## 2017-05-28 ENCOUNTER — Other Ambulatory Visit: Payer: Self-pay

## 2017-05-28 VITALS — BP 126/73 | HR 70 | Temp 98.0°F | Resp 20 | Wt 136.0 lb

## 2017-05-28 DIAGNOSIS — C7951 Secondary malignant neoplasm of bone: Secondary | ICD-10-CM | POA: Diagnosis not present

## 2017-05-28 DIAGNOSIS — R5383 Other fatigue: Secondary | ICD-10-CM | POA: Diagnosis not present

## 2017-05-28 DIAGNOSIS — R634 Abnormal weight loss: Secondary | ICD-10-CM | POA: Diagnosis not present

## 2017-05-28 DIAGNOSIS — C61 Malignant neoplasm of prostate: Secondary | ICD-10-CM

## 2017-05-28 DIAGNOSIS — R232 Flushing: Secondary | ICD-10-CM | POA: Diagnosis not present

## 2017-05-28 DIAGNOSIS — Z5111 Encounter for antineoplastic chemotherapy: Secondary | ICD-10-CM | POA: Diagnosis not present

## 2017-05-28 DIAGNOSIS — Z923 Personal history of irradiation: Secondary | ICD-10-CM | POA: Insufficient documentation

## 2017-05-28 DIAGNOSIS — R630 Anorexia: Secondary | ICD-10-CM

## 2017-05-28 DIAGNOSIS — D508 Other iron deficiency anemias: Secondary | ICD-10-CM

## 2017-05-28 LAB — CBC WITH DIFFERENTIAL (CANCER CENTER ONLY)
Basophils Absolute: 0 10*3/uL (ref 0.0–0.1)
Basophils Relative: 0 %
EOS PCT: 4 %
Eosinophils Absolute: 0.5 10*3/uL (ref 0.0–0.5)
HEMATOCRIT: 41.6 % (ref 38.7–49.9)
Hemoglobin: 14.2 g/dL (ref 13.0–17.1)
LYMPHS ABS: 2.1 10*3/uL (ref 0.9–3.3)
LYMPHS PCT: 19 %
MCH: 30.9 pg (ref 28.0–33.4)
MCHC: 34.1 g/dL (ref 32.0–35.9)
MCV: 90.6 fL (ref 82.0–98.0)
MONO ABS: 1.2 10*3/uL — AB (ref 0.1–0.9)
Monocytes Relative: 11 %
Neutro Abs: 7 10*3/uL — ABNORMAL HIGH (ref 1.5–6.5)
Neutrophils Relative %: 66 %
PLATELETS: 170 10*3/uL (ref 145–400)
RBC: 4.59 MIL/uL (ref 4.20–5.70)
RDW: 14.7 % (ref 11.1–15.7)
WBC Count: 10.7 10*3/uL — ABNORMAL HIGH (ref 4.0–10.0)

## 2017-05-28 LAB — CMP (CANCER CENTER ONLY)
ALK PHOS: 100 U/L — AB (ref 26–84)
ALT: 19 U/L (ref 10–47)
ANION GAP: 4 — AB (ref 5–15)
AST: 18 U/L (ref 11–38)
Albumin: 3.9 g/dL (ref 3.5–5.0)
BILIRUBIN TOTAL: 0.7 mg/dL (ref 0.2–1.6)
BUN: 9 mg/dL (ref 7–22)
CALCIUM: 9.2 mg/dL (ref 8.0–10.3)
CO2: 25 mmol/L (ref 18–33)
Chloride: 106 mmol/L (ref 98–108)
Creatinine: 0.8 mg/dL (ref 0.60–1.20)
GLUCOSE: 117 mg/dL (ref 73–118)
Potassium: 3.3 mmol/L (ref 3.3–4.7)
Sodium: 135 mmol/L (ref 128–145)
TOTAL PROTEIN: 7.5 g/dL (ref 6.4–8.1)

## 2017-05-28 MED ORDER — ZOLEDRONIC ACID 4 MG/100ML IV SOLN
4.0000 mg | Freq: Once | INTRAVENOUS | Status: AC
  Administered 2017-05-28: 4 mg via INTRAVENOUS
  Filled 2017-05-28: qty 100

## 2017-05-28 MED ORDER — MEGESTROL ACETATE 625 MG/5ML PO SUSP: 625.0000 mg | Freq: Two times a day (BID) | ORAL | 4 refills | Status: DC

## 2017-05-28 NOTE — Progress Notes (Signed)
Hematology and Oncology Follow Up Visit  Ryan Davis [id number] 09/12/1960 57 y.o. 05/28/2017   Principle Diagnosis:  Metastatic prostate cancer - androgen sensitive  Past Therapy: 14 fractions of radiation to the lumbar spine Casodex 50 mg by mouth daily- stopped 04/02/2017 due to progression Zytiga 1000 mg by mouth daily/prednisone 10 mg by mouth daily - started on 04/20/2016- d/c on 04/02/2017  Current Therapy:   Xtandi160 mg po q day - started on 04/06/2017 Lupron 11.5 mg IM every 3 month - next dosedue May 2019 Zometa 4 mg IV q monthly   Interim History:  Ryan Davis is here today for follow-up and treatment with Zometa and Lupron. He is feeling fatigued and is still having hot flashes and night sweats. He has not been taking his megace and needs a refill.  PSA last month was up to 239, testosterone < 3. He started his Xtandi last month after his visit and verbalized that he is taking as prescribed. We gave him the number for [facility] so he could call when he is ready for refill.  His weight is down 8 lbs this visit. He has a hard time affording Ensure and his appetite has been down. He is hydrating well.  No lymphadenopathy noted on exam.  No episodes of bleeding, no bruising or petechiae.  No fever, chills, n/v, cough, rash, dizziness, SOB, chest pain, palpitations, abdominal pain or changes in bowel or bladder habits.  No swelling in his extremities. The numbness and tingling he has had in his feet seems to be better.  He states that he has some bone pain but this is managed effectively with his current pain medication regimen.   ECOG Performance Status: 1 - Symptomatic but completely ambulatory  Medications:  Allergies as of 05/28/2017   No Known Allergies     Medication List        Accurate as of 05/28/17 11:33 AM. Always use your most recent med list.          bicalutamide 50 MG tablet Commonly known as:  CASODEX Take 1 tablet (50 mg total) by mouth daily.   CALCIUM-VITAMIN D3 PO Take 1 tablet by mouth daily.   diltiazem 240 MG 24 hr capsule Commonly known as:  CARDIZEM CD TAKE 1 CAPSULE BY MOUTH EVERY DAY   enzalutamide 40 MG capsule Commonly known as:  XTANDI Take 4 capsules (160 mg total) by mouth daily.   fentaNYL 100 MCG/HR Commonly known as:  DURAGESIC - dosed mcg/hr Place 1 patch (100 mcg total) onto the skin every 3 (three) days.   metoprolol tartrate 25 MG tablet Commonly known as:  LOPRESSOR Take 0.5 tablets (12.5 mg total) by mouth 2 (two) times daily.   ondansetron 4 MG disintegrating tablet Commonly known as:  ZOFRAN ODT Take 1 tablet (4 mg total) by mouth every 8 (eight) hours as needed for nausea or vomiting.   oxyCODONE-acetaminophen 10-325 MG tablet Commonly known as:  PERCOCET Take 1 tablet by mouth every 6 (six) hours as needed for pain.       Allergies: No Known Allergies  Past Medical History, Surgical history, Social history, and Family History were reviewed and updated.  Review of Systems: All other 10 point review of systems is negative.   Physical Exam:  vitals were not taken for this visit.   Wt Readings from Last 3 Encounters:  04/30/17 144 lb (65.3 kg)  04/02/17 149 lb (67.6 kg)  03/05/17 143 lb (64.9 kg)    Ocular:  Sclerae unicteric, pupils equal, round and reactive to light Ear-nose-throat: Oropharynx clear, dentition fair Lymphatic: No cervical, supraclavicular or axillary adenopathy Lungs no rales or rhonchi, good excursion bilaterally Heart regular rate and rhythm, no murmur appreciated Abd soft, nontender, positive bowel sounds, no liver or spleen tip palpated on exam, no fluid wave  MSK no focal spinal tenderness, no joint edema Neuro: non-focal, well-oriented, appropriate affect Breasts: Deferred   Lab Results  Component Value Date   WBC 9.3 04/30/2017   HGB 14.7 04/30/2017   HCT 42.1 04/30/2017   MCV 88.1 04/30/2017   PLT 192 04/30/2017   Lab Results  Component Value  Date   FERRITIN 216 02/04/2017   IRON 82 02/04/2017   TIBC 223 02/04/2017   UIBC 140 02/04/2017   IRONPCTSAT 37 (L) 02/04/2017   Lab Results  Component Value Date   RBC 4.78 04/30/2017   Lab Results  Component Value Date   KAPLAMBRATIO 0.89 03/28/2016   Lab Results  Component Value Date   IGGSERUM 1,066 03/28/2016   IGMSERUM 106 03/28/2016   No results found for: Odetta Pink, SPEI   Chemistry      Component Value Date/Time   NA 143 04/30/2017 1036   NA 143 12/16/2016 1022   K 3.7 04/30/2017 1036   K 3.5 12/16/2016 1022   CL 106 04/30/2017 1036   CL 101 12/16/2016 1022   CO2 27 04/30/2017 1036   CO2 33 12/16/2016 1022   BUN 7 04/30/2017 1036   BUN 4 (L) 12/16/2016 1022   CREATININE 0.70 04/30/2017 1036   CREATININE 0.8 12/16/2016 1022      Component Value Date/Time   CALCIUM 9.7 04/30/2017 1036   CALCIUM 9.4 12/16/2016 1022   ALKPHOS 130 (H) 04/30/2017 1036   ALKPHOS 77 12/16/2016 1022   AST 18 04/30/2017 1036   ALT 16 04/30/2017 1036   ALT 16 12/16/2016 1022   BILITOT 0.8 04/30/2017 1036      Impression and Plan: Ryan Davis is a very pleasant 57 yo African American gentleman with metastatic prostate cancer. He verbalized that he is taking his Xtandi as prescribed and will call for refill.  His PSA is pending. We will see if this has gone down at all.  He received his Zometa today as planned but left prior to getting his Lupron. His testosterone has remained stable at < 3 so we will give him his injection with his next visit.  His Megace was refilled today. Hopefull this will help with his hot flashes and increase his appetite.  We will plan to see him back in another month for follow-up.  He promises to contact our office with any questions or concerns. We can certainly see him sooner if need be.   Laverna Peace, NP 5/2/201911:33 AM

## 2017-05-28 NOTE — Patient Instructions (Signed)

## 2017-05-28 NOTE — Telephone Encounter (Signed)
Faxed medical record to: [facility] F:[phone]    Last ovn

## 2017-05-29 ENCOUNTER — Other Ambulatory Visit: Payer: Self-pay | Admitting: *Deleted

## 2017-05-29 DIAGNOSIS — R630 Anorexia: Secondary | ICD-10-CM

## 2017-05-29 DIAGNOSIS — R232 Flushing: Secondary | ICD-10-CM

## 2017-05-29 DIAGNOSIS — R634 Abnormal weight loss: Secondary | ICD-10-CM

## 2017-05-29 DIAGNOSIS — C61 Malignant neoplasm of prostate: Secondary | ICD-10-CM

## 2017-05-29 LAB — TESTOSTERONE

## 2017-05-29 LAB — PROSTATE-SPECIFIC AG, SERUM (LABCORP): PROSTATE SPECIFIC AG, SERUM: 332.9 ng/mL — AB (ref 0.0–4.0)

## 2017-05-29 MED ORDER — MEGESTROL ACETATE 625 MG/5ML PO SUSP: 625.0000 mg | Freq: Two times a day (BID) | ORAL | 4 refills | Status: DC

## 2017-06-09 ENCOUNTER — Other Ambulatory Visit: Payer: Self-pay | Admitting: *Deleted

## 2017-06-09 DIAGNOSIS — C61 Malignant neoplasm of prostate: Secondary | ICD-10-CM

## 2017-06-09 MED ORDER — ENZALUTAMIDE 40 MG PO CAPS: 160.0000 mg | ORAL_CAPSULE | Freq: Every day | ORAL | 11 refills | Status: DC

## 2017-06-12 ENCOUNTER — Other Ambulatory Visit: Payer: Self-pay | Admitting: Hematology & Oncology

## 2017-06-15 ENCOUNTER — Other Ambulatory Visit: Payer: Self-pay | Admitting: *Deleted

## 2017-06-15 DIAGNOSIS — C61 Malignant neoplasm of prostate: Secondary | ICD-10-CM

## 2017-06-15 DIAGNOSIS — G8929 Other chronic pain: Secondary | ICD-10-CM

## 2017-06-15 DIAGNOSIS — M5441 Lumbago with sciatica, right side: Secondary | ICD-10-CM

## 2017-06-15 DIAGNOSIS — M5442 Lumbago with sciatica, left side: Secondary | ICD-10-CM

## 2017-06-15 DIAGNOSIS — C7951 Secondary malignant neoplasm of bone: Secondary | ICD-10-CM

## 2017-06-15 DIAGNOSIS — R52 Pain, unspecified: Secondary | ICD-10-CM

## 2017-06-15 MED ORDER — OXYCODONE-ACETAMINOPHEN 10-325 MG PO TABS: 1.0000 | ORAL_TABLET | Freq: Four times a day (QID) | ORAL | 0 refills | Status: DC | PRN

## 2017-06-15 MED ORDER — FENTANYL 100 MCG/HR TD PT72: 100.0000 ug | MEDICATED_PATCH | TRANSDERMAL | 0 refills | Status: DC

## 2017-06-24 ENCOUNTER — Inpatient Hospital Stay: Payer: Medicaid Other

## 2017-06-24 ENCOUNTER — Encounter: Payer: Self-pay | Admitting: Family

## 2017-06-24 ENCOUNTER — Telehealth: Payer: Self-pay | Admitting: Hematology & Oncology

## 2017-06-24 ENCOUNTER — Other Ambulatory Visit: Payer: Self-pay | Admitting: *Deleted

## 2017-06-24 ENCOUNTER — Other Ambulatory Visit: Payer: Self-pay

## 2017-06-24 ENCOUNTER — Inpatient Hospital Stay (HOSPITAL_BASED_OUTPATIENT_CLINIC_OR_DEPARTMENT_OTHER): Payer: Medicaid Other | Admitting: Family

## 2017-06-24 VITALS — BP 112/77 | HR 77 | Temp 97.8°F | Resp 18 | Wt 143.0 lb

## 2017-06-24 DIAGNOSIS — C7951 Secondary malignant neoplasm of bone: Secondary | ICD-10-CM

## 2017-06-24 DIAGNOSIS — C61 Malignant neoplasm of prostate: Secondary | ICD-10-CM | POA: Diagnosis not present

## 2017-06-24 DIAGNOSIS — Z7189 Other specified counseling: Secondary | ICD-10-CM

## 2017-06-24 DIAGNOSIS — D500 Iron deficiency anemia secondary to blood loss (chronic): Secondary | ICD-10-CM

## 2017-06-24 DIAGNOSIS — R972 Elevated prostate specific antigen [PSA]: Secondary | ICD-10-CM

## 2017-06-24 HISTORY — DX: Other specified counseling: Z71.89

## 2017-06-24 LAB — CBC WITH DIFFERENTIAL (CANCER CENTER ONLY)
BASOS ABS: 0 10*3/uL (ref 0.0–0.1)
BASOS PCT: 0 %
Eosinophils Absolute: 0.2 10*3/uL (ref 0.0–0.5)
Eosinophils Relative: 2 %
HEMATOCRIT: 43.2 % (ref 38.7–49.9)
Hemoglobin: 14.9 g/dL (ref 13.0–17.1)
Lymphocytes Relative: 25 %
Lymphs Abs: 2.8 10*3/uL (ref 0.9–3.3)
MCH: 31.1 pg (ref 28.0–33.4)
MCHC: 34.5 g/dL (ref 32.0–35.9)
MCV: 90.2 fL (ref 82.0–98.0)
MONO ABS: 1.1 10*3/uL — AB (ref 0.1–0.9)
MONOS PCT: 9 %
NEUTROS ABS: 7.3 10*3/uL — AB (ref 1.5–6.5)
Neutrophils Relative %: 64 %
PLATELETS: 189 10*3/uL (ref 145–400)
RBC: 4.79 MIL/uL (ref 4.20–5.70)
RDW: 14.5 % (ref 11.1–15.7)
WBC Count: 11.4 10*3/uL — ABNORMAL HIGH (ref 4.0–10.0)

## 2017-06-24 LAB — CMP (CANCER CENTER ONLY)
ALBUMIN: 4.7 g/dL (ref 3.5–5.0)
ALT: 35 U/L (ref 0–55)
ANION GAP: 13 — AB (ref 3–11)
AST: 25 U/L (ref 5–34)
Alkaline Phosphatase: 163 U/L — ABNORMAL HIGH (ref 40–150)
BILIRUBIN TOTAL: 0.4 mg/dL (ref 0.2–1.2)
BUN: 8 mg/dL (ref 7–26)
CO2: 23 mmol/L (ref 22–29)
CREATININE: 0.81 mg/dL (ref 0.70–1.30)
Calcium: 9.8 mg/dL (ref 8.4–10.4)
Chloride: 101 mmol/L (ref 98–109)
GFR, Estimated: >60 mL/min (ref >60)
GLUCOSE: 120 mg/dL (ref 70–140)
Potassium: 3.7 mmol/L (ref 3.5–5.1)
Sodium: 137 mmol/L (ref 136–145)
TOTAL PROTEIN: 8.3 g/dL (ref 6.4–8.3)

## 2017-06-24 MED ORDER — ZOLEDRONIC ACID 4 MG/100ML IV SOLN
4.0000 mg | Freq: Once | INTRAVENOUS | Status: AC
  Administered 2017-06-24: 4 mg via INTRAVENOUS
  Filled 2017-06-24: qty 100

## 2017-06-24 MED ORDER — PREDNISONE 5 MG PO TABS: 5.0000 mg | ORAL_TABLET | Freq: Two times a day (BID) | ORAL | 6 refills | Status: DC

## 2017-06-24 MED ORDER — LEUPROLIDE ACETATE (3 MONTH) 11.25 MG IM KIT
11.2500 mg | PACK | Freq: Once | INTRAMUSCULAR | Status: AC
  Administered 2017-06-24: 11.25 mg via INTRAMUSCULAR
  Filled 2017-06-24: qty 11.25

## 2017-06-24 NOTE — Progress Notes (Signed)
Hematology and Oncology Follow Up Visit  Ryan Davis [id number] Feb 20, 1960 57 y.o. 06/24/2017   Principle Diagnosis:  Metastatic prostate cancer - androgen sensitive  Past Therapy: 14 fractions of radiation to the lumbar spine Casodex 50 mg by mouth daily- stopped 04/02/2017 due to progression Zytiga 1000 mg by mouth daily/prednisone 10 mg by mouth daily - started on 04/20/2016- d/c on 04/02/2017  Current Therapy:   Xtandi160 mg po q day - started on 04/06/2017 Lupron 11.5 mg IM every 3 month - next dosedue May 2019 Zometa 4 mg IV q monthly   Interim History:  Ryan Davis is here today for follow-up. He is doing fairly well and had a wonderful time at the beach this past weekend with his wife. They really enjoyed their time together.  He is still feeling fatigued.  He states that his pain and stiffness all over comes and goes. He feels that this is managed effectively with his current pain medication regimen.  His PSA has continued to go up and earlier this month it was 332. His Testosterone has remained stable at < 3.  He verbalized that he is taking his Xtandi as prescribed.  He feels that his appetite is "picking up." He has started drinking 4 protein drinks each day as well.  No fever, chills, n/v, cough, rash, dizziness, SOB, chest pain, palpitations, abdominal pain or changes in bowel or bladder habits.  No swelling in his extremities. The numbness and tingling in is feet is unchanged.  No lymphadenopathy noted on exam.  He has had no episodes of bleeding, no bruising or petechiae.  ECOG Performance Status: 2 - Symptomatic, <50% confined to bed  Medications:  Allergies as of 06/24/2017   No Known Allergies     Medication List        Accurate as of 06/24/17 12:33 PM. Always use your most recent med list.          CALCIUM-VITAMIN D3 PO Take 1 tablet by mouth daily.   diltiazem 240 MG 24 hr capsule Commonly known as:  CARDIZEM CD TAKE 1 CAPSULE BY MOUTH EVERY  DAY   enzalutamide 40 MG capsule Commonly known as:  XTANDI Take 4 capsules (160 mg total) by mouth daily.   fentaNYL 100 MCG/HR Commonly known as:  DURAGESIC - dosed mcg/hr Place 1 patch (100 mcg total) onto the skin every 3 (three) days.   megestrol 625 MG/5ML suspension Commonly known as:  MEGACE ES Take 5 mLs (625 mg total) by mouth 2 (two) times daily.   metoprolol tartrate 25 MG tablet Commonly known as:  LOPRESSOR Take 0.5 tablets (12.5 mg total) by mouth 2 (two) times daily.   ondansetron 4 MG disintegrating tablet Commonly known as:  ZOFRAN ODT Take 1 tablet (4 mg total) by mouth every 8 (eight) hours as needed for nausea or vomiting.   oxyCODONE-acetaminophen 10-325 MG tablet Commonly known as:  PERCOCET Take 1 tablet by mouth every 6 (six) hours as needed for pain.       Allergies: No Known Allergies  Past Medical History, Surgical history, Social history, and Family History were reviewed and updated.  Review of Systems: All other 10 point review of systems is negative.   Physical Exam:  weight is 143 lb (64.9 kg). His oral temperature is 97.8 F (36.6 C). His blood pressure is 112/77 and his pulse is 77. His respiration is 18 and oxygen saturation is 100%.   Wt Readings from Last 3 Encounters:  06/24/17 143  lb (64.9 kg)  05/28/17 136 lb (61.7 kg)  04/30/17 144 lb (65.3 kg)    Ocular: Sclerae unicteric, pupils equal, round and reactive to light Ear-nose-throat: Oropharynx clear, dentition fair Lymphatic: No cervical, supraclavicular or axillary adenopathy Lungs no rales or rhonchi, good excursion bilaterally Heart regular rate and rhythm, no murmur appreciated Abd soft, nontender, positive bowel sounds, no liver or spleen tip palpated on exam, no fluid wave  MSK no focal spinal tenderness, no joint edema Neuro: non-focal, well-oriented, appropriate affect Breasts: Deferred   Lab Results  Component Value Date   WBC 11.4 (H) 06/24/2017   HGB 14.9  06/24/2017   HCT 43.2 06/24/2017   MCV 90.2 06/24/2017   PLT 189 06/24/2017   Lab Results  Component Value Date   FERRITIN 216 02/04/2017   IRON 82 02/04/2017   TIBC 223 02/04/2017   UIBC 140 02/04/2017   IRONPCTSAT 37 (L) 02/04/2017   Lab Results  Component Value Date   RBC 4.79 06/24/2017   Lab Results  Component Value Date   KAPLAMBRATIO 0.89 03/28/2016   Lab Results  Component Value Date   IGGSERUM 1,066 03/28/2016   IGMSERUM 106 03/28/2016   No results found for: Odetta Pink, SPEI   Chemistry      Component Value Date/Time   NA 135 05/28/2017 1121   NA 143 12/16/2016 1022   K 3.3 05/28/2017 1121   K 3.5 12/16/2016 1022   CL 106 05/28/2017 1121   CL 101 12/16/2016 1022   CO2 25 05/28/2017 1121   CO2 33 12/16/2016 1022   BUN 9 05/28/2017 1121   BUN 4 (L) 12/16/2016 1022   CREATININE 0.80 05/28/2017 1121   CREATININE 0.8 12/16/2016 1022      Component Value Date/Time   CALCIUM 9.2 05/28/2017 1121   CALCIUM 9.4 12/16/2016 1022   ALKPHOS 100 (H) 05/28/2017 1121   ALKPHOS 77 12/16/2016 1022   AST 18 05/28/2017 1121   ALT 19 05/28/2017 1121   ALT 16 12/16/2016 1022   BILITOT 0.7 05/28/2017 1121      Impression and Plan: Ryan Davis is a very pleasant 57 yo African American gentleman with metastatic prostate cancer. His PSA continues to go up and was 332 earlier this month.  The Gillermina Phy has not been effective so after discussing treatment options with Dr. Marin Olp he has decided to try chemotherapy with dose reduced Taxotere.  Orders placed by Dr. Marin Olp and we will get his schedule set up.  Oncotype DX prostate AR-V7 labs were drawn today.  We will plan to see him back in another 5 weeks for follow-up and to try treatment.  He will contact our office with any questions or concerns. We can certainly see him sooner if need be.   Laverna Peace, NP 5/29/201912:33 PM   ADDENDUM: I had a long talk with  Ryan Davis.  I examined him.  I agree with the above assessment by Judson Roch.  It is clear that the Gillermina Phy is not effective.  His PSA keeps going up.  At this point time, our next course of action would have to be chemotherapy.  This would be I think the best option for [place].  He has not seen chemotherapy.  I talked to him about using chemotherapy.  He is concerned about the side effects.  I told him that our goal of taking care of him is his quality of life.  He understands that what we are dealing  with is not curable.  We just want to try to decrease the tumor burden so that he will not have issues so he can help take care of his wife who is disabled.  I think that dose reduced Taxotere would be something reasonable.  I think that he would be able to tolerate this.  I went over the side effects of the Taxotere.  I told him about the fact that he will need a white cell booster shot to help minimize the risk of infection.  We will put him on low-dose prednisone to help with any toxicity with respect to fluid retention.  I told him that treatment would be 1 day every 3 weeks.  This will be dependent upon his blood counts.  He would like to try treatment.  I think this would be very reasonable.  I do not think we need a Port-A-Cath.  I spent about 40 minutes with him.  I answered all of his questions.  We will give him some information about the Taxotere.  We will try to get started with treatment in a couple of weeks.  I do not think we need any scans on him.  I would think that his PSA will tell us how he is doing.  Lattie Haw, MD

## 2017-06-24 NOTE — Telephone Encounter (Signed)
Faxed medical record to: [facility] [phone]    Last ovn

## 2017-06-24 NOTE — Progress Notes (Signed)
START ON PATHWAY REGIMEN - Prostate     A cycle is every 21 days:     Docetaxel      Prednisone   **Always confirm dose/schedule in your pharmacy ordering system**    Patient Characteristics: Adenocarcinoma, Metastatic, Castration Resistant, Symptomatic, Docetaxel Eligible Current radiographic evidence of distant metastasis<= Yes Histology: Adenocarcinoma AJCC T Category: cT4 Gleason Primary: 4 AJCC N Category: N1 Gleason Secondary: 5 AJCC M Category: M1c Gleason Score: 9 AJCC 8 Stage Grouping: IVB PSA Values (ng/mL): ? 20  Intent of Therapy: Non-Curative / Palliative Intent, Discussed with Patient

## 2017-06-24 NOTE — Patient Instructions (Signed)
Leuprolide depot injection What is this medicine? LEUPROLIDE (loo PROE lide) is a man-made protein that acts like a natural hormone in the body. It decreases testosterone in men and decreases estrogen in women. In men, this medicine is used to treat advanced prostate cancer. In women, some forms of this medicine may be used to treat endometriosis, uterine fibroids, or other male hormone-related problems. This medicine may be used for other purposes; ask your health care provider or pharmacist if you have questions. COMMON BRAND NAME(S): Eligard, Lupron Depot, Lupron Depot-Ped, Viadur What should I tell my health care provider before I take this medicine? They need to know if you have any of these conditions: -diabetes -heart disease or previous heart attack -high blood pressure -high cholesterol -mental illness -osteoporosis -pain or difficulty passing urine -seizures -spinal cord metastasis -stroke -suicidal thoughts, plans, or attempt; a previous suicide attempt by you or a family member -tobacco smoker -unusual vaginal bleeding (women) -an unusual or allergic reaction to leuprolide, benzyl alcohol, other medicines, foods, dyes, or preservatives -pregnant or trying to get pregnant -breast-feeding How should I use this medicine? This medicine is for injection into a muscle or for injection under the skin. It is given by a health care professional in a hospital or clinic setting. The specific product will determine how it will be given to you. Make sure you understand which product you receive and how often you will receive it. Talk to your pediatrician regarding the use of this medicine in children. Special care may be needed. Overdosage: If you think you have taken too much of this medicine contact a poison control center or emergency room at once. NOTE: This medicine is only for you. Do not share this medicine with others. What if I miss a dose? It is important not to miss a dose.  Call your doctor or health care professional if you are unable to keep an appointment. Depot injections: Depot injections are given either once-monthly, every 12 weeks, every 16 weeks, or every 24 weeks depending on the product you are prescribed. The product you are prescribed will be based on if you are male or male, and your condition. Make sure you understand your product and dosing. What may interact with this medicine? Do not take this medicine with any of the following medications: -chasteberry This medicine may also interact with the following medications: -herbal or dietary supplements, like black cohosh or DHEA -male hormones, like estrogens or progestins and birth control pills, patches, rings, or injections -male hormones, like testosterone This list may not describe all possible interactions. Give your health care provider a list of all the medicines, herbs, non-prescription drugs, or dietary supplements you use. Also tell them if you smoke, drink alcohol, or use illegal drugs. Some items may interact with your medicine. What should I watch for while using this medicine? Visit your doctor or health care professional for regular checks on your progress. During the first weeks of treatment, your symptoms may get worse, but then will improve as you continue your treatment. You may get hot flashes, increased bone pain, increased difficulty passing urine, or an aggravation of nerve symptoms. Discuss these effects with your doctor or health care professional, some of them may improve with continued use of this medicine. Male patients may experience a menstrual cycle or spotting during the first months of therapy with this medicine. If this continues, contact your doctor or health care professional. What side effects may I notice from receiving this medicine? Side   effects that you should report to your doctor or health care professional as soon as possible: -allergic reactions like skin  rash, itching or hives, swelling of the face, lips, or tongue -breathing problems -chest pain -depression or memory disorders -pain in your legs or groin -pain at site where injected or implanted -seizures -severe headache -swelling of the feet and legs -suicidal thoughts or other mood changes -visual changes -vomiting Side effects that usually do not require medical attention (report to your doctor or health care professional if they continue or are bothersome): -breast swelling or tenderness -decrease in sex drive or performance -diarrhea -hot flashes -loss of appetite -muscle, joint, or bone pains -nausea -redness or irritation at site where injected or implanted -skin problems or acne This list may not describe all possible side effects. Call your doctor for medical advice about side effects. You may report side effects to FDA at 1-800-FDA-1088. Where should I keep my medicine? This drug is given in a hospital or clinic and will not be stored at home. NOTE: This sheet is a summary. It may not cover all possible information. If you have questions about this medicine, talk to your doctor, pharmacist, or health care provider.  2018 Elsevier/Gold Standard (2015-06-28 09:45:53) Zoledronic Acid injection (Hypercalcemia, Oncology) What is this medicine? ZOLEDRONIC ACID (ZOE le dron ik AS id) lowers the amount of calcium loss from bone. It is used to treat too much calcium in your blood from cancer. It is also used to prevent complications of cancer that has spread to the bone. This medicine may be used for other purposes; ask your health care provider or pharmacist if you have questions. COMMON BRAND NAME(S): Zometa What should I tell my health care provider before I take this medicine? They need to know if you have any of these conditions: -aspirin-sensitive asthma -cancer, especially if you are receiving medicines used to treat cancer -dental disease or wear  dentures -infection -kidney disease -receiving corticosteroids like dexamethasone or prednisone -an unusual or allergic reaction to zoledronic acid, other medicines, foods, dyes, or preservatives -pregnant or trying to get pregnant -breast-feeding How should I use this medicine? This medicine is for infusion into a vein. It is given by a health care professional in a hospital or clinic setting. Talk to your pediatrician regarding the use of this medicine in children. Special care may be needed. Overdosage: If you think you have taken too much of this medicine contact a poison control center or emergency room at once. NOTE: This medicine is only for you. Do not share this medicine with others. What if I miss a dose? It is important not to miss your dose. Call your doctor or health care professional if you are unable to keep an appointment. What may interact with this medicine? -certain antibiotics given by injection -NSAIDs, medicines for pain and inflammation, like ibuprofen or naproxen -some diuretics like bumetanide, furosemide -teriparatide -thalidomide This list may not describe all possible interactions. Give your health care provider a list of all the medicines, herbs, non-prescription drugs, or dietary supplements you use. Also tell them if you smoke, drink alcohol, or use illegal drugs. Some items may interact with your medicine. What should I watch for while using this medicine? Visit your doctor or health care professional for regular checkups. It may be some time before you see the benefit from this medicine. Do not stop taking your medicine unless your doctor tells you to. Your doctor may order blood tests or other tests to  see how you are doing. Women should inform their doctor if they wish to become pregnant or think they might be pregnant. There is a potential for serious side effects to an unborn child. Talk to your health care professional or pharmacist for more  information. You should make sure that you get enough calcium and vitamin D while you are taking this medicine. Discuss the foods you eat and the vitamins you take with your health care professional. Some people who take this medicine have severe bone, joint, and/or muscle pain. This medicine may also increase your risk for jaw problems or a broken thigh bone. Tell your doctor right away if you have severe pain in your jaw, bones, joints, or muscles. Tell your doctor if you have any pain that does not go away or that gets worse. Tell your dentist and dental surgeon that you are taking this medicine. You should not have major dental surgery while on this medicine. See your dentist to have a dental exam and fix any dental problems before starting this medicine. Take good care of your teeth while on this medicine. Make sure you see your dentist for regular follow-up appointments. What side effects may I notice from receiving this medicine? Side effects that you should report to your doctor or health care professional as soon as possible: -allergic reactions like skin rash, itching or hives, swelling of the face, lips, or tongue -anxiety, confusion, or depression -breathing problems -changes in vision -eye pain -feeling faint or lightheaded, falls -jaw pain, especially after dental work -mouth sores -muscle cramps, stiffness, or weakness -redness, blistering, peeling or loosening of the skin, including inside the mouth -trouble passing urine or change in the amount of urine Side effects that usually do not require medical attention (report to your doctor or health care professional if they continue or are bothersome): -bone, joint, or muscle pain -constipation -diarrhea -fever -hair loss -irritation at site where injected -loss of appetite -nausea, vomiting -stomach upset -trouble sleeping -trouble swallowing -weak or tired This list may not describe all possible side effects. Call your  doctor for medical advice about side effects. You may report side effects to FDA at 1-800-FDA-1088. Where should I keep my medicine? This drug is given in a hospital or clinic and will not be stored at home. NOTE: This sheet is a summary. It may not cover all possible information. If you have questions about this medicine, talk to your doctor, pharmacist, or health care provider.  2018 Elsevier/Gold Standard (2013-06-11 14:19:39)

## 2017-06-25 LAB — TESTOSTERONE

## 2017-06-25 LAB — PSA, TOTAL AND FREE
PROSTATE SPECIFIC AG, SERUM: 518.7 ng/mL — AB (ref 0.0–4.0)
PSA, Free Pct: 5.7 %
PSA, Free: 29.33 ng/mL

## 2017-07-07 ENCOUNTER — Other Ambulatory Visit: Payer: Self-pay | Admitting: *Deleted

## 2017-07-07 DIAGNOSIS — C61 Malignant neoplasm of prostate: Secondary | ICD-10-CM

## 2017-07-08 ENCOUNTER — Other Ambulatory Visit: Payer: Self-pay | Admitting: *Deleted

## 2017-07-08 ENCOUNTER — Inpatient Hospital Stay: Payer: Medicaid Other | Attending: Hematology & Oncology

## 2017-07-08 ENCOUNTER — Inpatient Hospital Stay: Payer: Medicaid Other

## 2017-07-08 ENCOUNTER — Inpatient Hospital Stay (HOSPITAL_BASED_OUTPATIENT_CLINIC_OR_DEPARTMENT_OTHER): Payer: Medicaid Other | Admitting: Hematology & Oncology

## 2017-07-08 ENCOUNTER — Other Ambulatory Visit: Payer: Self-pay

## 2017-07-08 ENCOUNTER — Encounter: Payer: Self-pay | Admitting: Hematology & Oncology

## 2017-07-08 VITALS — BP 114/85 | HR 85 | Temp 98.1°F | Resp 18 | Wt 148.0 lb

## 2017-07-08 DIAGNOSIS — G8929 Other chronic pain: Secondary | ICD-10-CM | POA: Insufficient documentation

## 2017-07-08 DIAGNOSIS — C61 Malignant neoplasm of prostate: Secondary | ICD-10-CM | POA: Diagnosis not present

## 2017-07-08 DIAGNOSIS — M25559 Pain in unspecified hip: Secondary | ICD-10-CM | POA: Diagnosis not present

## 2017-07-08 DIAGNOSIS — M549 Dorsalgia, unspecified: Secondary | ICD-10-CM | POA: Diagnosis not present

## 2017-07-08 DIAGNOSIS — C7951 Secondary malignant neoplasm of bone: Secondary | ICD-10-CM | POA: Insufficient documentation

## 2017-07-08 DIAGNOSIS — Z923 Personal history of irradiation: Secondary | ICD-10-CM | POA: Insufficient documentation

## 2017-07-08 LAB — CMP (CANCER CENTER ONLY)
ALBUMIN: 4.3 g/dL (ref 3.5–5.0)
ALK PHOS: 163 U/L — AB (ref 40–150)
ALT: 6 U/L (ref 0–55)
ANION GAP: 9 (ref 3–11)
AST: 12 U/L (ref 5–34)
BUN: 9 mg/dL (ref 7–26)
CALCIUM: 9.4 mg/dL (ref 8.4–10.4)
CO2: 25 mmol/L (ref 22–29)
Chloride: 103 mmol/L (ref 98–109)
Creatinine: 0.68 mg/dL — ABNORMAL LOW (ref 0.70–1.30)
GFR, Estimated: >60 mL/min (ref >60)
Glucose, Bld: 106 mg/dL (ref 70–140)
POTASSIUM: 3.7 mmol/L (ref 3.5–5.1)
SODIUM: 137 mmol/L (ref 136–145)
Total Bilirubin: 0.4 mg/dL (ref 0.2–1.2)
Total Protein: 7.5 g/dL (ref 6.4–8.3)

## 2017-07-08 LAB — CBC WITH DIFFERENTIAL (CANCER CENTER ONLY)
BASOS PCT: 0 %
Basophils Absolute: 0 10*3/uL (ref 0.0–0.1)
EOS ABS: 0.4 10*3/uL (ref 0.0–0.5)
EOS PCT: 4 %
HCT: 40.4 % (ref 38.7–49.9)
HEMOGLOBIN: 13.6 g/dL (ref 13.0–17.1)
Lymphocytes Relative: 16 %
Lymphs Abs: 1.8 10*3/uL (ref 0.9–3.3)
MCH: 30.4 pg (ref 28.0–33.4)
MCHC: 33.7 g/dL (ref 32.0–35.9)
MCV: 90.4 fL (ref 82.0–98.0)
MONO ABS: 1 10*3/uL — AB (ref 0.1–0.9)
MONOS PCT: 9 %
Neutro Abs: 7.7 10*3/uL — ABNORMAL HIGH (ref 1.5–6.5)
Neutrophils Relative %: 71 %
PLATELETS: 151 10*3/uL (ref 145–400)
RBC: 4.47 MIL/uL (ref 4.20–5.70)
RDW: 14 % (ref 11.1–15.7)
WBC Count: 10.8 10*3/uL — ABNORMAL HIGH (ref 4.0–10.0)

## 2017-07-08 MED ORDER — FLUTAMIDE 125 MG PO CAPS: 250.0000 mg | ORAL_CAPSULE | Freq: Three times a day (TID) | ORAL | 3 refills | Status: DC

## 2017-07-08 NOTE — Progress Notes (Signed)
Hematology and Oncology Follow Up Visit  Ryan Davis [id number] 1960-06-16 57 y.o. 07/08/2017   Principle Diagnosis:  Metastatic prostate cancer - androgen sensitive  Past Therapy: 14 fractions of radiation to the lumbar spine Casodex 50 mg by mouth daily- stopped 04/02/2017 due to progression Zytiga 1000 mg by mouth daily/prednisone 10 mg by mouth daily - started on 04/20/2016- d/c on 04/02/2017  Current Therapy:   Xtandi160 mg po q day - started on 04/06/2017 -- d/c on 06/24/2017 Lupron 11.5 mg IM every 3 month - next dosedue May 2019 Zometa 4 mg IV q 3 months Flutamide 250 mg po q8hr -- start 07/08/2017   Interim History:  Ryan Davis is here today for an unscheduled visit.  He was supposed to start Taxotere today.  However, he decided that he did not wish to do any chemotherapy.  He has thought long and hard about this.  He is more worried about his poor wife.  She has a lot of health issues.  He does not want to get too sick and too weak and not be able to take care of her.  He understands that by not taking treatment, that his prostate cancer could easily progress more quickly that he could weaken and then not be able to take care of his wife.  I will try him on flutamide.  I think this might be reasonable.  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  He does have the back and hip pain.  This is been chronic.  His appetite is doing a little bit better.  He has had no nausea or vomiting.  Overall, I would say his performance status is ECOG 2.  Medications:  Allergies as of 07/08/2017   No Known Allergies     Medication List        Accurate as of 07/08/17 11:03 AM. Always use your most recent med list.          CALCIUM-VITAMIN D3 PO Take 1 tablet by mouth daily.   diltiazem 240 MG 24 hr capsule Commonly known as:  CARDIZEM CD TAKE 1 CAPSULE BY MOUTH EVERY DAY   fentaNYL 100 MCG/HR Commonly known as:  DURAGESIC - dosed mcg/hr Place 1 patch (100 mcg  total) onto the skin every 3 (three) days.   flutamide 125 MG capsule Commonly known as:  EULEXIN Take 2 capsules (250 mg total) by mouth 3 (three) times daily.   megestrol 625 MG/5ML suspension Commonly known as:  MEGACE ES Take 5 mLs (625 mg total) by mouth 2 (two) times daily.   metoprolol tartrate 25 MG tablet Commonly known as:  LOPRESSOR Take 0.5 tablets (12.5 mg total) by mouth 2 (two) times daily.   ondansetron 4 MG disintegrating tablet Commonly known as:  ZOFRAN ODT Take 1 tablet (4 mg total) by mouth every 8 (eight) hours as needed for nausea or vomiting.   oxyCODONE-acetaminophen 10-325 MG tablet Commonly known as:  PERCOCET Take 1 tablet by mouth every 6 (six) hours as needed for pain.       Allergies: No Known Allergies  Past Medical History, Surgical history, Social history, and Family History were reviewed and updated.  Review of Systems: Review of Systems  Constitutional: Negative.   HENT: Negative.   Eyes: Negative.   Respiratory: Negative.   Cardiovascular: Negative.   Gastrointestinal: Negative.   Genitourinary: Negative.   Musculoskeletal: Positive for back pain and myalgias.  Skin: Negative.   Neurological: Positive for weakness.  Endo/Heme/Allergies: Negative.  Psychiatric/Behavioral: Negative.      Physical Exam:  weight is 148 lb (67.1 kg). His oral temperature is 98.1 F (36.7 C). His blood pressure is 114/85 and his pulse is 85. His respiration is 18 and oxygen saturation is 100%.   Wt Readings from Last 3 Encounters:  07/08/17 148 lb (67.1 kg)  06/24/17 143 lb (64.9 kg)  05/28/17 136 lb (61.7 kg)    Physical Exam  Constitutional: He is oriented to person, place, and time.  HENT:  Head: Normocephalic and atraumatic.  Mouth/Throat: Oropharynx is clear and moist.  Eyes: Pupils are equal, round, and reactive to light. EOM are normal.  Neck: Normal range of motion.  Cardiovascular: Normal rate, regular rhythm and normal heart  sounds.  Pulmonary/Chest: Effort normal and breath sounds normal.  Abdominal: Soft. Bowel sounds are normal.  Musculoskeletal: Normal range of motion. He exhibits no edema, tenderness or deformity.  Lymphadenopathy:    He has no cervical adenopathy.  Neurological: He is alert and oriented to person, place, and time.  Skin: Skin is warm and dry. No rash noted. No erythema.  Psychiatric: He has a normal mood and affect. His behavior is normal. Judgment and thought content normal.  Vitals reviewed.     Lab Results  Component Value Date   WBC 10.8 (H) 07/08/2017   HGB 13.6 07/08/2017   HCT 40.4 07/08/2017   MCV 90.4 07/08/2017   PLT 151 07/08/2017   Lab Results  Component Value Date   FERRITIN 216 02/04/2017   IRON 82 02/04/2017   TIBC 223 02/04/2017   UIBC 140 02/04/2017   IRONPCTSAT 37 (L) 02/04/2017   Lab Results  Component Value Date   RBC 4.47 07/08/2017   Lab Results  Component Value Date   KAPLAMBRATIO 0.89 03/28/2016   Lab Results  Component Value Date   IGGSERUM 1,066 03/28/2016   IGMSERUM 106 03/28/2016   No results found for: Odetta Pink, SPEI   Chemistry      Component Value Date/Time   NA 137 06/24/2017 1138   NA 143 12/16/2016 1022   K 3.7 06/24/2017 1138   K 3.5 12/16/2016 1022   CL 101 06/24/2017 1138   CL 101 12/16/2016 1022   CO2 23 06/24/2017 1138   CO2 33 12/16/2016 1022   BUN 8 06/24/2017 1138   BUN 4 (L) 12/16/2016 1022   CREATININE 0.81 06/24/2017 1138   CREATININE 0.8 12/16/2016 1022      Component Value Date/Time   CALCIUM 9.8 06/24/2017 1138   CALCIUM 9.4 12/16/2016 1022   ALKPHOS 163 (H) 06/24/2017 1138   ALKPHOS 77 12/16/2016 1022   AST 25 06/24/2017 1138   ALT 35 06/24/2017 1138   ALT 16 12/16/2016 1022   BILITOT 0.4 06/24/2017 1138      Impression and Plan: Ryan Davis is a very pleasant 57 yo African American gentleman with metastatic prostate cancer.   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  His last PSA at the end of May was 520.  We will see how he does with the flutamide.  Hopefully, this might slow things down a little bit.  I am more worried that his quality of life is going to be compromised without taking Taxotere.  I told him that if he ever change his mind and decided to take Taxotere, we would certainly entertain that possibility.  I know that he is doing everything that he can do.  He just  is worried about his poor wife who actually is worse off than he is.  I will see him back in 6 weeks.  [name], MD 6/12/201911:03 AM

## 2017-07-09 LAB — PSA, TOTAL AND FREE
PROSTATE SPECIFIC AG, SERUM: 297.7 ng/mL — AB (ref 0.0–4.0)
PSA, Free Pct: 6.8 %
PSA, Free: 20.38 ng/mL

## 2017-07-10 ENCOUNTER — Inpatient Hospital Stay: Payer: Medicaid Other

## 2017-07-13 ENCOUNTER — Other Ambulatory Visit: Payer: Self-pay | Admitting: Hematology & Oncology

## 2017-07-13 DIAGNOSIS — I4891 Unspecified atrial fibrillation: Secondary | ICD-10-CM

## 2017-07-13 DIAGNOSIS — C61 Malignant neoplasm of prostate: Secondary | ICD-10-CM

## 2017-07-13 DIAGNOSIS — C7951 Secondary malignant neoplasm of bone: Secondary | ICD-10-CM

## 2017-07-15 ENCOUNTER — Other Ambulatory Visit: Payer: Self-pay | Admitting: *Deleted

## 2017-07-15 ENCOUNTER — Telehealth: Payer: Self-pay | Admitting: Pharmacist

## 2017-07-15 DIAGNOSIS — C61 Malignant neoplasm of prostate: Secondary | ICD-10-CM

## 2017-07-15 DIAGNOSIS — M5441 Lumbago with sciatica, right side: Secondary | ICD-10-CM

## 2017-07-15 DIAGNOSIS — G8929 Other chronic pain: Secondary | ICD-10-CM

## 2017-07-15 DIAGNOSIS — C7951 Secondary malignant neoplasm of bone: Principal | ICD-10-CM

## 2017-07-15 DIAGNOSIS — M5442 Lumbago with sciatica, left side: Secondary | ICD-10-CM

## 2017-07-15 DIAGNOSIS — R52 Pain, unspecified: Secondary | ICD-10-CM

## 2017-07-15 MED ORDER — OXYCODONE-ACETAMINOPHEN 10-325 MG PO TABS: 1.0000 | ORAL_TABLET | Freq: Four times a day (QID) | ORAL | 0 refills | Status: DC | PRN

## 2017-07-15 MED ORDER — FENTANYL 100 MCG/HR TD PT72: 100.0000 ug | MEDICATED_PATCH | TRANSDERMAL | 0 refills | Status: DC

## 2017-07-15 NOTE — Telephone Encounter (Signed)
Oral Chemotherapy Pharmacist Encounter   Contacted by Roselyn Reef, RN who informed me that Mr. Barbar contacted the office stating that he tried to get it filled at [facility] but it was too expensive.   Mr. Pintor no longer has his commercial insurance but still has his medicaid. I called  Mr. Cryer on 07/13/17 and let him know that he needs to call his Medicaid case worker and let them know that he no longer has his commercial insurance.  He told me he was on his way to the Medicaid office that day to get that done.  Called today 07/15/17 to follow-up and per Mr. Melroy, the issue has been resolved and he will be able to fill his medication.    Darl Pikes, PharmD, BCPS Hematology/Oncology Clinical Pharmacist [facility] [phone]  07/15/2017 9:43 AM

## 2017-07-29 ENCOUNTER — Ambulatory Visit: Payer: Self-pay | Admitting: Family

## 2017-07-29 ENCOUNTER — Other Ambulatory Visit: Payer: Self-pay

## 2017-07-29 ENCOUNTER — Emergency Department (HOSPITAL_BASED_OUTPATIENT_CLINIC_OR_DEPARTMENT_OTHER)
Admission: EM | Admit: 2017-07-29 | Discharge: 2017-07-29 | Disposition: A | Discharge status: Home / Self Care | Payer: Medicaid Other | Attending: Emergency Medicine | Admitting: Emergency Medicine

## 2017-07-29 ENCOUNTER — Encounter (HOSPITAL_BASED_OUTPATIENT_CLINIC_OR_DEPARTMENT_OTHER): Payer: Self-pay | Admitting: Emergency Medicine

## 2017-07-29 ENCOUNTER — Emergency Department (HOSPITAL_BASED_OUTPATIENT_CLINIC_OR_DEPARTMENT_OTHER): Payer: Medicaid Other

## 2017-07-29 ENCOUNTER — Ambulatory Visit: Payer: Self-pay

## 2017-07-29 DIAGNOSIS — R300 Dysuria: Secondary | ICD-10-CM | POA: Diagnosis not present

## 2017-07-29 DIAGNOSIS — R748 Abnormal levels of other serum enzymes: Secondary | ICD-10-CM | POA: Insufficient documentation

## 2017-07-29 DIAGNOSIS — R3911 Hesitancy of micturition: Secondary | ICD-10-CM | POA: Diagnosis not present

## 2017-07-29 DIAGNOSIS — R1110 Vomiting, unspecified: Secondary | ICD-10-CM | POA: Diagnosis present

## 2017-07-29 DIAGNOSIS — R197 Diarrhea, unspecified: Secondary | ICD-10-CM | POA: Insufficient documentation

## 2017-07-29 DIAGNOSIS — R112 Nausea with vomiting, unspecified: Secondary | ICD-10-CM | POA: Diagnosis not present

## 2017-07-29 DIAGNOSIS — F17210 Nicotine dependence, cigarettes, uncomplicated: Secondary | ICD-10-CM | POA: Diagnosis not present

## 2017-07-29 DIAGNOSIS — R778 Other specified abnormalities of plasma proteins: Secondary | ICD-10-CM

## 2017-07-29 DIAGNOSIS — R109 Unspecified abdominal pain: Secondary | ICD-10-CM | POA: Insufficient documentation

## 2017-07-29 DIAGNOSIS — Z79899 Other long term (current) drug therapy: Secondary | ICD-10-CM | POA: Insufficient documentation

## 2017-07-29 DIAGNOSIS — E860 Dehydration: Secondary | ICD-10-CM | POA: Diagnosis not present

## 2017-07-29 DIAGNOSIS — Z923 Personal history of irradiation: Secondary | ICD-10-CM | POA: Diagnosis not present

## 2017-07-29 DIAGNOSIS — R7989 Other specified abnormal findings of blood chemistry: Secondary | ICD-10-CM

## 2017-07-29 DIAGNOSIS — Z8546 Personal history of malignant neoplasm of prostate: Secondary | ICD-10-CM | POA: Insufficient documentation

## 2017-07-29 LAB — COMPREHENSIVE METABOLIC PANEL
ALT: 18 U/L (ref 0–44)
ANION GAP: 12 (ref 5–15)
AST: 20 U/L (ref 15–41)
Albumin: 4.5 g/dL (ref 3.5–5.0)
Alkaline Phosphatase: 190 U/L — ABNORMAL HIGH (ref 38–126)
BILIRUBIN TOTAL: 0.5 mg/dL (ref 0.3–1.2)
BUN: 11 mg/dL (ref 6–20)
CHLORIDE: 106 mmol/L (ref 98–111)
CO2: 20 mmol/L — ABNORMAL LOW (ref 22–32)
Calcium: 8.7 mg/dL — ABNORMAL LOW (ref 8.9–10.3)
Creatinine, Ser: 0.79 mg/dL (ref 0.61–1.24)
Glucose, Bld: 131 mg/dL — ABNORMAL HIGH (ref 70–99)
Potassium: 3.4 mmol/L — ABNORMAL LOW (ref 3.5–5.1)
Sodium: 138 mmol/L (ref 135–145)
TOTAL PROTEIN: 7.8 g/dL (ref 6.5–8.1)

## 2017-07-29 LAB — CBC WITH DIFFERENTIAL/PLATELET
BASOS ABS: 0 10*3/uL (ref 0.0–0.1)
BASOS PCT: 0 %
Eosinophils Absolute: 0 10*3/uL (ref 0.0–0.7)
Eosinophils Relative: 0 %
HEMATOCRIT: 43.5 % (ref 39.0–52.0)
Hemoglobin: 15.2 g/dL (ref 13.0–17.0)
LYMPHS PCT: 13 %
Lymphs Abs: 1.9 10*3/uL (ref 0.7–4.0)
MCH: 30.3 pg (ref 26.0–34.0)
MCHC: 34.9 g/dL (ref 30.0–36.0)
MCV: 86.8 fL (ref 78.0–100.0)
Monocytes Absolute: 1.1 10*3/uL — ABNORMAL HIGH (ref 0.1–1.0)
Monocytes Relative: 8 %
NEUTROS ABS: 11.3 10*3/uL — AB (ref 1.7–7.7)
Neutrophils Relative %: 79 %
Platelets: 180 10*3/uL (ref 150–400)
RBC: 5.01 MIL/uL (ref 4.22–5.81)
RDW: 14.2 % (ref 11.5–15.5)
WBC: 14.3 10*3/uL — AB (ref 4.0–10.5)

## 2017-07-29 LAB — URINALYSIS, ROUTINE W REFLEX MICROSCOPIC
BILIRUBIN URINE: NEGATIVE
Glucose, UA: NEGATIVE mg/dL
Hgb urine dipstick: NEGATIVE
KETONES UR: 15 mg/dL — AB
LEUKOCYTES UA: NEGATIVE
NITRITE: NEGATIVE
PH: 6.5 (ref 5.0–8.0)
Protein, ur: NEGATIVE mg/dL

## 2017-07-29 LAB — TROPONIN I
TROPONIN I: 0.1 ng/mL — AB (ref <0.03)
Troponin I: 0.1 ng/mL (ref <0.03)

## 2017-07-29 LAB — LIPASE, BLOOD: LIPASE: 20 U/L (ref 11–51)

## 2017-07-29 MED ORDER — TAMSULOSIN HCL 0.4 MG PO CAPS: 0.4000 mg | ORAL_CAPSULE | Freq: Every day | ORAL | 0 refills | Status: DC

## 2017-07-29 MED ORDER — IOPAMIDOL (ISOVUE-300) INJECTION 61%
100.0000 mL | Freq: Once | INTRAVENOUS | Status: AC | PRN
  Administered 2017-07-29: 100 mL via INTRAVENOUS

## 2017-07-29 MED ORDER — IOPAMIDOL (ISOVUE-300) INJECTION 61%
30.0000 mL | Freq: Once | INTRAVENOUS | Status: AC | PRN
  Administered 2017-07-29: 30 mL via ORAL

## 2017-07-29 MED ORDER — ONDANSETRON 8 MG PO TBDP
8.0000 mg | ORAL_TABLET | Freq: Once | ORAL | Status: AC
  Administered 2017-07-29: 8 mg via ORAL
  Filled 2017-07-29: qty 1

## 2017-07-29 MED ORDER — SODIUM CHLORIDE 0.9 % IV BOLUS
1000.0000 mL | Freq: Once | INTRAVENOUS | Status: AC
  Administered 2017-07-29: 1000 mL via INTRAVENOUS

## 2017-07-29 MED ORDER — MORPHINE SULFATE (PF) 4 MG/ML IV SOLN
4.0000 mg | Freq: Once | INTRAVENOUS | Status: AC
  Administered 2017-07-29: 4 mg via INTRAVENOUS
  Filled 2017-07-29: qty 1

## 2017-07-29 MED ORDER — ONDANSETRON HCL 4 MG PO TABS: 4.0000 mg | ORAL_TABLET | Freq: Four times a day (QID) | ORAL | 0 refills | Status: DC | PRN

## 2017-07-29 MED FILL — TAMSULOSIN HCL 0.4 MG CAP: 0.4 | 30 days supply | Qty: 30 | Fill #0

## 2017-07-29 MED FILL — ONDANSETRON HCL 4 MG TABLET: 4 | 3 days supply | Qty: 12 | Fill #0

## 2017-07-29 NOTE — ED Provider Notes (Signed)
[facility] EMERGENCY DEPARTMENT Provider Note   CSN: [id number] Arrival date & time: 07/29/17  1000     History   Chief Complaint Chief Complaint  Patient presents with  . Emesis    HPI Ryan Davis is a 57 y.o. male.  The history is provided by the patient. No language interpreter was used.  Emesis     Ryan Davis is a 57 y.o. male who presents to the Emergency Department complaining of vomiting, abdominal pain. He reports four days of generalized abdominal pain followed by three days of numerous episodes of emesis and diarrhea. Diarrhea just started yesterday. He is not been able to eat or drink anything for the last three days. He denies any fevers, chest pain. He does have some mild dysuria. He is currently being treated for metastatic prostate cancer. No known sick contacts. No prior similar symptoms. Symptoms are severe, constant, worsening. Past Medical History:  Diagnosis Date  . A-fib [facility])    see 03-28-16 admission  . Back pain without radiculopathy 03/28/2016  . Dyspnea    endorses since treatment radiation , still gets SOB occasionally   . Dysrhythmia    afib   . Goals of care, counseling/discussion 06/24/2017  . History of radiation therapy 09/16/16-10/06/16   35 Gy in 14 fractions to the lumbar spine  . Nocturia   . Prostate cancer metastatic to bone ([place]) 03/31/2016  . Prostate cancer metastatic to multiple sites [facility]) 03/31/2016    Patient Active Problem List   Diagnosis Date Noted  . Goals of care, counseling/discussion 06/24/2017  . 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[place]) 03/31/2016  . Prostate cancer metastatic to bone ([place]) 03/31/2016  . Atrial fibrillation ([place]) 03/30/2016  . Back pain without radiculopathy 03/28/2016  . Atrial fibrillation with RVR ([place]) 03/28/2016    Past Surgical History:  Procedure Laterality Date  . MULTIPLE EXTRACTIONS WITH ALVEOLOPLASTY N/A 11/19/2016   Procedure: EXTRACTION OF TOOTH #'S 1,3,5-7,9-17,  AND 20- 29 WITH ALVEOLOPLASTY, BILATERAL MANDIBULAR TORI REDUCTIONS AND BILATERAL MAXILLARY BUCCAL EXOSTOSES REDUCTIONS;  Surgeon: Lenn Cal, DDS;  Location: [facility] ORS;  Service: Oral Surgery;  Laterality: N/A;  . NO PAST SURGERIES          Home Medications    Prior to Admission medications   Medication Sig Start Date End Date Taking? Authorizing Provider  Calcium Carbonate-Vitamin D (CALCIUM-VITAMIN D3 PO) Take 1 tablet by mouth daily.    [provider]  diltiazem (CARDIZEM CD) 240 MG 24 hr capsule TAKE 1 CAPSULE BY MOUTH EVERY DAY 06/12/17   [name], MD  diltiazem (CARDIZEM CD) 240 MG 24 hr capsule TAKE 1 CAPSULE BY MOUTH EVERY DAY 07/13/17   [name], MD  fentaNYL (DURAGESIC - DOSED MCG/HR) 100 MCG/HR Place 1 patch (100 mcg total) onto the skin every 3 (three) days. Patient taking differently: Place 100 mcg onto the skin every 3 (three) days.  07/15/17   [name], MD  flutamide (EULEXIN) 125 MG capsule Take 2 capsules (250 mg total) by mouth 3 (three) times daily. 07/08/17   [name], MD  megestrol (MEGACE ES) 625 MG/5ML suspension Take 5 mLs (625 mg total) by mouth 2 (two) times daily. 05/29/17   [name], MD  metoprolol tartrate (LOPRESSOR) 25 MG tablet TAKE 1/2 TABLET(12.5 MG) BY MOUTH TWICE DAILY 07/13/17   [name], MD  ondansetron (ZOFRAN ODT) 4 MG disintegrating tablet Take 1 tablet (4 mg total) by  mouth every 8 (eight) hours as needed for nausea or vomiting. 02/23/17   Mackuen, Courteney Lyn, MD  oxyCODONE-acetaminophen (PERCOCET) 10-325 MG tablet Take 1 tablet by mouth every 6 (six) hours as needed for pain. 07/15/17   [name], MD    Family History Family History  Problem Relation Age of Onset  . Stroke Sister   . Cancer Neg Hx   . Heart disease Neg Hx   . Diabetes Neg Hx     Social History Social History   Tobacco Use  . Smoking status: Current Some Day Smoker    Packs/day: 0.25    Years: 39.00     Pack years: 9.75    Types: Cigarettes    Last attempt to quit: 10/13/2016    Years since quitting: 0.7  . Smokeless tobacco: Never Used  . Tobacco comment: 04/18/2016 still smokes; 11-17-16 states some day smoker   Substance Use Topics  . Alcohol use: No    Comment: has not drank in 7 months.  He used to drink 40oz  of beer daily  . Drug use: No     Allergies   Patient has no known allergies.   Review of Systems Review of Systems  Gastrointestinal: Positive for vomiting.  All other systems reviewed and are negative.    Physical Exam Updated Vital Signs BP (!) 164/99 (BP Location: Right Arm)   Pulse 76   Temp 98.4 F (36.9 C) (Oral)   Resp 18   Ht 6' 2.5" (1.892 m)   Wt 67.1 kg (148 lb)   SpO2 100%   BMI 18.75 kg/m   Physical Exam  Constitutional: He is oriented to person, place, and time. He appears well-developed and well-nourished.  HENT:  Head: Normocephalic and atraumatic.  Dry mucous membranes  Cardiovascular: Normal rate and regular rhythm.  No murmur heard. Pulmonary/Chest: Effort normal and breath sounds normal. No respiratory distress.  Abdominal: Soft. There is no rebound and no guarding.  Moderate generalized abdominal tenderness  Musculoskeletal: He exhibits no edema or tenderness.  Neurological: He is alert and oriented to person, place, and time.  Skin: Skin is warm and dry.  Psychiatric: He has a normal mood and affect. His behavior is normal.  Nursing note and vitals reviewed.    ED Treatments / Results  Labs (all labs ordered are listed, but only abnormal results are displayed) Labs Reviewed  COMPREHENSIVE METABOLIC PANEL  LIPASE, BLOOD  CBC WITH DIFFERENTIAL/PLATELET  TROPONIN I    EKG EKG Interpretation  Date/Time:  Wednesday July 29 2017 10:12:05 EDT Ventricular Rate:  66 PR Interval:    QRS Duration: 88 QT Interval:  401 QTC Calculation: 421 R Axis:   70 Text Interpretation:  Sinus rhythm Consider left atrial enlargement  Anteroseptal infarct, old Minimal ST depression, inferior leads Confirmed by Quintella Reichert [phone]) on 07/29/2017 10:52:14 AM   Radiology No results found.  Procedures Procedures (including critical care time)  Medications Ordered in ED Medications  sodium chloride 0.9 % bolus 1,000 mL (has no administration in time range)  ondansetron (ZOFRAN-ODT) disintegrating tablet 8 mg (has no administration in time range)  morphine 4 MG/ML injection 4 mg (has no administration in time range)     Initial Impression / Assessment and Plan / ED Course  I have reviewed the triage vital signs and the nursing notes.  Pertinent labs & imaging results that were available during my care of the patient were reviewed by me and considered in my medical decision  making (see chart for details).     Patient with history of metastatic prostate cancer here for evaluation of three days of vomiting and diarrhea with abdominal pain. He is dehydrated appearing on examination with generalized abdominal tenderness. He was treated with pain control, IV fluids and antiemetics in the emergency department. On repeat assessment he is feeling improved and able to tolerate oral fluids. His troponin is mildly elevated but is not rising and it has been elevated in the past. He does deny any chest pain. Current presentation is not consistent with ACS, PE, dissection. CT abdomen is negative for acute abnormalities. Discussed with patient recommendation of admission for observation and IV fluid administration and patient declines. He wants to go home to take care of his ill wife.  Patient care transferred pending UA, patient recheck, PO challenge.    Final Clinical Impressions(s) / ED Diagnoses   Final diagnoses:  None    ED Discharge Orders    None       Quintella Reichert, MD 07/29/17 [phone]

## 2017-07-29 NOTE — ED Notes (Signed)
Unable to obtain blood with IV access 

## 2017-07-29 NOTE — ED Notes (Signed)
Patient transported to CT 

## 2017-07-29 NOTE — ED Notes (Signed)
IVFs placed on pump at 977ml/hr for remainder of bag.

## 2017-07-29 NOTE — ED Triage Notes (Signed)
Pt reports vomiting x 3d

## 2017-07-31 ENCOUNTER — Ambulatory Visit: Payer: Self-pay

## 2017-08-13 ENCOUNTER — Other Ambulatory Visit: Payer: Self-pay | Admitting: *Deleted

## 2017-08-13 DIAGNOSIS — C61 Malignant neoplasm of prostate: Secondary | ICD-10-CM

## 2017-08-13 DIAGNOSIS — R52 Pain, unspecified: Secondary | ICD-10-CM

## 2017-08-13 MED ORDER — OXYCODONE-ACETAMINOPHEN 10-325 MG PO TABS: 1.0000 | ORAL_TABLET | Freq: Four times a day (QID) | ORAL | 0 refills | Status: DC | PRN

## 2017-08-19 ENCOUNTER — Inpatient Hospital Stay: Payer: Medicaid Other

## 2017-08-19 ENCOUNTER — Inpatient Hospital Stay: Payer: Medicaid Other | Attending: Hematology & Oncology | Admitting: Family

## 2017-08-19 ENCOUNTER — Ambulatory Visit: Payer: Self-pay

## 2017-08-19 ENCOUNTER — Other Ambulatory Visit: Payer: Self-pay

## 2017-08-19 ENCOUNTER — Other Ambulatory Visit: Payer: Self-pay | Admitting: *Deleted

## 2017-08-19 VITALS — BP 131/74 | HR 100 | Temp 98.4°F | Resp 17 | Wt 152.5 lb

## 2017-08-19 DIAGNOSIS — C61 Malignant neoplasm of prostate: Secondary | ICD-10-CM

## 2017-08-19 DIAGNOSIS — C7951 Secondary malignant neoplasm of bone: Secondary | ICD-10-CM | POA: Insufficient documentation

## 2017-08-19 DIAGNOSIS — E291 Testicular hypofunction: Secondary | ICD-10-CM | POA: Insufficient documentation

## 2017-08-19 DIAGNOSIS — Z79899 Other long term (current) drug therapy: Secondary | ICD-10-CM | POA: Diagnosis not present

## 2017-08-19 DIAGNOSIS — M5442 Lumbago with sciatica, left side: Secondary | ICD-10-CM

## 2017-08-19 DIAGNOSIS — D500 Iron deficiency anemia secondary to blood loss (chronic): Secondary | ICD-10-CM

## 2017-08-19 DIAGNOSIS — M5441 Lumbago with sciatica, right side: Secondary | ICD-10-CM

## 2017-08-19 DIAGNOSIS — G8929 Other chronic pain: Secondary | ICD-10-CM

## 2017-08-19 LAB — CBC WITH DIFFERENTIAL (CANCER CENTER ONLY)
BASOS ABS: 0 10*3/uL (ref 0.0–0.1)
BASOS PCT: 0 %
EOS ABS: 0.2 10*3/uL (ref 0.0–0.5)
EOS PCT: 3 %
HCT: 41.2 % (ref 38.7–49.9)
Hemoglobin: 13.7 g/dL (ref 13.0–17.1)
LYMPHS PCT: 26 %
Lymphs Abs: 2.1 10*3/uL (ref 0.9–3.3)
MCH: 30.3 pg (ref 28.0–33.4)
MCHC: 33.3 g/dL (ref 32.0–35.9)
MCV: 91.2 fL (ref 82.0–98.0)
MONO ABS: 0.5 10*3/uL (ref 0.1–0.9)
Monocytes Relative: 6 %
Neutro Abs: 5.2 10*3/uL (ref 1.5–6.5)
Neutrophils Relative %: 65 %
PLATELETS: 200 10*3/uL (ref 145–400)
RBC: 4.52 MIL/uL (ref 4.20–5.70)
RDW: 15.1 % (ref 11.1–15.7)
WBC: 8 10*3/uL (ref 4.0–10.0)

## 2017-08-19 LAB — CMP (CANCER CENTER ONLY)
ALBUMIN: 4.4 g/dL (ref 3.5–5.0)
ALT: 15 U/L (ref 10–47)
AST: 17 U/L (ref 11–38)
Alkaline Phosphatase: 118 U/L — ABNORMAL HIGH (ref 26–84)
Anion gap: 6 (ref 5–15)
BUN: 6 mg/dL — AB (ref 7–22)
CALCIUM: 9.7 mg/dL (ref 8.0–10.3)
CO2: 28 mmol/L (ref 18–33)
CREATININE: 0.9 mg/dL (ref 0.60–1.20)
Chloride: 108 mmol/L (ref 98–108)
GLUCOSE: 131 mg/dL — AB (ref 73–118)
Potassium: 3.3 mmol/L (ref 3.3–4.7)
SODIUM: 142 mmol/L (ref 128–145)
TOTAL PROTEIN: 7.9 g/dL (ref 6.4–8.1)
Total Bilirubin: 0.7 mg/dL (ref 0.2–1.6)

## 2017-08-19 MED ORDER — FENTANYL 50 MCG/HR TD PT72: 50.0000 ug | MEDICATED_PATCH | TRANSDERMAL | 0 refills | Status: DC

## 2017-08-19 MED ORDER — FLUTAMIDE 125 MG PO CAPS
250.0000 mg | ORAL_CAPSULE | Freq: Three times a day (TID) | ORAL | 3 refills | Status: DC
Start: 2017-08-19 — End: 2017-08-19

## 2017-08-19 MED ORDER — FLUTAMIDE 125 MG PO CAPS: 250.0000 mg | ORAL_CAPSULE | Freq: Three times a day (TID) | ORAL | 3 refills | Status: DC

## 2017-08-19 NOTE — Progress Notes (Addendum)
Hematology and Oncology Follow Up Visit  Ryan Davis [id number] [birth year] 57 y.o. 08/19/2017   Principle Diagnosis:  Metastatic prostate cancer - androgen sensitive  Past Therapy: 14 fractions of radiation to the lumbar spine Casodex 50 mg by mouth daily- stopped 04/02/2017 due to progression Zytiga 1000 mg by mouth daily/prednisone 10 mg by mouth daily - started on 04/20/2016- d/c on 04/02/2017 Xtandi160 mg po q day - startedon 04/06/2017 -- d/c on 06/24/2017  Current Therapy:   Lupron 11.5 mg IM every 3 month  Zometa 4 mg IV q 3 months Flutamide 250 mg po q8hr - started 07/08/2017   Interim History: Ryan Davis is here today for follow-up. He is doing fairly well but states that despite the Duragesic patch and taking his percocet as directed he is still having a great deal of back pain.  PSA in June was 297.  He verbalized that he is taking the Flutamide 250 mg PO TID as prescribed. Prescription was sent back to [facility].  He has some numbness and tingling in his fingertips. This is unchanged. No swelling in his extremities.  No fever, chills, n/v, cough, rash, dizziness, SOB, chest pain, palpitations, abdominal pain or changes in bowel or bladder habits.  He is still having constipation and will try Mirilax and prune juice.  He denies any episodes of bleeding, no bruising or petechiae.  His appetite is down but he is still eating and drink Boost each day. He states that he is hydrating well at home. His weight is up 4 lbs since his last visit.  He has been able to exercise some walking on the treadmill. He stays busy caring for his wife.   ECOG Performance Status: 1 - Symptomatic but completely ambulatory  Medications:  Allergies as of 08/19/2017      Reactions   Contrast Media [iodinated Diagnostic Agents] Nausea And Vomiting   Patient vomits immediately after IV contrast is injected just prior to start of scan, was unable to scan patient in correct time frame.  Patient  did not warn me of this when dosing the patient with oral CM and asking the patient history questions until the patient was on the table for the CT scan just prior to injection.  Ct Tech:  Karl Bales 07/29/17       Medication List        Accurate as of 08/19/17 12:10 PM. Always use your most recent med list.          CALCIUM-VITAMIN D3 PO Take 1 tablet by mouth daily.   diltiazem 240 MG 24 hr capsule Commonly known as:  CARDIZEM CD TAKE 1 CAPSULE BY MOUTH EVERY DAY   diltiazem 240 MG 24 hr capsule Commonly known as:  CARDIZEM CD TAKE 1 CAPSULE BY MOUTH EVERY DAY   fentaNYL 100 MCG/HR Commonly known as:  DURAGESIC - dosed mcg/hr Place 1 patch (100 mcg total) onto the skin every 3 (three) days.   flutamide 125 MG capsule Commonly known as:  EULEXIN Take 2 capsules (250 mg total) by mouth 3 (three) times daily.   megestrol 625 MG/5ML suspension Commonly known as:  MEGACE ES Take 5 mLs (625 mg total) by mouth 2 (two) times daily.   metoprolol tartrate 25 MG tablet Commonly known as:  LOPRESSOR TAKE 1/2 TABLET(12.5 MG) BY MOUTH TWICE DAILY   ondansetron 4 MG disintegrating tablet Commonly known as:  ZOFRAN ODT Take 1 tablet (4 mg total) by mouth every 8 (eight) hours as needed for  nausea or vomiting.   ondansetron 4 MG tablet Commonly known as:  ZOFRAN Take 1 tablet (4 mg total) by mouth every 6 (six) hours as needed for nausea or vomiting.   oxyCODONE-acetaminophen 10-325 MG tablet Commonly known as:  PERCOCET Take 1 tablet by mouth every 6 (six) hours as needed for pain.   tamsulosin 0.4 MG Caps capsule Commonly known as:  FLOMAX Take 1 capsule (0.4 mg total) by mouth daily.       Allergies:  Allergies  Allergen Reactions  . Contrast Media [Iodinated Diagnostic Agents] Nausea And Vomiting    Patient vomits immediately after IV contrast is injected just prior to start of scan, was unable to scan patient in correct time frame.  Patient did not warn me of this  when dosing the patient with oral CM and asking the patient history questions until the patient was on the table for the CT scan just prior to injection.  Ct Tech:  Karl Bales 07/29/17     Past Medical History, Surgical history, Social history, and Family History were reviewed and updated.  Review of Systems: All other 10 point review of systems is negative.   Physical Exam:  weight is 152 lb 8 oz (69.2 kg). His oral temperature is 98.4 F (36.9 C). His blood pressure is 131/74 and his pulse is 100. His respiration is 17 and oxygen saturation is 100%.   Wt Readings from Last 3 Encounters:  08/19/17 152 lb 8 oz (69.2 kg)  07/29/17 148 lb (67.1 kg)  07/08/17 148 lb (67.1 kg)    Ocular: Sclerae unicteric, pupils equal, round and reactive to light Ear-nose-throat: Oropharynx clear, dentition fair Lymphatic: No cervical, supraclavicular or axillary adenopathy Lungs no rales or rhonchi, good excursion bilaterally Heart regular rate and rhythm, no murmur appreciated Abd soft, nontender, positive bowel sounds, no liver or spleen tip palpated on exam, no fluid wave  MSK no focal spinal tenderness, no joint edema Neuro: non-focal, well-oriented, appropriate affect Breasts: Deferred   Lab Results  Component Value Date   WBC 8.0 08/19/2017   HGB 13.7 08/19/2017   HCT 41.2 08/19/2017   MCV 91.2 08/19/2017   PLT 200 08/19/2017   Lab Results  Component Value Date   FERRITIN 216 02/04/2017   IRON 82 02/04/2017   TIBC 223 02/04/2017   UIBC 140 02/04/2017   IRONPCTSAT 37 (L) 02/04/2017   Lab Results  Component Value Date   RBC 4.52 08/19/2017   Lab Results  Component Value Date   KAPLAMBRATIO 0.89 03/28/2016   Lab Results  Component Value Date   IGGSERUM 1,066 03/28/2016   IGMSERUM 106 03/28/2016   No results found for: Odetta Pink, SPEI   Chemistry      Component Value Date/Time   NA 142 08/19/2017 1111   NA 143  12/16/2016 1022   K 3.3 08/19/2017 1111   K 3.5 12/16/2016 1022   CL 108 08/19/2017 1111   CL 101 12/16/2016 1022   CO2 28 08/19/2017 1111   CO2 33 12/16/2016 1022   BUN 6 (L) 08/19/2017 1111   BUN 4 (L) 12/16/2016 1022   CREATININE 0.90 08/19/2017 1111   CREATININE 0.8 12/16/2016 1022      Component Value Date/Time   CALCIUM 9.7 08/19/2017 1111   CALCIUM 9.4 12/16/2016 1022   ALKPHOS 118 (H) 08/19/2017 1111   ALKPHOS 77 12/16/2016 1022   AST 17 08/19/2017 1111   ALT 15 08/19/2017 1111  ALT 16 12/16/2016 1022   BILITOT 0.7 08/19/2017 1111      Impression and Plan: Ryan Davis is a very pleasant 57 yo African American gentleman with metastatic prostate cancer. His last PCA was 297. Today's is pending.  He verbalized that he is taking his Flutamide as prescribed and prescription was refilled today.  Zometa and Lupron are due again in August.  I spoke with Dr. Marin Olp about his back pain and we will increase his Duragesic patch to 150 mcg.  The patient verbalized understanding of how he will wear 1 100 mcg patch and 1 50 mcg patch, removing both every 3 days and applying new ones.  We will plan to see her back in another month.  He will contact our office with any questions or concerns. We can certainly see her sooner if need be.   Laverna Peace, NP 7/24/201912:10 PM    Addendum: Flutamide currently on back order. I spoke with Dr. Marin Olp and we will have him finish what he has left and then restart once available in August. PSA now down to 9 so he appears to be having a good response.

## 2017-08-20 ENCOUNTER — Telehealth: Payer: Self-pay | Admitting: Pharmacist

## 2017-08-20 LAB — SAMPLE TO BLOOD BANK

## 2017-08-20 LAB — IRON AND TIBC
Iron: 111 ug/dL (ref 42–163)
Saturation Ratios: 47 % (ref 42–163)
TIBC: 236 ug/dL (ref 202–409)
UIBC: 125 ug/dL

## 2017-08-20 LAB — PROSTATE-SPECIFIC AG, SERUM (LABCORP): PROSTATE SPECIFIC AG, SERUM: 9.3 ng/mL — AB (ref 0.0–4.0)

## 2017-08-20 LAB — FERRITIN: Ferritin: 269 ng/mL (ref 24–336)

## 2017-08-20 NOTE — Telephone Encounter (Signed)
Oral Chemotherapy Pharmacist Encounter   Received a call from [facility] stating that flutamide was on backorder and would not be available until August/September.  I spoke with [facility] and they will alert the office when the medication becomes available again.    Darl Pikes, PharmD, BCPS, [facility] Hematology/Oncology Clinical Pharmacist [facility] Oral [facility] [phone]  08/20/2017 8:51 AM

## 2017-08-21 ENCOUNTER — Ambulatory Visit: Payer: Self-pay

## 2017-08-25 ENCOUNTER — Other Ambulatory Visit: Payer: Self-pay | Admitting: *Deleted

## 2017-08-25 DIAGNOSIS — C61 Malignant neoplasm of prostate: Secondary | ICD-10-CM

## 2017-08-25 MED ORDER — ONDANSETRON HCL 4 MG PO TABS: 4.0000 mg | ORAL_TABLET | Freq: Four times a day (QID) | ORAL | 0 refills | Status: DC | PRN

## 2017-08-25 MED ORDER — TAMSULOSIN HCL 0.4 MG PO CAPS: 0.4000 mg | ORAL_CAPSULE | Freq: Every day | ORAL | 0 refills | Status: DC

## 2017-08-25 MED FILL — TAMSULOSIN HCL 0.4 MG CAP: 0.4 | 30 days supply | Qty: 30 | Fill #0

## 2017-08-25 MED FILL — ONDANSETRON HCL 4 MG TABLET: 4 | 8 days supply | Qty: 30 | Fill #0

## 2017-09-02 ENCOUNTER — Other Ambulatory Visit: Payer: Self-pay | Admitting: *Deleted

## 2017-09-02 DIAGNOSIS — M5442 Lumbago with sciatica, left side: Secondary | ICD-10-CM

## 2017-09-02 DIAGNOSIS — M5441 Lumbago with sciatica, right side: Secondary | ICD-10-CM

## 2017-09-02 DIAGNOSIS — C61 Malignant neoplasm of prostate: Secondary | ICD-10-CM

## 2017-09-02 DIAGNOSIS — C7951 Secondary malignant neoplasm of bone: Principal | ICD-10-CM

## 2017-09-02 DIAGNOSIS — G8929 Other chronic pain: Secondary | ICD-10-CM

## 2017-09-02 MED ORDER — FENTANYL 50 MCG/HR TD PT72: 50.0000 ug | MEDICATED_PATCH | TRANSDERMAL | 0 refills | Status: DC

## 2017-09-02 MED ORDER — FENTANYL 100 MCG/HR TD PT72: 100.0000 ug | MEDICATED_PATCH | TRANSDERMAL | 0 refills | Status: DC

## 2017-09-03 ENCOUNTER — Encounter: Payer: Self-pay | Admitting: Hematology & Oncology

## 2017-09-08 ENCOUNTER — Other Ambulatory Visit: Payer: Self-pay | Admitting: Hematology & Oncology

## 2017-09-14 ENCOUNTER — Other Ambulatory Visit: Payer: Self-pay | Admitting: *Deleted

## 2017-09-14 DIAGNOSIS — C61 Malignant neoplasm of prostate: Secondary | ICD-10-CM

## 2017-09-14 DIAGNOSIS — R52 Pain, unspecified: Secondary | ICD-10-CM

## 2017-09-14 MED ORDER — OXYCODONE-ACETAMINOPHEN 10-325 MG PO TABS: 1.0000 | ORAL_TABLET | Freq: Four times a day (QID) | ORAL | 0 refills | Status: DC | PRN

## 2017-09-16 ENCOUNTER — Inpatient Hospital Stay: Payer: Medicaid Other

## 2017-09-16 ENCOUNTER — Inpatient Hospital Stay: Payer: Medicaid Other | Attending: Hematology & Oncology | Admitting: Family

## 2017-09-18 ENCOUNTER — Other Ambulatory Visit: Payer: Self-pay | Admitting: *Deleted

## 2017-09-18 DIAGNOSIS — G8929 Other chronic pain: Secondary | ICD-10-CM

## 2017-09-18 DIAGNOSIS — C7951 Secondary malignant neoplasm of bone: Principal | ICD-10-CM

## 2017-09-18 DIAGNOSIS — M5442 Lumbago with sciatica, left side: Secondary | ICD-10-CM

## 2017-09-18 DIAGNOSIS — C61 Malignant neoplasm of prostate: Secondary | ICD-10-CM

## 2017-09-18 DIAGNOSIS — M5441 Lumbago with sciatica, right side: Secondary | ICD-10-CM

## 2017-09-18 MED ORDER — FENTANYL 50 MCG/HR TD PT72: 50.0000 ug | MEDICATED_PATCH | TRANSDERMAL | 0 refills | Status: DC

## 2017-09-18 MED ORDER — FENTANYL 100 MCG/HR TD PT72: 100.0000 ug | MEDICATED_PATCH | TRANSDERMAL | 0 refills | Status: DC

## 2017-09-22 ENCOUNTER — Other Ambulatory Visit: Payer: Self-pay | Admitting: Hematology & Oncology

## 2017-09-22 DIAGNOSIS — C61 Malignant neoplasm of prostate: Secondary | ICD-10-CM

## 2017-09-22 MED FILL — TAMSULOSIN HCL 0.4 MG CAP: 0.4 | 30 days supply | Qty: 30 | Fill #0

## 2017-10-02 ENCOUNTER — Inpatient Hospital Stay: Payer: Medicaid Other | Attending: Hematology & Oncology

## 2017-10-02 ENCOUNTER — Other Ambulatory Visit: Payer: Self-pay | Admitting: *Deleted

## 2017-10-02 ENCOUNTER — Other Ambulatory Visit: Payer: Self-pay | Admitting: Family

## 2017-10-02 ENCOUNTER — Telehealth: Payer: Self-pay | Admitting: *Deleted

## 2017-10-02 DIAGNOSIS — N390 Urinary tract infection, site not specified: Secondary | ICD-10-CM | POA: Insufficient documentation

## 2017-10-02 DIAGNOSIS — G8929 Other chronic pain: Secondary | ICD-10-CM

## 2017-10-02 DIAGNOSIS — M5441 Lumbago with sciatica, right side: Secondary | ICD-10-CM

## 2017-10-02 DIAGNOSIS — N3000 Acute cystitis without hematuria: Secondary | ICD-10-CM

## 2017-10-02 DIAGNOSIS — C61 Malignant neoplasm of prostate: Secondary | ICD-10-CM

## 2017-10-02 DIAGNOSIS — G629 Polyneuropathy, unspecified: Secondary | ICD-10-CM | POA: Diagnosis not present

## 2017-10-02 DIAGNOSIS — Z79899 Other long term (current) drug therapy: Secondary | ICD-10-CM | POA: Insufficient documentation

## 2017-10-02 DIAGNOSIS — R634 Abnormal weight loss: Secondary | ICD-10-CM | POA: Diagnosis not present

## 2017-10-02 DIAGNOSIS — R0602 Shortness of breath: Secondary | ICD-10-CM | POA: Diagnosis not present

## 2017-10-02 DIAGNOSIS — R232 Flushing: Secondary | ICD-10-CM | POA: Diagnosis not present

## 2017-10-02 DIAGNOSIS — C7951 Secondary malignant neoplasm of bone: Secondary | ICD-10-CM | POA: Insufficient documentation

## 2017-10-02 DIAGNOSIS — Z5111 Encounter for antineoplastic chemotherapy: Secondary | ICD-10-CM | POA: Insufficient documentation

## 2017-10-02 DIAGNOSIS — M5442 Lumbago with sciatica, left side: Secondary | ICD-10-CM

## 2017-10-02 LAB — URINALYSIS, COMPLETE (UACMP) WITH MICROSCOPIC
Bilirubin Urine: NEGATIVE
GLUCOSE, UA: NEGATIVE mg/dL
HGB URINE DIPSTICK: NEGATIVE
KETONES UR: NEGATIVE mg/dL
NITRITE: NEGATIVE
PH: 8.5 — AB (ref 5.0–8.0)
Protein, ur: NEGATIVE mg/dL
RBC / HPF: NONE SEEN RBC/hpf (ref 0–5)
Specific Gravity, Urine: 1.01 (ref 1.005–1.030)

## 2017-10-02 MED ORDER — SULFAMETHOXAZOLE-TRIMETHOPRIM 800-160 MG PO TABS: 1.0000 | ORAL_TABLET | Freq: Two times a day (BID) | ORAL | 0 refills | Status: DC

## 2017-10-02 MED ORDER — FENTANYL 100 MCG/HR TD PT72: 100.0000 ug | MEDICATED_PATCH | TRANSDERMAL | 0 refills | Status: DC

## 2017-10-02 MED ORDER — FENTANYL 50 MCG/HR TD PT72: 50.0000 ug | MEDICATED_PATCH | TRANSDERMAL | 0 refills | Status: DC

## 2017-10-02 NOTE — Telephone Encounter (Signed)
Call received from patient requesting a refill on Fentanyl patches and also states that he is having burning with urination.  Pt instructed to come in today for urinalysis and that patches will be sent to [facility] as requested by patient.

## 2017-10-02 NOTE — Telephone Encounter (Signed)
Pt notified that prescription for Bactrim DS has been sent to [facility] on [address] for his UTI.  Pt appreciative of call and understands to start antibiotic today.

## 2017-10-03 LAB — URINE CULTURE: CULTURE: NO GROWTH

## 2017-10-05 ENCOUNTER — Other Ambulatory Visit: Payer: Self-pay | Admitting: *Deleted

## 2017-10-05 ENCOUNTER — Other Ambulatory Visit: Payer: Self-pay | Admitting: Family

## 2017-10-05 ENCOUNTER — Telehealth: Payer: Self-pay | Admitting: *Deleted

## 2017-10-05 DIAGNOSIS — N3000 Acute cystitis without hematuria: Secondary | ICD-10-CM

## 2017-10-05 MED ORDER — SULFAMETHOXAZOLE-TRIMETHOPRIM 800-160 MG PO TABS: 1.0000 | ORAL_TABLET | Freq: Two times a day (BID) | ORAL | 0 refills | Status: DC

## 2017-10-05 NOTE — Telephone Encounter (Addendum)
Patient is aware of results and new prescription. Pharmacy confirmed.   ----- Message from Eliezer Bottom, NP sent at 10/02/2017  2:26 PM EDT ----- Regarding: UTI Prescription sent to his [facility] for Bactrim DS PO BID for 7 days. Thank you!!!!!  Judson Roch  ----- Message ----- From: Buel Ream, Lab In [place] Sent: 10/02/2017   2:07 PM EDT To: Eliezer Bottom, NP

## 2017-10-06 ENCOUNTER — Other Ambulatory Visit: Payer: Self-pay | Admitting: Hematology & Oncology

## 2017-10-08 ENCOUNTER — Other Ambulatory Visit: Payer: Self-pay

## 2017-10-08 ENCOUNTER — Inpatient Hospital Stay (HOSPITAL_BASED_OUTPATIENT_CLINIC_OR_DEPARTMENT_OTHER): Payer: Medicaid Other | Admitting: Family

## 2017-10-08 ENCOUNTER — Encounter: Payer: Self-pay | Admitting: Family

## 2017-10-08 ENCOUNTER — Inpatient Hospital Stay: Payer: Medicaid Other

## 2017-10-08 ENCOUNTER — Other Ambulatory Visit: Payer: Medicaid Other

## 2017-10-08 VITALS — BP 117/76 | HR 69 | Temp 98.3°F | Resp 16 | Wt 157.0 lb

## 2017-10-08 DIAGNOSIS — C61 Malignant neoplasm of prostate: Secondary | ICD-10-CM | POA: Diagnosis not present

## 2017-10-08 DIAGNOSIS — R630 Anorexia: Secondary | ICD-10-CM

## 2017-10-08 DIAGNOSIS — R0602 Shortness of breath: Secondary | ICD-10-CM

## 2017-10-08 DIAGNOSIS — R232 Flushing: Secondary | ICD-10-CM

## 2017-10-08 DIAGNOSIS — G8929 Other chronic pain: Secondary | ICD-10-CM

## 2017-10-08 DIAGNOSIS — N390 Urinary tract infection, site not specified: Secondary | ICD-10-CM

## 2017-10-08 DIAGNOSIS — R634 Abnormal weight loss: Secondary | ICD-10-CM

## 2017-10-08 DIAGNOSIS — D500 Iron deficiency anemia secondary to blood loss (chronic): Secondary | ICD-10-CM

## 2017-10-08 DIAGNOSIS — M5441 Lumbago with sciatica, right side: Secondary | ICD-10-CM

## 2017-10-08 DIAGNOSIS — C7951 Secondary malignant neoplasm of bone: Secondary | ICD-10-CM | POA: Diagnosis not present

## 2017-10-08 DIAGNOSIS — M5442 Lumbago with sciatica, left side: Secondary | ICD-10-CM

## 2017-10-08 DIAGNOSIS — Z5111 Encounter for antineoplastic chemotherapy: Secondary | ICD-10-CM | POA: Diagnosis not present

## 2017-10-08 LAB — CMP (CANCER CENTER ONLY)
ALK PHOS: 85 U/L — AB (ref 26–84)
ALT: 20 U/L (ref 10–47)
ANION GAP: 3 — AB (ref 5–15)
AST: 18 U/L (ref 11–38)
Albumin: 4.3 g/dL (ref 3.5–5.0)
BUN: 7 mg/dL (ref 7–22)
CALCIUM: 9.3 mg/dL (ref 8.0–10.3)
CO2: 26 mmol/L (ref 18–33)
Chloride: 106 mmol/L (ref 98–108)
Creatinine: 0.9 mg/dL (ref 0.60–1.20)
GLUCOSE: 109 mg/dL (ref 73–118)
POTASSIUM: 3.5 mmol/L (ref 3.3–4.7)
Sodium: 135 mmol/L (ref 128–145)
TOTAL PROTEIN: 7.4 g/dL (ref 6.4–8.1)
Total Bilirubin: 0.5 mg/dL (ref 0.2–1.6)

## 2017-10-08 LAB — SAMPLE TO BLOOD BANK

## 2017-10-08 LAB — CBC WITH DIFFERENTIAL (CANCER CENTER ONLY)
BASOS ABS: 0 10*3/uL (ref 0.0–0.1)
Basophils Relative: 0 %
EOS PCT: 3 %
Eosinophils Absolute: 0.2 10*3/uL (ref 0.0–0.5)
HCT: 38.5 % — ABNORMAL LOW (ref 38.7–49.9)
Hemoglobin: 12.8 g/dL — ABNORMAL LOW (ref 13.0–17.1)
LYMPHS ABS: 1.5 10*3/uL (ref 0.9–3.3)
LYMPHS PCT: 20 %
MCH: 30 pg (ref 28.0–33.4)
MCHC: 33.2 g/dL (ref 32.0–35.9)
MCV: 90.4 fL (ref 82.0–98.0)
Monocytes Absolute: 0.8 10*3/uL (ref 0.1–0.9)
Monocytes Relative: 10 %
NEUTROS ABS: 5 10*3/uL (ref 1.5–6.5)
Neutrophils Relative %: 67 %
PLATELETS: 195 10*3/uL (ref 145–400)
RBC: 4.26 MIL/uL (ref 4.20–5.70)
RDW: 14.9 % (ref 11.1–15.7)
WBC: 7.6 10*3/uL (ref 4.0–10.0)

## 2017-10-08 MED ORDER — ZOLEDRONIC ACID 4 MG/100ML IV SOLN
4.0000 mg | Freq: Once | INTRAVENOUS | Status: AC
  Administered 2017-10-08: 4 mg via INTRAVENOUS
  Filled 2017-10-08: qty 100

## 2017-10-08 MED ORDER — LEUPROLIDE ACETATE (3 MONTH) 11.25 MG IM KIT
11.2500 mg | PACK | Freq: Once | INTRAMUSCULAR | Status: AC
  Administered 2017-10-08: 11.25 mg via INTRAMUSCULAR
  Filled 2017-10-08: qty 11.25

## 2017-10-08 MED ORDER — DRONABINOL 5 MG PO CAPS: 5.0000 mg | ORAL_CAPSULE | Freq: Two times a day (BID) | ORAL | 2 refills | Status: DC

## 2017-10-08 NOTE — Progress Notes (Signed)
Hematology and Oncology Follow Up Visit  Ryan Davis [id number] October 20, 1960 57 y.o. 10/08/2017   Principle Diagnosis:  Metastatic prostate cancer - androgen sensitive  Past Therapy: 14 fractions of radiation to the lumbar spine Casodex 50 mg by mouth Davis- stopped 04/02/2017 due to progression Zytiga 1000 mg by mouth Davis/prednisone 10 mg by mouth Davis - started on 04/20/2016- d/c on 04/02/2017 Xtandi160 mg po q day - startedon 04/06/2017-- d/c on 06/24/2017  Current Therapy:   Lupron 11.5 mg IM every 3 month  Zometa 4 mg IV q3 months Flutamide 250 mg po q8hr - started 07/08/2017   Interim History: Ryan Davis is here today for follow-up. He come in last week c/o urinary frequency and back pain and was diagnosed with a UTI. He is currently on Bactrim DS BID and his symptoms have improved. He has been having hot flashes and night sweats off and on.  He has occasional SOB with over exertion and will take a break to rest as needed.  No fever, chills, cough, rash, dizziness, chest pain or palpitations.  He has had some constipation and will have abdominal discomfort and nausea (no vomiting) until his bowels move. He is using Miralax and prune juice as needed which he states is effective.  No episodes of bleeding, no bruising or petechiae.  No swelling in his extremities a this time. The neuropathy in his feet is stable and has not effected his gait. He has no falls or syncopal episodes to report.  No lymphadenopathy noted on exam.  He would like something to increase his appetite. He is drinking Boost several times a day and states that he is staying well hydrated. His weight is up 5 lbs since his last visit.   ECOG Performance Status: 1 - Symptomatic but completely ambulatory  Medications:  Allergies as of 10/08/2017      Reactions   Contrast Media [iodinated Diagnostic Agents] Nausea And Vomiting   Patient vomits immediately after IV contrast is injected just prior to start  of scan, was unable to scan patient in correct time frame.  Patient did not warn me of this when dosing the patient with oral CM and asking the patient history questions until the patient was on the table for the CT scan just prior to injection.  Ct Tech:  Karl Bales 07/29/17       Medication List        Accurate as of 10/08/17 11:28 AM. Always use your most recent med list.          CALCIUM-VITAMIN D3 PO Take 1 tablet by mouth Davis.   diltiazem 240 MG 24 hr capsule Commonly known as:  CARDIZEM CD TAKE 1 CAPSULE BY MOUTH EVERY DAY   diltiazem 240 MG 24 hr capsule Commonly known as:  CARDIZEM CD TAKE 1 CAPSULE BY MOUTH EVERY DAY   fentaNYL 100 MCG/HR Commonly known as:  DURAGESIC - dosed mcg/hr Place 1 patch (100 mcg total) onto the skin every 3 (three) days. Place with 21mcg patch for total of 155mcg.   fentaNYL 50 MCG/HR Commonly known as:  DURAGESIC - dosed mcg/hr Place 1 patch (50 mcg total) onto the skin every 3 (three) days. Place with 183mcg patch for total of 155mcg.   flutamide 125 MG capsule Commonly known as:  EULEXIN Take 2 capsules (250 mg total) by mouth 3 (three) times Davis.   megestrol 625 MG/5ML suspension Commonly known as:  MEGACE ES Take 5 mLs (625 mg total) by mouth  2 (two) times Davis.   metoprolol tartrate 25 MG tablet Commonly known as:  LOPRESSOR TAKE 1/2 TABLET(12.5 MG) BY MOUTH TWICE Davis   ondansetron 4 MG disintegrating tablet Commonly known as:  ZOFRAN-ODT Take 1 tablet (4 mg total) by mouth every 8 (eight) hours as needed for nausea or vomiting.   ondansetron 4 MG tablet Commonly known as:  ZOFRAN Take 1 tablet (4 mg total) by mouth every 6 (six) hours as needed for nausea or vomiting.   oxyCODONE-acetaminophen 10-325 MG tablet Commonly known as:  PERCOCET Take 1 tablet by mouth every 6 (six) hours as needed for pain.   sulfamethoxazole-trimethoprim 800-160 MG tablet Commonly known as:  BACTRIM DS,SEPTRA DS Take 1 tablet by  mouth 2 (two) times Davis.   tamsulosin 0.4 MG Caps capsule Commonly known as:  FLOMAX TAKE 1 CAPSULE (0.4 MG TOTAL) BY MOUTH Davis.       Allergies:  Allergies  Allergen Reactions  . Contrast Media [Iodinated Diagnostic Agents] Nausea And Vomiting    Patient vomits immediately after IV contrast is injected just prior to start of scan, was unable to scan patient in correct time frame.  Patient did not warn me of this when dosing the patient with oral CM and asking the patient history questions until the patient was on the table for the CT scan just prior to injection.  Ct Tech:  Karl Bales 07/29/17     Past Medical History, Surgical history, Social history, and Family History were reviewed and updated.  Review of Systems: All other 10 point review of systems is negative.   Physical Exam:  vitals were not taken for this visit.   Wt Readings from Last 3 Encounters:  08/19/17 152 lb 8 oz (69.2 kg)  07/29/17 148 lb (67.1 kg)  07/08/17 148 lb (67.1 kg)    Ocular: Sclerae unicteric, pupils equal, round and reactive to light Ear-nose-throat: Oropharynx clear, dentition fair Lymphatic: No cervical, supraclavicular or axillary adenopathy Lungs no rales or rhonchi, good excursion bilaterally Heart regular rate and rhythm, no murmur appreciated Abd soft, nontender, positive bowel sounds, no liver or spleen tip palpated on exam, no fluid wave  MSK no focal spinal tenderness, no joint edema Neuro: non-focal, well-oriented, appropriate affect Breasts: Deferred   Lab Results  Component Value Date   WBC 8.0 08/19/2017   HGB 13.7 08/19/2017   HCT 41.2 08/19/2017   MCV 91.2 08/19/2017   PLT 200 08/19/2017   Lab Results  Component Value Date   FERRITIN 269 08/19/2017   IRON 111 08/19/2017   TIBC 236 08/19/2017   UIBC 125 08/19/2017   IRONPCTSAT 47 08/19/2017   Lab Results  Component Value Date   RBC 4.52 08/19/2017   Lab Results  Component Value Date   KAPLAMBRATIO 0.89  03/28/2016   Lab Results  Component Value Date   IGGSERUM 1,066 03/28/2016   IGMSERUM 106 03/28/2016   No results found for: Odetta Pink, SPEI   Chemistry      Component Value Date/Time   NA 142 08/19/2017 1111   NA 143 12/16/2016 1022   K 3.3 08/19/2017 1111   K 3.5 12/16/2016 1022   CL 108 08/19/2017 1111   CL 101 12/16/2016 1022   CO2 28 08/19/2017 1111   CO2 33 12/16/2016 1022   BUN 6 (L) 08/19/2017 1111   BUN 4 (L) 12/16/2016 1022   CREATININE 0.90 08/19/2017 1111   CREATININE 0.8 12/16/2016 1022  Component Value Date/Time   CALCIUM 9.7 08/19/2017 1111   CALCIUM 9.4 12/16/2016 1022   ALKPHOS 118 (H) 08/19/2017 1111   ALKPHOS 77 12/16/2016 1022   AST 17 08/19/2017 1111   ALT 15 08/19/2017 1111   ALT 16 12/16/2016 1022   BILITOT 0.7 08/19/2017 1111      Impression and Plan: Ryan Davis is a very pleasant 57 yo African American gentleman with metastatic prostate cancer. His PSA in July was down to 9.3. He did have a break on Flutamide due to a shortage but has since restarted and is tolerating this well.  He will get Lupron and Zometa today.  We will see what the rest of his lab work shows. If PSA elevated we will look at ordering another PET scan per Dr. Arelia Sneddon.  He will try taking Marinol BID and see if this helps with his appetite.  We will plan to see him back in another month for MD follow-up.  He will contact our office with any questions or concerns. We can certainly see him sooner if need be.   Laverna Peace, NP 9/12/201911:28 AM

## 2017-10-08 NOTE — Patient Instructions (Signed)
Zoledronic Acid injection (Hypercalcemia, Oncology) What is this medicine? ZOLEDRONIC ACID (ZOE le dron ik AS id) lowers the amount of calcium loss from bone. It is used to treat too much calcium in your blood from cancer. It is also used to prevent complications of cancer that has spread to the bone. This medicine may be used for other purposes; ask your health care provider or pharmacist if you have questions. COMMON BRAND NAME(S): Zometa What should I tell my health care provider before I take this medicine? They need to know if you have any of these conditions: -aspirin-sensitive asthma -cancer, especially if you are receiving medicines used to treat cancer -dental disease or wear dentures -infection -kidney disease -receiving corticosteroids like dexamethasone or prednisone -an unusual or allergic reaction to zoledronic acid, other medicines, foods, dyes, or preservatives -pregnant or trying to get pregnant -breast-feeding How should I use this medicine? This medicine is for infusion into a vein. It is given by a health care professional in a hospital or clinic setting. Talk to your pediatrician regarding the use of this medicine in children. Special care may be needed. Overdosage: If you think you have taken too much of this medicine contact a poison control center or emergency room at once. NOTE: This medicine is only for you. Do not share this medicine with others. What if I miss a dose? It is important not to miss your dose. Call your doctor or health care professional if you are unable to keep an appointment. What may interact with this medicine? -certain antibiotics given by injection -NSAIDs, medicines for pain and inflammation, like ibuprofen or naproxen -some diuretics like bumetanide, furosemide -teriparatide -thalidomide This list may not describe all possible interactions. Give your health care provider a list of all the medicines, herbs, non-prescription drugs, or  dietary supplements you use. Also tell them if you smoke, drink alcohol, or use illegal drugs. Some items may interact with your medicine. What should I watch for while using this medicine? Visit your doctor or health care professional for regular checkups. It may be some time before you see the benefit from this medicine. Do not stop taking your medicine unless your doctor tells you to. Your doctor may order blood tests or other tests to see how you are doing. Women should inform their doctor if they wish to become pregnant or think they might be pregnant. There is a potential for serious side effects to an unborn child. Talk to your health care professional or pharmacist for more information. You should make sure that you get enough calcium and vitamin D while you are taking this medicine. Discuss the foods you eat and the vitamins you take with your health care professional. Some people who take this medicine have severe bone, joint, and/or muscle pain. This medicine may also increase your risk for jaw problems or a broken thigh bone. Tell your doctor right away if you have severe pain in your jaw, bones, joints, or muscles. Tell your doctor if you have any pain that does not go away or that gets worse. Tell your dentist and dental surgeon that you are taking this medicine. You should not have major dental surgery while on this medicine. See your dentist to have a dental exam and fix any dental problems before starting this medicine. Take good care of your teeth while on this medicine. Make sure you see your dentist for regular follow-up appointments. What side effects may I notice from receiving this medicine? Side effects that   you should report to your doctor or health care professional as soon as possible: -allergic reactions like skin rash, itching or hives, swelling of the face, lips, or tongue -anxiety, confusion, or depression -breathing problems -changes in vision -eye pain -feeling faint or  lightheaded, falls -jaw pain, especially after dental work -mouth sores -muscle cramps, stiffness, or weakness -redness, blistering, peeling or loosening of the skin, including inside the mouth -trouble passing urine or change in the amount of urine Side effects that usually do not require medical attention (report to your doctor or health care professional if they continue or are bothersome): -bone, joint, or muscle pain -constipation -diarrhea -fever -hair loss -irritation at site where injected -loss of appetite -nausea, vomiting -stomach upset -trouble sleeping -trouble swallowing -weak or tired This list may not describe all possible side effects. Call your doctor for medical advice about side effects. You may report side effects to FDA at 1-800-FDA-1088. Where should I keep my medicine? This drug is given in a hospital or clinic and will not be stored at home. NOTE: This sheet is a summary. It may not cover all possible information. If you have questions about this medicine, talk to your doctor, pharmacist, or health care provider.  2018 Elsevier/Gold Standard (2013-06-11 14:19:39)  Leuprolide injection What is this medicine? LEUPROLIDE (loo PROE lide) is a man-made hormone. It is used to treat the symptoms of prostate cancer. This medicine may also be used to treat children with early onset of puberty. It may be used for other hormonal conditions. This medicine may be used for other purposes; ask your health care provider or pharmacist if you have questions. COMMON BRAND NAME(S): Lupron What should I tell my health care provider before I take this medicine? They need to know if you have any of these conditions: -diabetes -heart disease or previous heart attack -high blood pressure -high cholesterol -pain or difficulty passing urine -spinal cord metastasis -stroke -tobacco smoker -an unusual or allergic reaction to leuprolide, benzyl alcohol, other medicines, foods,  dyes, or preservatives -pregnant or trying to get pregnant -breast-feeding How should I use this medicine? This medicine is for injection under the skin or into a muscle. You will be taught how to prepare and give this medicine. Use exactly as directed. Take your medicine at regular intervals. Do not take your medicine more often than directed. It is important that you put your used needles and syringes in a special sharps container. Do not put them in a trash can. If you do not have a sharps container, call your pharmacist or healthcare provider to get one. A special MedGuide will be given to you by the pharmacist with each prescription and refill. Be sure to read this information carefully each time. Talk to your pediatrician regarding the use of this medicine in children. While this medicine may be prescribed for children as young as 8 years for selected conditions, precautions do apply. Overdosage: If you think you have taken too much of this medicine contact a poison control center or emergency room at once. NOTE: This medicine is only for you. Do not share this medicine with others. What if I miss a dose? If you miss a dose, take it as soon as you can. If it is almost time for your next dose, take only that dose. Do not take double or extra doses. What may interact with this medicine? Do not take this medicine with any of the following medications: -chasteberry This medicine may also interact with   the following medications: -herbal or dietary supplements, like black cohosh or DHEA -male hormones, like estrogens or progestins and birth control pills, patches, rings, or injections -male hormones, like testosterone This list may not describe all possible interactions. Give your health care provider a list of all the medicines, herbs, non-prescription drugs, or dietary supplements you use. Also tell them if you smoke, drink alcohol, or use illegal drugs. Some items may interact with your  medicine. What should I watch for while using this medicine? Visit your doctor or health care professional for regular checks on your progress. During the first week, your symptoms may get worse, but then will improve as you continue your treatment. You may get hot flashes, increased bone pain, increased difficulty passing urine, or an aggravation of nerve symptoms. Discuss these effects with your doctor or health care professional, some of them may improve with continued use of this medicine. Male patients may experience a menstrual cycle or spotting during the first 2 months of therapy with this medicine. If this continues, contact your doctor or health care professional. What side effects may I notice from receiving this medicine? Side effects that you should report to your doctor or health care professional as soon as possible: -allergic reactions like skin rash, itching or hives, swelling of the face, lips, or tongue -breathing problems -chest pain -depression or memory disorders -pain in your legs or groin -pain at site where injected -severe headache -swelling of the feet and legs -visual changes -vomiting Side effects that usually do not require medical attention (report to your doctor or health care professional if they continue or are bothersome): -breast swelling or tenderness -decrease in sex drive or performance -diarrhea -hot flashes -loss of appetite -muscle, joint, or bone pains -nausea -redness or irritation at site where injected -skin problems or acne This list may not describe all possible side effects. Call your doctor for medical advice about side effects. You may report side effects to FDA at 1-800-FDA-1088. Where should I keep my medicine? Keep out of the reach of children. Store below 25 degrees C (77 degrees F). Do not freeze. Protect from light. Do not use if it is not clear or if there are particles present. Throw away any unused medicine after the  expiration date. NOTE: This sheet is a summary. It may not cover all possible information. If you have questions about this medicine, talk to your doctor, pharmacist, or health care provider.  2018 Elsevier/Gold Standard (2015-09-03 10:54:35)  

## 2017-10-09 LAB — FERRITIN: Ferritin: 241 ng/mL (ref 24–336)

## 2017-10-09 LAB — IRON AND TIBC
Iron: 84 ug/dL (ref 42–163)
SATURATION RATIOS: 35 % — AB (ref 42–163)
TIBC: 240 ug/dL (ref 202–409)
UIBC: 156 ug/dL

## 2017-10-09 LAB — PROSTATE-SPECIFIC AG, SERUM (LABCORP): Prostate Specific Ag, Serum: 0.7 ng/mL (ref 0.0–4.0)

## 2017-10-09 LAB — TESTOSTERONE: Testosterone: 3 ng/dL — ABNORMAL LOW (ref 264–916)

## 2017-10-15 ENCOUNTER — Other Ambulatory Visit: Payer: Self-pay | Admitting: *Deleted

## 2017-10-15 DIAGNOSIS — R52 Pain, unspecified: Secondary | ICD-10-CM

## 2017-10-15 DIAGNOSIS — C61 Malignant neoplasm of prostate: Secondary | ICD-10-CM

## 2017-10-15 MED ORDER — OXYCODONE-ACETAMINOPHEN 10-325 MG PO TABS: 1.0000 | ORAL_TABLET | Freq: Four times a day (QID) | ORAL | 0 refills | Status: DC | PRN

## 2017-10-20 ENCOUNTER — Other Ambulatory Visit: Payer: Self-pay

## 2017-10-20 ENCOUNTER — Encounter (HOSPITAL_BASED_OUTPATIENT_CLINIC_OR_DEPARTMENT_OTHER): Payer: Self-pay | Admitting: *Deleted

## 2017-10-20 ENCOUNTER — Other Ambulatory Visit: Payer: Self-pay | Admitting: Hematology & Oncology

## 2017-10-20 ENCOUNTER — Emergency Department (HOSPITAL_BASED_OUTPATIENT_CLINIC_OR_DEPARTMENT_OTHER)
Admission: EM | Admit: 2017-10-20 | Discharge: 2017-10-20 | Disposition: A | Discharge status: Home / Self Care | Payer: Medicaid Other | Attending: Emergency Medicine | Admitting: Emergency Medicine

## 2017-10-20 DIAGNOSIS — Z79899 Other long term (current) drug therapy: Secondary | ICD-10-CM | POA: Diagnosis not present

## 2017-10-20 DIAGNOSIS — F17210 Nicotine dependence, cigarettes, uncomplicated: Secondary | ICD-10-CM | POA: Insufficient documentation

## 2017-10-20 DIAGNOSIS — R197 Diarrhea, unspecified: Secondary | ICD-10-CM | POA: Diagnosis not present

## 2017-10-20 DIAGNOSIS — C61 Malignant neoplasm of prostate: Secondary | ICD-10-CM

## 2017-10-20 DIAGNOSIS — R112 Nausea with vomiting, unspecified: Secondary | ICD-10-CM | POA: Diagnosis present

## 2017-10-20 DIAGNOSIS — E860 Dehydration: Secondary | ICD-10-CM | POA: Diagnosis not present

## 2017-10-20 LAB — COMPREHENSIVE METABOLIC PANEL
ALT: 13 U/L (ref 0–44)
AST: 15 U/L (ref 15–41)
Albumin: 4.4 g/dL (ref 3.5–5.0)
Alkaline Phosphatase: 67 U/L (ref 38–126)
Anion gap: 9 (ref 5–15)
BUN: 8 mg/dL (ref 6–20)
CO2: 26 mmol/L (ref 22–32)
Calcium: 8.6 mg/dL — ABNORMAL LOW (ref 8.9–10.3)
Chloride: 102 mmol/L (ref 98–111)
Creatinine, Ser: 0.63 mg/dL (ref 0.61–1.24)
GFR calc Af Amer: >60 mL/min (ref >60)
GFR calc non Af Amer: >60 mL/min (ref >60)
Glucose, Bld: 115 mg/dL — ABNORMAL HIGH (ref 70–99)
Potassium: 3.5 mmol/L (ref 3.5–5.1)
Sodium: 137 mmol/L (ref 135–145)
Total Bilirubin: 0.5 mg/dL (ref 0.3–1.2)
Total Protein: 7.4 g/dL (ref 6.5–8.1)

## 2017-10-20 LAB — CBC
HCT: 40.4 % (ref 39.0–52.0)
Hemoglobin: 13.7 g/dL (ref 13.0–17.0)
MCH: 30.2 pg (ref 26.0–34.0)
MCHC: 33.9 g/dL (ref 30.0–36.0)
MCV: 89 fL (ref 78.0–100.0)
Platelets: 161 10*3/uL (ref 150–400)
RBC: 4.54 MIL/uL (ref 4.22–5.81)
RDW: 15 % (ref 11.5–15.5)
WBC: 11.9 10*3/uL — ABNORMAL HIGH (ref 4.0–10.5)

## 2017-10-20 LAB — LIPASE, BLOOD: Lipase: 19 U/L (ref 11–51)

## 2017-10-20 MED ORDER — ONDANSETRON HCL 4 MG/2ML IJ SOLN
4.0000 mg | Freq: Once | INTRAMUSCULAR | Status: AC
  Administered 2017-10-20: 4 mg via INTRAVENOUS
  Filled 2017-10-20: qty 2

## 2017-10-20 MED ORDER — SODIUM CHLORIDE 0.9 % IV BOLUS
1000.0000 mL | Freq: Once | INTRAVENOUS | Status: AC
  Administered 2017-10-20: 1000 mL via INTRAVENOUS

## 2017-10-20 MED ORDER — ONDANSETRON 8 MG PO TBDP: 8.0000 mg | ORAL_TABLET | Freq: Three times a day (TID) | ORAL | 0 refills | Status: DC | PRN

## 2017-10-20 MED FILL — TAMSULOSIN HCL 0.4 MG CAP: 0.4 | 30 days supply | Qty: 30 | Fill #0

## 2017-10-20 MED FILL — ONDANSETRON ODT 8 MG TABLET: 8 | 4 days supply | Qty: 10 | Fill #0

## 2017-10-20 NOTE — ED Provider Notes (Signed)
[facility] EMERGENCY DEPARTMENT Provider Note   CSN: [id number] Arrival date & time: 10/20/17  1021     History   Chief Complaint Chief Complaint  Patient presents with  . Nausea, Vomiting    HPI Ryan Davis is a 57 y.o. male.  HPI Patient is a 57 year old male presents the emergency department with nausea vomiting and diarrhea since last night.  Decreased oral intake today.  Reports some muscle cramps.  Denies recent sick contacts.  Chills without documented fever.  Denies focal abdominal pain and discomfort at this time.  No blood noted in his vomit or stool.  No other complaints at this time.   Past Medical History:  Diagnosis Date  . A-fib [facility])    see 03-28-16 admission  . Back pain without radiculopathy 03/28/2016  . Dyspnea    endorses since treatment radiation , still gets SOB occasionally   . Dysrhythmia    afib   . Goals of care, counseling/discussion 06/24/2017  . History of radiation therapy 09/16/16-10/06/16   35 Gy in 14 fractions to the lumbar spine  . Nocturia   . 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 Goals of care, counseling/discussion 06/24/2017  . 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 Atrial fibrillation ([place]) 03/30/2016  . Back pain without radiculopathy 03/28/2016  . Atrial fibrillation with RVR ([place]) 03/28/2016    Past Surgical History:  Procedure Laterality Date  . MULTIPLE EXTRACTIONS WITH ALVEOLOPLASTY N/A 11/19/2016   Procedure: EXTRACTION OF TOOTH #'S 1,3,5-7,9-17, AND 20- 29 WITH ALVEOLOPLASTY, BILATERAL MANDIBULAR TORI REDUCTIONS AND BILATERAL MAXILLARY BUCCAL EXOSTOSES REDUCTIONS;  Surgeon: Lenn Cal, DDS;  Location: [facility] ORS;  Service: Oral Surgery;  Laterality: N/A;  . NO PAST SURGERIES           Home Medications    Prior to Admission medications   Medication Sig Start Date End Date Taking? Authorizing Provider  bicalutamide (CASODEX) 50 MG tablet Take 50 mg by mouth.   Yes [provider]  Calcium Carbonate-Vitamin D (CALCIUM-VITAMIN D3 PO) Take 1 tablet by mouth daily.   Yes [provider]  diltiazem (CARDIZEM CD) 240 MG 24 hr capsule TAKE 1 CAPSULE BY MOUTH EVERY DAY 10/06/17  Yes Ennever, Rudell Cobb, MD  dronabinol (MARINOL) 5 MG capsule Take 1 capsule (5 mg total) by mouth 2 (two) times daily before a meal. 10/08/17  Yes [place], Holli Humbles, NP  fentaNYL (DURAGESIC - DOSED MCG/HR) 100 MCG/HR Place 1 patch (100 mcg total) onto the skin every 3 (three) days. Place with 56mcg patch for total of 167mcg. 10/02/17  Yes [place], Holli Humbles, NP  fentaNYL (DURAGESIC - DOSED MCG/HR) 50 MCG/HR Place 1 patch (50 mcg total) onto the skin every 3 (three) days. Place with 118mcg patch for total of 153mcg. 10/02/17  Yes [place], Holli Humbles, NP  flutamide (EULEXIN) 125 MG capsule Take 2 capsules (250 mg total) by mouth 3 (three) times daily. 08/19/17  Yes [name], MD  megestrol (MEGACE ES) 625 MG/5ML suspension Take 5 mLs (625 mg total) by mouth 2 (two) times daily. 05/29/17  Yes [name], MD  ondansetron (ZOFRAN) 4 MG tablet Take 1 tablet (4 mg total) by mouth every 6 (six) hours as needed for nausea or vomiting. 08/25/17  Yes  [name], MD  oxyCODONE-acetaminophen (PERCOCET) 10-325 MG tablet Take 1 tablet by mouth every 6 (six) hours as needed for pain. 10/15/17  Yes Ennever, Rudell Cobb, MD  tamsulosin (FLOMAX) 0.4 MG CAPS capsule TAKE 1 CAPSULE (0.4 MG TOTAL) BY MOUTH DAILY. 09/22/17  Yes Ennever, Rudell Cobb, MD  metoprolol tartrate (LOPRESSOR) 25 MG tablet TAKE 1/2 TABLET(12.5 MG) BY MOUTH TWICE DAILY 07/13/17   [name], MD  ondansetron (ZOFRAN ODT) 4 MG disintegrating tablet Take 1 tablet (4 mg total) by mouth every 8 (eight) hours as needed for nausea or  vomiting. 02/23/17   [place], Courteney Lyn, MD  sulfamethoxazole-trimethoprim (BACTRIM DS,SEPTRA DS) 800-160 MG tablet Take 1 tablet by mouth 2 (two) times daily. 10/05/17   [name], MD    Family History Family History  Problem Relation Age of Onset  . Stroke Sister   . Lupus Mother   . Cancer Neg Hx   . Heart disease Neg Hx   . Diabetes Neg Hx     Social History Social History   Tobacco Use  . Smoking status: Current Some Day Smoker    Packs/day: 0.25    Years: 39.00    Pack years: 9.75    Types: Cigarettes    Last attempt to quit: 10/13/2016    Years since quitting: 1.0  . Smokeless tobacco: Never Used  . Tobacco comment: 04/18/2016 still smokes; 11-17-16 states some day smoker   Substance Use Topics  . Alcohol use: No    Comment: has not drank in 7 months.  He used to drink 40oz  of beer daily  . Drug use: No     Allergies   Contrast media [iodinated diagnostic agents] and Other   Review of Systems Review of Systems  All other systems reviewed and are negative.    Physical Exam Updated Vital Signs BP 131/89   Pulse 69   Ht 6' 2.5" (1.892 m)   Wt 70.4 kg   SpO2 99%   BMI 19.66 kg/m   Physical Exam  Constitutional: He is oriented to person, place, and time. He appears well-developed and well-nourished.  HENT:  Head: Normocephalic and atraumatic.  Eyes: EOM are normal.  Neck: Normal range of motion.  Cardiovascular: Normal rate, regular rhythm, normal heart sounds and intact distal pulses.  Pulmonary/Chest: Effort normal and breath sounds normal. No respiratory distress.  Abdominal: Soft. He exhibits no distension. There is no tenderness.  Musculoskeletal: Normal range of motion.  Neurological: He is alert and oriented to person, place, and time.  Skin: Skin is warm and dry.  Psychiatric: He has a normal mood and affect. Judgment normal.  Nursing note and vitals reviewed.    ED Treatments / Results  Labs (all labs ordered are listed, but  only abnormal results are displayed) Labs Reviewed  CBC - Abnormal; Notable for the following components:      Result Value   WBC 11.9 (*)    All other components within normal limits  COMPREHENSIVE METABOLIC PANEL - Abnormal; Notable for the following components:   Glucose, Bld 115 (*)    Calcium 8.6 (*)    All other components within normal limits  LIPASE, BLOOD    EKG None  Radiology No results found.  Procedures Procedures (including critical care time)  Medications Ordered in ED Medications  sodium chloride 0.9 % bolus 1,000 mL (1,000 mLs Intravenous New Bag/Given 10/20/17 1131)  ondansetron (ZOFRAN) injection 4 mg (4 mg Intravenous Given 10/20/17 1135)  Initial Impression / Assessment and Plan / ED Course  I have reviewed the triage vital signs and the nursing notes.  Pertinent labs & imaging results that were available during my care of the patient were reviewed by me and considered in my medical decision making (see chart for details).     12:14 PM Patient is overall well-appearing.  Discharged home in good condition.  Feels better after IV fluids.  Home with antinausea medication.  Instructed to return to the ER for new or worsening symptoms.  Repeat abdominal exam demonstrates no tenderness.  No indication for advanced imaging.  Final Clinical Impressions(s) / ED Diagnoses   Final diagnoses:  None    ED Discharge Orders    None       Jola Schmidt, MD 10/20/17 1215

## 2017-10-20 NOTE — ED Triage Notes (Addendum)
Nausea and vomiting since last night and break out in sweats.  Abdominal and back pain and muscle cramps. Had the same problem 5 months ago

## 2017-10-28 ENCOUNTER — Other Ambulatory Visit: Payer: Self-pay | Admitting: *Deleted

## 2017-10-28 DIAGNOSIS — M5442 Lumbago with sciatica, left side: Secondary | ICD-10-CM

## 2017-10-28 DIAGNOSIS — C7951 Secondary malignant neoplasm of bone: Principal | ICD-10-CM

## 2017-10-28 DIAGNOSIS — G8929 Other chronic pain: Secondary | ICD-10-CM

## 2017-10-28 DIAGNOSIS — M5441 Lumbago with sciatica, right side: Secondary | ICD-10-CM

## 2017-10-28 DIAGNOSIS — C61 Malignant neoplasm of prostate: Secondary | ICD-10-CM

## 2017-10-28 MED ORDER — FENTANYL 100 MCG/HR TD PT72: 100.0000 ug | MEDICATED_PATCH | TRANSDERMAL | 0 refills | Status: DC

## 2017-10-28 MED ORDER — FENTANYL 50 MCG/HR TD PT72: 50.0000 ug | MEDICATED_PATCH | TRANSDERMAL | 0 refills | Status: DC

## 2017-11-04 ENCOUNTER — Encounter: Payer: Self-pay | Admitting: Hematology & Oncology

## 2017-11-04 ENCOUNTER — Inpatient Hospital Stay: Payer: Medicaid Other

## 2017-11-04 ENCOUNTER — Other Ambulatory Visit: Payer: Self-pay

## 2017-11-04 ENCOUNTER — Inpatient Hospital Stay: Payer: Medicaid Other | Attending: Hematology & Oncology | Admitting: Hematology & Oncology

## 2017-11-04 VITALS — BP 131/82 | HR 88 | Temp 97.9°F | Resp 20 | Wt 162.0 lb

## 2017-11-04 DIAGNOSIS — R232 Flushing: Secondary | ICD-10-CM

## 2017-11-04 DIAGNOSIS — Z79899 Other long term (current) drug therapy: Secondary | ICD-10-CM | POA: Insufficient documentation

## 2017-11-04 DIAGNOSIS — R630 Anorexia: Secondary | ICD-10-CM

## 2017-11-04 DIAGNOSIS — C7951 Secondary malignant neoplasm of bone: Secondary | ICD-10-CM | POA: Diagnosis not present

## 2017-11-04 DIAGNOSIS — R52 Pain, unspecified: Secondary | ICD-10-CM

## 2017-11-04 DIAGNOSIS — C61 Malignant neoplasm of prostate: Secondary | ICD-10-CM

## 2017-11-04 DIAGNOSIS — Z23 Encounter for immunization: Secondary | ICD-10-CM | POA: Diagnosis not present

## 2017-11-04 DIAGNOSIS — E291 Testicular hypofunction: Secondary | ICD-10-CM

## 2017-11-04 LAB — CBC WITH DIFFERENTIAL (CANCER CENTER ONLY)
ABS IMMATURE GRANULOCYTES: 0.03 10*3/uL (ref 0.00–0.07)
BASOS ABS: 0 10*3/uL (ref 0.0–0.1)
BASOS PCT: 0 %
EOS ABS: 0.2 10*3/uL (ref 0.0–0.5)
Eosinophils Relative: 3 %
HCT: 40.9 % (ref 39.0–52.0)
Hemoglobin: 12.9 g/dL — ABNORMAL LOW (ref 13.0–17.0)
IMMATURE GRANULOCYTES: 0 %
LYMPHS ABS: 1.6 10*3/uL (ref 0.7–4.0)
Lymphocytes Relative: 18 %
MCH: 29.1 pg (ref 26.0–34.0)
MCHC: 31.5 g/dL (ref 30.0–36.0)
MCV: 92.3 fL (ref 80.0–100.0)
Monocytes Absolute: 0.7 10*3/uL (ref 0.1–1.0)
Monocytes Relative: 8 %
NEUTROS ABS: 6.7 10*3/uL (ref 1.7–7.7)
NEUTROS PCT: 71 %
NRBC: 0 % (ref 0.0–0.2)
PLATELETS: 180 10*3/uL (ref 150–400)
RBC: 4.43 MIL/uL (ref 4.22–5.81)
RDW: 14.6 % (ref 11.5–15.5)
WBC Count: 9.3 10*3/uL (ref 4.0–10.5)

## 2017-11-04 LAB — CMP (CANCER CENTER ONLY)
ALT: 22 U/L (ref 10–47)
ANION GAP: 6 (ref 5–15)
AST: 21 U/L (ref 11–38)
Albumin: 4.1 g/dL (ref 3.5–5.0)
Alkaline Phosphatase: 70 U/L (ref 26–84)
BILIRUBIN TOTAL: 0.5 mg/dL (ref 0.2–1.6)
BUN: 12 mg/dL (ref 7–22)
CALCIUM: 9.5 mg/dL (ref 8.0–10.3)
CO2: 26 mmol/L (ref 18–33)
Chloride: 109 mmol/L — ABNORMAL HIGH (ref 98–108)
Creatinine: 0.8 mg/dL (ref 0.60–1.20)
GLUCOSE: 106 mg/dL (ref 73–118)
POTASSIUM: 4 mmol/L (ref 3.3–4.7)
Sodium: 141 mmol/L (ref 128–145)
TOTAL PROTEIN: 7.7 g/dL (ref 6.4–8.1)

## 2017-11-04 LAB — SAMPLE TO BLOOD BANK

## 2017-11-04 MED ORDER — INFLUENZA VAC SPLIT QUAD 0.5 ML IM SUSY
0.5000 mL | PREFILLED_SYRINGE | Freq: Once | INTRAMUSCULAR | Status: AC
  Administered 2017-11-04: 0.5 mL via INTRAMUSCULAR

## 2017-11-04 MED ORDER — INFLUENZA VAC SPLIT QUAD 0.5 ML IM SUSY
PREFILLED_SYRINGE | INTRAMUSCULAR | Status: AC
  Filled 2017-11-04: qty 0.5

## 2017-11-04 NOTE — Progress Notes (Signed)
Hematology and Oncology Follow Up Visit  Ryan Davis [id number] 1960/07/09 57 y.o. 11/04/2017   Principle Diagnosis:  Metastatic prostate cancer - androgen sensitive  Past Therapy: 14 fractions of radiation to the lumbar spine Casodex 50 mg by mouth daily- stopped 04/02/2017 due to progression Zytiga 1000 mg by mouth daily/prednisone 10 mg by mouth daily - started on 04/20/2016- d/c on 04/02/2017 Xtandi160 mg po q day - startedon 04/06/2017-- d/c on 06/24/2017  Current Therapy:   Lupron 11.5 mg IM every 3 month -- next dose Dec 2019 Zometa 4 mg IV q3 months -- next dose Dec 2019 Flutamide 250 mg po q8hr - started 07/08/2017   Interim History: Ryan Davis is here today for follow-up.  It is amazing how well he is doing.  His PSA was last checked it was down to 0.7.  4 months ago, his PSA was 520.  He is just on Lupron and flutamide.  I am just absolutely amazed as to how well he is doing.  His weight is going up.  He is eating better.  He is now able to take care of his poor wife.  She has a lot of vascular issues.  She is crippled.  His pain is doing pretty well.  He is on a fentanyl patch at 150 mcg.  He has had no problems with bowels or bladder.  He has had no leg swelling.  He has had no rashes.  He has had no bleeding.  Overall, his performance status is ECOG 1.  Medications:  Allergies as of 11/04/2017      Reactions   Contrast Media [iodinated Diagnostic Agents] Nausea And Vomiting   Patient vomits immediately after IV contrast is injected just prior to start of scan, was unable to scan patient in correct time frame.  Patient did not warn me of this when dosing the patient with oral CM and asking the patient history questions until the patient was on the table for the CT scan just prior to injection.  Ct Tech:  Ryan Davis 07/29/17    Other Nausea And Vomiting   Patient vomits immediately after IV contrast is injected just prior to start of scan, was unable to  scan patient in correct time frame.  Patient did not warn me of this when dosing the patient with oral CM and asking the patient history questions until the patient was on the table for the CT scan just prior to injection.  Ct Tech:  Ryan Davis 07/29/17       Medication List        Accurate as of 11/04/17 12:32 PM. Always use your most recent med list.          bicalutamide 50 MG tablet Commonly known as:  CASODEX Take 50 mg by mouth.   CALCIUM-VITAMIN D3 PO Take 1 tablet by mouth daily.   diltiazem 240 MG 24 hr capsule Commonly known as:  CARDIZEM CD TAKE 1 CAPSULE BY MOUTH EVERY DAY   dronabinol 5 MG capsule Commonly known as:  MARINOL Take 1 capsule (5 mg total) by mouth 2 (two) times daily before a meal.   fentaNYL 50 MCG/HR Commonly known as:  DURAGESIC - dosed mcg/hr Place 1 patch (50 mcg total) onto the skin every 3 (three) days. Place with 176mcg patch for total of 150mcg.   fentaNYL 100 MCG/HR Commonly known as:  DURAGESIC - dosed mcg/hr Place 1 patch (100 mcg total) onto the skin every 3 (three) days. Place with  41mcg patch for total of 150mcg.   flutamide 125 MG capsule Commonly known as:  EULEXIN Take 2 capsules (250 mg total) by mouth 3 (three) times daily.   megestrol 625 MG/5ML suspension Commonly known as:  MEGACE ES Take 5 mLs (625 mg total) by mouth 2 (two) times daily.   metoprolol tartrate 25 MG tablet Commonly known as:  LOPRESSOR TAKE 1/2 TABLET(12.5 MG) BY MOUTH TWICE DAILY   ondansetron 4 MG tablet Commonly known as:  ZOFRAN Take 1 tablet (4 mg total) by mouth every 6 (six) hours as needed for nausea or vomiting.   ondansetron 8 MG disintegrating tablet Commonly known as:  ZOFRAN-ODT Take 1 tablet (8 mg total) by mouth every 8 (eight) hours as needed for nausea or vomiting.   oxyCODONE-acetaminophen 10-325 MG tablet Commonly known as:  PERCOCET Take 1 tablet by mouth every 6 (six) hours as needed for pain.   tamsulosin 0.4 MG Caps  capsule Commonly known as:  FLOMAX TAKE 1 CAPSULE (0.4 MG TOTAL) BY MOUTH DAILY.       Allergies:  Allergies  Allergen Reactions  . Contrast Media [Iodinated Diagnostic Agents] Nausea And Vomiting    Patient vomits immediately after IV contrast is injected just prior to start of scan, was unable to scan patient in correct time frame.  Patient did not warn me of this when dosing the patient with oral CM and asking the patient history questions until the patient was on the table for the CT scan just prior to injection.  Ct Tech:  Ryan Davis 07/29/17   . Other Nausea And Vomiting    Patient vomits immediately after IV contrast is injected just prior to start of scan, was unable to scan patient in correct time frame.  Patient did not warn me of this when dosing the patient with oral CM and asking the patient history questions until the patient was on the table for the CT scan just prior to injection.  Ct Tech:  Ryan Davis 07/29/17     Past Medical History, Surgical history, Social history, and Family History were reviewed and updated.  Review of Systems: Review of Systems  Constitutional: Negative.   HENT: Negative.   Eyes: Negative.   Respiratory: Negative.   Cardiovascular: Negative.   Gastrointestinal: Negative.   Genitourinary: Negative.   Musculoskeletal: Positive for back pain.  Skin: Negative.   Neurological: Negative.   Endo/Heme/Allergies: Negative.   Psychiatric/Behavioral: Negative.      Physical Exam:  weight is 162 lb (73.5 kg). His oral temperature is 97.9 F (36.6 C). His blood pressure is 131/82 and his pulse is 88. His respiration is 20 and oxygen saturation is 100%.   Wt Readings from Last 3 Encounters:  11/04/17 162 lb (73.5 kg)  10/20/17 155 lb 3.3 oz (70.4 kg)  10/08/17 157 lb (71.2 kg)    Physical Exam  Constitutional: He is oriented to person, place, and time.  HENT:  Head: Normocephalic and atraumatic.  Mouth/Throat: Oropharynx is clear and  moist.  Eyes: Pupils are equal, round, and reactive to light. EOM are normal.  Neck: Normal range of motion.  Cardiovascular: Normal rate, regular rhythm and normal heart sounds.  Pulmonary/Chest: Effort normal and breath sounds normal.  Abdominal: Soft. Bowel sounds are normal.  Musculoskeletal: Normal range of motion. He exhibits no edema, tenderness or deformity.  Lymphadenopathy:    He has no cervical adenopathy.  Neurological: He is alert and oriented to person, place, and time.  Skin: Skin  is warm and dry. No rash noted. No erythema.  Psychiatric: He has a normal mood and affect. His behavior is normal. Judgment and thought content normal.  Vitals reviewed.     Lab Results  Component Value Date   WBC 9.3 11/04/2017   HGB 12.9 (L) 11/04/2017   HCT 40.9 11/04/2017   MCV 92.3 11/04/2017   PLT 180 11/04/2017   Lab Results  Component Value Date   FERRITIN 241 10/08/2017   IRON 84 10/08/2017   TIBC 240 10/08/2017   UIBC 156 10/08/2017   IRONPCTSAT 35 (L) 10/08/2017   Lab Results  Component Value Date   RBC 4.43 11/04/2017   Lab Results  Component Value Date   KAPLAMBRATIO 0.89 03/28/2016   Lab Results  Component Value Date   IGGSERUM 1,066 03/28/2016   IGMSERUM 106 03/28/2016   No results found for: Odetta Pink, SPEI   Chemistry      Component Value Date/Time   NA 141 11/04/2017 1100   NA 143 12/16/2016 1022   K 4.0 11/04/2017 1100   K 3.5 12/16/2016 1022   CL 109 (H) 11/04/2017 1100   CL 101 12/16/2016 1022   CO2 26 11/04/2017 1100   CO2 33 12/16/2016 1022   BUN 12 11/04/2017 1100   BUN 4 (L) 12/16/2016 1022   CREATININE 0.80 11/04/2017 1100   CREATININE 0.8 12/16/2016 1022      Component Value Date/Time   CALCIUM 9.5 11/04/2017 1100   CALCIUM 9.4 12/16/2016 1022   ALKPHOS 70 11/04/2017 1100   ALKPHOS 77 12/16/2016 1022   AST 21 11/04/2017 1100   ALT 22 11/04/2017 1100   ALT 16 12/16/2016 1022    BILITOT 0.5 11/04/2017 1100      Impression and Plan: Mr. Gwenith Daily is a very pleasant 57 yo African American gentleman with metastatic prostate cancer.  Again, he has responded incredibly well.  And is incredibly excited as to how well he is done.  We will continue him on the Lupron and flutamide.  He is due for his Lupron in December.  I forgot to mention that he does have hot flashes and sweats.  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  I would like to see him back in December.  [name], MD 10/9/201912:32 PM

## 2017-11-05 ENCOUNTER — Telehealth: Payer: Self-pay | Admitting: *Deleted

## 2017-11-05 DIAGNOSIS — C7951 Secondary malignant neoplasm of bone: Secondary | ICD-10-CM

## 2017-11-05 DIAGNOSIS — I4891 Unspecified atrial fibrillation: Secondary | ICD-10-CM

## 2017-11-05 DIAGNOSIS — C61 Malignant neoplasm of prostate: Secondary | ICD-10-CM

## 2017-11-05 LAB — IRON AND TIBC
IRON: 77 ug/dL (ref 42–163)
Saturation Ratios: 30 % — ABNORMAL LOW (ref 42–163)
TIBC: 261 ug/dL (ref 202–409)
UIBC: 184 ug/dL

## 2017-11-05 LAB — TESTOSTERONE

## 2017-11-05 LAB — PROSTATE-SPECIFIC AG, SERUM (LABCORP): PROSTATE SPECIFIC AG, SERUM: 0.4 ng/mL (ref 0.0–4.0)

## 2017-11-05 LAB — FERRITIN: FERRITIN: 216 ng/mL (ref 24–336)

## 2017-11-05 MED ORDER — METOPROLOL TARTRATE 25 MG PO TABS: ORAL_TABLET | ORAL | 3 refills | Status: DC

## 2017-11-05 NOTE — Telephone Encounter (Addendum)
Patient aware of results  ----- Message from [name], MD sent at 11/05/2017 10:22 AM EDT ----- Call - PSA is even lower!!!  It is 0.4 now!!!  Laurey Arrow

## 2017-11-06 ENCOUNTER — Other Ambulatory Visit: Payer: Self-pay | Admitting: Hematology & Oncology

## 2017-11-13 ENCOUNTER — Other Ambulatory Visit: Payer: Self-pay

## 2017-11-13 DIAGNOSIS — C61 Malignant neoplasm of prostate: Secondary | ICD-10-CM

## 2017-11-13 DIAGNOSIS — R52 Pain, unspecified: Secondary | ICD-10-CM

## 2017-11-13 MED ORDER — OXYCODONE-ACETAMINOPHEN 10-325 MG PO TABS: 1.0000 | ORAL_TABLET | Freq: Four times a day (QID) | ORAL | 0 refills | Status: DC | PRN

## 2017-11-25 ENCOUNTER — Other Ambulatory Visit: Payer: Self-pay | Admitting: Hematology & Oncology

## 2017-11-25 ENCOUNTER — Telehealth: Payer: Self-pay

## 2017-11-25 DIAGNOSIS — C61 Malignant neoplasm of prostate: Secondary | ICD-10-CM

## 2017-11-25 MED FILL — TAMSULOSIN HCL 0.4 MG CAP: 0.4 | 30 days supply | Qty: 30 | Fill #0

## 2017-11-25 NOTE — Telephone Encounter (Signed)
Received call from patient stating he continues to "sweat profusely." Called back and spoke with patient regarding this concern. Dr Marin Olp had addressed this complaint with patient during last office visit. 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 No new orders at this time. dph

## 2017-12-02 DIAGNOSIS — G894 Chronic pain syndrome: Secondary | ICD-10-CM | POA: Insufficient documentation

## 2017-12-02 DIAGNOSIS — G893 Neoplasm related pain (acute) (chronic): Secondary | ICD-10-CM | POA: Insufficient documentation

## 2017-12-04 ENCOUNTER — Other Ambulatory Visit: Payer: Self-pay | Admitting: Hematology & Oncology

## 2017-12-10 ENCOUNTER — Other Ambulatory Visit: Payer: Self-pay | Admitting: Hematology & Oncology

## 2017-12-11 ENCOUNTER — Other Ambulatory Visit: Payer: Self-pay | Admitting: Family

## 2017-12-11 ENCOUNTER — Other Ambulatory Visit: Payer: Self-pay | Admitting: *Deleted

## 2017-12-11 ENCOUNTER — Inpatient Hospital Stay: Payer: Medicaid Other | Attending: Hematology & Oncology

## 2017-12-11 DIAGNOSIS — C7951 Secondary malignant neoplasm of bone: Principal | ICD-10-CM

## 2017-12-11 DIAGNOSIS — C61 Malignant neoplasm of prostate: Secondary | ICD-10-CM | POA: Diagnosis not present

## 2017-12-11 DIAGNOSIS — N3000 Acute cystitis without hematuria: Secondary | ICD-10-CM

## 2017-12-11 DIAGNOSIS — R300 Dysuria: Secondary | ICD-10-CM

## 2017-12-11 DIAGNOSIS — G8929 Other chronic pain: Secondary | ICD-10-CM

## 2017-12-11 DIAGNOSIS — M5442 Lumbago with sciatica, left side: Secondary | ICD-10-CM

## 2017-12-11 DIAGNOSIS — M5441 Lumbago with sciatica, right side: Secondary | ICD-10-CM

## 2017-12-11 LAB — URINALYSIS, COMPLETE (UACMP) WITH MICROSCOPIC
BILIRUBIN URINE: NEGATIVE
GLUCOSE, UA: NEGATIVE mg/dL
HGB URINE DIPSTICK: NEGATIVE
KETONES UR: 15 mg/dL — AB
Leukocytes, UA: NEGATIVE
NITRITE: NEGATIVE
PH: 7 (ref 5.0–8.0)
Protein, ur: NEGATIVE mg/dL
Specific Gravity, Urine: 1.02 (ref 1.005–1.030)

## 2017-12-11 MED ORDER — FENTANYL 100 MCG/HR TD PT72: 100.0000 ug | MEDICATED_PATCH | TRANSDERMAL | 0 refills | Status: DC

## 2017-12-11 MED ORDER — FENTANYL 50 MCG/HR TD PT72: 50.0000 ug | MEDICATED_PATCH | TRANSDERMAL | 0 refills | Status: DC

## 2017-12-12 LAB — URINE CULTURE

## 2017-12-15 ENCOUNTER — Other Ambulatory Visit: Payer: Self-pay | Admitting: Family

## 2017-12-15 ENCOUNTER — Other Ambulatory Visit: Payer: Self-pay | Admitting: *Deleted

## 2017-12-15 DIAGNOSIS — C61 Malignant neoplasm of prostate: Secondary | ICD-10-CM

## 2017-12-15 DIAGNOSIS — R52 Pain, unspecified: Secondary | ICD-10-CM

## 2017-12-15 DIAGNOSIS — N3000 Acute cystitis without hematuria: Secondary | ICD-10-CM

## 2017-12-15 MED ORDER — SULFAMETHOXAZOLE-TRIMETHOPRIM 800-160 MG PO TABS: 1.0000 | ORAL_TABLET | Freq: Two times a day (BID) | ORAL | 0 refills | Status: DC

## 2017-12-15 MED ORDER — OXYCODONE-ACETAMINOPHEN 10-325 MG PO TABS: 1.0000 | ORAL_TABLET | Freq: Four times a day (QID) | ORAL | 0 refills | Status: DC | PRN

## 2017-12-18 ENCOUNTER — Telehealth: Payer: Self-pay | Admitting: Hematology & Oncology

## 2017-12-18 NOTE — Telephone Encounter (Signed)
Appointments moved to Sarah's schedule per 11/21 staff message

## 2017-12-23 ENCOUNTER — Other Ambulatory Visit: Payer: Self-pay | Admitting: Hematology & Oncology

## 2017-12-23 DIAGNOSIS — C61 Malignant neoplasm of prostate: Secondary | ICD-10-CM

## 2017-12-23 MED FILL — TAMSULOSIN HCL 0.4 MG CAP: 0.4 | 30 days supply | Qty: 30 | Fill #0

## 2018-01-04 ENCOUNTER — Inpatient Hospital Stay: Payer: Self-pay

## 2018-01-04 ENCOUNTER — Other Ambulatory Visit: Payer: Self-pay

## 2018-01-04 ENCOUNTER — Ambulatory Visit: Payer: Self-pay | Admitting: Hematology & Oncology

## 2018-01-04 ENCOUNTER — Ambulatory Visit: Payer: Self-pay | Admitting: Family

## 2018-01-05 ENCOUNTER — Other Ambulatory Visit: Payer: Self-pay | Admitting: Hematology & Oncology

## 2018-01-09 ENCOUNTER — Encounter (HOSPITAL_BASED_OUTPATIENT_CLINIC_OR_DEPARTMENT_OTHER): Payer: Self-pay | Admitting: Emergency Medicine

## 2018-01-09 ENCOUNTER — Emergency Department (HOSPITAL_BASED_OUTPATIENT_CLINIC_OR_DEPARTMENT_OTHER)
Admission: EM | Admit: 2018-01-09 | Discharge: 2018-01-09 | Disposition: A | Discharge status: Home / Self Care | Payer: Medicaid Other | Attending: Emergency Medicine | Admitting: Emergency Medicine

## 2018-01-09 DIAGNOSIS — R110 Nausea: Secondary | ICD-10-CM

## 2018-01-09 DIAGNOSIS — Z79899 Other long term (current) drug therapy: Secondary | ICD-10-CM | POA: Insufficient documentation

## 2018-01-09 DIAGNOSIS — R52 Pain, unspecified: Secondary | ICD-10-CM

## 2018-01-09 DIAGNOSIS — Z8546 Personal history of malignant neoplasm of prostate: Secondary | ICD-10-CM | POA: Insufficient documentation

## 2018-01-09 DIAGNOSIS — R112 Nausea with vomiting, unspecified: Secondary | ICD-10-CM | POA: Diagnosis not present

## 2018-01-09 DIAGNOSIS — F17210 Nicotine dependence, cigarettes, uncomplicated: Secondary | ICD-10-CM | POA: Insufficient documentation

## 2018-01-09 DIAGNOSIS — R300 Dysuria: Secondary | ICD-10-CM

## 2018-01-09 DIAGNOSIS — Z85830 Personal history of malignant neoplasm of bone: Secondary | ICD-10-CM | POA: Insufficient documentation

## 2018-01-09 LAB — COMPREHENSIVE METABOLIC PANEL
ALK PHOS: 68 U/L (ref 38–126)
ALT: 14 U/L (ref 0–44)
AST: 18 U/L (ref 15–41)
Albumin: 4.5 g/dL (ref 3.5–5.0)
Anion gap: 8 (ref 5–15)
BILIRUBIN TOTAL: 0.5 mg/dL (ref 0.3–1.2)
BUN: 7 mg/dL (ref 6–20)
CO2: 23 mmol/L (ref 22–32)
Calcium: 9.2 mg/dL (ref 8.9–10.3)
Chloride: 105 mmol/L (ref 98–111)
Creatinine, Ser: 0.62 mg/dL (ref 0.61–1.24)
GFR calc Af Amer: >60 mL/min (ref >60)
Glucose, Bld: 98 mg/dL (ref 70–99)
Potassium: 3.6 mmol/L (ref 3.5–5.1)
Sodium: 136 mmol/L (ref 135–145)
TOTAL PROTEIN: 7.6 g/dL (ref 6.5–8.1)

## 2018-01-09 LAB — CBC WITH DIFFERENTIAL/PLATELET
Abs Immature Granulocytes: 0.02 10*3/uL (ref 0.00–0.07)
Basophils Absolute: 0 10*3/uL (ref 0.0–0.1)
Basophils Relative: 0 %
EOS PCT: 2 %
Eosinophils Absolute: 0.1 10*3/uL (ref 0.0–0.5)
HCT: 42.4 % (ref 39.0–52.0)
Hemoglobin: 13.6 g/dL (ref 13.0–17.0)
Immature Granulocytes: 0 %
Lymphocytes Relative: 20 %
Lymphs Abs: 1.7 10*3/uL (ref 0.7–4.0)
MCH: 29.2 pg (ref 26.0–34.0)
MCHC: 32.1 g/dL (ref 30.0–36.0)
MCV: 91 fL (ref 80.0–100.0)
Monocytes Absolute: 0.6 10*3/uL (ref 0.1–1.0)
Monocytes Relative: 7 %
Neutro Abs: 6 10*3/uL (ref 1.7–7.7)
Neutrophils Relative %: 71 %
Platelets: 206 10*3/uL (ref 150–400)
RBC: 4.66 MIL/uL (ref 4.22–5.81)
RDW: 14.4 % (ref 11.5–15.5)
WBC: 8.5 10*3/uL (ref 4.0–10.5)
nRBC: 0 % (ref 0.0–0.2)

## 2018-01-09 LAB — URINALYSIS, ROUTINE W REFLEX MICROSCOPIC
BILIRUBIN URINE: NEGATIVE
Glucose, UA: NEGATIVE mg/dL
Hgb urine dipstick: NEGATIVE
Ketones, ur: NEGATIVE mg/dL
Leukocytes, UA: NEGATIVE
Nitrite: NEGATIVE
Protein, ur: NEGATIVE mg/dL
Specific Gravity, Urine: 1.01 (ref 1.005–1.030)
pH: 6.5 (ref 5.0–8.0)

## 2018-01-09 MED ORDER — OXYCODONE-ACETAMINOPHEN 5-325 MG PO TABS
2.0000 | ORAL_TABLET | Freq: Once | ORAL | Status: AC
  Administered 2018-01-09: 2 via ORAL
  Filled 2018-01-09: qty 2

## 2018-01-09 MED ORDER — SODIUM CHLORIDE 0.9 % IV BOLUS
1000.0000 mL | Freq: Once | INTRAVENOUS | Status: AC
  Administered 2018-01-09: 1000 mL via INTRAVENOUS

## 2018-01-09 MED ORDER — ONDANSETRON 4 MG PO TBDP: 4.0000 mg | ORAL_TABLET | Freq: Three times a day (TID) | ORAL | 0 refills | Status: DC | PRN

## 2018-01-09 NOTE — ED Triage Notes (Addendum)
Pt c/o N/V and intermittent fever. Able to eat and drink. Endorses chills and night sweats. Also states it burns when he urinates. Patient is cancer patient with recent treatment a month ago.

## 2018-01-09 NOTE — Discharge Instructions (Signed)
Your labs and urine look normal today.   You may have a viral illness that is causing your symptoms.  Viruses are not treated with antibiotics, they are treated symptomatically. It is recommended that you stay hydrated.  You can treat your symptoms with over-the-counter ibuprofen as needed. Follow-up on Monday with your primary care doctor. Return for new or worsening symptoms.

## 2018-01-09 NOTE — ED Provider Notes (Signed)
[facility] EMERGENCY DEPARTMENT Provider Note   CSN: [id number] Arrival date & time: 01/09/18  1224     History   Chief Complaint Chief Complaint  Patient presents with  . Emesis  . Fever  . Abdominal Pain    HPI Ryan Davis is a 57 y.o. male with past medical history of metastatic prostate cancer to the bone, atrial fibrillation, chronic low back pain, presenting to the emergency department with gradual onset of generalized body aches that began on Monday.  He states his legs feel crampy, and to his back.  He has had brief episode of nausea and vomiting yesterday, he also felt constipated and took a laxative then had an episode of diarrhea.  He had a little bit of central abdominal pain, however not currently with pain in the ED.  He endorses hot and cold flashes without documented fever.  He states he has had recurrence of urinary symptoms that began on Tuesday, consisting of dysuria, increased frequency and odor.  He was recently treated for UTI last month, however states the symptoms did not completely go away after a course of Bactrim.  He states he has not taken any medications for his symptoms.  He denies upper respiratory symptoms. He was seen in the end of September for similar symptoms, states this feels the same.  He states  he felt much better after IV fluids.  Per chart review, during that visit he had basic labs done and was given a liter of IV fluids then discharged with a diagnosis of dehydration. His current cancer treatment includes monthly infusions of Lupron and Zometa.  He currently takes flutamide 3 times daily as well.  He is not currently undergoing chemo or radiation therapy.   The history is provided by the patient and medical records.    Past Medical History:  Diagnosis Date  . A-fib [facility])    see 03-28-16 admission  . Back pain without radiculopathy 03/28/2016  . Dyspnea    endorses since treatment radiation , still gets SOB occasionally   .  Dysrhythmia    afib   . Goals of care, counseling/discussion 06/24/2017  . History of radiation therapy 09/16/16-10/06/16   35 Gy in 14 fractions to the lumbar spine  . Nocturia   . Prostate cancer metastatic to bone ([place]) 03/31/2016  . Prostate cancer metastatic to multiple sites [facility]) 03/31/2016    Patient Active Problem List   Diagnosis Date Noted  . Goals of care, counseling/discussion 06/24/2017  . 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[place]) 03/31/2016  . Prostate cancer metastatic to bone ([place]) 03/31/2016  . Atrial fibrillation ([place]) 03/30/2016  . Back pain without radiculopathy 03/28/2016  . Atrial fibrillation with RVR ([place]) 03/28/2016    Past Surgical History:  Procedure Laterality Date  . MULTIPLE EXTRACTIONS WITH ALVEOLOPLASTY N/A 11/19/2016   Procedure: EXTRACTION OF TOOTH #'S 1,3,5-7,9-17, AND 20- 29 WITH ALVEOLOPLASTY, BILATERAL MANDIBULAR TORI REDUCTIONS AND BILATERAL MAXILLARY BUCCAL EXOSTOSES REDUCTIONS;  Surgeon: Lenn Cal, DDS;  Location: [facility] ORS;  Service: Oral Surgery;  Laterality: N/A;  . NO PAST SURGERIES          Home Medications    Prior to Admission medications   Medication Sig Start Date End Date Taking? Authorizing Provider  Calcium Carbonate-Vitamin D (CALCIUM-VITAMIN D3 PO) Take 1 tablet by mouth daily.    [provider]  diltiazem (CARDIZEM CD) 240 MG 24 hr capsule TAKE 1 CAPSULE BY MOUTH EVERY DAY 01/05/18   [place]never,  Rudell Cobb, MD  dronabinol (MARINOL) 5 MG capsule Take 1 capsule (5 mg total) by mouth 2 (two) times daily before a meal. 10/08/17   [place], Holli Humbles, NP  fentaNYL (DURAGESIC - DOSED MCG/HR) 100 MCG/HR Place 1 patch (100 mcg total) onto the skin every 3 (three) days. Place with 62mcg patch for total of 136mcg. 12/11/17   [name], MD  fentaNYL (DURAGESIC - DOSED MCG/HR) 50 MCG/HR Place 1 patch (50 mcg total) onto the skin every 3 (three) days. Place with 173mcg patch for total of 120mcg. 12/11/17   [name], MD  flutamide (EULEXIN) 125 MG capsule Take 2 capsules (250 mg total) by mouth 3 (three) times daily. 08/19/17   [name], MD  flutamide (EULEXIN) 125 MG capsule TAKE 2 CAPSULES(250 MG) BY MOUTH THREE TIMES DAILY 12/10/17   [name], MD  megestrol (MEGACE ES) 625 MG/5ML suspension Take 5 mLs (625 mg total) by mouth 2 (two) times daily. 05/29/17   [name], MD  metoprolol tartrate (LOPRESSOR) 25 MG tablet TAKE 1/2 TABLET(12.5 MG) BY MOUTH TWICE DAILY 11/05/17   [name], MD  ondansetron (ZOFRAN) 4 MG tablet Take 1 tablet (4 mg total) by mouth every 6 (six) hours as needed for nausea or vomiting. 08/25/17   [name], MD  ondansetron (ZOFRAN-ODT) 4 MG disintegrating tablet Take 1 tablet (4 mg total) by mouth every 8 (eight) hours as needed for nausea or vomiting. 01/09/18   , [place] N, PA-C  oxyCODONE-acetaminophen (PERCOCET) 10-325 MG tablet Take 1 tablet by mouth every 6 (six) hours as needed for pain. 12/15/17   [name], MD  sulfamethoxazole-trimethoprim (BACTRIM DS,SEPTRA DS) 800-160 MG tablet Take 1 tablet by mouth 2 (two) times daily. 12/15/17   [place], Holli Humbles, NP  tamsulosin (FLOMAX) 0.4 MG CAPS capsule TAKE 1 CAPSULE (0.4 MG TOTAL) BY MOUTH DAILY. 12/23/17   [name], MD    Family History Family History  Problem Relation Age of Onset  . Stroke Sister   . Lupus Mother   . Cancer Neg Hx   . Heart disease Neg Hx   . Diabetes Neg Hx     Social History Social History   Tobacco Use  . Smoking status: Current Some Day Smoker    Packs/day: 0.25    Years: 39.00    Pack years: 9.75    Types: Cigarettes    Last attempt to quit: 10/13/2016    Years since quitting: 1.2  . Smokeless tobacco: Never Used  . Tobacco comment: 04/18/2016 still smokes; 11-17-16 states some day smoker   Substance Use Topics  . Alcohol use: No    Comment: has not drank in 7 months.  He used to drink 40oz  of beer daily  . Drug use: No      Allergies   Contrast media [iodinated diagnostic agents] and Other   Review of Systems Review of Systems  Constitutional: Positive for chills and fever (Subjective).  HENT: Negative for congestion, rhinorrhea and sore throat.   Respiratory: Negative for cough and shortness of breath.   Gastrointestinal: Positive for abdominal pain, constipation, nausea and vomiting.  Genitourinary: Positive for dysuria and frequency. Negative for hematuria, penile pain, scrotal swelling and testicular pain.  All other systems reviewed and are negative.    Physical Exam Updated Vital Signs BP (!) 153/87 (BP Location: Right Arm)   Pulse (!) 57   Temp 98.1 F (36.7 C) (Oral)  Resp 16   SpO2 100%   Physical Exam Vitals signs and nursing note reviewed.  Constitutional:      General: He is not in acute distress.    Appearance: He is well-developed.  HENT:     Head: Normocephalic and atraumatic.  Eyes:     Conjunctiva/sclera: Conjunctivae normal.  Cardiovascular:     Rate and Rhythm: Normal rate and regular rhythm.  Pulmonary:     Effort: Pulmonary effort is normal.     Breath sounds: Normal breath sounds.  Abdominal:     General: Abdomen is flat. Bowel sounds are normal. There is no distension.     Palpations: Abdomen is soft.     Tenderness: There is no abdominal tenderness.  Skin:    General: Skin is warm.  Neurological:     Mental Status: He is alert.  Psychiatric:        Behavior: Behavior normal.      ED Treatments / Results  Labs (all labs ordered are listed, but only abnormal results are displayed) Labs Reviewed  URINE CULTURE  CBC WITH DIFFERENTIAL/PLATELET  COMPREHENSIVE METABOLIC PANEL  URINALYSIS, ROUTINE W REFLEX MICROSCOPIC    EKG None  Radiology No results found.  Procedures Procedures (including critical care time)  Medications Ordered in ED Medications  sodium chloride 0.9 % bolus 1,000 mL (0 mLs Intravenous Stopped 01/09/18 1634)   oxyCODONE-acetaminophen (PERCOCET/ROXICET) 5-325 MG per tablet 2 tablet (2 tablets Oral Given 01/09/18 1733)     Initial Impression / Assessment and Plan / ED Course  I have reviewed the triage vital signs and the nursing notes.  Pertinent labs & imaging results that were available during my care of the patient were reviewed by me and considered in my medical decision making (see chart for details).     Pt with body aches and an episode of nausea and vomiting yesterday. No current chemo or radiation therapy.  He is well-appearing on exam,no distress. Abd is soft and nontender. VS normal. Labs are normal. Urine is normal. Treated with IVF w some improvement. Repeat abd exam reveals no indication for advanced imaging. Symptoms may be due to viral illness vs dehydration. Pt is safe for discharge. Recommend he follow up closely with PCP. Return precautions discussed.   Discussed results, findings, treatment and follow up. Patient advised of return precautions. Patient verbalized understanding and agreed with plan.   Final Clinical Impressions(s) / ED Diagnoses   Final diagnoses:  Generalized body aches  Nausea  Dysuria    ED Discharge Orders         Ordered    ondansetron (ZOFRAN-ODT) 4 MG disintegrating tablet  Every 8 hours PRN     01/09/18 1734           , [place] N, PA-C 01/09/18 [id number]    Malvin Johns, MD 01/10/18 [phone]

## 2018-01-09 NOTE — ED Notes (Signed)
Patient was given a urinal to use the restroom.  

## 2018-01-11 LAB — URINE CULTURE: Culture: 20000 — AB

## 2018-01-13 ENCOUNTER — Other Ambulatory Visit: Payer: Self-pay | Admitting: *Deleted

## 2018-01-13 DIAGNOSIS — C7951 Secondary malignant neoplasm of bone: Principal | ICD-10-CM

## 2018-01-13 DIAGNOSIS — M5441 Lumbago with sciatica, right side: Secondary | ICD-10-CM

## 2018-01-13 DIAGNOSIS — R52 Pain, unspecified: Secondary | ICD-10-CM

## 2018-01-13 DIAGNOSIS — C61 Malignant neoplasm of prostate: Secondary | ICD-10-CM

## 2018-01-13 DIAGNOSIS — G8929 Other chronic pain: Secondary | ICD-10-CM

## 2018-01-13 DIAGNOSIS — M5442 Lumbago with sciatica, left side: Secondary | ICD-10-CM

## 2018-01-13 MED ORDER — FENTANYL 50 MCG/HR TD PT72: 50.0000 ug | MEDICATED_PATCH | TRANSDERMAL | 0 refills | Status: DC

## 2018-01-13 MED ORDER — FENTANYL 100 MCG/HR TD PT72: 100.0000 ug | MEDICATED_PATCH | TRANSDERMAL | 0 refills | Status: DC

## 2018-01-13 MED ORDER — OXYCODONE-ACETAMINOPHEN 10-325 MG PO TABS: 1.0000 | ORAL_TABLET | Freq: Four times a day (QID) | ORAL | 0 refills | Status: DC | PRN

## 2018-01-19 ENCOUNTER — Inpatient Hospital Stay (HOSPITAL_BASED_OUTPATIENT_CLINIC_OR_DEPARTMENT_OTHER): Payer: Medicaid Other | Admitting: Family

## 2018-01-19 ENCOUNTER — Other Ambulatory Visit: Payer: Self-pay

## 2018-01-19 ENCOUNTER — Inpatient Hospital Stay: Payer: Medicaid Other | Attending: Hematology & Oncology | Admitting: Family

## 2018-01-19 ENCOUNTER — Encounter: Payer: Self-pay | Admitting: Family

## 2018-01-19 ENCOUNTER — Inpatient Hospital Stay: Payer: Medicaid Other

## 2018-01-19 VITALS — BP 110/64 | HR 76 | Temp 98.2°F | Resp 19 | Wt 177.0 lb

## 2018-01-19 DIAGNOSIS — M5441 Lumbago with sciatica, right side: Secondary | ICD-10-CM | POA: Insufficient documentation

## 2018-01-19 DIAGNOSIS — C61 Malignant neoplasm of prostate: Secondary | ICD-10-CM

## 2018-01-19 DIAGNOSIS — R61 Generalized hyperhidrosis: Secondary | ICD-10-CM

## 2018-01-19 DIAGNOSIS — Z923 Personal history of irradiation: Secondary | ICD-10-CM

## 2018-01-19 DIAGNOSIS — C7951 Secondary malignant neoplasm of bone: Secondary | ICD-10-CM

## 2018-01-19 DIAGNOSIS — M5442 Lumbago with sciatica, left side: Secondary | ICD-10-CM | POA: Insufficient documentation

## 2018-01-19 DIAGNOSIS — Z5111 Encounter for antineoplastic chemotherapy: Secondary | ICD-10-CM | POA: Insufficient documentation

## 2018-01-19 DIAGNOSIS — G8929 Other chronic pain: Secondary | ICD-10-CM | POA: Diagnosis not present

## 2018-01-19 LAB — CMP (CANCER CENTER ONLY)
ALT: 26 U/L (ref 0–44)
AST: 29 U/L (ref 15–41)
Albumin: 4.3 g/dL (ref 3.5–5.0)
Alkaline Phosphatase: 74 U/L (ref 38–126)
Anion gap: 7 (ref 5–15)
BUN: 6 mg/dL (ref 6–20)
CO2: 30 mmol/L (ref 22–32)
Calcium: 9.2 mg/dL (ref 8.9–10.3)
Chloride: 105 mmol/L (ref 98–111)
Creatinine: 0.81 mg/dL (ref 0.61–1.24)
GFR, Est AFR Am: >60 mL/min (ref >60)
Glucose, Bld: 143 mg/dL — ABNORMAL HIGH (ref 70–99)
Potassium: 4.2 mmol/L (ref 3.5–5.1)
Sodium: 142 mmol/L (ref 135–145)
Total Bilirubin: 0.3 mg/dL (ref 0.3–1.2)
Total Protein: 6.5 g/dL (ref 6.5–8.1)

## 2018-01-19 LAB — CBC WITH DIFFERENTIAL (CANCER CENTER ONLY)
Abs Immature Granulocytes: 0.05 10*3/uL (ref 0.00–0.07)
Basophils Absolute: 0 10*3/uL (ref 0.0–0.1)
Basophils Relative: 0 %
Eosinophils Absolute: 0.4 10*3/uL (ref 0.0–0.5)
Eosinophils Relative: 4 %
HCT: 41.5 % (ref 39.0–52.0)
Hemoglobin: 12.7 g/dL — ABNORMAL LOW (ref 13.0–17.0)
Immature Granulocytes: 1 %
Lymphocytes Relative: 26 %
Lymphs Abs: 2.7 10*3/uL (ref 0.7–4.0)
MCH: 29.7 pg (ref 26.0–34.0)
MCHC: 30.6 g/dL (ref 30.0–36.0)
MCV: 97.2 fL (ref 80.0–100.0)
Monocytes Absolute: 1 10*3/uL (ref 0.1–1.0)
Monocytes Relative: 10 %
NRBC: 0 % (ref 0.0–0.2)
Neutro Abs: 6.4 10*3/uL (ref 1.7–7.7)
Neutrophils Relative %: 59 %
Platelet Count: 168 10*3/uL (ref 150–400)
RBC: 4.27 MIL/uL (ref 4.22–5.81)
RDW: 14.7 % (ref 11.5–15.5)
WBC Count: 10.6 10*3/uL — ABNORMAL HIGH (ref 4.0–10.5)

## 2018-01-19 MED ORDER — ZOLEDRONIC ACID 4 MG/100ML IV SOLN
4.0000 mg | Freq: Once | INTRAVENOUS | Status: AC
  Administered 2018-01-19: 4 mg via INTRAVENOUS
  Filled 2018-01-19: qty 100

## 2018-01-19 MED ORDER — FENTANYL 100 MCG/HR TD PT72: 200.0000 ug | MEDICATED_PATCH | TRANSDERMAL | 0 refills | Status: DC

## 2018-01-19 MED ORDER — LEUPROLIDE ACETATE (3 MONTH) 11.25 MG IM KIT
11.2500 mg | PACK | Freq: Once | INTRAMUSCULAR | Status: AC
  Administered 2018-01-19: 11.25 mg via INTRAMUSCULAR
  Filled 2018-01-19: qty 11.25

## 2018-01-19 MED ORDER — FLUTAMIDE 125 MG PO CAPS: ORAL_CAPSULE | ORAL | 4 refills | Status: DC

## 2018-01-19 MED ORDER — SODIUM CHLORIDE 0.9 % IV SOLN
INTRAVENOUS | Status: DC
  Administered 2018-01-19: 12:00:00 via INTRAVENOUS
  Filled 2018-01-19: qty 250

## 2018-01-19 NOTE — Progress Notes (Signed)
Hematology and Oncology Follow Up Visit  Ryan Davis [id number] [birth year] 57 y.o. 01/19/2018   Principle Diagnosis:  Metastatic prostate cancer - androgen sensitive  Past Therapy: 14 fractions of radiation to the lumbar spine Casodex 50 mg by mouth daily- stopped 04/02/2017 due to progression Zytiga 1000 mg by mouth daily/prednisone 10 mg by mouth daily - started on 04/20/2016- d/c on 04/02/2017 Xtandi160 mg po q day - startedon 04/06/2017-- d/c on 06/24/2017  Current Therapy:   Lupron 11.5 mg IM every 3 month - next dose Dec 2019 Zometa 4 mg IV q3 months - next dose Dec 2019 Flutamide 250 mg po q8hr - started06/12/2017   Interim History:  Ryan Davis is here today for follow-up. He is having hot flashes and night sweats quite a bit and will take a break to rest and sit in front of a fan as needed.  He states that he is taking his Flutamide daily as prescribed.  PSA in October was down to 0.4 and testosterone was < 3.  He has fentanyl Duragesic patches on for a total of 150 mcg changed out every 3 days. He is taking his percocet every 6 hours as prescribed and is still having generalized breakthrough pain all over.  He has also noted new pelvic pain. He denies feeling like he has a UTI at this time.  He states that Dr. Ermalene Postin has him on gabapentin for neuropathy. He still has the numbness and tingling in his hands and feet. This waxes and wanes. No swelling in his extremities at this time.  No lymphadenopathy noted on exam.  He is still staying quite busy caring for his wife but thankfully he has someone to come in and help.  No fever, chills, n/v, cough, rash, dizziness, SOB, chest pain, palpitations, abdominal pain or changes in bowel habits.  He has maintained a good appetite and is staying well hydrated. His weight is up 15 lbs since his last visit.   ECOG Performance Status: 1 - Symptomatic but completely ambulatory  Medications:  Allergies as of 01/19/2018    Reactions   Contrast Media [iodinated Diagnostic Agents] Nausea And Vomiting   Patient vomits immediately after IV contrast is injected just prior to start of scan, was unable to scan patient in correct time frame.  Patient did not warn me of this when dosing the patient with oral CM and asking the patient history questions until the patient was on the table for the CT scan just prior to injection.  Ct Tech:  Karl Bales 07/29/17    Other Nausea And Vomiting   Patient vomits immediately after IV contrast is injected just prior to start of scan, was unable to scan patient in correct time frame.  Patient did not warn me of this when dosing the patient with oral CM and asking the patient history questions until the patient was on the table for the CT scan just prior to injection.  Ct Tech:  Karl Bales 07/29/17       Medication List       Accurate as of January 19, 2018 11:23 AM. Always use your most recent med list.        CALCIUM-VITAMIN D3 PO Take 1 tablet by mouth daily.   diltiazem 240 MG 24 hr capsule Commonly known as:  CARDIZEM CD TAKE 1 CAPSULE BY MOUTH EVERY DAY   dronabinol 5 MG capsule Commonly known as:  MARINOL Take 1 capsule (5 mg total) by mouth 2 (two) times daily  before a meal.   fentaNYL 50 MCG/HR Commonly known as:  DURAGESIC - dosed mcg/hr Place 1 patch (50 mcg total) onto the skin every 3 (three) days. Place with 153mcg patch for total of 112mcg.   fentaNYL 100 MCG/HR Commonly known as:  DURAGESIC - dosed mcg/hr Place 1 patch (100 mcg total) onto the skin every 3 (three) days. Place with 53mcg patch for total of 122mcg.   flutamide 125 MG capsule Commonly known as:  EULEXIN Take 2 capsules (250 mg total) by mouth 3 (three) times daily.   flutamide 125 MG capsule Commonly known as:  EULEXIN TAKE 2 CAPSULES(250 MG) BY MOUTH THREE TIMES DAILY   megestrol 625 MG/5ML suspension Commonly known as:  MEGACE ES Take 5 mLs (625 mg total) by mouth 2 (two) times  daily.   metoprolol tartrate 25 MG tablet Commonly known as:  LOPRESSOR TAKE 1/2 TABLET(12.5 MG) BY MOUTH TWICE DAILY   ondansetron 4 MG disintegrating tablet Commonly known as:  ZOFRAN ODT Take 1 tablet (4 mg total) by mouth every 8 (eight) hours as needed for nausea or vomiting.   ondansetron 4 MG tablet Commonly known as:  ZOFRAN Take 1 tablet (4 mg total) by mouth every 6 (six) hours as needed for nausea or vomiting.   oxyCODONE-acetaminophen 10-325 MG tablet Commonly known as:  PERCOCET Take 1 tablet by mouth every 6 (six) hours as needed for pain.   sulfamethoxazole-trimethoprim 800-160 MG tablet Commonly known as:  BACTRIM DS,SEPTRA DS Take 1 tablet by mouth 2 (two) times daily.   tamsulosin 0.4 MG Caps capsule Commonly known as:  FLOMAX TAKE 1 CAPSULE (0.4 MG TOTAL) BY MOUTH DAILY.       Allergies:  Allergies  Allergen Reactions  . Contrast Media [Iodinated Diagnostic Agents] Nausea And Vomiting    Patient vomits immediately after IV contrast is injected just prior to start of scan, was unable to scan patient in correct time frame.  Patient did not warn me of this when dosing the patient with oral CM and asking the patient history questions until the patient was on the table for the CT scan just prior to injection.  Ct Tech:  Karl Bales 07/29/17   . Other Nausea And Vomiting    Patient vomits immediately after IV contrast is injected just prior to start of scan, was unable to scan patient in correct time frame.  Patient did not warn me of this when dosing the patient with oral CM and asking the patient history questions until the patient was on the table for the CT scan just prior to injection.  Ct Tech:  Karl Bales 07/29/17     Past Medical History, Surgical history, Social history, and Family History were reviewed and updated.  Review of Systems: All other 10 point review of systems is negative.   Physical Exam:  weight is 177 lb (80.3 kg). His oral  temperature is 98.2 F (36.8 C). His blood pressure is 110/64 and his pulse is 76. His respiration is 19 and oxygen saturation is 99%.   Wt Readings from Last 3 Encounters:  01/19/18 177 lb (80.3 kg)  11/04/17 162 lb (73.5 kg)  10/20/17 155 lb 3.3 oz (70.4 kg)    Ocular: Sclerae unicteric, pupils equal, round and reactive to light Ear-nose-throat: Oropharynx clear, dentition fair Lymphatic: No cervical, supraclavicular or axillary adenopathy Lungs no rales or rhonchi, good excursion bilaterally Heart regular rate and rhythm, no murmur appreciated Abd soft, nontender, positive bowel sounds, no liver  or spleen tip palpated on exam, no fluid wave  MSK no focal spinal tenderness, no joint edema Neuro: non-focal, well-oriented, appropriate affect Breasts: Deferred   Lab Results  Component Value Date   WBC 10.6 (H) 01/19/2018   HGB 12.7 (L) 01/19/2018   HCT 41.5 01/19/2018   MCV 97.2 01/19/2018   PLT 168 01/19/2018   Lab Results  Component Value Date   FERRITIN 216 11/04/2017   IRON 77 11/04/2017   TIBC 261 11/04/2017   UIBC 184 11/04/2017   IRONPCTSAT 30 (L) 11/04/2017   Lab Results  Component Value Date   RBC 4.27 01/19/2018   Lab Results  Component Value Date   KAPLAMBRATIO 0.89 03/28/2016   Lab Results  Component Value Date   IGGSERUM 1,066 03/28/2016   IGMSERUM 106 03/28/2016   No results found for: Odetta Pink, SPEI   Chemistry      Component Value Date/Time   NA 136 01/09/2018 1511   NA 143 12/16/2016 1022   K 3.6 01/09/2018 1511   K 3.5 12/16/2016 1022   CL 105 01/09/2018 1511   CL 101 12/16/2016 1022   CO2 23 01/09/2018 1511   CO2 33 12/16/2016 1022   BUN 7 01/09/2018 1511   BUN 4 (L) 12/16/2016 1022   CREATININE 0.62 01/09/2018 1511   CREATININE 0.80 11/04/2017 1100   CREATININE 0.8 12/16/2016 1022      Component Value Date/Time   CALCIUM 9.2 01/09/2018 1511   CALCIUM 9.4 12/16/2016 1022    ALKPHOS 68 01/09/2018 1511   ALKPHOS 77 12/16/2016 1022   AST 18 01/09/2018 1511   AST 21 11/04/2017 1100   ALT 14 01/09/2018 1511   ALT 22 11/04/2017 1100   ALT 16 12/16/2016 1022   BILITOT 0.5 01/09/2018 1511   BILITOT 0.5 11/04/2017 1100       Impression and Plan: Ryan Davis is a very pleasant 57 yo African American gentleman with metastatic prostate cancer. He continues to tolerate treatment nicely aside from the hot flashes and sweats.  PSA and testosterone levels are pending.  We will get him scheduled for a PET scan to evaluate his response to treatment.  He will continue his same regimen with Flutamide. Refill order sent.  I spoke with Dr. Marin Olp regarding his breakthrough pain and we will increase his Duragesic patch to 200 mcg.  Her received Lupron and Zometa today.  We will see him for MD follow-up in 6 weeks.  He will contact our office with any questions or concerns. We can certainly see him sooner if need be.   Laverna Peace, NP 12/24/201911:23 AM

## 2018-01-19 NOTE — Patient Instructions (Signed)

## 2018-01-20 LAB — PSA, TOTAL AND FREE
PSA, Free Pct: 7.5 %
PSA, Free: 0.09 ng/mL
Prostate Specific Ag, Serum: 1.2 ng/mL (ref 0.0–4.0)

## 2018-01-20 LAB — TESTOSTERONE: Testosterone: <3 ng/dL — ABNORMAL LOW (ref 264–916)

## 2018-01-25 ENCOUNTER — Other Ambulatory Visit: Payer: Self-pay | Admitting: Hematology & Oncology

## 2018-01-25 DIAGNOSIS — C61 Malignant neoplasm of prostate: Secondary | ICD-10-CM

## 2018-01-25 MED FILL — TAMSULOSIN HCL 0.4 MG CAP: 0.4 | 30 days supply | Qty: 30 | Fill #0

## 2018-02-06 ENCOUNTER — Other Ambulatory Visit: Payer: Self-pay | Admitting: Hematology & Oncology

## 2018-02-11 ENCOUNTER — Other Ambulatory Visit: Payer: Self-pay | Admitting: *Deleted

## 2018-02-11 DIAGNOSIS — M5442 Lumbago with sciatica, left side: Secondary | ICD-10-CM

## 2018-02-11 DIAGNOSIS — C7951 Secondary malignant neoplasm of bone: Secondary | ICD-10-CM

## 2018-02-11 DIAGNOSIS — C61 Malignant neoplasm of prostate: Secondary | ICD-10-CM

## 2018-02-11 DIAGNOSIS — R52 Pain, unspecified: Secondary | ICD-10-CM

## 2018-02-11 DIAGNOSIS — M5441 Lumbago with sciatica, right side: Secondary | ICD-10-CM

## 2018-02-11 DIAGNOSIS — G8929 Other chronic pain: Secondary | ICD-10-CM

## 2018-02-15 ENCOUNTER — Other Ambulatory Visit: Payer: Self-pay | Admitting: Hematology & Oncology

## 2018-02-15 ENCOUNTER — Other Ambulatory Visit: Payer: Self-pay | Admitting: *Deleted

## 2018-02-15 DIAGNOSIS — C7951 Secondary malignant neoplasm of bone: Principal | ICD-10-CM

## 2018-02-15 DIAGNOSIS — M5441 Lumbago with sciatica, right side: Secondary | ICD-10-CM

## 2018-02-15 DIAGNOSIS — G8929 Other chronic pain: Secondary | ICD-10-CM

## 2018-02-15 DIAGNOSIS — M5442 Lumbago with sciatica, left side: Secondary | ICD-10-CM

## 2018-02-15 DIAGNOSIS — C61 Malignant neoplasm of prostate: Secondary | ICD-10-CM

## 2018-02-15 DIAGNOSIS — R52 Pain, unspecified: Secondary | ICD-10-CM

## 2018-02-15 MED ORDER — FENTANYL 100 MCG/HR TD PT72: 2.0000 | MEDICATED_PATCH | TRANSDERMAL | 0 refills | Status: DC

## 2018-02-15 MED ORDER — OXYCODONE-ACETAMINOPHEN 10-325 MG PO TABS: 1.0000 | ORAL_TABLET | Freq: Four times a day (QID) | ORAL | 0 refills | Status: DC | PRN

## 2018-02-19 ENCOUNTER — Other Ambulatory Visit: Payer: Self-pay | Admitting: Hematology & Oncology

## 2018-02-19 DIAGNOSIS — C61 Malignant neoplasm of prostate: Secondary | ICD-10-CM

## 2018-03-01 MED FILL — TAMSULOSIN HCL 0.4 MG CAP: 0.4 | 30 days supply | Qty: 30 | Fill #0

## 2018-03-01 MED FILL — ONDANSETRON HCL 4 MG TABLET: 4 | 8 days supply | Qty: 30 | Fill #0

## 2018-03-03 ENCOUNTER — Inpatient Hospital Stay: Payer: Medicaid Other

## 2018-03-03 ENCOUNTER — Inpatient Hospital Stay: Payer: Medicaid Other | Admitting: Hematology & Oncology

## 2018-03-04 ENCOUNTER — Telehealth: Payer: Self-pay | Admitting: *Deleted

## 2018-03-04 NOTE — Telephone Encounter (Signed)
Returned call to patient regarding Fentanyl patch. Pt states " the pharmacy sold me a spray for my nose because they thought I was taking too much medicine with the patches. " Per 12/24 MD note-regarding his breakthrough pain and we will increase his Duragesic patch to 200 mcg.   Pt states he thinks he might need to go back tot he 150patch as the pharmacist scared me telling me I needed the spray in case I don't wake up.  Pt then advised " I will try the 269mcg patch." Asked pt if he is taking Oxycodone. Pt states " I take 2 oxycodone a day;1 in the morning and 1 at bedtime.  Requested pt call office in 2-3 days to advise how the pain patches are working.  Pt verbalized understanding.  Pt requested follow up appt with MD, discussed pt had an appt 2/5- no show   Message to scheduling for new pt appt.  Call to [facility] to clarify above" nasal spray" Per [facility], pt filled Fentanyl patch rx in Dec and was offered Narcan ( spray) and refused. Fentanyl Rx was refilled 1/20 and on 2/4 pt called back to [facility] and requested Narcan spray that was offered.

## 2018-03-05 ENCOUNTER — Telehealth: Payer: Self-pay | Admitting: Hematology & Oncology

## 2018-03-05 NOTE — Telephone Encounter (Signed)
Called and spoke with patient regarding rescheduling appointments per 2/6 staff message

## 2018-03-08 ENCOUNTER — Other Ambulatory Visit: Payer: Self-pay | Admitting: Hematology & Oncology

## 2018-03-16 ENCOUNTER — Ambulatory Visit: Payer: Medicaid Other | Admitting: Hematology & Oncology

## 2018-03-16 ENCOUNTER — Other Ambulatory Visit: Payer: Medicaid Other

## 2018-03-17 ENCOUNTER — Other Ambulatory Visit: Payer: Self-pay | Admitting: *Deleted

## 2018-03-17 DIAGNOSIS — G8929 Other chronic pain: Secondary | ICD-10-CM

## 2018-03-17 DIAGNOSIS — C61 Malignant neoplasm of prostate: Secondary | ICD-10-CM

## 2018-03-17 DIAGNOSIS — M5441 Lumbago with sciatica, right side: Secondary | ICD-10-CM

## 2018-03-17 DIAGNOSIS — C7951 Secondary malignant neoplasm of bone: Secondary | ICD-10-CM

## 2018-03-17 DIAGNOSIS — M5442 Lumbago with sciatica, left side: Secondary | ICD-10-CM

## 2018-03-17 DIAGNOSIS — R52 Pain, unspecified: Secondary | ICD-10-CM

## 2018-03-17 MED ORDER — FENTANYL 100 MCG/HR TD PT72: 2.0000 | MEDICATED_PATCH | TRANSDERMAL | 0 refills | Status: DC

## 2018-03-17 MED ORDER — OXYCODONE-ACETAMINOPHEN 10-325 MG PO TABS: 1.0000 | ORAL_TABLET | Freq: Four times a day (QID) | ORAL | 0 refills | Status: DC | PRN

## 2018-03-23 ENCOUNTER — Other Ambulatory Visit: Payer: Self-pay | Admitting: Family

## 2018-03-23 DIAGNOSIS — C61 Malignant neoplasm of prostate: Secondary | ICD-10-CM

## 2018-03-24 ENCOUNTER — Inpatient Hospital Stay (HOSPITAL_BASED_OUTPATIENT_CLINIC_OR_DEPARTMENT_OTHER): Payer: Medicaid Other | Admitting: Hematology & Oncology

## 2018-03-24 ENCOUNTER — Other Ambulatory Visit: Payer: Self-pay

## 2018-03-24 ENCOUNTER — Inpatient Hospital Stay: Payer: Medicaid Other | Attending: Hematology & Oncology

## 2018-03-24 VITALS — BP 105/63 | HR 76 | Temp 97.9°F | Resp 20 | Wt 172.0 lb

## 2018-03-24 DIAGNOSIS — E291 Testicular hypofunction: Secondary | ICD-10-CM | POA: Diagnosis not present

## 2018-03-24 DIAGNOSIS — C61 Malignant neoplasm of prostate: Secondary | ICD-10-CM

## 2018-03-24 DIAGNOSIS — R630 Anorexia: Secondary | ICD-10-CM

## 2018-03-24 DIAGNOSIS — R634 Abnormal weight loss: Secondary | ICD-10-CM

## 2018-03-24 DIAGNOSIS — C7951 Secondary malignant neoplasm of bone: Secondary | ICD-10-CM | POA: Insufficient documentation

## 2018-03-24 DIAGNOSIS — R232 Flushing: Secondary | ICD-10-CM

## 2018-03-24 LAB — CMP (CANCER CENTER ONLY)
ALT: 26 U/L (ref 0–44)
AST: 29 U/L (ref 15–41)
Albumin: 5.1 g/dL — ABNORMAL HIGH (ref 3.5–5.0)
Alkaline Phosphatase: 93 U/L (ref 38–126)
Anion gap: 9 (ref 5–15)
BUN: 8 mg/dL (ref 6–20)
CHLORIDE: 103 mmol/L (ref 98–111)
CO2: 28 mmol/L (ref 22–32)
Calcium: 9.8 mg/dL (ref 8.9–10.3)
Creatinine: 0.8 mg/dL (ref 0.61–1.24)
GFR, Est AFR Am: >60 mL/min (ref >60)
Glucose, Bld: 114 mg/dL — ABNORMAL HIGH (ref 70–99)
Potassium: 4 mmol/L (ref 3.5–5.1)
Sodium: 140 mmol/L (ref 135–145)
Total Bilirubin: 0.4 mg/dL (ref 0.3–1.2)
Total Protein: 7.9 g/dL (ref 6.5–8.1)

## 2018-03-24 LAB — CBC WITH DIFFERENTIAL (CANCER CENTER ONLY)
Abs Immature Granulocytes: 0.02 10*3/uL (ref 0.00–0.07)
Basophils Absolute: 0.1 10*3/uL (ref 0.0–0.1)
Basophils Relative: 1 %
Eosinophils Absolute: 0.5 10*3/uL (ref 0.0–0.5)
Eosinophils Relative: 5 %
HCT: 41.4 % (ref 39.0–52.0)
Hemoglobin: 13.5 g/dL (ref 13.0–17.0)
Immature Granulocytes: 0 %
Lymphocytes Relative: 23 %
Lymphs Abs: 2.3 10*3/uL (ref 0.7–4.0)
MCH: 29.8 pg (ref 26.0–34.0)
MCHC: 32.6 g/dL (ref 30.0–36.0)
MCV: 91.4 fL (ref 80.0–100.0)
Monocytes Absolute: 0.7 10*3/uL (ref 0.1–1.0)
Monocytes Relative: 7 %
NEUTROS PCT: 64 %
Neutro Abs: 6.2 10*3/uL (ref 1.7–7.7)
PLATELETS: 198 10*3/uL (ref 150–400)
RBC: 4.53 MIL/uL (ref 4.22–5.81)
RDW: 14.8 % (ref 11.5–15.5)
WBC Count: 9.7 10*3/uL (ref 4.0–10.5)
nRBC: 0 % (ref 0.0–0.2)

## 2018-03-24 LAB — URINALYSIS, COMPLETE (UACMP) WITH MICROSCOPIC
Bilirubin Urine: NEGATIVE
Glucose, UA: NEGATIVE mg/dL
Hgb urine dipstick: NEGATIVE
Ketones, ur: NEGATIVE mg/dL
Leukocytes,Ua: NEGATIVE
Nitrite: NEGATIVE
Protein, ur: NEGATIVE mg/dL
RBC / HPF: NONE SEEN RBC/hpf (ref 0–5)
Specific Gravity, Urine: 1.02 (ref 1.005–1.030)
WBC, UA: NONE SEEN WBC/hpf (ref 0–5)
pH: 6.5 (ref 5.0–8.0)

## 2018-03-24 MED ORDER — MEGESTROL ACETATE 625 MG/5ML PO SUSP: 625.0000 mg | Freq: Two times a day (BID) | ORAL | 4 refills | Status: DC

## 2018-03-24 NOTE — Progress Notes (Signed)
Hematology and Oncology Follow Up Visit  RAJINDER MESICK [id number] Aug 01, 1960 58 y.o. 03/24/2018   Principle Diagnosis:  Metastatic prostate cancer - androgen sensitive  Past Therapy: 14 fractions of radiation to the lumbar spine Casodex 50 mg by mouth daily- stopped 04/02/2017 due to progression Zytiga 1000 mg by mouth daily/prednisone 10 mg by mouth daily - started on 04/20/2016- d/c on 04/02/2017 Xtandi160 mg po q day - startedon 04/06/2017-- d/c on 06/24/2017  Current Therapy:   Lupron 11.5 mg IM every 3 month - next dose March 2020  Zometa 4 mg IV q3 months - next dose March 2020 Flutamide 250 mg po q8hr - started06/12/2017   Interim History:  Mr. Degante is here today for follow-up.  He actually looks quite good.  He is feeling okay.  He really has had no specific complaints.  He says he is having no problems with pain.  He says that his pain control is quite good.  As far as his prostate cancer is concerned, he says he is taking his flutamide.  He is due for his Lupron in March.  His PSA back in December was 1.2.  This actually is up a little bit from what he has normally had.  We will have to watch this closely.  He is definitely castrate level testosterone.  His testosterone level is less than 3.  He has had no cardiac issues.  He has not fallen.  He has had no change in bowel or bladder habits.  He has had no cough.  Overall, his performance status is ECOG 1.  Medications:  Allergies as of 03/24/2018      Reactions   Contrast Media [iodinated Diagnostic Agents] Nausea And Vomiting   Patient vomits immediately after IV contrast is injected just prior to start of scan, was unable to scan patient in correct time frame.  Patient did not warn me of this when dosing the patient with oral CM and asking the patient history questions until the patient was on the table for the CT scan just prior to injection.  Ct Tech:  Karl Bales 07/29/17    Other Nausea And Vomiting   Patient vomits immediately after IV contrast is injected just prior to start of scan, was unable to scan patient in correct time frame.  Patient did not warn me of this when dosing the patient with oral CM and asking the patient history questions until the patient was on the table for the CT scan just prior to injection.  Ct Tech:  Karl Bales 07/29/17       Medication List       Accurate as of March 24, 2018  5:38 PM. Always use your most recent med list.        CALCIUM-VITAMIN D3 PO Take 1 tablet by mouth daily.   diltiazem 240 MG 24 hr capsule Commonly known as:  CARDIZEM CD TAKE 1 CAPSULE BY MOUTH EVERY DAY   dronabinol 5 MG capsule Commonly known as:  MARINOL Take 1 capsule (5 mg total) by mouth 2 (two) times daily before a meal.   fentaNYL 100 MCG/HR Commonly known as:  DURAGESIC Place 2 patches onto the skin every 3 (three) days.   flutamide 125 MG capsule Commonly known as:  EULEXIN TAKE 2 CAPSULES(250 MG) BY MOUTH THREE TIMES DAILY   megestrol 625 MG/5ML suspension Commonly known as:  MEGACE ES Take 5 mLs (625 mg total) by mouth 2 (two) times daily.   metoprolol tartrate 25 MG  tablet Commonly known as:  LOPRESSOR TAKE 1/2 TABLET(12.5 MG) BY MOUTH TWICE DAILY   NARCAN 4 MG/0.1ML Liqd nasal spray kit Generic drug:  naloxone NAR REP ALN   ondansetron 4 MG disintegrating tablet Commonly known as:  ZOFRAN ODT Take 1 tablet (4 mg total) by mouth every 8 (eight) hours as needed for nausea or vomiting.   ondansetron 4 MG tablet Commonly known as:  ZOFRAN TAKE 1 TABLET (4 MG TOTAL) BY MOUTH EVERY 6 (SIX) HOURS AS NEEDED FOR NAUSEA OR VOMITING.   oxyCODONE-acetaminophen 10-325 MG tablet Commonly known as:  PERCOCET Take 1 tablet by mouth every 6 (six) hours as needed for pain.   tamsulosin 0.4 MG Caps capsule Commonly known as:  FLOMAX TAKE 1 CAPSULE BY MOUTH EVERY DAY       Allergies:  Allergies  Allergen Reactions  . Contrast Media [Iodinated  Diagnostic Agents] Nausea And Vomiting    Patient vomits immediately after IV contrast is injected just prior to start of scan, was unable to scan patient in correct time frame.  Patient did not warn me of this when dosing the patient with oral CM and asking the patient history questions until the patient was on the table for the CT scan just prior to injection.  Ct Tech:  Karl Bales 07/29/17   . Other Nausea And Vomiting    Patient vomits immediately after IV contrast is injected just prior to start of scan, was unable to scan patient in correct time frame.  Patient did not warn me of this when dosing the patient with oral CM and asking the patient history questions until the patient was on the table for the CT scan just prior to injection.  Ct Tech:  Karl Bales 07/29/17     Past Medical History, Surgical history, Social history, and Family History were reviewed and updated.  Review of Systems: Review of Systems  Constitutional: Negative.   HENT: Negative.   Eyes: Negative.   Respiratory: Negative.   Cardiovascular: Negative.   Gastrointestinal: Negative.   Genitourinary: Negative.   Musculoskeletal: Negative.   Skin: Negative.   Neurological: Negative.   Endo/Heme/Allergies: Negative.   Psychiatric/Behavioral: Negative.      Physical Exam:  weight is 172 lb (78 kg). His oral temperature is 97.9 F (36.6 C). His blood pressure is 105/63 and his pulse is 76. His respiration is 20 and oxygen saturation is 98%.   Wt Readings from Last 3 Encounters:  03/24/18 172 lb (78 kg)  01/19/18 177 lb (80.3 kg)  11/04/17 162 lb (73.5 kg)    Physical Exam Vitals signs reviewed.  HENT:     Head: Normocephalic and atraumatic.  Eyes:     Pupils: Pupils are equal, round, and reactive to light.  Neck:     Musculoskeletal: Normal range of motion.  Cardiovascular:     Rate and Rhythm: Normal rate and regular rhythm.     Heart sounds: Normal heart sounds.  Pulmonary:     Effort: Pulmonary  effort is normal.     Breath sounds: Normal breath sounds.  Abdominal:     General: Bowel sounds are normal.     Palpations: Abdomen is soft.  Musculoskeletal: Normal range of motion.        General: No tenderness or deformity.  Lymphadenopathy:     Cervical: No cervical adenopathy.  Skin:    General: Skin is warm and dry.     Findings: No erythema or rash.  Neurological:  Mental Status: He is alert and oriented to person, place, and time.  Psychiatric:        Behavior: Behavior normal.        Thought Content: Thought content normal.        Judgment: Judgment normal.      Lab Results  Component Value Date   WBC 9.7 03/24/2018   HGB 13.5 03/24/2018   HCT 41.4 03/24/2018   MCV 91.4 03/24/2018   PLT 198 03/24/2018   Lab Results  Component Value Date   FERRITIN 216 11/04/2017   IRON 77 11/04/2017   TIBC 261 11/04/2017   UIBC 184 11/04/2017   IRONPCTSAT 30 (L) 11/04/2017   Lab Results  Component Value Date   RBC 4.53 03/24/2018   Lab Results  Component Value Date   KAPLAMBRATIO 0.89 03/28/2016   Lab Results  Component Value Date   IGGSERUM 1,066 03/28/2016   IGMSERUM 106 03/28/2016   No results found for: Odetta Pink, SPEI   Chemistry      Component Value Date/Time   NA 140 03/24/2018 1437   NA 143 12/16/2016 1022   K 4.0 03/24/2018 1437   K 3.5 12/16/2016 1022   CL 103 03/24/2018 1437   CL 101 12/16/2016 1022   CO2 28 03/24/2018 1437   CO2 33 12/16/2016 1022   BUN 8 03/24/2018 1437   BUN 4 (L) 12/16/2016 1022   CREATININE 0.80 03/24/2018 1437   CREATININE 0.8 12/16/2016 1022      Component Value Date/Time   CALCIUM 9.8 03/24/2018 1437   CALCIUM 9.4 12/16/2016 1022   ALKPHOS 93 03/24/2018 1437   ALKPHOS 77 12/16/2016 1022   AST 29 03/24/2018 1437   ALT 26 03/24/2018 1437   ALT 16 12/16/2016 1022   BILITOT 0.4 03/24/2018 1437       Impression and Plan: Mr. Dutton is a very pleasant 58  yo African American gentleman with metastatic prostate cancer.   His last PET scan was done back in February of 2019.  As long as his PSA is normal, I really do not think a PET scan is going to help [place] out.   I will plan to have him come back in 1 month.  When he comes back, we will give him Lupron and also his Zometa.  I am just happy that his quality of life is doing well.  This is quite encouraging.    [name], MD 2/26/20205:38 PM

## 2018-03-25 LAB — TESTOSTERONE: Testosterone: <3 ng/dL — ABNORMAL LOW (ref 264–916)

## 2018-03-25 LAB — PROSTATE-SPECIFIC AG, SERUM (LABCORP): Prostate Specific Ag, Serum: 12.5 ng/mL — ABNORMAL HIGH (ref 0.0–4.0)

## 2018-03-26 LAB — URINE CULTURE: Culture: NO GROWTH

## 2018-03-26 IMAGING — CT CT L SPINE W/O CM
3 series · 12 of 33 positions shown, 14 images · non-contrast
Comparison: Radiographs dated 03/15/2016 and 10/03/2006

CLINICAL DATA: Low back pain.  Leg pain.

EXAM:
CT LUMBAR SPINE WITHOUT CONTRAST
TECHNIQUE: Multidetector CT imaging of the lumbar spine was performed without
intravenous contrast administration. Multiplanar CT image
reconstructions were also generated.

[Series 4: l spine soft · axial · 0.38mm/px · z∈[-256,-76]mm · 4 of 132 slices shown, 5 images]
[im 21/132  soft-tissue]
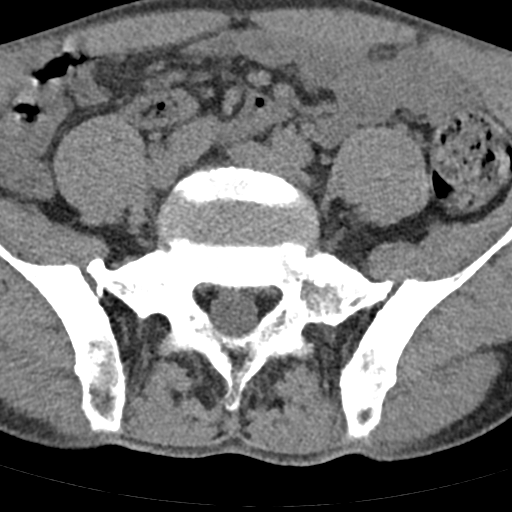
[im 21/132  bone]
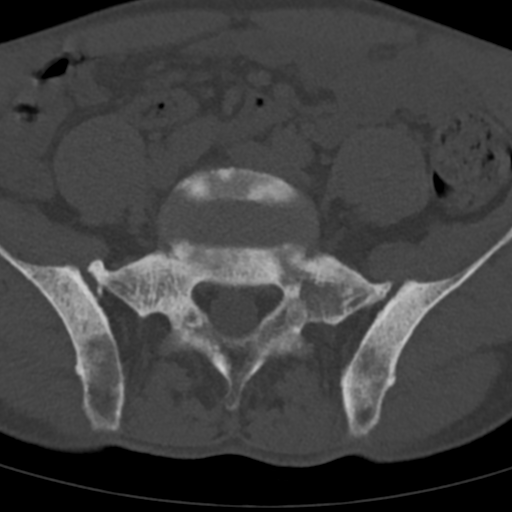
[im 51/132  bone]
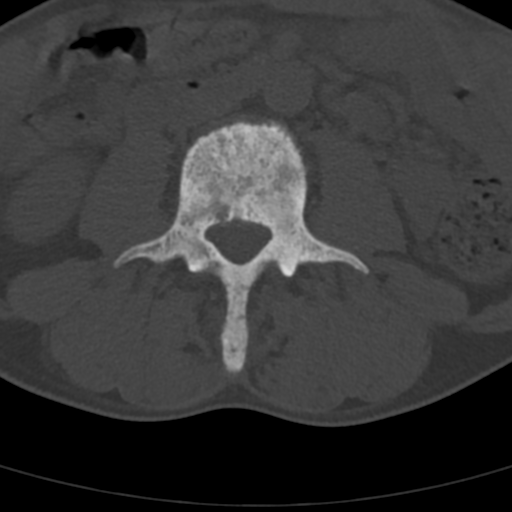
[im 81/132  bone]
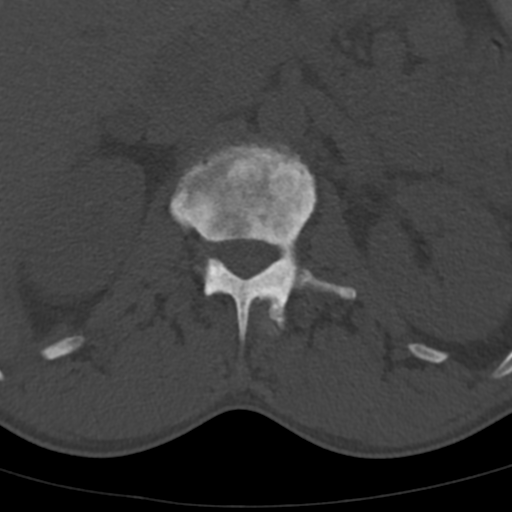
[im 111/132  bone]
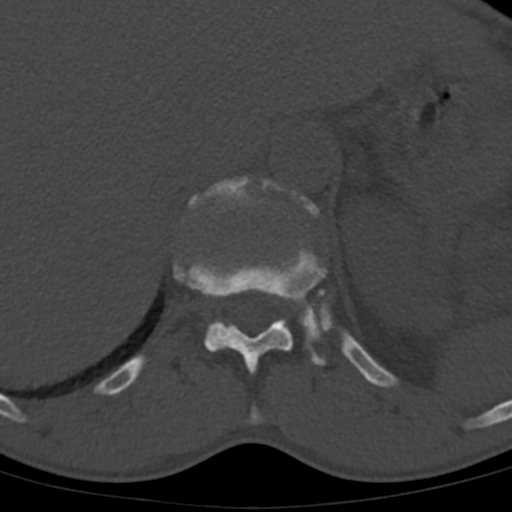

[Series 5: sagittal bone · sagittal · 0.34mm/px · 5 of 75 slices shown, 6 images]
[im 25/75  bone]
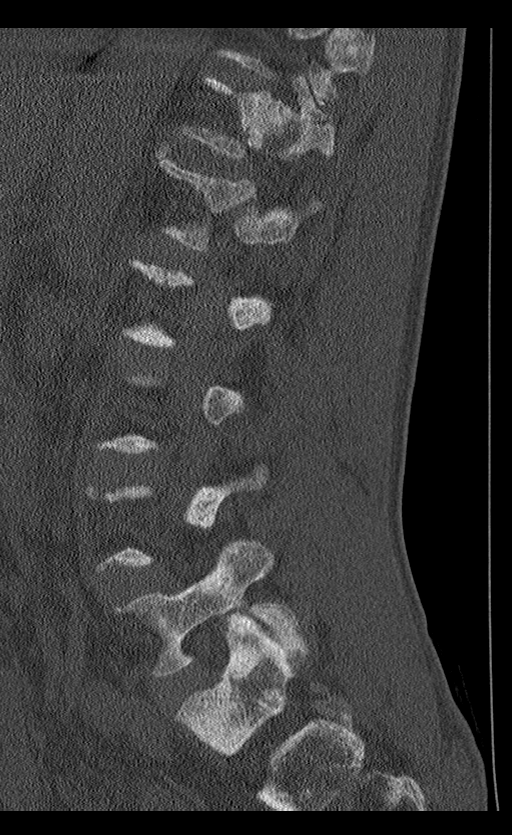
[im 31/75  bone]
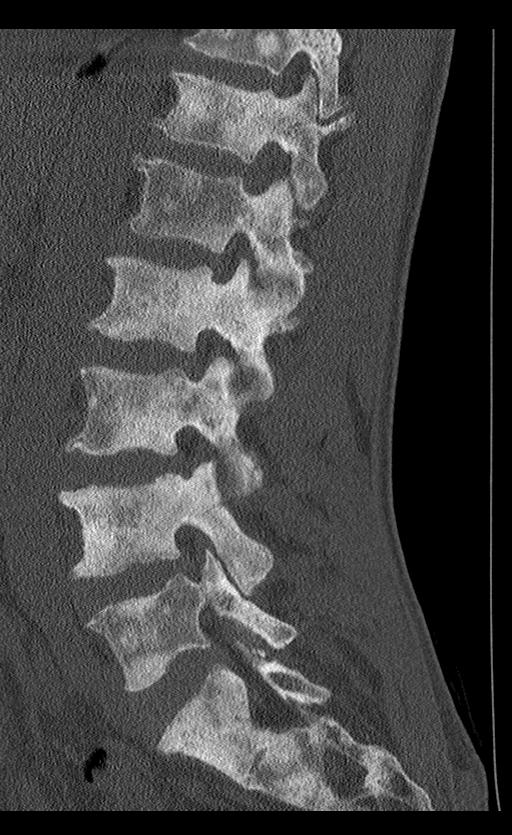
[im 38/75  soft-tissue]
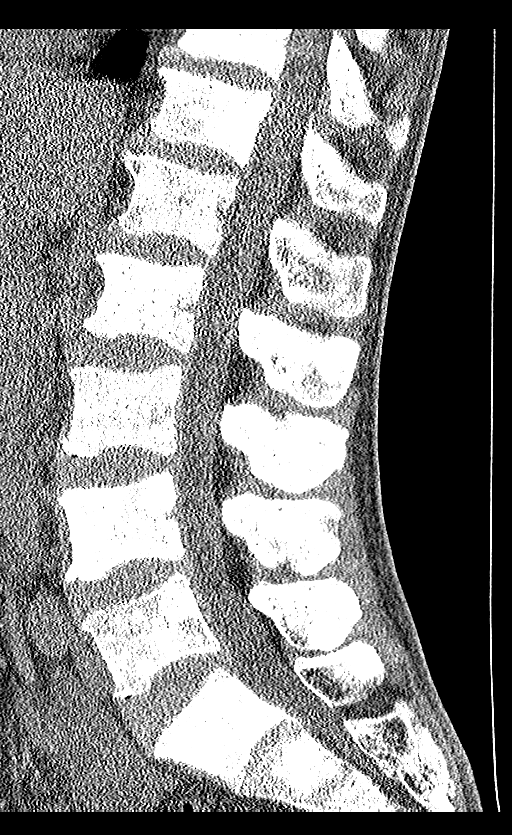
[im 38/75  bone]
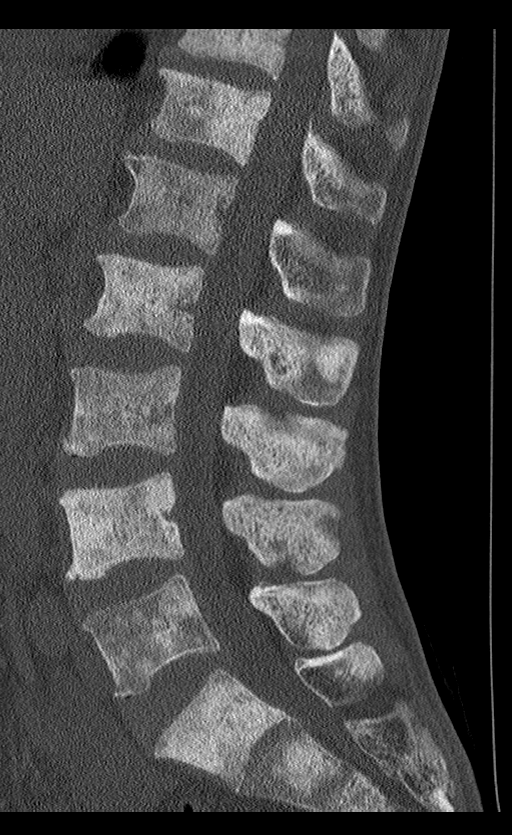
[im 44/75  bone]
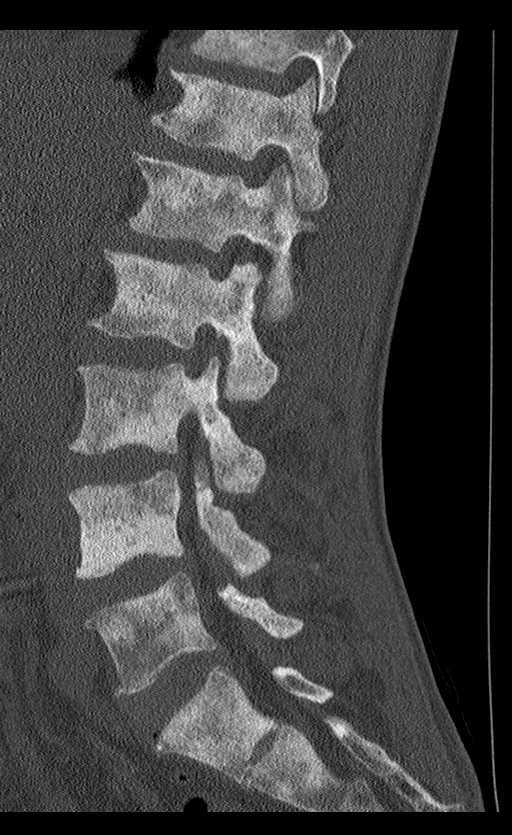
[im 50/75  bone]
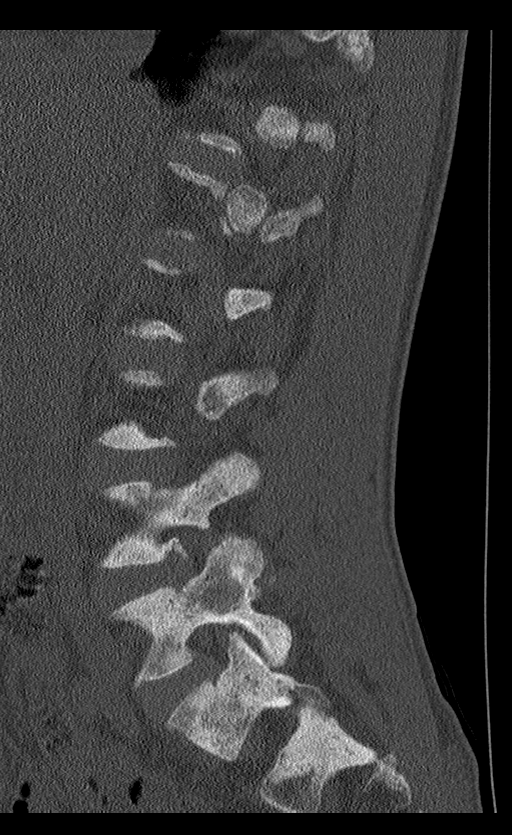

[Series 6: coronal bone · coronal · 0.30mm/px · 3 of 82 slices shown]
[im 17/82  bone]
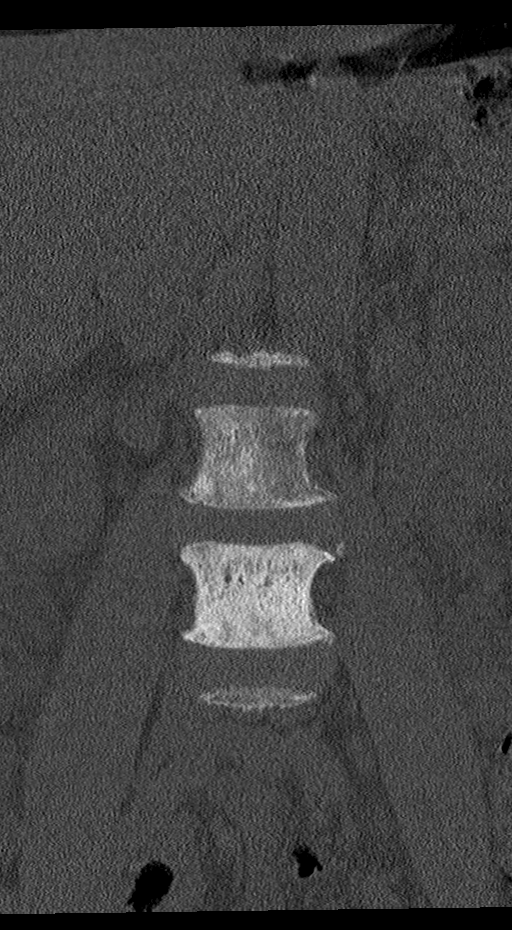
[im 33/82  bone]
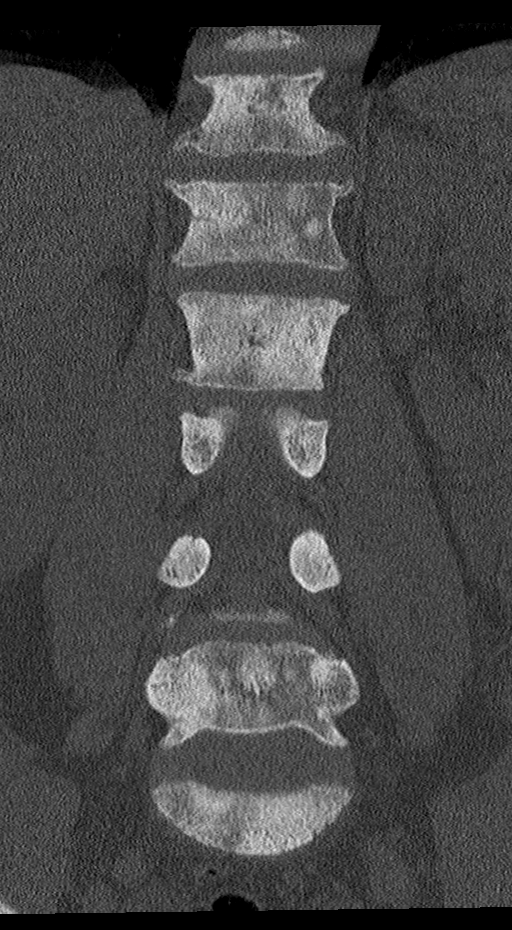
[im 49/82  bone]
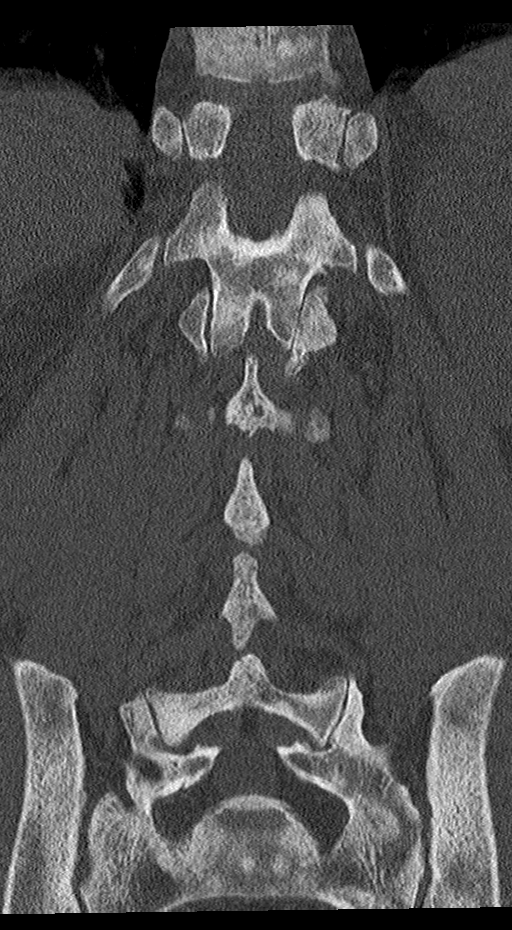

[12 of 33 positions shown; findings below may reference images not displayed]

FINDINGS: Segmentation: 13 rib-bearing vertebra. Four typical lumbar segments.
I will term the lowest 5 segments as L1 through L5 for purposes of
this exam.

Alignment: Normal.

Vertebrae: Extensive sclerotic metastatic disease throughout the
entire visualized portion of the skeleton including the lower
thoracic and lumbar spine, ribs, and the visualized portions of the
pelvic bones.

Paraspinal and other soft tissues: Normal.  No discrete adenopathy.

Disc levels: The discs are normal throughout the lumbar spine. No
visible tumor extension into the spinal canal.
IMPRESSION: Extensive blastic metastatic disease throughout the entire
visualized portion of the skeleton. This is most likely due to
metastatic prostate cancer.

No current pathologic fractures. No disc protrusions or discrete
extension of tumor into the spinal canal.

## 2018-03-28 IMAGING — CR DG CHEST 2V
2 series · 2 of 2 positions shown · non-contrast
Comparison: 10/03/2006 thoracic spine radiographs

CLINICAL DATA: Back pain without radiculopathy

EXAM:
CHEST  2 VIEW

[w chest pa]
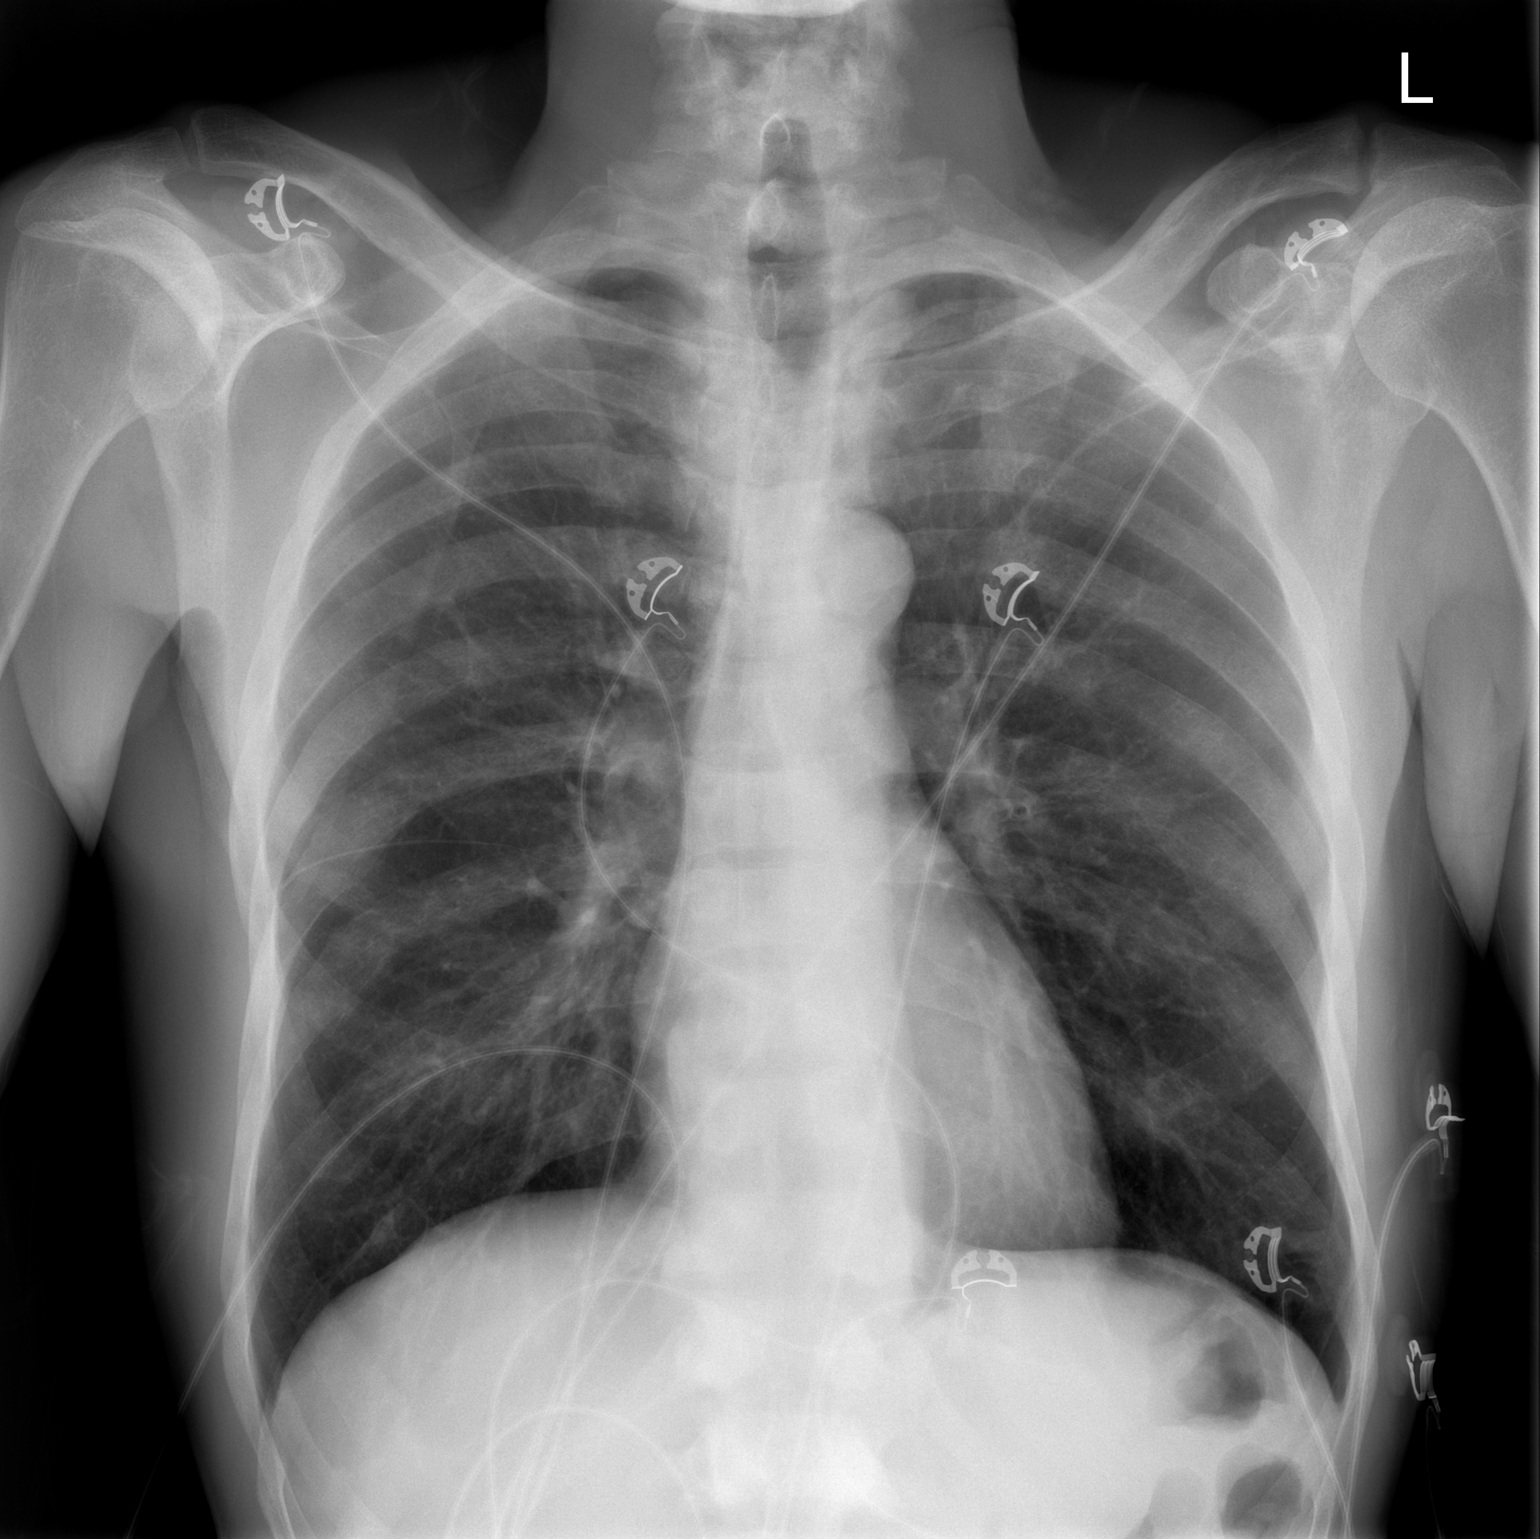

[w chest lat]
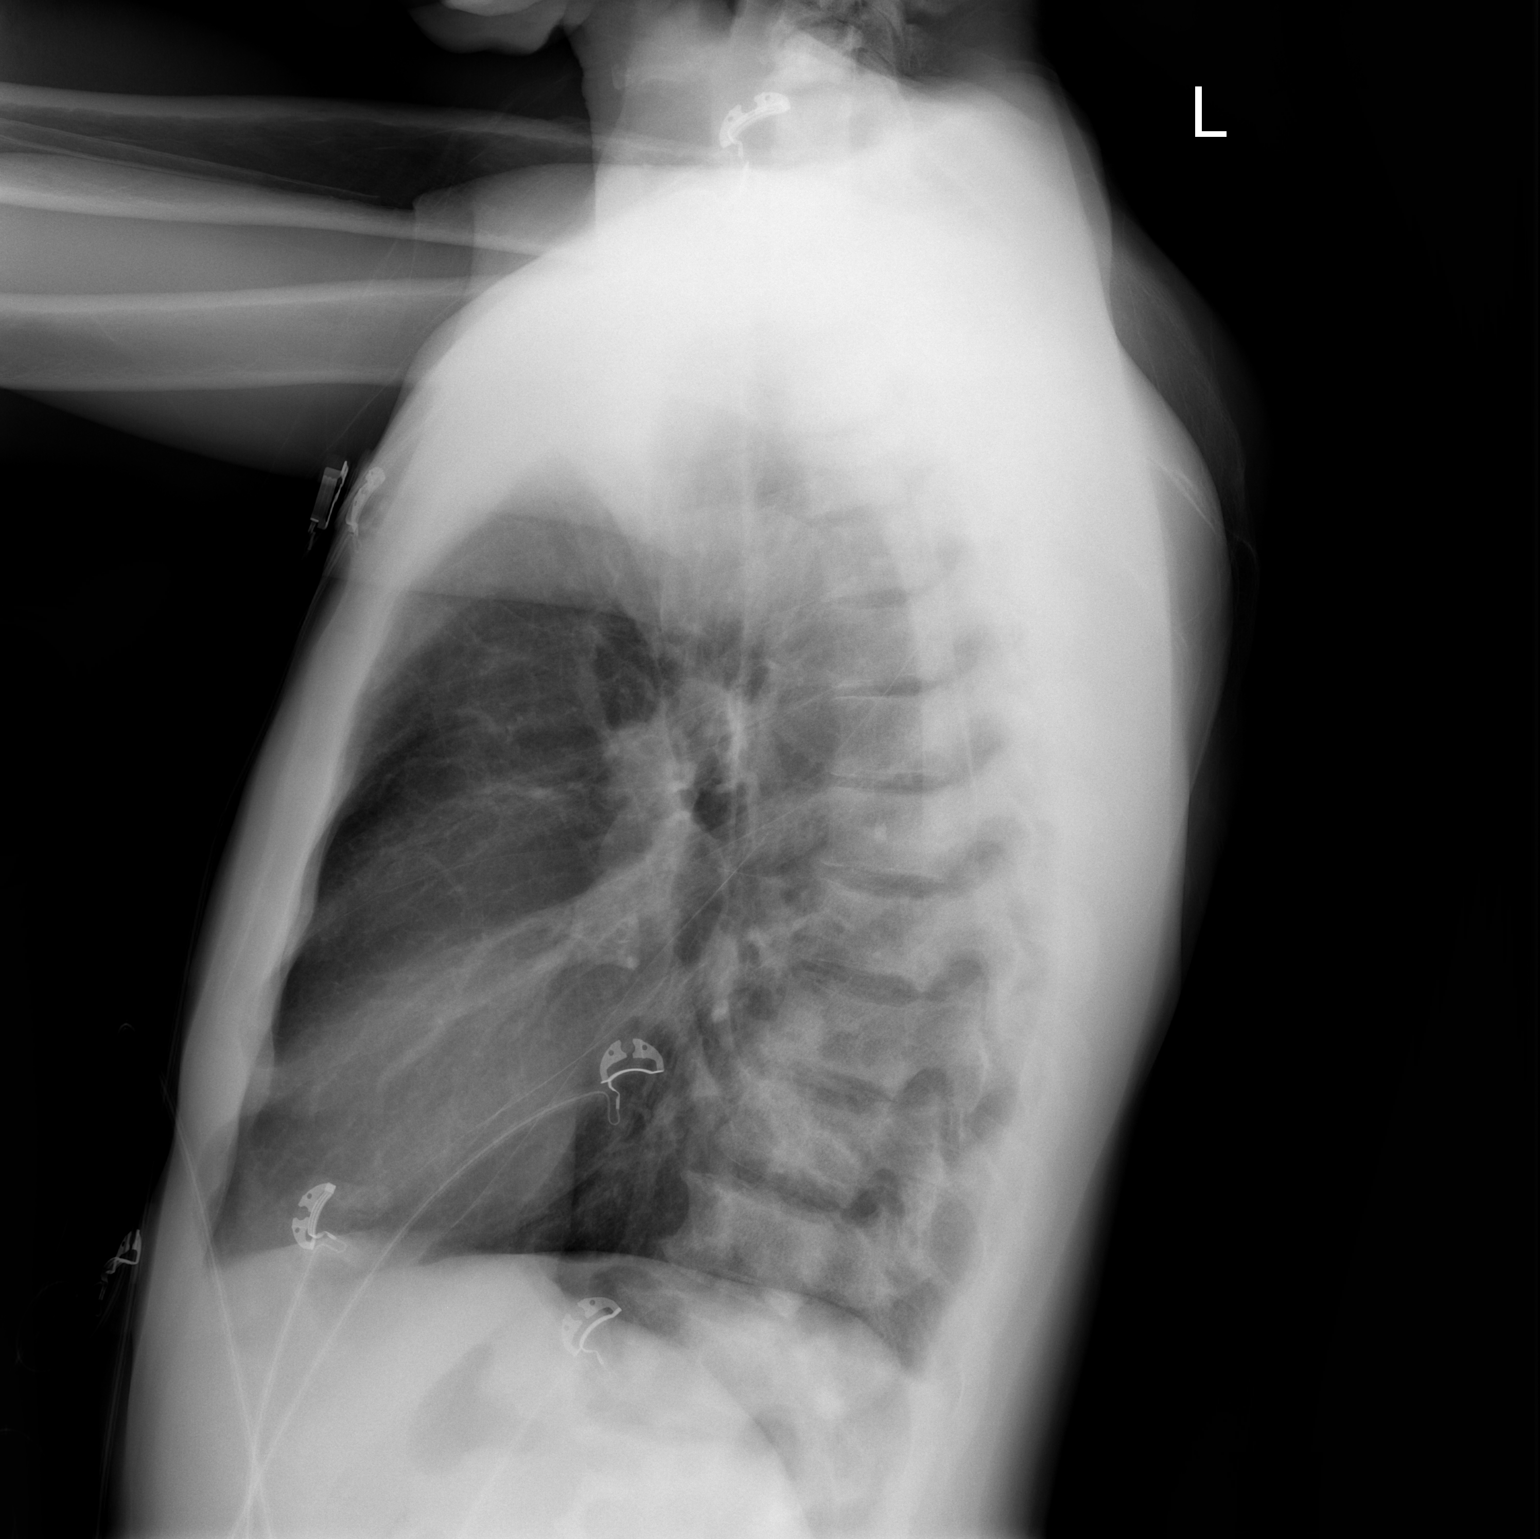

[2 of 2 positions shown; findings below may reference images not displayed]

FINDINGS: The heart size and mediastinal contours are within normal limits.
Both lungs are clear. Sclerosis of the ribs and dorsal spine
concerning for osteoblastic disease. This is a new finding since
prior. No acute fracture.
IMPRESSION: Sclerotic appearance of the dorsal spine and ribs concerning for
interval development of osteoblastic disease. No acute fracture
identified.

## 2018-04-02 ENCOUNTER — Telehealth: Payer: Self-pay | Admitting: *Deleted

## 2018-04-02 NOTE — Telephone Encounter (Signed)
Per Laverna Peace, NP, I informed the patient that his urinalysis was negative. The urine culture was negative. No need for antibiotics. He verbalized understanding.

## 2018-04-05 ENCOUNTER — Other Ambulatory Visit: Payer: Self-pay | Admitting: Hematology & Oncology

## 2018-04-14 ENCOUNTER — Other Ambulatory Visit: Payer: Self-pay | Admitting: *Deleted

## 2018-04-14 DIAGNOSIS — C7951 Secondary malignant neoplasm of bone: Principal | ICD-10-CM

## 2018-04-14 DIAGNOSIS — R52 Pain, unspecified: Secondary | ICD-10-CM

## 2018-04-14 DIAGNOSIS — M5441 Lumbago with sciatica, right side: Secondary | ICD-10-CM

## 2018-04-14 DIAGNOSIS — G8929 Other chronic pain: Secondary | ICD-10-CM

## 2018-04-14 DIAGNOSIS — M5442 Lumbago with sciatica, left side: Secondary | ICD-10-CM

## 2018-04-14 DIAGNOSIS — C61 Malignant neoplasm of prostate: Secondary | ICD-10-CM

## 2018-04-14 MED ORDER — FENTANYL 100 MCG/HR TD PT72: 2.0000 | MEDICATED_PATCH | TRANSDERMAL | 0 refills | Status: DC

## 2018-04-14 MED ORDER — OXYCODONE-ACETAMINOPHEN 10-325 MG PO TABS: 1.0000 | ORAL_TABLET | Freq: Four times a day (QID) | ORAL | 0 refills | Status: DC | PRN

## 2018-04-23 ENCOUNTER — Inpatient Hospital Stay: Payer: Medicaid Other | Attending: Hematology & Oncology | Admitting: Hematology & Oncology

## 2018-04-23 ENCOUNTER — Inpatient Hospital Stay: Payer: Medicaid Other

## 2018-04-23 ENCOUNTER — Other Ambulatory Visit: Payer: Self-pay

## 2018-04-23 VITALS — BP 124/82 | HR 89 | Temp 98.1°F | Resp 18 | Wt 174.5 lb

## 2018-04-23 DIAGNOSIS — Z5111 Encounter for antineoplastic chemotherapy: Secondary | ICD-10-CM | POA: Diagnosis present

## 2018-04-23 DIAGNOSIS — C7951 Secondary malignant neoplasm of bone: Secondary | ICD-10-CM

## 2018-04-23 DIAGNOSIS — C61 Malignant neoplasm of prostate: Secondary | ICD-10-CM | POA: Diagnosis not present

## 2018-04-23 LAB — CBC WITH DIFFERENTIAL (CANCER CENTER ONLY)
ABS IMMATURE GRANULOCYTES: 0.03 10*3/uL (ref 0.00–0.07)
Basophils Absolute: 0 10*3/uL (ref 0.0–0.1)
Basophils Relative: 0 %
Eosinophils Absolute: 0.2 10*3/uL (ref 0.0–0.5)
Eosinophils Relative: 3 %
HCT: 38.5 % — ABNORMAL LOW (ref 39.0–52.0)
Hemoglobin: 12.6 g/dL — ABNORMAL LOW (ref 13.0–17.0)
Immature Granulocytes: 0 %
Lymphocytes Relative: 22 %
Lymphs Abs: 2 10*3/uL (ref 0.7–4.0)
MCH: 29.4 pg (ref 26.0–34.0)
MCHC: 32.7 g/dL (ref 30.0–36.0)
MCV: 90 fL (ref 80.0–100.0)
MONO ABS: 0.6 10*3/uL (ref 0.1–1.0)
Monocytes Relative: 7 %
Neutro Abs: 6 10*3/uL (ref 1.7–7.7)
Neutrophils Relative %: 68 %
Platelet Count: 223 10*3/uL (ref 150–400)
RBC: 4.28 MIL/uL (ref 4.22–5.81)
RDW: 15.4 % (ref 11.5–15.5)
WBC Count: 8.8 10*3/uL (ref 4.0–10.5)
nRBC: 0 % (ref 0.0–0.2)

## 2018-04-23 LAB — CMP (CANCER CENTER ONLY)
ALT: 55 U/L — AB (ref 0–44)
AST: 41 U/L (ref 15–41)
Albumin: 4.7 g/dL (ref 3.5–5.0)
Alkaline Phosphatase: 85 U/L (ref 38–126)
Anion gap: 10 (ref 5–15)
BILIRUBIN TOTAL: 0.4 mg/dL (ref 0.3–1.2)
BUN: 7 mg/dL (ref 6–20)
CO2: 22 mmol/L (ref 22–32)
Calcium: 9.2 mg/dL (ref 8.9–10.3)
Chloride: 107 mmol/L (ref 98–111)
Creatinine: 1.09 mg/dL (ref 0.61–1.24)
GFR, Est AFR Am: >60 mL/min (ref >60)
GFR, Estimated: >60 mL/min (ref >60)
GLUCOSE: 169 mg/dL — AB (ref 70–99)
Potassium: 3.8 mmol/L (ref 3.5–5.1)
Sodium: 139 mmol/L (ref 135–145)
Total Protein: 7.7 g/dL (ref 6.5–8.1)

## 2018-04-23 MED ORDER — LEUPROLIDE ACETATE (3 MONTH) 11.25 MG IM KIT
11.2500 mg | PACK | Freq: Once | INTRAMUSCULAR | Status: AC
  Administered 2018-04-23: 11.25 mg via INTRAMUSCULAR
  Filled 2018-04-23: qty 11.25

## 2018-04-23 MED ORDER — ZOLEDRONIC ACID 4 MG/100ML IV SOLN
4.0000 mg | Freq: Once | INTRAVENOUS | Status: AC
  Administered 2018-04-23: 4 mg via INTRAVENOUS
  Filled 2018-04-23: qty 100

## 2018-04-23 MED ORDER — SODIUM CHLORIDE 0.9 % IV SOLN
INTRAVENOUS | Status: DC
  Administered 2018-04-23: 12:00:00 via INTRAVENOUS
  Filled 2018-04-23: qty 250

## 2018-04-23 MED ORDER — APALUTAMIDE 60 MG PO TABS: 240.0000 mg | ORAL_TABLET | Freq: Every day | ORAL | 6 refills | Status: DC

## 2018-04-23 NOTE — Patient Instructions (Signed)

## 2018-04-23 NOTE — Progress Notes (Signed)
Hematology and Oncology Follow Up Visit  CODIE KROGH [id number] 11-09-1960 58 y.o. 04/23/2018   Principle Diagnosis:  Metastatic prostate cancer - androgen sensitive  Past Therapy: 14 fractions of radiation to the lumbar spine Casodex 50 mg by mouth daily- stopped 04/02/2017 due to progression Zytiga 1000 mg by mouth daily/prednisone 10 mg by mouth daily - started on 04/20/2016- d/c on 04/02/2017 Xtandi160 mg po q day - startedon 04/06/2017-- d/c on 06/24/2017  Current Therapy:   Lupron 11.5 mg IM every 3 month - next dose June 2020  Zometa 4 mg IV q3 months - next dose June 2020 Flutamide 250 mg po q8hr - started06/12/2017 -- d/c on 04/23/18 Apalutamide 240 mg po q day -- start on 04/28/2018   Interim History:  Mr. Oldaker is here today for follow-up.  So far, he has done quite well.  Unfortunately, we now have a problem.  His PSA has gone up.  We last saw him in February, the PSA was up to 12.5.  He actually is done incredibly well with the flutamide.  I am going to stop this.  I will now start him on apalutamide.  The dose will be 240 mg p.o. daily.  Hopefully, this will get his PSA back down.  He has had no complaints.  There is no pain in.  He has had no cough or shortness of breath.  He has had no nausea or vomiting.  There is been no bleeding.  His last testosterone level was less than 3.  He clearly is castrate level testosterone.  Thankfully, his wife has been doing well.  She is not in the hospital.  Overall, his performance status is ECOG 1.  Medications:  Allergies as of 04/23/2018      Reactions   Contrast Media [iodinated Diagnostic Agents] Nausea And Vomiting   Patient vomits immediately after IV contrast is injected just prior to start of scan, was unable to scan patient in correct time frame.  Patient did not warn me of this when dosing the patient with oral CM and asking the patient history questions until the patient was on the table for the CT scan  just prior to injection.  Ct Tech:  Karl Bales 07/29/17    Other Nausea And Vomiting   Patient vomits immediately after IV contrast is injected just prior to start of scan, was unable to scan patient in correct time frame.  Patient did not warn me of this when dosing the patient with oral CM and asking the patient history questions until the patient was on the table for the CT scan just prior to injection.  Ct Tech:  Karl Bales 07/29/17       Medication List       Accurate as of April 23, 2018 11:24 AM. Always use your most recent med list.        CALCIUM-VITAMIN D3 PO Take 1 tablet by mouth daily.   diltiazem 240 MG 24 hr capsule Commonly known as:  CARDIZEM CD TAKE 1 CAPSULE BY MOUTH EVERY DAY   dronabinol 5 MG capsule Commonly known as:  MARINOL Take 1 capsule (5 mg total) by mouth 2 (two) times daily before a meal.   fentaNYL 100 MCG/HR Commonly known as:  DURAGESIC Place 2 patches onto the skin every 3 (three) days.   flutamide 125 MG capsule Commonly known as:  EULEXIN TAKE 2 CAPSULES(250 MG) BY MOUTH THREE TIMES DAILY   megestrol 625 MG/5ML suspension Commonly known as:  MEGACE ES Take 5 mLs (625 mg total) by mouth 2 (two) times daily.   metoprolol tartrate 25 MG tablet Commonly known as:  LOPRESSOR TAKE 1/2 TABLET(12.5 MG) BY MOUTH TWICE DAILY   Narcan 4 MG/0.1ML Liqd nasal spray kit Generic drug:  naloxone NAR REP ALN   ondansetron 4 MG disintegrating tablet Commonly known as:  Zofran ODT Take 1 tablet (4 mg total) by mouth every 8 (eight) hours as needed for nausea or vomiting.   ondansetron 4 MG tablet Commonly known as:  ZOFRAN TAKE 1 TABLET (4 MG TOTAL) BY MOUTH EVERY 6 (SIX) HOURS AS NEEDED FOR NAUSEA OR VOMITING.   oxyCODONE-acetaminophen 10-325 MG tablet Commonly known as:  Percocet Take 1 tablet by mouth every 6 (six) hours as needed for pain.   tamsulosin 0.4 MG Caps capsule Commonly known as:  FLOMAX TAKE 1 CAPSULE BY MOUTH EVERY DAY        Allergies:  Allergies  Allergen Reactions  . Contrast Media [Iodinated Diagnostic Agents] Nausea And Vomiting    Patient vomits immediately after IV contrast is injected just prior to start of scan, was unable to scan patient in correct time frame.  Patient did not warn me of this when dosing the patient with oral CM and asking the patient history questions until the patient was on the table for the CT scan just prior to injection.  Ct Tech:  Karl Bales 07/29/17   . Other Nausea And Vomiting    Patient vomits immediately after IV contrast is injected just prior to start of scan, was unable to scan patient in correct time frame.  Patient did not warn me of this when dosing the patient with oral CM and asking the patient history questions until the patient was on the table for the CT scan just prior to injection.  Ct Tech:  Karl Bales 07/29/17     Past Medical History, Surgical history, Social history, and Family History were reviewed and updated.  Review of Systems: Review of Systems  Constitutional: Negative.   HENT: Negative.   Eyes: Negative.   Respiratory: Negative.   Cardiovascular: Negative.   Gastrointestinal: Negative.   Genitourinary: Negative.   Musculoskeletal: Negative.   Skin: Negative.   Neurological: Negative.   Endo/Heme/Allergies: Negative.   Psychiatric/Behavioral: Negative.      Physical Exam:  weight is 174 lb 8 oz (79.2 kg). His oral temperature is 98.1 F (36.7 C). His blood pressure is 124/82 and his pulse is 89. His respiration is 18 and oxygen saturation is 98%.   Wt Readings from Last 3 Encounters:  04/23/18 174 lb 8 oz (79.2 kg)  03/24/18 172 lb (78 kg)  01/19/18 177 lb (80.3 kg)    Physical Exam Vitals signs reviewed.  HENT:     Head: Normocephalic and atraumatic.  Eyes:     Pupils: Pupils are equal, round, and reactive to light.  Neck:     Musculoskeletal: Normal range of motion.  Cardiovascular:     Rate and Rhythm: Normal rate  and regular rhythm.     Heart sounds: Normal heart sounds.  Pulmonary:     Effort: Pulmonary effort is normal.     Breath sounds: Normal breath sounds.  Abdominal:     General: Bowel sounds are normal.     Palpations: Abdomen is soft.  Musculoskeletal: Normal range of motion.        General: No tenderness or deformity.  Lymphadenopathy:     Cervical: No cervical adenopathy.  Skin:    General: Skin is warm and dry.     Findings: No erythema or rash.  Neurological:     Mental Status: He is alert and oriented to person, place, and time.  Psychiatric:        Behavior: Behavior normal.        Thought Content: Thought content normal.        Judgment: Judgment normal.      Lab Results  Component Value Date   WBC 8.8 04/23/2018   HGB 12.6 (L) 04/23/2018   HCT 38.5 (L) 04/23/2018   MCV 90.0 04/23/2018   PLT 223 04/23/2018   Lab Results  Component Value Date   FERRITIN 216 11/04/2017   IRON 77 11/04/2017   TIBC 261 11/04/2017   UIBC 184 11/04/2017   IRONPCTSAT 30 (L) 11/04/2017   Lab Results  Component Value Date   RBC 4.28 04/23/2018   Lab Results  Component Value Date   KAPLAMBRATIO 0.89 03/28/2016   Lab Results  Component Value Date   IGGSERUM 1,066 03/28/2016   IGMSERUM 106 03/28/2016   No results found for: Odetta Pink, SPEI   Chemistry      Component Value Date/Time   NA 139 04/23/2018 1030   NA 143 12/16/2016 1022   K 3.8 04/23/2018 1030   K 3.5 12/16/2016 1022   CL 107 04/23/2018 1030   CL 101 12/16/2016 1022   CO2 22 04/23/2018 1030   CO2 33 12/16/2016 1022   BUN 7 04/23/2018 1030   BUN 4 (L) 12/16/2016 1022   CREATININE 1.09 04/23/2018 1030   CREATININE 0.8 12/16/2016 1022      Component Value Date/Time   CALCIUM 9.2 04/23/2018 1030   CALCIUM 9.4 12/16/2016 1022   ALKPHOS 85 04/23/2018 1030   ALKPHOS 77 12/16/2016 1022   AST 41 04/23/2018 1030   ALT 55 (H) 04/23/2018 1030   ALT 16  12/16/2016 1022   BILITOT 0.4 04/23/2018 1030       Impression and Plan: Mr. Sahni is a very pleasant 58 yo African American gentleman with metastatic prostate cancer.  For right now, we will see how he does with the apalutamide.  I will not do a prostate-specific PET scan on him.  We will go ahead with his Lupron today.  He will get his Zometa today also.  I spent about 30 minutes with him today.  I had to talk to him about his PSA and talked about making a change in his medications.  I ordered the apalutamide.  He understands that he needs to stop the flutamide.  Hopefully he will get the apalutamide in a few days.  We will see him back in a month to maintain follow-up since we did make a medication change.  [name], MD 3/27/202011:24 AM

## 2018-04-24 LAB — PSA, TOTAL AND FREE
PSA, Free Pct: 4.3 %
PSA, Free: 1.86 ng/mL
Prostate Specific Ag, Serum: 43.2 ng/mL — ABNORMAL HIGH (ref 0.0–4.0)

## 2018-04-24 LAB — TESTOSTERONE: Testosterone: <3 ng/dL — ABNORMAL LOW (ref 264–916)

## 2018-04-26 ENCOUNTER — Telehealth: Payer: Self-pay | Admitting: Pharmacist

## 2018-04-26 MED FILL — ERLEADA 60 MG TABS: 60 | 30 days supply | Qty: 120 | Fill #0

## 2018-04-26 NOTE — Telephone Encounter (Signed)
Oral Oncology Pharmacist Encounter  Received new prescription for Erleada (apalutamide) for the treatment of metastatic prostate cancer in conjunction with Lupron, planned duration until disease progression or unacceptable drug toxicity.  Prescription dose and frequency assessed.   Current medication list in Epic reviewed, several DDIs with Erleada identified: - Erleada may decrease the serum concentration of ondansetron, percocet, tamsulosin, dronabinol, and fentanyl. Monitor patient for decreased effectiveness of the listed medications. No baseline dose adjustment needed.   Prescription has been e-scribed to the [facility] for benefits analysis and approval.  Oral Oncology Clinic will continue to follow for initial counseling and start date.  Darl Pikes, PharmD, BCPS, [facility] Hematology/Oncology Clinical Pharmacist [facility] [phone]  04/26/2018 9:04 AM

## 2018-04-26 NOTE — Telephone Encounter (Signed)
Oral Chemotherapy Pharmacist Encounter  Medication will be delivered to Ryan Davis on 04/28/18. He knows to start when her receives the medication.   Patient Education I spoke with patient for overview of new oral chemotherapy medication: Erleada (apalutamide) for the treatment of metastatic prostate cancer in conjunction with Lupron, planned duration until disease progression or unacceptable drug toxicity.  Counseled patient on administration, dosing, side effects, monitoring, drug-food interactions, safe handling, storage, and disposal. Patient will take 4 tablets (240 mg total) by mouth daily. May be taken with or without food.   Side effects include but not limited to: decreased hgb/wbc, fatigue, HT, falls .    Reviewed with patient importance of keeping a medication schedule and plan for any missed doses.  [name] voiced understanding and appreciation. All questions answered. Medication handout placed in the mail.  Provided patient with [facility] phone number. Patient knows to call the office with questions or concerns. Oral Chemotherapy Navigation Clinic will continue to follow.  Darl Pikes, PharmD, BCPS, [facility] Hematology/Oncology Clinical Pharmacist [facility] [phone]  04/26/2018 10:03 AM

## 2018-05-03 ENCOUNTER — Other Ambulatory Visit: Payer: Self-pay | Admitting: Hematology & Oncology

## 2018-05-14 ENCOUNTER — Other Ambulatory Visit: Payer: Self-pay | Admitting: *Deleted

## 2018-05-14 DIAGNOSIS — G8929 Other chronic pain: Secondary | ICD-10-CM

## 2018-05-14 DIAGNOSIS — C7951 Secondary malignant neoplasm of bone: Secondary | ICD-10-CM

## 2018-05-14 DIAGNOSIS — C61 Malignant neoplasm of prostate: Secondary | ICD-10-CM

## 2018-05-14 DIAGNOSIS — M5441 Lumbago with sciatica, right side: Secondary | ICD-10-CM

## 2018-05-14 DIAGNOSIS — R52 Pain, unspecified: Secondary | ICD-10-CM

## 2018-05-14 DIAGNOSIS — M5442 Lumbago with sciatica, left side: Secondary | ICD-10-CM

## 2018-05-14 MED ORDER — FENTANYL 100 MCG/HR TD PT72: 2.0000 | MEDICATED_PATCH | TRANSDERMAL | 0 refills | Status: DC

## 2018-05-14 MED ORDER — OXYCODONE-ACETAMINOPHEN 10-325 MG PO TABS: 1.0000 | ORAL_TABLET | Freq: Four times a day (QID) | ORAL | 0 refills | Status: DC | PRN

## 2018-05-18 ENCOUNTER — Other Ambulatory Visit: Payer: Self-pay

## 2018-05-18 ENCOUNTER — Inpatient Hospital Stay: Payer: Medicaid Other | Attending: Hematology & Oncology

## 2018-05-18 ENCOUNTER — Other Ambulatory Visit: Payer: Self-pay | Admitting: Family

## 2018-05-18 DIAGNOSIS — C61 Malignant neoplasm of prostate: Secondary | ICD-10-CM | POA: Insufficient documentation

## 2018-05-18 DIAGNOSIS — C7951 Secondary malignant neoplasm of bone: Secondary | ICD-10-CM | POA: Diagnosis present

## 2018-05-18 DIAGNOSIS — Z79899 Other long term (current) drug therapy: Secondary | ICD-10-CM | POA: Insufficient documentation

## 2018-05-18 DIAGNOSIS — R399 Unspecified symptoms and signs involving the genitourinary system: Secondary | ICD-10-CM

## 2018-05-18 LAB — URINALYSIS, COMPLETE (UACMP) WITH MICROSCOPIC
Bilirubin Urine: NEGATIVE
Glucose, UA: NEGATIVE mg/dL
Hgb urine dipstick: NEGATIVE
Ketones, ur: NEGATIVE mg/dL
Leukocytes,Ua: NEGATIVE
Nitrite: NEGATIVE
Protein, ur: NEGATIVE mg/dL
Specific Gravity, Urine: 1.02 (ref 1.005–1.030)
pH: 8.5 — ABNORMAL HIGH (ref 5.0–8.0)

## 2018-05-19 ENCOUNTER — Other Ambulatory Visit: Payer: Self-pay | Admitting: Family

## 2018-05-19 DIAGNOSIS — R399 Unspecified symptoms and signs involving the genitourinary system: Secondary | ICD-10-CM

## 2018-05-19 LAB — URINE CULTURE

## 2018-05-20 ENCOUNTER — Inpatient Hospital Stay: Payer: Medicaid Other

## 2018-05-20 ENCOUNTER — Encounter: Payer: Self-pay | Admitting: Family

## 2018-05-20 ENCOUNTER — Other Ambulatory Visit: Payer: Self-pay

## 2018-05-20 ENCOUNTER — Inpatient Hospital Stay (HOSPITAL_BASED_OUTPATIENT_CLINIC_OR_DEPARTMENT_OTHER): Payer: Medicaid Other | Admitting: Family

## 2018-05-20 VITALS — BP 105/83 | HR 78 | Temp 98.1°F | Resp 17 | Ht 74.0 in | Wt 166.2 lb

## 2018-05-20 DIAGNOSIS — Z79899 Other long term (current) drug therapy: Secondary | ICD-10-CM | POA: Diagnosis not present

## 2018-05-20 DIAGNOSIS — R399 Unspecified symptoms and signs involving the genitourinary system: Secondary | ICD-10-CM

## 2018-05-20 DIAGNOSIS — C7951 Secondary malignant neoplasm of bone: Secondary | ICD-10-CM

## 2018-05-20 DIAGNOSIS — C61 Malignant neoplasm of prostate: Secondary | ICD-10-CM | POA: Diagnosis not present

## 2018-05-20 LAB — CMP (CANCER CENTER ONLY)
ALT: 49 U/L — ABNORMAL HIGH (ref 0–44)
AST: 60 U/L — ABNORMAL HIGH (ref 15–41)
Albumin: 4.4 g/dL (ref 3.5–5.0)
Alkaline Phosphatase: 78 U/L (ref 38–126)
Anion gap: 9 (ref 5–15)
BUN: 9 mg/dL (ref 6–20)
CO2: 27 mmol/L (ref 22–32)
Calcium: 9.7 mg/dL (ref 8.9–10.3)
Chloride: 103 mmol/L (ref 98–111)
Creatinine: 0.9 mg/dL (ref 0.61–1.24)
GFR, Est AFR Am: >60 mL/min (ref >60)
GFR, Estimated: >60 mL/min (ref >60)
Glucose, Bld: 120 mg/dL — ABNORMAL HIGH (ref 70–99)
Potassium: 4.5 mmol/L (ref 3.5–5.1)
Sodium: 139 mmol/L (ref 135–145)
Total Bilirubin: 0.4 mg/dL (ref 0.3–1.2)
Total Protein: 6.9 g/dL (ref 6.5–8.1)

## 2018-05-20 LAB — CBC WITH DIFFERENTIAL (CANCER CENTER ONLY)
Abs Immature Granulocytes: 0.06 10*3/uL (ref 0.00–0.07)
Basophils Absolute: 0.1 10*3/uL (ref 0.0–0.1)
Basophils Relative: 1 %
Eosinophils Absolute: 0.6 10*3/uL — ABNORMAL HIGH (ref 0.0–0.5)
Eosinophils Relative: 4 %
HCT: 42 % (ref 39.0–52.0)
Hemoglobin: 13.7 g/dL (ref 13.0–17.0)
Immature Granulocytes: 0 %
Lymphocytes Relative: 24 %
Lymphs Abs: 3.4 10*3/uL (ref 0.7–4.0)
MCH: 29.8 pg (ref 26.0–34.0)
MCHC: 32.6 g/dL (ref 30.0–36.0)
MCV: 91.5 fL (ref 80.0–100.0)
Monocytes Absolute: 1.2 10*3/uL — ABNORMAL HIGH (ref 0.1–1.0)
Monocytes Relative: 8 %
Neutro Abs: 8.7 10*3/uL — ABNORMAL HIGH (ref 1.7–7.7)
Neutrophils Relative %: 63 %
Platelet Count: 192 10*3/uL (ref 150–400)
RBC: 4.59 MIL/uL (ref 4.22–5.81)
RDW: 14.8 % (ref 11.5–15.5)
WBC Count: 14 10*3/uL — ABNORMAL HIGH (ref 4.0–10.5)
nRBC: 0 % (ref 0.0–0.2)

## 2018-05-20 NOTE — Progress Notes (Signed)
Hematology and Oncology Follow Up Visit  Ryan Davis [id number] 25-May-1960 58 y.o. 05/20/2018   Principle Diagnosis:  Metastatic prostate cancer - androgen sensitive  Past Therapy: 14 fractions of radiation to the lumbar spine Casodex 50 mg by mouth daily- stopped 04/02/2017 due to progression Zytiga 1000 mg by mouth daily/prednisone 10 mg by mouth daily - started on 04/20/2016- d/c on 04/02/2017 Xtandi160 mg po q day - startedon 04/06/2017-- d/c on 06/24/2017 Flutamide 250 mg po q8hr - started06/12/2017 -- d/c on 04/23/18  Current Therapy:   Lupron 11.5 mg IM every 3 month- next dose June 2020  Zometa 4 mg IV q3 months- next dose June 2020 Apalutamide 240 mg po q day -- started on 04/28/2018   Interim History:  Ryan Davis is here today for follow-up. He started his Erleada the first week of this month and is tolerating it well so far. He does have some pressure in his pelvis and burning on urination. He feels he has a UTI. He had some rare bacteria on UA but culture was felt to be contaminated so we will recollect and send today.  No fever, chills, n/v, cough, rash, dizziness, SOB, chest pain, palpitations, abdominal pain or changes in bowel or bladder habits.  He has numbness and tingling in the hands and feet that he describes as "falling asleep". This comes and goes.  He ambulates with a cane for support. No falls or syncopal episodes to report.  No lymphadenopathy noted on exam.  No swelling or tenderness in his extremities at this time.  He states that he is eating well and staying hydrated.His weight is stable.   ECOG Performance Status: 1 - Symptomatic but completely ambulatory  Medications:  Allergies as of 05/20/2018      Reactions   Contrast Media [iodinated Diagnostic Agents] Nausea And Vomiting   Patient vomits immediately after IV contrast is injected just prior to start of scan, was unable to scan patient in correct time frame.  Patient did not warn me of  this when dosing the patient with oral CM and asking the patient history questions until the patient was on the table for the CT scan just prior to injection.  Ct Tech:  Karl Bales 07/29/17    Other Nausea And Vomiting   Patient vomits immediately after IV contrast is injected just prior to start of scan, was unable to scan patient in correct time frame.  Patient did not warn me of this when dosing the patient with oral CM and asking the patient history questions until the patient was on the table for the CT scan just prior to injection.  Ct Tech:  Karl Bales 07/29/17       Medication List       Accurate as of May 20, 2018 11:36 AM. Always use your most recent med list.        apalutamide 60 MG tablet Commonly known as:  Erleada Take 4 tablets (240 mg total) by mouth daily. May be taken with or without food. Swallow tablets whole.   CALCIUM-VITAMIN D3 PO Take 1 tablet by mouth daily.   diltiazem 240 MG 24 hr capsule Commonly known as:  CARDIZEM CD TAKE 1 CAPSULE BY MOUTH EVERY DAY   dronabinol 5 MG capsule Commonly known as:  MARINOL Take 1 capsule (5 mg total) by mouth 2 (two) times daily before a meal.   fentaNYL 100 MCG/HR Commonly known as:  [place] 2 patches onto the skin every 3 (three)  days.   megestrol 625 MG/5ML suspension Commonly known as:  MEGACE ES Take 5 mLs (625 mg total) by mouth 2 (two) times daily.   metoprolol tartrate 25 MG tablet Commonly known as:  LOPRESSOR TAKE 1/2 TABLET(12.5 MG) BY MOUTH TWICE DAILY   Narcan 4 MG/0.1ML Liqd nasal spray kit Generic drug:  naloxone NAR REP ALN   ondansetron 4 MG disintegrating tablet Commonly known as:  Zofran ODT Take 1 tablet (4 mg total) by mouth every 8 (eight) hours as needed for nausea or vomiting.   ondansetron 4 MG tablet Commonly known as:  ZOFRAN TAKE 1 TABLET (4 MG TOTAL) BY MOUTH EVERY 6 (SIX) HOURS AS NEEDED FOR NAUSEA OR VOMITING.   oxyCODONE-acetaminophen 10-325 MG  tablet Commonly known as:  Percocet Take 1 tablet by mouth every 6 (six) hours as needed for pain.   tamsulosin 0.4 MG Caps capsule Commonly known as:  FLOMAX TAKE 1 CAPSULE BY MOUTH EVERY DAY       Allergies:  Allergies  Allergen Reactions   Contrast Media [Iodinated Diagnostic Agents] Nausea And Vomiting    Patient vomits immediately after IV contrast is injected just prior to start of scan, was unable to scan patient in correct time frame.  Patient did not warn me of this when dosing the patient with oral CM and asking the patient history questions until the patient was on the table for the CT scan just prior to injection.  Ct Tech:  Karl Bales 07/29/17    Other Nausea And Vomiting    Patient vomits immediately after IV contrast is injected just prior to start of scan, was unable to scan patient in correct time frame.  Patient did not warn me of this when dosing the patient with oral CM and asking the patient history questions until the patient was on the table for the CT scan just prior to injection.  Ct Tech:  Karl Bales 07/29/17     Past Medical History, Surgical history, Social history, and Family History were reviewed and updated.  Review of Systems: All other 10 point review of systems is negative.   Physical Exam:  vitals were not taken for this visit.   Wt Readings from Last 3 Encounters:  04/23/18 174 lb 8 oz (79.2 kg)  03/24/18 172 lb (78 kg)  01/19/18 177 lb (80.3 kg)    Ocular: Sclerae unicteric, pupils equal, round and reactive to light Ear-nose-throat: Oropharynx clear, dentition fair Lymphatic: No cervical or supraclavicular adenopathy Lungs no rales or rhonchi, good excursion bilaterally Heart regular rate and rhythm, no murmur appreciated Abd soft, nontender, positive bowel sounds, no liver or spleen tip palpated on exam, no fluid wave  MSK no focal spinal tenderness, no joint edema Neuro: non-focal, well-oriented, appropriate affect Breasts:  Deferred   Lab Results  Component Value Date   WBC 14.0 (H) 05/20/2018   HGB 13.7 05/20/2018   HCT 42.0 05/20/2018   MCV 91.5 05/20/2018   PLT 192 05/20/2018   Lab Results  Component Value Date   FERRITIN 216 11/04/2017   IRON 77 11/04/2017   TIBC 261 11/04/2017   UIBC 184 11/04/2017   IRONPCTSAT 30 (L) 11/04/2017   Lab Results  Component Value Date   RBC 4.59 05/20/2018   Lab Results  Component Value Date   KAPLAMBRATIO 0.89 03/28/2016   Lab Results  Component Value Date   IGGSERUM 1,066 03/28/2016   IGMSERUM 106 03/28/2016   No results found for: TOTALPROTELP, ALBUMINELP, A1GS, A2GS, BETS,  Truddie Crumble, SPEI   Chemistry      Component Value Date/Time   NA 139 04/23/2018 1030   NA 143 12/16/2016 1022   K 3.8 04/23/2018 1030   K 3.5 12/16/2016 1022   CL 107 04/23/2018 1030   CL 101 12/16/2016 1022   CO2 22 04/23/2018 1030   CO2 33 12/16/2016 1022   BUN 7 04/23/2018 1030   BUN 4 (L) 12/16/2016 1022   CREATININE 1.09 04/23/2018 1030   CREATININE 0.8 12/16/2016 1022      Component Value Date/Time   CALCIUM 9.2 04/23/2018 1030   CALCIUM 9.4 12/16/2016 1022   ALKPHOS 85 04/23/2018 1030   ALKPHOS 77 12/16/2016 1022   AST 41 04/23/2018 1030   ALT 55 (H) 04/23/2018 1030   ALT 16 12/16/2016 1022   BILITOT 0.4 04/23/2018 1030       Impression and Plan: Ryan Davis is a very pleasant 58 yo African American gentleman with metastatic prostate cancer.  He is tolerating Erleada nicely so far and will continue his same regimen.  PSA drawn today, result pending.  We also recollected his urine culture today and will see what this grows.  We will plan to see him back in another month for MD follow-up.  He will contact our office with any questions or concerns. We can certainly see him sooner if need be.   Laverna Peace, NP 4/23/202011:36 AM

## 2018-05-21 LAB — PSA, TOTAL AND FREE
PSA, Free Pct: 5.8 %
PSA, Free: 0.62 ng/mL
Prostate Specific Ag, Serum: 10.7 ng/mL — ABNORMAL HIGH (ref 0.0–4.0)

## 2018-05-21 LAB — URINE CULTURE: Culture: <10000 — AB

## 2018-05-21 MED FILL — ERLEADA 60 MG TABS: 60 | 30 days supply | Qty: 120 | Fill #1

## 2018-05-26 ENCOUNTER — Other Ambulatory Visit: Payer: Self-pay

## 2018-05-26 ENCOUNTER — Encounter (HOSPITAL_BASED_OUTPATIENT_CLINIC_OR_DEPARTMENT_OTHER): Payer: Self-pay

## 2018-05-26 ENCOUNTER — Emergency Department (HOSPITAL_BASED_OUTPATIENT_CLINIC_OR_DEPARTMENT_OTHER): Payer: Medicaid Other

## 2018-05-26 ENCOUNTER — Emergency Department (HOSPITAL_BASED_OUTPATIENT_CLINIC_OR_DEPARTMENT_OTHER)
Admission: EM | Admit: 2018-05-26 | Discharge: 2018-05-26 | Disposition: A | Discharge status: Home / Self Care | Payer: Medicaid Other | Attending: Emergency Medicine | Admitting: Emergency Medicine

## 2018-05-26 ENCOUNTER — Telehealth: Payer: Self-pay

## 2018-05-26 DIAGNOSIS — Z79899 Other long term (current) drug therapy: Secondary | ICD-10-CM | POA: Insufficient documentation

## 2018-05-26 DIAGNOSIS — C7951 Secondary malignant neoplasm of bone: Secondary | ICD-10-CM | POA: Diagnosis not present

## 2018-05-26 DIAGNOSIS — F17210 Nicotine dependence, cigarettes, uncomplicated: Secondary | ICD-10-CM | POA: Insufficient documentation

## 2018-05-26 DIAGNOSIS — C61 Malignant neoplasm of prostate: Secondary | ICD-10-CM | POA: Insufficient documentation

## 2018-05-26 DIAGNOSIS — M549 Dorsalgia, unspecified: Secondary | ICD-10-CM | POA: Diagnosis not present

## 2018-05-26 LAB — BASIC METABOLIC PANEL
Anion gap: 13 (ref 5–15)
BUN: 7 mg/dL (ref 6–20)
CO2: 19 mmol/L — ABNORMAL LOW (ref 22–32)
Calcium: 8.9 mg/dL (ref 8.9–10.3)
Chloride: 106 mmol/L (ref 98–111)
Creatinine, Ser: 0.81 mg/dL (ref 0.61–1.24)
GFR calc Af Amer: >60 mL/min (ref >60)
GFR calc non Af Amer: >60 mL/min (ref >60)
Glucose, Bld: 93 mg/dL (ref 70–99)
Potassium: 3.8 mmol/L (ref 3.5–5.1)
Sodium: 138 mmol/L (ref 135–145)

## 2018-05-26 LAB — URINALYSIS, ROUTINE W REFLEX MICROSCOPIC
Bilirubin Urine: NEGATIVE
Glucose, UA: NEGATIVE mg/dL
Hgb urine dipstick: NEGATIVE
Ketones, ur: NEGATIVE mg/dL
Nitrite: NEGATIVE
Protein, ur: NEGATIVE mg/dL
Specific Gravity, Urine: 1.025 (ref 1.005–1.030)
pH: 6.5 (ref 5.0–8.0)

## 2018-05-26 LAB — CBC WITH DIFFERENTIAL/PLATELET
Abs Immature Granulocytes: 0.06 10*3/uL (ref 0.00–0.07)
Basophils Absolute: 0.1 10*3/uL (ref 0.0–0.1)
Basophils Relative: 1 %
Eosinophils Absolute: 0.3 10*3/uL (ref 0.0–0.5)
Eosinophils Relative: 3 %
HCT: 43 % (ref 39.0–52.0)
Hemoglobin: 14 g/dL (ref 13.0–17.0)
Immature Granulocytes: 1 %
Lymphocytes Relative: 27 %
Lymphs Abs: 2.7 10*3/uL (ref 0.7–4.0)
MCH: 29.5 pg (ref 26.0–34.0)
MCHC: 32.6 g/dL (ref 30.0–36.0)
MCV: 90.5 fL (ref 80.0–100.0)
Monocytes Absolute: 1 10*3/uL (ref 0.1–1.0)
Monocytes Relative: 10 %
Neutro Abs: 5.9 10*3/uL (ref 1.7–7.7)
Neutrophils Relative %: 58 %
Platelets: 201 10*3/uL (ref 150–400)
RBC: 4.75 MIL/uL (ref 4.22–5.81)
RDW: 14.9 % (ref 11.5–15.5)
WBC: 10.1 10*3/uL (ref 4.0–10.5)
nRBC: 0 % (ref 0.0–0.2)

## 2018-05-26 LAB — URINALYSIS, MICROSCOPIC (REFLEX)

## 2018-05-26 MED ORDER — SODIUM CHLORIDE 0.9 % IV BOLUS
1000.0000 mL | Freq: Once | INTRAVENOUS | Status: AC
  Administered 2018-05-26: 1000 mL via INTRAVENOUS

## 2018-05-26 MED ORDER — CEPHALEXIN 500 MG PO CAPS: 500.0000 mg | ORAL_CAPSULE | Freq: Two times a day (BID) | ORAL | 0 refills | Status: AC

## 2018-05-26 MED ORDER — KETOROLAC TROMETHAMINE 30 MG/ML IJ SOLN
30.0000 mg | Freq: Once | INTRAMUSCULAR | Status: AC
  Administered 2018-05-26: 19:00:00 30 mg via INTRAVENOUS
  Filled 2018-05-26: qty 1

## 2018-05-26 NOTE — ED Notes (Signed)
ED Provider at bedside. 

## 2018-05-26 NOTE — ED Provider Notes (Signed)
[facility] EMERGENCY DEPARTMENT Provider Note   CSN: [id number] Arrival date & time: 05/26/18  1543    History   Chief Complaint Chief Complaint  Patient presents with  . Back Pain    HPI Ryan Davis is a 58 y.o. male with a medical history of metastatic prostate cancer to the bone, A. fib, chronic low back pain presenting to emergency department today with chief complaint of back pain and leg pain x1 week.  Patient states the pain is located over his spine starting in the middle of his back and radiates down to his legs and feet.  The pain is worse when he takes a deep breath. He describes the pain as sharp and states it is intermittent.  He rates the pain 8 out of 10 in severity.  He has been taking oxycodone and Aleve without symptom relief.  The pain is worse with movement.  He admits to weakness and burning sensation in bilateral legs and feet.  He was told to take gabapentin to see if this would help.  He has taken gabapentin for over 1 week without symptom relief.  Patient also reports he has had intermittent dysuria for the last week as well.  He has been seen by his oncologist for this and has given 2 urine samples as the first 1 was contaminated.  He states he call today to get results of the urinalysis and was told that the urine was clear.  Denies any associated fever, chills, weight loss, chest pain, shortness of breath, nausea, vomiting, abdominal pain, hematuria, penile discharge, penile pain, testicular pain, scrotal swelling.  Also denies any urinary retention, bowel or bladder incontinence, saddle anesthesia, IVDU, recent fall or trauma  Patient's last chemo treatment was 2 weeks ago.  His oncologist is Dr. Marin Olp. His current therapy includes Lupron 11.5 mg IM with next doseJune2020,  Zometa 4 mg IV q3 monthswith next doseJune2020 Apalutamide 240 mg po q day started on 04/28/2018.    History provided by patient with additional history obtained from  chart review.    Past Medical History:  Diagnosis Date  . A-fib [facility])    see 03-28-16 admission  . Back pain without radiculopathy 03/28/2016  . Dyspnea    endorses since treatment radiation , still gets SOB occasionally   . Dysrhythmia    afib   . Goals of care, counseling/discussion 06/24/2017  . History of radiation therapy 09/16/16-10/06/16   35 Gy in 14 fractions to the lumbar spine  . Nocturia   . Prostate cancer metastatic to bone ([place]) 03/31/2016  . Prostate cancer metastatic to multiple sites [facility]) 03/31/2016    Patient Active Problem List   Diagnosis Date Noted  . Goals of care, counseling/discussion 06/24/2017  . 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[place]) 03/31/2016  . Prostate cancer metastatic to bone ([place]) 03/31/2016  . Atrial fibrillation ([place]) 03/30/2016  . Back pain without radiculopathy 03/28/2016  . Atrial fibrillation with RVR ([place]) 03/28/2016    Past Surgical History:  Procedure Laterality Date  . MULTIPLE EXTRACTIONS WITH ALVEOLOPLASTY N/A 11/19/2016   Procedure: EXTRACTION OF TOOTH #'S 1,3,5-7,9-17, AND 20- 29 WITH ALVEOLOPLASTY, BILATERAL MANDIBULAR TORI REDUCTIONS AND BILATERAL MAXILLARY BUCCAL EXOSTOSES REDUCTIONS;  Surgeon: Lenn Cal, DDS;  Location: [facility] ORS;  Service: Oral Surgery;  Laterality: N/A;  . NO PAST SURGERIES          Home Medications    Prior to Admission medications   Medication Sig Start Date End Date  Taking? Authorizing Provider  apalutamide (ERLEADA) 60 MG tablet Take 4 tablets (240 mg total) by mouth daily. May be taken with or without food. Swallow tablets whole. 04/23/18   [name], MD  Calcium Carbonate-Vitamin D (CALCIUM-VITAMIN D3 PO) Take 1 tablet by mouth daily.    [provider]  cephALEXin (KEFLEX) 500 MG capsule Take 1 capsule (500 mg total) by mouth 2 (two) times daily for 7 days. 05/26/18 06/02/18  Sohana Austell E, PA-C  diltiazem (CARDIZEM CD) 240 MG 24 hr capsule TAKE 1 CAPSULE BY MOUTH  EVERY DAY 05/03/18   [name], MD  dronabinol (MARINOL) 5 MG capsule Take 1 capsule (5 mg total) by mouth 2 (two) times daily before a meal. 10/08/17   [place], Holli Humbles, NP  fentaNYL (DURAGESIC) 100 MCG/HR Place 2 patches onto the skin every 3 (three) days. 05/14/18   [name], MD  megestrol (MEGACE ES) 625 MG/5ML suspension Take 5 mLs (625 mg total) by mouth 2 (two) times daily. 03/24/18   [name], MD  metoprolol tartrate (LOPRESSOR) 25 MG tablet TAKE 1/2 TABLET(12.5 MG) BY MOUTH TWICE DAILY 11/05/17   [name], MD  NARCAN 4 MG/0.1ML LIQD nasal spray kit NAR REP ALN 02/19/18   [provider]  ondansetron (ZOFRAN) 4 MG tablet TAKE 1 TABLET (4 MG TOTAL) BY MOUTH EVERY 6 (SIX) HOURS AS NEEDED FOR NAUSEA OR VOMITING. 02/19/18   [place], Holli Humbles, NP  ondansetron (ZOFRAN-ODT) 4 MG disintegrating tablet Take 1 tablet (4 mg total) by mouth every 8 (eight) hours as needed for nausea or vomiting. 01/09/18   Robinson, [place] N, PA-C  oxyCODONE-acetaminophen (PERCOCET) 10-325 MG tablet Take 1 tablet by mouth every 6 (six) hours as needed for pain. 05/14/18   [name], MD  tamsulosin (FLOMAX) 0.4 MG CAPS capsule TAKE 1 CAPSULE BY MOUTH EVERY DAY 03/24/18   [place], Holli Humbles, NP    Family History Family History  Problem Relation Age of Onset  . Stroke Sister   . Lupus Mother   . Cancer Neg Hx   . Heart disease Neg Hx   . Diabetes Neg Hx     Social History Social History   Tobacco Use  . Smoking status: Current Some Day Smoker    Packs/day: 0.25    Years: 39.00    Pack years: 9.75    Types: Cigarettes    Last attempt to quit: 10/13/2016    Years since quitting: 1.6  . Smokeless tobacco: Never Used  . Tobacco comment: 04/18/2016 still smokes; 11-17-16 states some day smoker   Substance Use Topics  . Alcohol use: Not Currently  . Drug use: No     Allergies   Contrast media [iodinated diagnostic agents] and Other   Review of Systems Review  of Systems  Constitutional: Negative for activity change, appetite change, chills, fever and unexpected weight change.  HENT: Negative for congestion, rhinorrhea, sinus pressure and sore throat.   Eyes: Negative for pain and redness.  Respiratory: Negative for cough, shortness of breath and wheezing.   Cardiovascular: Negative for chest pain and palpitations.  Gastrointestinal: Negative for abdominal pain, constipation, diarrhea, nausea and vomiting.  Genitourinary: Positive for dysuria. Negative for discharge, penile pain, penile swelling, scrotal swelling and testicular pain.  Musculoskeletal: Positive for arthralgias and back pain. Negative for myalgias and neck pain.  Skin: Negative for rash and wound.  Allergic/Immunologic: Positive for immunocompromised state.  Neurological: Negative for dizziness, syncope, weakness, numbness  and headaches.  Psychiatric/Behavioral: Negative for confusion.     Physical Exam Updated Vital Signs BP (!) 143/98 (BP Location: Right Arm)   Pulse (!) 104   Temp 98.2 F (36.8 C) (Oral)   Resp 20   Ht 6' 2"  (1.88 m)   Wt 73.9 kg   SpO2 100%   BMI 20.93 kg/m   Physical Exam Vitals signs and nursing note reviewed.  Constitutional:      Appearance: He is well-developed. He is not toxic-appearing.  HENT:     Head: Normocephalic and atraumatic.  Eyes:     General: No scleral icterus.       Right eye: No discharge.        Left eye: No discharge.     Conjunctiva/sclera: Conjunctivae normal.  Neck:     Musculoskeletal: Normal range of motion. No muscular tenderness.  Cardiovascular:     Rate and Rhythm: Regular rhythm.     Pulses:          Radial pulses are 2+ on the right side and 2+ on the left side.       Dorsalis pedis pulses are 2+ on the right side and 2+ on the left side.     Comments: Tachycardic to 104  Pulmonary:     Effort: Pulmonary effort is normal.     Breath sounds: Normal breath sounds.     Comments: Equal and symmetric chest  rise. Lungs clear to auscultation in all fields. Abdominal:     General: There is no distension.     Tenderness: There is no abdominal tenderness. There is no right CVA tenderness, left CVA tenderness or guarding.  Musculoskeletal: Normal range of motion.     Right lower leg: No edema.     Left lower leg: No edema.     Comments: Full range of motion of thoracic spine and lumbar spine with flexion, hyperextension, and lateral flexion. Tenderness to palpation of spinous processes of thoracic spine and lumbar spine.  Tenderness to palpation of paraspinous muscles of bilateral lumbar spine.  Positive straight leg raise test bilaterally.  No overlying erythema, ecchymosis or edema. Negative Homans sign bilaterally. Bilateral calves non tender to palpation.  Skin:    General: Skin is warm and dry.     Capillary Refill: Capillary refill takes less than 2 seconds.     Findings: No rash.  Neurological:     Mental Status: He is alert and oriented to person, place, and time.     Comments: Sensation grossly intact to light touch in the lower extremities bilaterally. No saddle anesthesias. Strength 5/5 with flexion and extension at the bilateral hips, knees, and ankles. No noted gait deficit. Coordination intact with heel to shin testing.   Psychiatric:        Behavior: Behavior normal.      ED Treatments / Results  Labs (all labs ordered are listed, but only abnormal results are displayed) Labs Reviewed  BASIC METABOLIC PANEL - Abnormal; Notable for the following components:      Result Value   CO2 19 (*)    All other components within normal limits  URINALYSIS, ROUTINE W REFLEX MICROSCOPIC - Abnormal; Notable for the following components:   Leukocytes,Ua TRACE (*)    All other components within normal limits  URINALYSIS, MICROSCOPIC (REFLEX) - Abnormal; Notable for the following components:   Bacteria, UA FEW (*)    All other components within normal limits  URINE CULTURE  CBC WITH  DIFFERENTIAL/PLATELET  EKG None  Radiology Dg Thoracic Spine 2 View  Result Date: 05/26/2018 CLINICAL DATA:  Thoracic back pain for 1 week. Prostate carcinoma. EXAM: THORACIC SPINE 2 VIEWS COMPARISON:  None. FINDINGS: There is no evidence of thoracic spine fracture. Alignment is normal. Multiple sclerotic bone lesions are seen throughout the thoracic spine, consistent with bone metastases. Degenerative changes noted in the lower cervical spine. IMPRESSION: 1. No acute findings. 2. Diffuse sclerotic bone metastases. Electronically Signed   By: Earle Gell M.D.   On: 05/26/2018 19:25   Dg Lumbar Spine Complete  Result Date: 05/26/2018 CLINICAL DATA:  Low back pain for 1 week. Prostate carcinoma. EXAM: LUMBAR SPINE - COMPLETE 4+ VIEW COMPARISON:  None. FINDINGS: There is no evidence of lumbar spine fracture. Alignment is normal. Intervertebral disc spaces are maintained. Diffuse sclerotic bone lesions seen throughout the lumbar spine, consistent with metastatic disease. IMPRESSION: 1. No acute findings. 2. Diffuse sclerotic bone metastases. Electronically Signed   By: Earle Gell M.D.   On: 05/26/2018 19:28    Procedures Procedures (including critical care time)  Medications Ordered in ED Medications  sodium chloride 0.9 % bolus 1,000 mL (0 mLs Intravenous Stopped 05/26/18 2021)  ketorolac (TORADOL) 30 MG/ML injection 30 mg (30 mg Intravenous Given 05/26/18 1901)     Initial Impression / Assessment and Plan / ED Course  I have reviewed the triage vital signs and the nursing notes.  Pertinent labs & imaging results that were available during my care of the patient were reviewed by me and considered in my medical decision making (see chart for details).  58 year old male with metastatic prostate cancer presents today with 1 week of back pain and dysuria.  He is afebrile, overall well-appearing.  He is tachycardic to 104 in triage. His is not tachypnic and not hypoxic. Work up initiated with  CBC, BMP, UA. DDX includes lumbar fracture, bony mets, less likely PE. Looking through pt's medical records he has history of mets to lumbar spine seen on previous imaging.   Patient is not short of breath and does not have any chest pain. He is not hypoxic or tachypnic. PE in the differential however description of his pain and my exam is suggestive of musculoskeletal back pain, especially with his history of mets to lumbar spine. He was tachycardic on arrival, but on multiple reassessments pt's heart rate has ranges from 70-85. Discussed with my attending Dr. Billy Fischer and at this time we do not feel CTA chest is necessary. CBC and BMP are unremarkable, without metabolic derangements, renal insufficiencies. UA I not overly suggestive of UTI, there are few bacteria and trace leukocytes, urine sent for culture. Given pt has urinary symptoms will give prescription for antibiotic. Xrays of lumbar and thoracic spine viewed by me show bony mets, no fractures of acute injury.   Patient is hemodynamically stable, in NAD, and able to ambulate in the ED. Evaluation does not show pathology that would require ongoing emergent intervention or inpatient treatment. I explained the diagnosis to the patient.  Patient is comfortable with above plan and is stable for discharge at this time. All questions were answered prior to disposition. Strict return precautions for returning to the ED were discussed. Encouraged follow up with oncologist in next 2-5 days.Pt case discussed with Dr. Billy Fischer who agrees with my plan.    This note was prepared with assistance of Systems analyst. Occasional wrong-word or sound-a-like substitutions may have occurred due to the inherent limitations of voice recognition software.  This note was prepared with assistance of Systems analyst. Occasional wrong-word or sound-a-like substitutions may have occurred due to the inherent limitations of voice  recognition software.   Final Clinical Impressions(s) / ED Diagnoses   Final diagnoses:  Acute back pain, unspecified back location, unspecified back pain laterality  Prostate cancer metastatic to bone [facility])    ED Discharge Orders         Ordered    cephALEXin (KEFLEX) 500 MG capsule  2 times daily     05/26/18 2019           Cherre Robins, PA-C 05/27/18 1246    Gareth Morgan, MD 05/28/18 [phone]

## 2018-05-26 NOTE — Discharge Instructions (Addendum)
You have been seen today for back pain. Please read and follow all provided instructions. Return to the emergency room for worsening condition or new concerning symptoms.    1. Medications:  Prescription sent to your pharmacy for Keflex.  This is an antibiotic used to treat urinary tract infections.  Please take as prescribed.  Continue usual home medications Take medications as prescribed. Please review all of the medicines and only take them if you do not have an allergy to them.  2. Treatment: rest, drink plenty of fluids 3. Follow Up: Please follow up with your oncologist in 2-5 days for discussion of your diagnoses and further evaluation after today's visit; Call today to arrange your follow up.  It is possible that you need an outpatient MRI.  Your oncologist would be going to schedule this for you.  It is very important that you follow-up.  It is also a possibility that you have an allergic reaction to any of the medicines that you have been prescribed - Everybody reacts differently to medications and while MOST people have no trouble with most medicines, you may have a reaction such as nausea, vomiting, rash, swelling, shortness of breath. If this is the case, please stop taking the medicine immediately and contact your physician.  ?

## 2018-05-26 NOTE — ED Triage Notes (Signed)
C/o lower back, flank, bilat leg pain x 1 week-pt states he is currently in tx for prostate cancer-last chemo tx was last 2 weeks ago-NAD-to triage in w/c-drove self to ED

## 2018-05-26 NOTE — ED Notes (Signed)
Patient transported to X-ray 

## 2018-05-26 NOTE — Telephone Encounter (Signed)
Received VM from pt requesting results from urine last week.  Per Judson Roch, no obvious infection. Needs to see urology. If pt doesn't have urologist, referral to be made by Judson Roch, NP.   Upon return call, pt states that he continues to have dysuria, but also reports numbness down both legs and "can not feel my feet." Pt states this had gotten progressively worse over the past 2 weeks. Reports stumbling on ambulation and inability to drive d/t these new sx. Also reports shooting pain from low back radiating down both legs. Denies loss of bowel or bladder function.   Discussed with Dr Marin Olp who recommends pt report to ED. Spoke with pt who states he is unable to go as he has no one to stay with his wife. Expressed the importance of urgent evaluation and his risk for cord compression. Pt verbalizes understanding and states he will go as soon as possible. dph

## 2018-05-27 ENCOUNTER — Telehealth: Payer: Self-pay | Admitting: Hematology & Oncology

## 2018-05-27 ENCOUNTER — Telehealth: Payer: Self-pay | Admitting: *Deleted

## 2018-05-27 NOTE — Telephone Encounter (Signed)
Appointments scheduled letter/calendar mailed  °

## 2018-05-27 NOTE — Telephone Encounter (Signed)
Message received from patient stating that he went to the ED yesterday and that he was discharged home with no additional pain medicine and would like a call back regarding additional pain medicine.  Dr. Marin Olp notified and order received for patient to come in tomorrow for appt with [place] NP.  Message sent to scheduling.  Call placed back to patient and patient notified of MD orders.

## 2018-05-28 ENCOUNTER — Inpatient Hospital Stay: Payer: Medicaid Other | Attending: Hematology & Oncology | Admitting: Family

## 2018-05-28 ENCOUNTER — Other Ambulatory Visit: Payer: Self-pay

## 2018-05-28 VITALS — BP 121/74 | HR 86

## 2018-05-28 DIAGNOSIS — G629 Polyneuropathy, unspecified: Secondary | ICD-10-CM | POA: Diagnosis not present

## 2018-05-28 DIAGNOSIS — C7951 Secondary malignant neoplasm of bone: Secondary | ICD-10-CM | POA: Diagnosis not present

## 2018-05-28 DIAGNOSIS — C61 Malignant neoplasm of prostate: Secondary | ICD-10-CM

## 2018-05-28 DIAGNOSIS — N390 Urinary tract infection, site not specified: Secondary | ICD-10-CM | POA: Insufficient documentation

## 2018-05-28 DIAGNOSIS — R300 Dysuria: Secondary | ICD-10-CM

## 2018-05-28 LAB — URINE CULTURE

## 2018-05-28 MED ORDER — GABAPENTIN 300 MG PO CAPS: 300.0000 mg | ORAL_CAPSULE | Freq: Three times a day (TID) | ORAL | 2 refills | Status: DC

## 2018-05-28 NOTE — Progress Notes (Signed)
Hematology and Oncology Follow Up Visit  [name] [id number] 1961/01/04 58 y.o. 05/28/2018   Principle Diagnosis:  Metastatic prostate cancer - androgen sensitive  Past Therapy: 14 fractions of radiation to the lumbar spine Casodex 50 mg by mouth daily- stopped 04/02/2017 due to progression Zytiga 1000 mg by mouth daily/prednisone 10 mg by mouth daily - started on 04/20/2016- d/c on 04/02/2017 Xtandi160 mg po q day - startedon 04/06/2017-- d/c on 06/24/2017 Flutamide 250 mg po q8hr - started06/12/2017-- d/c on 04/23/18  Current Therapy:   Lupron 11.5 mg IM every 3 month- next doseJune2020  Zometa 4 mg IV q3 months- next [id number] Apalutamide 240 mg po q day -- started on 04/28/2018   Interim History:  [name] is here today for follow-up for leg pain and numbness as well as intermittent lower back pain. He was seen in the ED yesterday. X-rays of the lumbar and thoracic spine showed known diffuse sclerotic bone metastasis.  He states that he has burning in his feet as well as numbness in both legs off and on. He denies urinary or bowel incontinence.  He is ambulating with a cane for added support. He has his usual fatigue and weakness. No changes.  No falls or syncopal episodes to report.   He is having issues with recurrent UTI. He is now on a 7 day course of Keflex.  No fever, chills, n/v, cough, rash, dizziness, SOB, chest pain, palpitations, abdominal pain or changes in bowel habits.  No swelling in his extremities.  No lymphadenopathy noted on exam.  He states that he is eating well and staying hydrated.   ECOG Performance Status: 2 - Symptomatic, <50% confined to bed  Medications:  Allergies as of 05/28/2018      Reactions   Contrast Media [iodinated Diagnostic Agents] Nausea And Vomiting   Patient vomits immediately after IV contrast is injected just prior to start of scan, was unable to scan patient in correct time frame.  Patient did Ryan warn me of  this when dosing the patient with oral CM and asking the patient history questions until the patient was on the table for the CT scan just prior to injection.  Ct Tech:  Karl Bales 07/29/17    Other Nausea And Vomiting   Patient vomits immediately after IV contrast is injected just prior to start of scan, was unable to scan patient in correct time frame.  Patient did Ryan warn me of this when dosing the patient with oral CM and asking the patient history questions until the patient was on the table for the CT scan just prior to injection.  Ct Tech:  Karl Bales 07/29/17       Medication List       Accurate as of May 28, 2018 11:42 AM. Always use your most recent med list.        apalutamide 60 MG tablet Commonly known as:  Erleada Take 4 tablets (240 mg total) by mouth daily. May be taken with or without food. Swallow tablets whole.   CALCIUM-VITAMIN D3 PO Take 1 tablet by mouth daily.   cephALEXin 500 MG capsule Commonly known as:  KEFLEX Take 1 capsule (500 mg total) by mouth 2 (two) times daily for 7 days.   diltiazem 240 MG 24 hr capsule Commonly known as:  CARDIZEM CD TAKE 1 CAPSULE BY MOUTH EVERY DAY   dronabinol 5 MG capsule Commonly known as:  MARINOL Take 1 capsule (5 mg total) by mouth 2 (two)  times daily before a meal.   fentaNYL 100 MCG/HR Commonly known as:  [place] 2 patches onto the skin every 3 (three) days.   megestrol 625 MG/5ML suspension Commonly known as:  MEGACE ES Take 5 mLs (625 mg total) by mouth 2 (two) times daily.   metoprolol tartrate 25 MG tablet Commonly known as:  LOPRESSOR TAKE 1/2 TABLET(12.5 MG) BY MOUTH TWICE DAILY   Narcan 4 MG/0.1ML Liqd nasal spray kit Generic drug:  naloxone NAR REP ALN   ondansetron 4 MG disintegrating tablet Commonly known as:  Zofran ODT Take 1 tablet (4 mg total) by mouth every 8 (eight) hours as needed for nausea or vomiting.   ondansetron 4 MG tablet Commonly known as:  ZOFRAN TAKE 1 TABLET (4  MG TOTAL) BY MOUTH EVERY 6 (SIX) HOURS AS NEEDED FOR NAUSEA OR VOMITING.   oxyCODONE-acetaminophen 10-325 MG tablet Commonly known as:  Percocet Take 1 tablet by mouth every 6 (six) hours as needed for pain.   tamsulosin 0.4 MG Caps capsule Commonly known as:  FLOMAX TAKE 1 CAPSULE BY MOUTH EVERY DAY       Allergies:  Allergies  Allergen Reactions  . Contrast Media [Iodinated Diagnostic Agents] Nausea And Vomiting    Patient vomits immediately after IV contrast is injected just prior to start of scan, was unable to scan patient in correct time frame.  Patient did Ryan warn me of this when dosing the patient with oral CM and asking the patient history questions until the patient was on the table for the CT scan just prior to injection.  Ct Tech:  Karl Bales 07/29/17   . Other Nausea And Vomiting    Patient vomits immediately after IV contrast is injected just prior to start of scan, was unable to scan patient in correct time frame.  Patient did Ryan warn me of this when dosing the patient with oral CM and asking the patient history questions until the patient was on the table for the CT scan just prior to injection.  Ct Tech:  Karl Bales 07/29/17     Past Medical History, Surgical history, Social history, and Family History were reviewed and updated.  Review of Systems: All other 10 point review of systems is negative.   Physical Exam:  vitals were Ryan taken for this visit.   Wt Readings from Last 3 Encounters:  05/26/18 163 lb (73.9 kg)  05/20/18 166 lb 4 oz (75.4 kg)  04/23/18 174 lb 8 oz (79.2 kg)    Ocular: Sclerae unicteric, pupils equal, round and reactive to light Ear-nose-throat: Oropharynx clear, dentition fair Lymphatic: No cervical or supraclavicular adenopathy Lungs no rales or rhonchi, good excursion bilaterally Heart regular rate and rhythm, no murmur appreciated Abd soft, nontender, positive bowel sounds, no liver or spleen tip palpated on exam, no fluid wave   MSK no focal spinal tenderness, no joint edema Neuro: non-focal, well-oriented, appropriate affect Breasts: Deferred   Lab Results  Component Value Date   WBC 10.1 05/26/2018   HGB 14.0 05/26/2018   HCT 43.0 05/26/2018   MCV 90.5 05/26/2018   PLT 201 05/26/2018   Lab Results  Component Value Date   FERRITIN 216 11/04/2017   IRON 77 11/04/2017   TIBC 261 11/04/2017   UIBC 184 11/04/2017   IRONPCTSAT 30 (L) 11/04/2017   Lab Results  Component Value Date   RBC 4.75 05/26/2018   Lab Results  Component Value Date   KAPLAMBRATIO 0.89 03/28/2016   Lab Results  Component Value Date   IGGSERUM 1,066 03/28/2016   IGMSERUM 106 03/28/2016   No results found for: Odetta Pink, SPEI   Chemistry      Component Value Date/Time   NA 138 05/26/2018 1714   NA 143 12/16/2016 1022   K 3.8 05/26/2018 1714   K 3.5 12/16/2016 1022   CL 106 05/26/2018 1714   CL 101 12/16/2016 1022   CO2 19 (L) 05/26/2018 1714   CO2 33 12/16/2016 1022   BUN 7 05/26/2018 1714   BUN 4 (L) 12/16/2016 1022   CREATININE 0.81 05/26/2018 1714   CREATININE 0.90 05/20/2018 1118   CREATININE 0.8 12/16/2016 1022      Component Value Date/Time   CALCIUM 8.9 05/26/2018 1714   CALCIUM 9.4 12/16/2016 1022   ALKPHOS 78 05/20/2018 1118   ALKPHOS 77 12/16/2016 1022   AST 60 (H) 05/20/2018 1118   ALT 49 (H) 05/20/2018 1118   ALT 16 12/16/2016 1022   BILITOT 0.4 05/20/2018 1118       Impression and Plan: [name] is a very pleasant 58 yo African American gentleman with metastatic prostate cancer. PSA last week was down to 10.7.  He is c/o intermittent burning in his feet and numbness in his legs. ED work up was negative. We will have him try Neurontin 300 mg PO TID. He also plans to follow-up with his neurologist Dr. Ermalene Postin.  I also placed a referral for urology for further work up for recurrent UTI.  We will plan to see him back in another 3 weeks to  re-evaluate.  He will contact our office with any questions or concerns. We can certainly see him sooner if needed.     Laverna Peace, NP 5/1/202011:42 AM

## 2018-06-06 ENCOUNTER — Other Ambulatory Visit: Payer: Self-pay | Admitting: Hematology & Oncology

## 2018-06-06 DIAGNOSIS — C7951 Secondary malignant neoplasm of bone: Secondary | ICD-10-CM

## 2018-06-06 DIAGNOSIS — C61 Malignant neoplasm of prostate: Secondary | ICD-10-CM

## 2018-06-06 DIAGNOSIS — I4891 Unspecified atrial fibrillation: Secondary | ICD-10-CM

## 2018-06-07 ENCOUNTER — Other Ambulatory Visit: Payer: Self-pay | Admitting: Emergency Medicine

## 2018-06-16 ENCOUNTER — Other Ambulatory Visit: Payer: Self-pay | Admitting: *Deleted

## 2018-06-16 DIAGNOSIS — C61 Malignant neoplasm of prostate: Secondary | ICD-10-CM

## 2018-06-16 DIAGNOSIS — C7951 Secondary malignant neoplasm of bone: Secondary | ICD-10-CM

## 2018-06-16 DIAGNOSIS — G8929 Other chronic pain: Secondary | ICD-10-CM

## 2018-06-16 DIAGNOSIS — M5442 Lumbago with sciatica, left side: Secondary | ICD-10-CM

## 2018-06-16 DIAGNOSIS — R52 Pain, unspecified: Secondary | ICD-10-CM

## 2018-06-16 MED ORDER — OXYCODONE-ACETAMINOPHEN 10-325 MG PO TABS: 1.0000 | ORAL_TABLET | Freq: Four times a day (QID) | ORAL | 0 refills | Status: DC | PRN

## 2018-06-16 MED ORDER — FENTANYL 100 MCG/HR TD PT72: 2.0000 | MEDICATED_PATCH | TRANSDERMAL | 0 refills | Status: DC

## 2018-06-18 ENCOUNTER — Inpatient Hospital Stay: Payer: Medicaid Other

## 2018-06-18 ENCOUNTER — Inpatient Hospital Stay (HOSPITAL_BASED_OUTPATIENT_CLINIC_OR_DEPARTMENT_OTHER): Payer: Medicaid Other | Admitting: Family

## 2018-06-18 ENCOUNTER — Encounter: Payer: Self-pay | Admitting: Family

## 2018-06-18 ENCOUNTER — Other Ambulatory Visit: Payer: Self-pay

## 2018-06-18 VITALS — BP 118/87 | HR 76 | Resp 18 | Wt 174.4 lb

## 2018-06-18 DIAGNOSIS — C7951 Secondary malignant neoplasm of bone: Secondary | ICD-10-CM

## 2018-06-18 DIAGNOSIS — C61 Malignant neoplasm of prostate: Secondary | ICD-10-CM | POA: Diagnosis not present

## 2018-06-18 LAB — CBC WITH DIFFERENTIAL (CANCER CENTER ONLY)
Abs Immature Granulocytes: 0.04 10*3/uL (ref 0.00–0.07)
Basophils Absolute: 0.1 10*3/uL (ref 0.0–0.1)
Basophils Relative: 1 %
Eosinophils Absolute: 0.6 10*3/uL — ABNORMAL HIGH (ref 0.0–0.5)
Eosinophils Relative: 5 %
HCT: 43.3 % (ref 39.0–52.0)
Hemoglobin: 14 g/dL (ref 13.0–17.0)
Immature Granulocytes: 0 %
Lymphocytes Relative: 27 %
Lymphs Abs: 2.8 10*3/uL (ref 0.7–4.0)
MCH: 29.5 pg (ref 26.0–34.0)
MCHC: 32.3 g/dL (ref 30.0–36.0)
MCV: 91.4 fL (ref 80.0–100.0)
Monocytes Absolute: 0.7 10*3/uL (ref 0.1–1.0)
Monocytes Relative: 7 %
Neutro Abs: 6 10*3/uL (ref 1.7–7.7)
Neutrophils Relative %: 60 %
Platelet Count: 179 10*3/uL (ref 150–400)
RBC: 4.74 MIL/uL (ref 4.22–5.81)
RDW: 14.8 % (ref 11.5–15.5)
WBC Count: 10.1 10*3/uL (ref 4.0–10.5)
nRBC: 0 % (ref 0.0–0.2)

## 2018-06-18 LAB — CMP (CANCER CENTER ONLY)
ALT: 17 U/L (ref 0–44)
AST: 13 U/L — ABNORMAL LOW (ref 15–41)
Albumin: 4.1 g/dL (ref 3.5–5.0)
Alkaline Phosphatase: 75 U/L (ref 38–126)
Anion gap: 9 (ref 5–15)
BUN: 6 mg/dL (ref 6–20)
CO2: 27 mmol/L (ref 22–32)
Calcium: 9.2 mg/dL (ref 8.9–10.3)
Chloride: 104 mmol/L (ref 98–111)
Creatinine: 0.9 mg/dL (ref 0.61–1.24)
GFR, Est AFR Am: >60 mL/min (ref >60)
GFR, Estimated: >60 mL/min (ref >60)
Glucose, Bld: 144 mg/dL — ABNORMAL HIGH (ref 70–99)
Potassium: 3.9 mmol/L (ref 3.5–5.1)
Sodium: 140 mmol/L (ref 135–145)
Total Bilirubin: 0.3 mg/dL (ref 0.3–1.2)
Total Protein: 6.6 g/dL (ref 6.5–8.1)

## 2018-06-18 MED FILL — ERLEADA 60 MG TABS: 60 | 30 days supply | Qty: 120 | Fill #2

## 2018-06-18 NOTE — Progress Notes (Signed)
Hematology and Oncology Follow Up Visit  Ryan Davis [id number] 1960/03/25 58 y.o. 06/18/2018   Principle Diagnosis:  Metastatic prostate cancer - androgen sensitive  Past Therapy: 14 fractions of radiation to the lumbar spine Casodex 50 mg by mouth daily- stopped 04/02/2017 due to progression Zytiga 1000 mg by mouth daily/prednisone 10 mg by mouth daily - started on 04/20/2016- d/c on 04/02/2017 Xtandi160 mg po q day - startedon 04/06/2017-- d/c on 06/24/2017 Flutamide 250 mg po q8hr - started06/12/2017-- d/c on 04/23/18  Current Therapy:   Lupron 11.5 mg IM every 3 month- next doseJune2020  Zometa 4 mg IV q3 months- next [id number] Apalutamide 240 mg po q day -- startedon 04/28/2018   Interim History:  Ryan Davis is here today for follow-up. He is tolerating his new medication nicely and has gained 11 lbs! He really does look great.  He still has aching pain in his legs that waxes and wanes. He verbalized that he wears his Duragesic patches as prescribed and takes his Oxycodone as needed for any breakthrough pain.  He is ambulating with his cane for added support. No falls or syncopal episodes to report.  No swelling in his extremities at this time.  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  No lymphadenopathy noted on exam.  He states that he has a good appetite and is staying well hydrated.  No episodes of bleeding, no bruising or petechiae.  No fever, chills, n/v, cough, rash, dizziness, SOB, abdominal pain or changes in bowel or bladder habits.  He states that he has occasional angina and that his cardiologist is aware. He is not experiencing any angina at this time.  His wife was recently hospitalized for anemia but thankfully she is back home. 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   ECOG Performance Status: 1 - Symptomatic but completely ambulatory  Medications:  Allergies as of 06/18/2018      Reactions   Contrast Media  [iodinated Diagnostic Agents] Nausea And Vomiting   Patient vomits immediately after IV contrast is injected just prior to start of scan, was unable to scan patient in correct time frame.  Patient did not warn me of this when dosing the patient with oral CM and asking the patient history questions until the patient was on the table for the CT scan just prior to injection.  Ct Tech:  Karl Bales 07/29/17    Other Nausea And Vomiting   Patient vomits immediately after IV contrast is injected just prior to start of scan, was unable to scan patient in correct time frame.  Patient did not warn me of this when dosing the patient with oral CM and asking the patient history questions until the patient was on the table for the CT scan just prior to injection.  Ct Tech:  Karl Bales 07/29/17       Medication List       Accurate as of Jun 18, 2018 12:52 PM. If you have any questions, ask your nurse or doctor.        apalutamide 60 MG tablet Commonly known as:  Erleada Take 4 tablets (240 mg total) by mouth daily. May be taken with or without food. Swallow tablets whole.   CALCIUM-VITAMIN D3 PO Take 1 tablet by mouth daily.   diltiazem 240 MG 24 hr capsule Commonly known as:  CARDIZEM CD TAKE 1 CAPSULE BY MOUTH EVERY DAY   dronabinol 5 MG capsule Commonly known as:  MARINOL Take 1 capsule (5 mg total) by mouth 2 (two) times daily before a meal.   fentaNYL 100 MCG/HR Commonly known as:  DURAGESIC Place 2 patches onto the skin every 3 (three) days.   gabapentin 300 MG capsule Commonly known as:  NEURONTIN Take 1 capsule (300 mg total) by mouth 3 (three) times daily.   megestrol 625 MG/5ML suspension Commonly known as:  MEGACE ES Take 5 mLs (625 mg total) by mouth 2 (two) times daily.   metoprolol tartrate 25 MG tablet Commonly known as:  LOPRESSOR TAKE 1/2 TABLET(12.5 MG) BY MOUTH TWICE DAILY   Narcan 4 MG/0.1ML Liqd nasal spray kit Generic drug:  naloxone NAR REP ALN    ondansetron 4 MG disintegrating tablet Commonly known as:  Zofran ODT Take 1 tablet (4 mg total) by mouth every 8 (eight) hours as needed for nausea or vomiting.   ondansetron 4 MG tablet Commonly known as:  ZOFRAN TAKE 1 TABLET (4 MG TOTAL) BY MOUTH EVERY 6 (SIX) HOURS AS NEEDED FOR NAUSEA OR VOMITING.   oxyCODONE-acetaminophen 10-325 MG tablet Commonly known as:  Percocet Take 1 tablet by mouth every 6 (six) hours as needed for pain.   tamsulosin 0.4 MG Caps capsule Commonly known as:  FLOMAX TAKE 1 CAPSULE BY MOUTH EVERY DAY       Allergies:  Allergies  Allergen Reactions  . Contrast Media [Iodinated Diagnostic Agents] Nausea And Vomiting    Patient vomits immediately after IV contrast is injected just prior to start of scan, was unable to scan patient in correct time frame.  Patient did not warn me of this when dosing the patient with oral CM and asking the patient history questions until the patient was on the table for the CT scan just prior to injection.  Ct Tech:  Karl Bales 07/29/17   . Other Nausea And Vomiting    Patient vomits immediately after IV contrast is injected just prior to start of scan, was unable to scan patient in correct time frame.  Patient did not warn me of this when dosing the patient with oral CM and asking the patient history questions until the patient was on the table for the CT scan just prior to injection.  Ct Tech:  Karl Bales 07/29/17     Past Medical History, Surgical history, Social history, and Family History were reviewed and updated.  Review of Systems: All other 10 point review of systems is negative.   Physical Exam:  weight is 174 lb 6.4 oz (79.1 kg). His blood pressure is 118/87 and his pulse is 76. His respiration is 18 and oxygen saturation is 98%.   Wt Readings from Last 3 Encounters:  06/18/18 174 lb 6.4 oz (79.1 kg)  05/26/18 163 lb (73.9 kg)  05/20/18 166 lb 4 oz (75.4 kg)    Ocular: Sclerae unicteric, pupils equal,  round and reactive to light Ear-nose-throat: Oropharynx clear, dentition fair Lymphatic: No cervical or supraclavicular adenopathy Lungs no rales or rhonchi, good excursion bilaterally Heart regular rate and rhythm, no murmur appreciated Abd soft, nontender, positive bowel sounds, no liver or spleen tip palpated on exam, no fluid wave  MSK no focal spinal tenderness, no joint edema Neuro: non-focal, well-oriented, appropriate affect Breasts: Deferred   Lab Results  Component Value Date   WBC 10.1 06/18/2018   HGB 14.0 06/18/2018   HCT 43.3 06/18/2018   MCV 91.4 06/18/2018   PLT 179 06/18/2018   Lab Results  Component Value Date  FERRITIN 216 11/04/2017   IRON 77 11/04/2017   TIBC 261 11/04/2017   UIBC 184 11/04/2017   IRONPCTSAT 30 (L) 11/04/2017   Lab Results  Component Value Date   RBC 4.74 06/18/2018   Lab Results  Component Value Date   KAPLAMBRATIO 0.89 03/28/2016   Lab Results  Component Value Date   IGGSERUM 1,066 03/28/2016   IGMSERUM 106 03/28/2016   No results found for: Odetta Pink, SPEI   Chemistry      Component Value Date/Time   NA 140 06/18/2018 1107   NA 143 12/16/2016 1022   K 3.9 06/18/2018 1107   K 3.5 12/16/2016 1022   CL 104 06/18/2018 1107   CL 101 12/16/2016 1022   CO2 27 06/18/2018 1107   CO2 33 12/16/2016 1022   BUN 6 06/18/2018 1107   BUN 4 (L) 12/16/2016 1022   CREATININE 0.90 06/18/2018 1107   CREATININE 0.8 12/16/2016 1022      Component Value Date/Time   CALCIUM 9.2 06/18/2018 1107   CALCIUM 9.4 12/16/2016 1022   ALKPHOS 75 06/18/2018 1107   ALKPHOS 77 12/16/2016 1022   AST 13 (L) 06/18/2018 1107   ALT 17 06/18/2018 1107   ALT 16 12/16/2016 1022   BILITOT 0.3 06/18/2018 1107       Impression and Plan: Mr. Abdon is a very pleasant 58 yo African American gentleman with metastatic prostate cancer. He is doing well on Apalutamide and PSA last month was down to 10.7.  Today's result is pending.  He will continue his same regimen and will be due again for Zometa and Lupron at his next visit in June.  We will plan to see him back in another month.  He will contact our office with any questions or concerns. We can certainly see him sooner if need be.    Laverna Peace, NP 5/22/202012:52 PM

## 2018-06-19 LAB — TESTOSTERONE: Testosterone: 8 ng/dL — ABNORMAL LOW (ref 264–916)

## 2018-06-19 LAB — PROSTATE-SPECIFIC AG, SERUM (LABCORP): Prostate Specific Ag, Serum: 0.6 ng/mL (ref 0.0–4.0)

## 2018-06-24 ENCOUNTER — Telehealth: Payer: Self-pay | Admitting: Hematology & Oncology

## 2018-06-24 NOTE — Telephone Encounter (Signed)
Appts scheduled letter/calendar mailed per 5/22 los

## 2018-07-08 ENCOUNTER — Other Ambulatory Visit: Payer: Self-pay | Admitting: Hematology & Oncology

## 2018-07-08 DIAGNOSIS — C61 Malignant neoplasm of prostate: Secondary | ICD-10-CM

## 2018-07-08 DIAGNOSIS — I4891 Unspecified atrial fibrillation: Secondary | ICD-10-CM

## 2018-07-15 ENCOUNTER — Other Ambulatory Visit: Payer: Self-pay | Admitting: *Deleted

## 2018-07-15 DIAGNOSIS — G8929 Other chronic pain: Secondary | ICD-10-CM

## 2018-07-15 DIAGNOSIS — R52 Pain, unspecified: Secondary | ICD-10-CM

## 2018-07-15 DIAGNOSIS — C61 Malignant neoplasm of prostate: Secondary | ICD-10-CM

## 2018-07-15 DIAGNOSIS — M5442 Lumbago with sciatica, left side: Secondary | ICD-10-CM

## 2018-07-15 MED ORDER — FENTANYL 100 MCG/HR TD PT72: 2.0000 | MEDICATED_PATCH | TRANSDERMAL | 0 refills | Status: DC

## 2018-07-15 MED ORDER — OXYCODONE-ACETAMINOPHEN 10-325 MG PO TABS: 1.0000 | ORAL_TABLET | Freq: Four times a day (QID) | ORAL | 0 refills | Status: DC | PRN

## 2018-07-15 MED FILL — ERLEADA 60 MG TABS: 60 | 30 days supply | Qty: 120 | Fill #3

## 2018-07-19 ENCOUNTER — Ambulatory Visit: Payer: Medicaid Other

## 2018-07-19 ENCOUNTER — Other Ambulatory Visit: Payer: Medicaid Other

## 2018-07-19 ENCOUNTER — Ambulatory Visit: Payer: Medicaid Other | Admitting: Family

## 2018-07-26 ENCOUNTER — Inpatient Hospital Stay: Payer: Medicaid Other | Attending: Hematology & Oncology

## 2018-07-26 ENCOUNTER — Other Ambulatory Visit: Payer: Self-pay

## 2018-07-26 ENCOUNTER — Inpatient Hospital Stay (HOSPITAL_BASED_OUTPATIENT_CLINIC_OR_DEPARTMENT_OTHER): Payer: Medicaid Other | Admitting: Family

## 2018-07-26 ENCOUNTER — Inpatient Hospital Stay: Payer: Medicaid Other

## 2018-07-26 ENCOUNTER — Telehealth: Payer: Self-pay | Admitting: Family

## 2018-07-26 ENCOUNTER — Encounter: Payer: Self-pay | Admitting: Family

## 2018-07-26 VITALS — BP 125/78 | HR 80 | Temp 98.0°F | Resp 18 | Ht 74.5 in | Wt 179.0 lb

## 2018-07-26 VITALS — Temp 98.1°F

## 2018-07-26 DIAGNOSIS — F17210 Nicotine dependence, cigarettes, uncomplicated: Secondary | ICD-10-CM | POA: Diagnosis not present

## 2018-07-26 DIAGNOSIS — Z5111 Encounter for antineoplastic chemotherapy: Secondary | ICD-10-CM | POA: Diagnosis not present

## 2018-07-26 DIAGNOSIS — C7951 Secondary malignant neoplasm of bone: Secondary | ICD-10-CM | POA: Insufficient documentation

## 2018-07-26 DIAGNOSIS — C61 Malignant neoplasm of prostate: Secondary | ICD-10-CM

## 2018-07-26 DIAGNOSIS — R0602 Shortness of breath: Secondary | ICD-10-CM

## 2018-07-26 LAB — CMP (CANCER CENTER ONLY)
ALT: 9 U/L (ref 0–44)
AST: 12 U/L — ABNORMAL LOW (ref 15–41)
Albumin: 4.3 g/dL (ref 3.5–5.0)
Alkaline Phosphatase: 65 U/L (ref 38–126)
Anion gap: 10 (ref 5–15)
BUN: 8 mg/dL (ref 6–20)
CO2: 25 mmol/L (ref 22–32)
Calcium: 9.3 mg/dL (ref 8.9–10.3)
Chloride: 103 mmol/L (ref 98–111)
Creatinine: 0.8 mg/dL (ref 0.61–1.24)
GFR, Est AFR Am: >60 mL/min (ref >60)
GFR, Estimated: >60 mL/min (ref >60)
Glucose, Bld: 108 mg/dL — ABNORMAL HIGH (ref 70–99)
Potassium: 3.5 mmol/L (ref 3.5–5.1)
Sodium: 138 mmol/L (ref 135–145)
Total Bilirubin: 0.3 mg/dL (ref 0.3–1.2)
Total Protein: 7.1 g/dL (ref 6.5–8.1)

## 2018-07-26 LAB — CBC WITH DIFFERENTIAL (CANCER CENTER ONLY)
Abs Immature Granulocytes: 0.02 10*3/uL (ref 0.00–0.07)
Basophils Absolute: 0.1 10*3/uL (ref 0.0–0.1)
Basophils Relative: 1 %
Eosinophils Absolute: 0.5 10*3/uL (ref 0.0–0.5)
Eosinophils Relative: 6 %
HCT: 40.3 % (ref 39.0–52.0)
Hemoglobin: 13.3 g/dL (ref 13.0–17.0)
Immature Granulocytes: 0 %
Lymphocytes Relative: 35 %
Lymphs Abs: 3 10*3/uL (ref 0.7–4.0)
MCH: 30 pg (ref 26.0–34.0)
MCHC: 33 g/dL (ref 30.0–36.0)
MCV: 90.8 fL (ref 80.0–100.0)
Monocytes Absolute: 0.7 10*3/uL (ref 0.1–1.0)
Monocytes Relative: 9 %
Neutro Abs: 4.1 10*3/uL (ref 1.7–7.7)
Neutrophils Relative %: 49 %
Platelet Count: 147 10*3/uL — ABNORMAL LOW (ref 150–400)
RBC: 4.44 MIL/uL (ref 4.22–5.81)
RDW: 14.6 % (ref 11.5–15.5)
WBC Count: 8.5 10*3/uL (ref 4.0–10.5)
nRBC: 0 % (ref 0.0–0.2)

## 2018-07-26 MED ORDER — LEUPROLIDE ACETATE (3 MONTH) 11.25 MG IM KIT
11.2500 mg | PACK | Freq: Once | INTRAMUSCULAR | Status: AC
  Administered 2018-07-26: 11.25 mg via INTRAMUSCULAR
  Filled 2018-07-26: qty 11.25

## 2018-07-26 MED ORDER — SODIUM CHLORIDE 0.9% FLUSH
10.0000 mL | INTRAVENOUS | Status: DC | PRN
  Filled 2018-07-26: qty 10

## 2018-07-26 MED ORDER — SODIUM CHLORIDE 0.9 % IV SOLN
INTRAVENOUS | Status: DC
  Filled 2018-07-26: qty 250

## 2018-07-26 MED ORDER — HEPARIN SOD (PORK) LOCK FLUSH 100 UNIT/ML IV SOLN
500.0000 [IU] | Freq: Once | INTRAVENOUS | Status: DC
  Filled 2018-07-26: qty 5

## 2018-07-26 MED ORDER — ZOLEDRONIC ACID 4 MG/100ML IV SOLN
4.0000 mg | Freq: Once | INTRAVENOUS | Status: AC
  Administered 2018-07-26: 4 mg via INTRAVENOUS
  Filled 2018-07-26: qty 100

## 2018-07-26 NOTE — Progress Notes (Signed)
Hematology and Oncology Follow Up Visit  Ryan Davis [id number] 07/17/1960 58 y.o. 07/26/2018   Principle Diagnosis:  Metastatic prostate cancer - androgen sensitive  Past Therapy: 14 fractions of radiation to the lumbar spine Casodex 50 mg by mouth daily- stopped 04/02/2017 due to progression Zytiga 1000 mg by mouth daily/prednisone 10 mg by mouth daily - started on 04/20/2016- d/c on 04/02/2017 Xtandi160 mg po q day - startedon 04/06/2017-- d/c on 06/24/2017 Flutamide 250 mg po q8hr - started06/12/2017-- d/c on 04/23/18  Current Therapy:   Lupron 11.5 mg IM every 3 month- next doseJune2020  Zometa 4 mg IV q3 months- next [id number] Apalutamide 240 mg po q day -- startedon 04/28/2018   Interim History:  Mr. Mowers is here today for follow-up and injections. He continues to do well and has no complaints at this time.  His PSA in May was 0.6 and testosterone was 8.  He verbalized that he is taking the Erleada as prescribed and tolerating it nicely.  He still has some mild SOB and fatigue with over exertion.  He stays busy helping with his wife and he has also been mowing and tending his garden.  He is still smoking 1/2 ppd.  No fever, chills, n/v, cough, rash, dizziness, chest pain, palpitations, abdominal pain or changes in bowel or bladder habits.  No swelling in his extremities at this time. The numbness and tingling in his hands and feet comes and goes.  He uses his cane when ambulating for added support. No falls or syncopal episodes to report.  He states that since starting the Duragesic patches his pain is now well controlled.  He is eating well and staying hydrated. His weight is stable.  No lymphadenopathy noted on exam.  No episodes of bleeding, no bruising or petechiae.   ECOG Performance Status: 1 - Symptomatic but completely ambulatory  Medications:  Allergies as of 07/26/2018      Reactions   Contrast Media [iodinated Diagnostic Agents] Nausea  And Vomiting   Patient vomits immediately after IV contrast is injected just prior to start of scan, was unable to scan patient in correct time frame.  Patient did not warn me of this when dosing the patient with oral CM and asking the patient history questions until the patient was on the table for the CT scan just prior to injection.  Ct Tech:  Karl Bales 07/29/17    Other Nausea And Vomiting   Patient vomits immediately after IV contrast is injected just prior to start of scan, was unable to scan patient in correct time frame.  Patient did not warn me of this when dosing the patient with oral CM and asking the patient history questions until the patient was on the table for the CT scan just prior to injection.  Ct Tech:  Karl Bales 07/29/17       Medication List       Accurate as of July 26, 2018 11:51 AM. If you have any questions, ask your nurse or doctor.        apalutamide 60 MG tablet Commonly known as: Erleada Take 4 tablets (240 mg total) by mouth daily. May be taken with or without food. Swallow tablets whole.   CALCIUM-VITAMIN D3 PO Take 1 tablet by mouth daily.   Cartia XT 240 MG 24 hr capsule Generic drug: diltiazem TAKE 1 CAPSULE BY MOUTH EVERY DAY   dronabinol 5 MG capsule Commonly known as: MARINOL Take 1 capsule (5 mg total) by mouth  2 (two) times daily before a meal.   fentaNYL 100 MCG/HR Commonly known as: [place] 2 patches onto the skin every 3 (three) days.   gabapentin 300 MG capsule Commonly known as: NEURONTIN Take 1 capsule (300 mg total) by mouth 3 (three) times daily.   megestrol 625 MG/5ML suspension Commonly known as: MEGACE ES Take 5 mLs (625 mg total) by mouth 2 (two) times daily.   metoprolol tartrate 25 MG tablet Commonly known as: LOPRESSOR TAKE 1/2 TABLET(12.5 MG) BY MOUTH TWICE DAILY   Narcan 4 MG/0.1ML Liqd nasal spray kit Generic drug: naloxone NAR REP ALN   ondansetron 4 MG disintegrating tablet Commonly known as:  Zofran ODT Take 1 tablet (4 mg total) by mouth every 8 (eight) hours as needed for nausea or vomiting.   ondansetron 4 MG tablet Commonly known as: ZOFRAN TAKE 1 TABLET (4 MG TOTAL) BY MOUTH EVERY 6 (SIX) HOURS AS NEEDED FOR NAUSEA OR VOMITING.   oxyCODONE-acetaminophen 10-325 MG tablet Commonly known as: Percocet Take 1 tablet by mouth every 6 (six) hours as needed for pain.   tamsulosin 0.4 MG Caps capsule Commonly known as: FLOMAX TAKE 1 CAPSULE BY MOUTH EVERY DAY       Allergies:  Allergies  Allergen Reactions  . Contrast Media [Iodinated Diagnostic Agents] Nausea And Vomiting    Patient vomits immediately after IV contrast is injected just prior to start of scan, was unable to scan patient in correct time frame.  Patient did not warn me of this when dosing the patient with oral CM and asking the patient history questions until the patient was on the table for the CT scan just prior to injection.  Ct Tech:  Karl Bales 07/29/17   . Other Nausea And Vomiting    Patient vomits immediately after IV contrast is injected just prior to start of scan, was unable to scan patient in correct time frame.  Patient did not warn me of this when dosing the patient with oral CM and asking the patient history questions until the patient was on the table for the CT scan just prior to injection.  Ct Tech:  Karl Bales 07/29/17     Past Medical History, Surgical history, Social history, and Family History were reviewed and updated.  Review of Systems: All other 10 point review of systems is negative.   Physical Exam:  vitals were not taken for this visit.   Wt Readings from Last 3 Encounters:  06/18/18 174 lb 6.4 oz (79.1 kg)  05/26/18 163 lb (73.9 kg)  05/20/18 166 lb 4 oz (75.4 kg)    Ocular: Sclerae unicteric, pupils equal, round and reactive to light Ear-nose-throat: Oropharynx clear, dentition fair Lymphatic: No cervical or supraclavicular adenopathy Lungs no rales or rhonchi, good  excursion bilaterally Heart regular rate and rhythm, no murmur appreciated Abd soft, nontender, positive bowel sounds, no liver or spleen tip palpated on exam, no fluid wave  MSK no focal spinal tenderness, no joint edema Neuro: non-focal, well-oriented, appropriate affect Breasts: Deferred   Lab Results  Component Value Date   WBC 8.5 07/26/2018   HGB 13.3 07/26/2018   HCT 40.3 07/26/2018   MCV 90.8 07/26/2018   PLT 147 (L) 07/26/2018   Lab Results  Component Value Date   FERRITIN 216 11/04/2017   IRON 77 11/04/2017   TIBC 261 11/04/2017   UIBC 184 11/04/2017   IRONPCTSAT 30 (L) 11/04/2017   Lab Results  Component Value Date   RBC 4.44 07/26/2018  Lab Results  Component Value Date   KAPLAMBRATIO 0.89 03/28/2016   Lab Results  Component Value Date   IGGSERUM 1,066 03/28/2016   IGMSERUM 106 03/28/2016   No results found for: Odetta Pink, SPEI   Chemistry      Component Value Date/Time   NA 138 07/26/2018 1102   NA 143 12/16/2016 1022   K 3.5 07/26/2018 1102   K 3.5 12/16/2016 1022   CL 103 07/26/2018 1102   CL 101 12/16/2016 1022   CO2 25 07/26/2018 1102   CO2 33 12/16/2016 1022   BUN 8 07/26/2018 1102   BUN 4 (L) 12/16/2016 1022   CREATININE 0.80 07/26/2018 1102   CREATININE 0.8 12/16/2016 1022      Component Value Date/Time   CALCIUM 9.3 07/26/2018 1102   CALCIUM 9.4 12/16/2016 1022   ALKPHOS 65 07/26/2018 1102   ALKPHOS 77 12/16/2016 1022   AST 12 (L) 07/26/2018 1102   ALT 9 07/26/2018 1102   ALT 16 12/16/2016 1022   BILITOT 0.3 07/26/2018 1102       Impression and Plan: Mr. Altamura is  A very pleasant 58 yo African American gentleman with metastatic prostate cancer. He has had a nice response to Trumann and is taking as prescribed. He will continue with his same regimen.  He received Zometa and Lupron today as planned.  We will plan to see him back in another 6 weeks.  We will contact our  office with any questions or concerns. We can certainly see him sooner if needed.   Laverna Peace, NP 6/29/202011:51 AM

## 2018-07-26 NOTE — Telephone Encounter (Signed)
Appointments scheduled avs/calendar printed per 6/29 los

## 2018-07-26 NOTE — Patient Instructions (Signed)
Zoledronic Acid injection (Hypercalcemia, Oncology) What is this medicine? ZOLEDRONIC ACID (ZOE le dron ik AS id) lowers the amount of calcium loss from bone. It is used to treat too much calcium in your blood from cancer. It is also used to prevent complications of cancer that has spread to the bone. This medicine may be used for other purposes; ask your health care provider or pharmacist if you have questions. COMMON BRAND NAME(S): Zometa What should I tell my health care provider before I take this medicine? They need to know if you have any of these conditions:  aspirin-sensitive asthma  cancer, especially if you are receiving medicines used to treat cancer  dental disease or wear dentures  infection  kidney disease  receiving corticosteroids like dexamethasone or prednisone  an unusual or allergic reaction to zoledronic acid, other medicines, foods, dyes, or preservatives  pregnant or trying to get pregnant  breast-feeding How should I use this medicine? This medicine is for infusion into a vein. It is given by a health care professional in a hospital or clinic setting. Talk to your pediatrician regarding the use of this medicine in children. Special care may be needed. Overdosage: If you think you have taken too much of this medicine contact a poison control center or emergency room at once. NOTE: This medicine is only for you. Do not share this medicine with others. What if I miss a dose? It is important not to miss your dose. Call your doctor or health care professional if you are unable to keep an appointment. What may interact with this medicine?  certain antibiotics given by injection  NSAIDs, medicines for pain and inflammation, like ibuprofen or naproxen  some diuretics like bumetanide, furosemide  teriparatide  thalidomide This list may not describe all possible interactions. Give your health care provider a list of all the medicines, herbs, non-prescription  drugs, or dietary supplements you use. Also tell them if you smoke, drink alcohol, or use illegal drugs. Some items may interact with your medicine. What should I watch for while using this medicine? Visit your doctor or health care professional for regular checkups. It may be some time before you see the benefit from this medicine. Do not stop taking your medicine unless your doctor tells you to. Your doctor may order blood tests or other tests to see how you are doing. Women should inform their doctor if they wish to become pregnant or think they might be pregnant. There is a potential for serious side effects to an unborn child. Talk to your health care professional or pharmacist for more information. You should make sure that you get enough calcium and vitamin D while you are taking this medicine. Discuss the foods you eat and the vitamins you take with your health care professional. Some people who take this medicine have severe bone, joint, and/or muscle pain. This medicine may also increase your risk for jaw problems or a broken thigh bone. Tell your doctor right away if you have severe pain in your jaw, bones, joints, or muscles. Tell your doctor if you have any pain that does not go away or that gets worse. Tell your dentist and dental surgeon that you are taking this medicine. You should not have major dental surgery while on this medicine. See your dentist to have a dental exam and fix any dental problems before starting this medicine. Take good care of your teeth while on this medicine. Make sure you see your dentist for regular follow-up   appointments. °What side effects may I notice from receiving this medicine? °Side effects that you should report to your doctor or health care professional as soon as possible: °· allergic reactions like skin rash, itching or hives, swelling of the face, lips, or tongue °· anxiety, confusion, or depression °· breathing problems °· changes in vision °· eye  pain °· feeling faint or lightheaded, falls °· jaw pain, especially after dental work °· mouth sores °· muscle cramps, stiffness, or weakness °· redness, blistering, peeling or loosening of the skin, including inside the mouth °· trouble passing urine or change in the amount of urine °Side effects that usually do not require medical attention (report to your doctor or health care professional if they continue or are bothersome): °· bone, joint, or muscle pain °· constipation °· diarrhea °· fever °· hair loss °· irritation at site where injected °· loss of appetite °· nausea, vomiting °· stomach upset °· trouble sleeping °· trouble swallowing °· weak or tired °This list may not describe all possible side effects. Call your doctor for medical advice about side effects. You may report side effects to FDA at 1-800-FDA-1088. °Where should I keep my medicine? °This drug is given in a hospital or clinic and will not be stored at home. °NOTE: This sheet is a summary. It may not cover all possible information. If you have questions about this medicine, talk to your doctor, pharmacist, or health care provider. °© 2020 [employer] (2013-06-11 14:19:39) ° ° ° °Leuprolide injection °What is this medicine? °LEUPROLIDE (loo PROE lide) is a man-made hormone. It is used to treat the symptoms of prostate cancer. This medicine may also be used to treat children with early onset of puberty. It may be used for other hormonal conditions. °This medicine may be used for other purposes; ask your health care provider or pharmacist if you have questions. °COMMON BRAND NAME(S): Lupron °What should I tell my health care provider before I take this medicine? °They need to know if you have any of these conditions: °· diabetes °· heart disease or previous heart attack °· high blood pressure °· high cholesterol °· pain or difficulty passing urine °· spinal cord metastasis °· stroke °· tobacco smoker °· an unusual or allergic reaction to  leuprolide, benzyl alcohol, other medicines, foods, dyes, or preservatives °· pregnant or trying to get pregnant °· breast-feeding °How should I use this medicine? °This medicine is for injection under the skin or into a muscle. You will be taught how to prepare and give this medicine. Use exactly as directed. Take your medicine at regular intervals. Do not take your medicine more often than directed. °It is important that you put your used needles and syringes in a special sharps container. Do not put them in a trash can. If you do not have a sharps container, call your pharmacist or healthcare provider to get one. °A special MedGuide will be given to you by the pharmacist with each prescription and refill. Be sure to read this information carefully each time. °Talk to your pediatrician regarding the use of this medicine in children. While this medicine may be prescribed for children as young as 8 years for selected conditions, precautions do apply. °Overdosage: If you think you have taken too much of this medicine contact a poison control center or emergency room at once. °NOTE: This medicine is only for you. Do not share this medicine with others. °What if I miss a dose? °If you miss a dose, take   it as soon as you can. If it is almost time for your next dose, take only that dose. Do not take double or extra doses. °What may interact with this medicine? °Do not take this medicine with any of the following medications: °· chasteberry °This medicine may also interact with the following medications: °· herbal or dietary supplements, like black cohosh or DHEA °· male hormones, like estrogens or progestins and birth control pills, patches, rings, or injections °· male hormones, like testosterone °This list may not describe all possible interactions. Give your health care provider a list of all the medicines, herbs, non-prescription drugs, or dietary supplements you use. Also tell them if you smoke, drink alcohol, or  use illegal drugs. Some items may interact with your medicine. °What should I watch for while using this medicine? °Visit your doctor or health care professional for regular checks on your progress. During the first week, your symptoms may get worse, but then will improve as you continue your treatment. You may get hot flashes, increased bone pain, increased difficulty passing urine, or an aggravation of nerve symptoms. Discuss these effects with your doctor or health care professional, some of them may improve with continued use of this medicine. °Male patients may experience a menstrual cycle or spotting during the first 2 months of therapy with this medicine. If this continues, contact your doctor or health care professional. °This medicine may increase blood sugar. Ask your healthcare provider if changes in diet or medicines are needed if you have diabetes. °What side effects may I notice from receiving this medicine? °Side effects that you should report to your doctor or health care professional as soon as possible: °· allergic reactions like skin rash, itching or hives, swelling of the face, lips, or tongue °· breathing problems °· chest pain °· depression or memory disorders °· pain in your legs or groin °· pain at site where injected °· severe headache °· signs and symptoms of high blood sugar such as being more thirsty or hungry or having to urinate more than normal. You may also feel very tired or have blurry vision °· swelling of the feet and legs °· visual changes °· vomiting °Side effects that usually do not require medical attention (report to your doctor or health care professional if they continue or are bothersome): °· breast swelling or tenderness °· decrease in sex drive or performance °· diarrhea °· hot flashes °· loss of appetite °· muscle, joint, or bone pains °· nausea °· redness or irritation at site where injected °· skin problems or acne °This list may not describe all possible side  effects. Call your doctor for medical advice about side effects. You may report side effects to FDA at 1-800-FDA-1088. °Where should I keep my medicine? °Keep out of the reach of children. °Store below 25 degrees C (77 degrees F). Do not freeze. Protect from light. Do not use if it is not clear or if there are particles present. Throw away any unused medicine after the expiration date. °NOTE: This sheet is a summary. It may not cover all possible information. If you have questions about this medicine, talk to your doctor, pharmacist, or health care provider. °© 2020 [employer] (2017-11-12 09:52:48) ° °

## 2018-07-27 LAB — PSA, TOTAL AND FREE
PSA, Free Pct: 16.7 %
PSA, Free: 0.05 ng/mL
Prostate Specific Ag, Serum: 0.3 ng/mL (ref 0.0–4.0)

## 2018-07-27 LAB — TESTOSTERONE: Testosterone: 6 ng/dL — ABNORMAL LOW (ref 264–916)

## 2018-08-08 ENCOUNTER — Other Ambulatory Visit: Payer: Self-pay | Admitting: Hematology & Oncology

## 2018-08-13 ENCOUNTER — Other Ambulatory Visit: Payer: Self-pay | Admitting: *Deleted

## 2018-08-13 DIAGNOSIS — C61 Malignant neoplasm of prostate: Secondary | ICD-10-CM

## 2018-08-13 DIAGNOSIS — G8929 Other chronic pain: Secondary | ICD-10-CM

## 2018-08-13 DIAGNOSIS — C7951 Secondary malignant neoplasm of bone: Secondary | ICD-10-CM

## 2018-08-13 DIAGNOSIS — R52 Pain, unspecified: Secondary | ICD-10-CM

## 2018-08-13 DIAGNOSIS — M5441 Lumbago with sciatica, right side: Secondary | ICD-10-CM

## 2018-08-13 MED ORDER — FENTANYL 100 MCG/HR TD PT72: 2.0000 | MEDICATED_PATCH | TRANSDERMAL | 0 refills | Status: DC

## 2018-08-13 MED ORDER — OXYCODONE-ACETAMINOPHEN 10-325 MG PO TABS: 1.0000 | ORAL_TABLET | Freq: Four times a day (QID) | ORAL | 0 refills | Status: DC | PRN

## 2018-08-24 MED FILL — ERLEADA 60 MG TABS: 60 | 30 days supply | Qty: 120 | Fill #4

## 2018-09-05 ENCOUNTER — Other Ambulatory Visit: Payer: Self-pay | Admitting: Hematology & Oncology

## 2018-09-07 ENCOUNTER — Inpatient Hospital Stay (HOSPITAL_BASED_OUTPATIENT_CLINIC_OR_DEPARTMENT_OTHER): Payer: Medicaid Other | Admitting: Hematology & Oncology

## 2018-09-07 ENCOUNTER — Inpatient Hospital Stay: Payer: Medicaid Other | Attending: Hematology & Oncology

## 2018-09-07 ENCOUNTER — Other Ambulatory Visit: Payer: Self-pay

## 2018-09-07 VITALS — BP 116/86 | HR 74 | Temp 97.5°F | Resp 17 | Wt 171.0 lb

## 2018-09-07 DIAGNOSIS — R232 Flushing: Secondary | ICD-10-CM | POA: Insufficient documentation

## 2018-09-07 DIAGNOSIS — C7951 Secondary malignant neoplasm of bone: Secondary | ICD-10-CM

## 2018-09-07 DIAGNOSIS — C61 Malignant neoplasm of prostate: Secondary | ICD-10-CM

## 2018-09-07 LAB — CBC WITH DIFFERENTIAL (CANCER CENTER ONLY)
Abs Immature Granulocytes: 0.02 10*3/uL (ref 0.00–0.07)
Basophils Absolute: 0 10*3/uL (ref 0.0–0.1)
Basophils Relative: 0 %
Eosinophils Absolute: 0.5 10*3/uL (ref 0.0–0.5)
Eosinophils Relative: 5 %
HCT: 41.6 % (ref 39.0–52.0)
Hemoglobin: 13.6 g/dL (ref 13.0–17.0)
Immature Granulocytes: 0 %
Lymphocytes Relative: 26 %
Lymphs Abs: 2.4 10*3/uL (ref 0.7–4.0)
MCH: 29.7 pg (ref 26.0–34.0)
MCHC: 32.7 g/dL (ref 30.0–36.0)
MCV: 90.8 fL (ref 80.0–100.0)
Monocytes Absolute: 0.9 10*3/uL (ref 0.1–1.0)
Monocytes Relative: 10 %
Neutro Abs: 5.4 10*3/uL (ref 1.7–7.7)
Neutrophils Relative %: 59 %
Platelet Count: 155 10*3/uL (ref 150–400)
RBC: 4.58 MIL/uL (ref 4.22–5.81)
RDW: 14.1 % (ref 11.5–15.5)
WBC Count: 9.2 10*3/uL (ref 4.0–10.5)
nRBC: 0 % (ref 0.0–0.2)

## 2018-09-07 LAB — CMP (CANCER CENTER ONLY)
ALT: 14 U/L (ref 0–44)
AST: 12 U/L — ABNORMAL LOW (ref 15–41)
Albumin: 4.2 g/dL (ref 3.5–5.0)
Alkaline Phosphatase: 57 U/L (ref 38–126)
Anion gap: 8 (ref 5–15)
BUN: 12 mg/dL (ref 6–20)
CO2: 29 mmol/L (ref 22–32)
Calcium: 8.9 mg/dL (ref 8.9–10.3)
Chloride: 103 mmol/L (ref 98–111)
Creatinine: 0.87 mg/dL (ref 0.61–1.24)
GFR, Est AFR Am: >60 mL/min (ref >60)
GFR, Estimated: >60 mL/min (ref >60)
Glucose, Bld: 111 mg/dL — ABNORMAL HIGH (ref 70–99)
Potassium: 3.6 mmol/L (ref 3.5–5.1)
Sodium: 140 mmol/L (ref 135–145)
Total Bilirubin: 0.3 mg/dL (ref 0.3–1.2)
Total Protein: 6.7 g/dL (ref 6.5–8.1)

## 2018-09-07 NOTE — Progress Notes (Signed)
Hematology and Oncology Follow Up Visit  Ryan Davis [id number] February 25, 1960 58 y.o. 09/07/2018   Principle Diagnosis:  Metastatic prostate cancer - androgen sensitive  Past Therapy: 14 fractions of radiation to the lumbar spine Casodex 50 mg by mouth daily- stopped 04/02/2017 due to progression Zytiga 1000 mg by mouth daily/prednisone 10 mg by mouth daily - started on 04/20/2016- d/c on 04/02/2017 Xtandi160 mg po q day - startedon 04/06/2017-- d/c on 06/24/2017 Flutamide 250 mg po q8hr - started06/12/2017-- d/c on 04/23/18  Current Therapy:   Lupron 11.5 mg IM every 3 month- next doseSept2020  Zometa 4 mg IV q3 months- next doseSept2020 Apalutamide 240 mg po q day -- startedon 04/28/2018   Interim History:  Ryan Davis is here today for follow-up.  He is doing quite well.  I must say that he probably looks the best that I have seen in a long time.  His wife is doing okay.  She has not been back in the hospital.  She has quite a few issues herself.  His last PSA back in June was down to 0.3.  When we started the apalutamide, he really has done well.  He is taking it daily.  His testosterone level is 6.  He is definitely at a castrate level.  He does have hot flashes.  He does get tired.  I explained to him that all this was because his testosterone has been ablated and then he does not have the "male hormone" to make him feel better.  He has had no cardiac issues.  He has had no bleeding.  He has had no change in bowel or bladder habits.  Overall, his performance status is ECOG 1.  Medications:  Allergies as of 09/07/2018      Reactions   Contrast Media [iodinated Diagnostic Agents] Nausea And Vomiting   Patient vomits immediately after IV contrast is injected just prior to start of scan, was unable to scan patient in correct time frame.  Patient did not warn me of this when dosing the patient with oral CM and asking the patient history questions until the  patient was on the table for the CT scan just prior to injection.  Ct Tech:  Karl Bales 07/29/17    Other Nausea And Vomiting   Patient vomits immediately after IV contrast is injected just prior to start of scan, was unable to scan patient in correct time frame.  Patient did not warn me of this when dosing the patient with oral CM and asking the patient history questions until the patient was on the table for the CT scan just prior to injection.  Ct Tech:  Karl Bales 07/29/17       Medication List       Accurate as of September 07, 2018 11:16 AM. If you have any questions, ask your nurse or doctor.        apalutamide 60 MG tablet Commonly known as: Erleada Take 4 tablets (240 mg total) by mouth daily. May be taken with or without food. Swallow tablets whole.   CALCIUM-VITAMIN D3 PO Take 1 tablet by mouth daily.   Cartia XT 240 MG 24 hr capsule Generic drug: diltiazem TAKE 1 CAPSULE BY MOUTH EVERY DAY   dronabinol 5 MG capsule Commonly known as: MARINOL Take 1 capsule (5 mg total) by mouth 2 (two) times daily before a meal.   fentaNYL 100 MCG/HR Commonly known as: DURAGESIC Place 2 patches onto the skin every 3 (three) days.  gabapentin 300 MG capsule Commonly known as: NEURONTIN Take 1 capsule (300 mg total) by mouth 3 (three) times daily.   megestrol 625 MG/5ML suspension Commonly known as: MEGACE ES Take 5 mLs (625 mg total) by mouth 2 (two) times daily.   metoprolol tartrate 25 MG tablet Commonly known as: LOPRESSOR TAKE 1/2 TABLET(12.5 MG) BY MOUTH TWICE DAILY   Narcan 4 MG/0.1ML Liqd nasal spray kit Generic drug: naloxone NAR REP ALN   ondansetron 4 MG disintegrating tablet Commonly known as: Zofran ODT Take 1 tablet (4 mg total) by mouth every 8 (eight) hours as needed for nausea or vomiting.   ondansetron 4 MG tablet Commonly known as: ZOFRAN TAKE 1 TABLET (4 MG TOTAL) BY MOUTH EVERY 6 (SIX) HOURS AS NEEDED FOR NAUSEA OR VOMITING.    oxyCODONE-acetaminophen 10-325 MG tablet Commonly known as: Percocet Take 1 tablet by mouth every 6 (six) hours as needed for pain.   tamsulosin 0.4 MG Caps capsule Commonly known as: FLOMAX TAKE 1 CAPSULE BY MOUTH EVERY DAY       Allergies:  Allergies  Allergen Reactions  . Contrast Media [Iodinated Diagnostic Agents] Nausea And Vomiting    Patient vomits immediately after IV contrast is injected just prior to start of scan, was unable to scan patient in correct time frame.  Patient did not warn me of this when dosing the patient with oral CM and asking the patient history questions until the patient was on the table for the CT scan just prior to injection.  Ct Tech:  Karl Bales 07/29/17   . Other Nausea And Vomiting    Patient vomits immediately after IV contrast is injected just prior to start of scan, was unable to scan patient in correct time frame.  Patient did not warn me of this when dosing the patient with oral CM and asking the patient history questions until the patient was on the table for the CT scan just prior to injection.  Ct Tech:  Karl Bales 07/29/17     Past Medical History, Surgical history, Social history, and Family History were reviewed and updated.  Review of Systems: Review of Systems  Constitutional: Negative.   HENT: Negative.   Eyes: Negative.   Respiratory: Negative.   Cardiovascular: Negative.   Gastrointestinal: Negative.   Genitourinary: Negative.   Musculoskeletal: Positive for back pain.  Skin: Negative.   Neurological: Negative.   Endo/Heme/Allergies: Negative.   Psychiatric/Behavioral: Negative.      Physical Exam:  weight is 171 lb (77.6 kg). His temporal temperature is 97.5 F (36.4 C) (abnormal). His blood pressure is 116/86 and his pulse is 74. His respiration is 17 and oxygen saturation is 100%.   Wt Readings from Last 3 Encounters:  09/07/18 171 lb (77.6 kg)  07/26/18 179 lb (81.2 kg)  06/18/18 174 lb 6.4 oz (79.1 kg)     Physical Exam Vitals signs reviewed.  HENT:     Head: Normocephalic and atraumatic.  Eyes:     Pupils: Pupils are equal, round, and reactive to light.  Neck:     Musculoskeletal: Normal range of motion.  Cardiovascular:     Rate and Rhythm: Normal rate and regular rhythm.     Heart sounds: Normal heart sounds.  Pulmonary:     Effort: Pulmonary effort is normal.     Breath sounds: Normal breath sounds.  Abdominal:     General: Bowel sounds are normal.     Palpations: Abdomen is soft.  Musculoskeletal: Normal range of  motion.        General: No tenderness or deformity.  Lymphadenopathy:     Cervical: No cervical adenopathy.  Skin:    General: Skin is warm and dry.     Findings: No erythema or rash.  Neurological:     Mental Status: He is alert and oriented to person, place, and time.  Psychiatric:        Behavior: Behavior normal.        Thought Content: Thought content normal.        Judgment: Judgment normal.      Lab Results  Component Value Date   WBC 9.2 09/07/2018   HGB 13.6 09/07/2018   HCT 41.6 09/07/2018   MCV 90.8 09/07/2018   PLT 155 09/07/2018   Lab Results  Component Value Date   FERRITIN 216 11/04/2017   IRON 77 11/04/2017   TIBC 261 11/04/2017   UIBC 184 11/04/2017   IRONPCTSAT 30 (L) 11/04/2017   Lab Results  Component Value Date   RBC 4.58 09/07/2018   Lab Results  Component Value Date   KAPLAMBRATIO 0.89 03/28/2016   Lab Results  Component Value Date   IGGSERUM 1,066 03/28/2016   IGMSERUM 106 03/28/2016   No results found for: Odetta Pink, SPEI   Chemistry      Component Value Date/Time   NA 140 09/07/2018 1004   NA 143 12/16/2016 1022   K 3.6 09/07/2018 1004   K 3.5 12/16/2016 1022   CL 103 09/07/2018 1004   CL 101 12/16/2016 1022   CO2 29 09/07/2018 1004   CO2 33 12/16/2016 1022   BUN 12 09/07/2018 1004   BUN 4 (L) 12/16/2016 1022   CREATININE 0.87 09/07/2018 1004    CREATININE 0.8 12/16/2016 1022      Component Value Date/Time   CALCIUM 8.9 09/07/2018 1004   CALCIUM 9.4 12/16/2016 1022   ALKPHOS 57 09/07/2018 1004   ALKPHOS 77 12/16/2016 1022   AST 12 (L) 09/07/2018 1004   ALT 14 09/07/2018 1004   ALT 16 12/16/2016 1022   BILITOT 0.3 09/07/2018 1004       Impression and Plan: Mr. Crispo is a very pleasant 58 yo African American gentleman with metastatic prostate cancer.   HeI must say that I am incredibly pleased with how well he is done.  Most importantly is the fact that his quality of life is doing so well right now.  His pain is under very good control which is a blessing.  We will go ahead and plan to get him back in another month.  He will get his Lupron and Zometa when we see him back.  Again, I am just happy that he is done so well.  We have been following him for over 2 years and he has had his issues are really he is done nicely and enjoyed himself.    [name], MD 8/11/202011:16 AM

## 2018-09-08 LAB — PSA, TOTAL AND FREE
PSA, Free Pct: 13.3 %
PSA, Free: 0.04 ng/mL
Prostate Specific Ag, Serum: 0.3 ng/mL (ref 0.0–4.0)

## 2018-09-08 LAB — TESTOSTERONE: Testosterone: 10 ng/dL — ABNORMAL LOW (ref 264–916)

## 2018-09-15 ENCOUNTER — Other Ambulatory Visit: Payer: Self-pay | Admitting: *Deleted

## 2018-09-15 DIAGNOSIS — C61 Malignant neoplasm of prostate: Secondary | ICD-10-CM

## 2018-09-15 DIAGNOSIS — G8929 Other chronic pain: Secondary | ICD-10-CM

## 2018-09-15 DIAGNOSIS — R52 Pain, unspecified: Secondary | ICD-10-CM

## 2018-09-15 DIAGNOSIS — C7951 Secondary malignant neoplasm of bone: Secondary | ICD-10-CM

## 2018-09-15 DIAGNOSIS — M5442 Lumbago with sciatica, left side: Secondary | ICD-10-CM

## 2018-09-15 MED ORDER — OXYCODONE-ACETAMINOPHEN 10-325 MG PO TABS: 1.0000 | ORAL_TABLET | Freq: Four times a day (QID) | ORAL | 0 refills | Status: DC | PRN

## 2018-09-15 MED ORDER — FENTANYL 100 MCG/HR TD PT72: 2.0000 | MEDICATED_PATCH | TRANSDERMAL | 0 refills | Status: DC

## 2018-09-27 MED FILL — ERLEADA 60 MG TABS: 60 | 30 days supply | Qty: 120 | Fill #5

## 2018-10-09 ENCOUNTER — Other Ambulatory Visit: Payer: Self-pay | Admitting: Hematology & Oncology

## 2018-10-12 ENCOUNTER — Other Ambulatory Visit: Payer: Self-pay

## 2018-10-12 ENCOUNTER — Other Ambulatory Visit: Payer: Self-pay | Admitting: *Deleted

## 2018-10-12 ENCOUNTER — Inpatient Hospital Stay: Payer: Medicare HMO

## 2018-10-12 ENCOUNTER — Inpatient Hospital Stay (HOSPITAL_BASED_OUTPATIENT_CLINIC_OR_DEPARTMENT_OTHER): Payer: Medicare HMO | Admitting: Family

## 2018-10-12 ENCOUNTER — Inpatient Hospital Stay: Payer: Medicare HMO | Attending: Hematology & Oncology

## 2018-10-12 VITALS — BP 113/80 | HR 82 | Temp 97.9°F | Resp 18 | Wt 175.0 lb

## 2018-10-12 DIAGNOSIS — C61 Malignant neoplasm of prostate: Secondary | ICD-10-CM

## 2018-10-12 DIAGNOSIS — C7951 Secondary malignant neoplasm of bone: Secondary | ICD-10-CM

## 2018-10-12 DIAGNOSIS — R5383 Other fatigue: Secondary | ICD-10-CM | POA: Diagnosis not present

## 2018-10-12 DIAGNOSIS — Z191 Hormone sensitive malignancy status: Secondary | ICD-10-CM | POA: Diagnosis not present

## 2018-10-12 DIAGNOSIS — Z5111 Encounter for antineoplastic chemotherapy: Secondary | ICD-10-CM | POA: Insufficient documentation

## 2018-10-12 LAB — CMP (CANCER CENTER ONLY)
ALT: 9 U/L (ref 0–44)
AST: 10 U/L — ABNORMAL LOW (ref 15–41)
Albumin: 4.4 g/dL (ref 3.5–5.0)
Alkaline Phosphatase: 66 U/L (ref 38–126)
Anion gap: 8 (ref 5–15)
BUN: 8 mg/dL (ref 6–20)
CO2: 29 mmol/L (ref 22–32)
Calcium: 9.5 mg/dL (ref 8.9–10.3)
Chloride: 103 mmol/L (ref 98–111)
Creatinine: 0.89 mg/dL (ref 0.61–1.24)
GFR, Est AFR Am: >60 mL/min (ref >60)
GFR, Estimated: >60 mL/min (ref >60)
Glucose, Bld: 131 mg/dL — ABNORMAL HIGH (ref 70–99)
Potassium: 4.3 mmol/L (ref 3.5–5.1)
Sodium: 140 mmol/L (ref 135–145)
Total Bilirubin: 0.3 mg/dL (ref 0.3–1.2)
Total Protein: 7.1 g/dL (ref 6.5–8.1)

## 2018-10-12 LAB — CBC WITH DIFFERENTIAL (CANCER CENTER ONLY)
Abs Immature Granulocytes: 0.03 10*3/uL (ref 0.00–0.07)
Basophils Absolute: 0.1 10*3/uL (ref 0.0–0.1)
Basophils Relative: 1 %
Eosinophils Absolute: 0.7 10*3/uL — ABNORMAL HIGH (ref 0.0–0.5)
Eosinophils Relative: 6 %
HCT: 43.3 % (ref 39.0–52.0)
Hemoglobin: 13.8 g/dL (ref 13.0–17.0)
Immature Granulocytes: 0 %
Lymphocytes Relative: 25 %
Lymphs Abs: 3 10*3/uL (ref 0.7–4.0)
MCH: 29.9 pg (ref 26.0–34.0)
MCHC: 31.9 g/dL (ref 30.0–36.0)
MCV: 93.9 fL (ref 80.0–100.0)
Monocytes Absolute: 1 10*3/uL (ref 0.1–1.0)
Monocytes Relative: 8 %
Neutro Abs: 7 10*3/uL (ref 1.7–7.7)
Neutrophils Relative %: 60 %
Platelet Count: 146 10*3/uL — ABNORMAL LOW (ref 150–400)
RBC: 4.61 MIL/uL (ref 4.22–5.81)
RDW: 14.9 % (ref 11.5–15.5)
WBC Count: 11.7 10*3/uL — ABNORMAL HIGH (ref 4.0–10.5)
nRBC: 0 % (ref 0.0–0.2)

## 2018-10-12 MED ORDER — LEUPROLIDE ACETATE (3 MONTH) 11.25 MG IM KIT
11.2500 mg | PACK | Freq: Once | INTRAMUSCULAR | Status: AC
  Administered 2018-10-12: 13:00:00 11.25 mg via INTRAMUSCULAR
  Filled 2018-10-12: qty 11.25

## 2018-10-12 MED ORDER — SODIUM CHLORIDE 0.9 % IV SOLN
Freq: Once | INTRAVENOUS | Status: AC
  Administered 2018-10-12: 13:00:00 via INTRAVENOUS
  Filled 2018-10-12: qty 250

## 2018-10-12 MED ORDER — ERLEADA 60 MG PO TABS: 240.0000 mg | ORAL_TABLET | Freq: Every day | ORAL | 6 refills | Status: DC

## 2018-10-12 MED ORDER — ZOLEDRONIC ACID 4 MG/100ML IV SOLN
4.0000 mg | Freq: Once | INTRAVENOUS | Status: AC
  Administered 2018-10-12: 13:00:00 4 mg via INTRAVENOUS
  Filled 2018-10-12: qty 100

## 2018-10-12 NOTE — Progress Notes (Signed)
Hematology and Oncology Follow Up Visit  Ryan Davis [id number] May 02, 1960 58 y.o. 10/12/2018   Principle Diagnosis:  Metastatic prostate cancer - androgen sensitive  Past Therapy: 14 fractions of radiation to the lumbar spine Casodex 50 mg by mouth daily- stopped 04/02/2017 due to progression Zytiga 1000 mg by mouth daily/prednisone 10 mg by mouth daily - started on 04/20/2016- d/c on 04/02/2017 Xtandi160 mg po q day - startedon 04/06/2017-- d/c on 06/24/2017 Flutamide 250 mg po q8hr - started06/12/2017-- d/c on 04/23/18  Current Therapy:   Lupron 11.5 mg IM every 3 month- next doseDec2020  Zometa 4 mg IV q3 months- next doseDec2020 Apalutamide 240 mg po q day -- startedon 04/28/2018   Interim History:  Ryan Davis is here today for follow-up. He is doing well and has no complained at this time.  His testosterone was 10 and PSA 0.3 last month.  He has some mild fatigue at times but will take a break to rest at times.  He does a lot to take care of his wife.  No episodes of bleeding, no bruising or petechiae.  No fever, chills, n/v, cough, rash, dizziness, SOB, chest pain, palpitations, abdominal pain or changes in bowel or bladder habits.  No swelling, tenderness, numbness or tingling in his extremities.  He states that he is eating well and staying hydrated. His weight is stable.  He is still working. 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 He really enjoys this.   ECOG Performance Status: 1 - Symptomatic but completely ambulatory  Medications:  Allergies as of 10/12/2018      Reactions   Contrast Media [iodinated Diagnostic Agents] Nausea And Vomiting   Patient vomits immediately after IV contrast is injected just prior to start of scan, was unable to scan patient in correct time frame.  Patient did not warn me of this when dosing the patient with oral CM and asking the patient history questions until the patient was on the table for the CT scan just  prior to injection.  Ct Tech:  Karl Bales 07/29/17    Other Nausea And Vomiting   Patient vomits immediately after IV contrast is injected just prior to start of scan, was unable to scan patient in correct time frame.  Patient did not warn me of this when dosing the patient with oral CM and asking the patient history questions until the patient was on the table for the CT scan just prior to injection.  Ct Tech:  Karl Bales 07/29/17       Medication List       Accurate as of October 12, 2018 11:46 AM. If you have any questions, ask your nurse or doctor.        apalutamide 60 MG tablet Commonly known as: Erleada Take 4 tablets (240 mg total) by mouth daily. May be taken with or without food. Swallow tablets whole.   CALCIUM-VITAMIN D3 PO Take 1 tablet by mouth daily.   diltiazem 240 MG 24 hr capsule Commonly known as: CARDIZEM CD TAKE 1 CAPSULE BY MOUTH EVERY DAY   dronabinol 5 MG capsule Commonly known as: MARINOL Take 1 capsule (5 mg total) by mouth 2 (two) times daily before a meal.   fentaNYL 100 MCG/HR Commonly known as: DURAGESIC Place 2 patches onto the skin every 3 (three) days.   gabapentin 300 MG capsule Commonly known as: NEURONTIN Take 1 capsule (300 mg total) by mouth 3 (three) times daily.   megestrol 625 MG/5ML suspension  Commonly known as: MEGACE ES Take 5 mLs (625 mg total) by mouth 2 (two) times daily.   metoprolol tartrate 25 MG tablet Commonly known as: LOPRESSOR TAKE 1/2 TABLET(12.5 MG) BY MOUTH TWICE DAILY   Narcan 4 MG/0.1ML Liqd nasal spray kit Generic drug: naloxone NAR REP ALN   ondansetron 4 MG disintegrating tablet Commonly known as: Zofran ODT Take 1 tablet (4 mg total) by mouth every 8 (eight) hours as needed for nausea or vomiting.   ondansetron 4 MG tablet Commonly known as: ZOFRAN TAKE 1 TABLET (4 MG TOTAL) BY MOUTH EVERY 6 (SIX) HOURS AS NEEDED FOR NAUSEA OR VOMITING.   oxyCODONE-acetaminophen 10-325 MG tablet Commonly  known as: Percocet Take 1 tablet by mouth every 6 (six) hours as needed for pain.   tamsulosin 0.4 MG Caps capsule Commonly known as: FLOMAX TAKE 1 CAPSULE BY MOUTH EVERY DAY       Allergies:  Allergies  Allergen Reactions   Contrast Media [Iodinated Diagnostic Agents] Nausea And Vomiting    Patient vomits immediately after IV contrast is injected just prior to start of scan, was unable to scan patient in correct time frame.  Patient did not warn me of this when dosing the patient with oral CM and asking the patient history questions until the patient was on the table for the CT scan just prior to injection.  Ct Tech:  Karl Bales 07/29/17    Other Nausea And Vomiting    Patient vomits immediately after IV contrast is injected just prior to start of scan, was unable to scan patient in correct time frame.  Patient did not warn me of this when dosing the patient with oral CM and asking the patient history questions until the patient was on the table for the CT scan just prior to injection.  Ct Tech:  Karl Bales 07/29/17     Past Medical History, Surgical history, Social history, and Family History were reviewed and updated.  Review of Systems: All other 10 point review of systems is negative.   Physical Exam:  vitals were not taken for this visit.   Wt Readings from Last 3 Encounters:  09/07/18 171 lb (77.6 kg)  07/26/18 179 lb (81.2 kg)  06/18/18 174 lb 6.4 oz (79.1 kg)    Ocular: Sclerae unicteric, pupils equal, round and reactive to light Ear-nose-throat: Oropharynx clear, dentition fair Lymphatic: No cervical or supraclavicular adenopathy Lungs no rales or rhonchi, good excursion bilaterally Heart regular rate and rhythm, no murmur appreciated Abd soft, nontender, positive bowel sounds, no liver or spleen tip palpated on exam, no fluid wave  MSK no focal spinal tenderness, no joint edema Neuro: non-focal, well-oriented, appropriate affect Breasts: Deferred   Lab  Results  Component Value Date   WBC 11.7 (H) 10/12/2018   HGB 13.8 10/12/2018   HCT 43.3 10/12/2018   MCV 93.9 10/12/2018   PLT 146 (L) 10/12/2018   Lab Results  Component Value Date   FERRITIN 216 11/04/2017   IRON 77 11/04/2017   TIBC 261 11/04/2017   UIBC 184 11/04/2017   IRONPCTSAT 30 (L) 11/04/2017   Lab Results  Component Value Date   RBC 4.61 10/12/2018   Lab Results  Component Value Date   KAPLAMBRATIO 0.89 03/28/2016   Lab Results  Component Value Date   IGGSERUM 1,066 03/28/2016   IGMSERUM 106 03/28/2016   No results found for: TOTALPROTELP, ALBUMINELP, A1GS, A2GS, BETS, BETA2SER, GAMS, MSPIKE, SPEI   Chemistry      Component  Value Date/Time   NA 140 09/07/2018 1004   NA 143 12/16/2016 1022   K 3.6 09/07/2018 1004   K 3.5 12/16/2016 1022   CL 103 09/07/2018 1004   CL 101 12/16/2016 1022   CO2 29 09/07/2018 1004   CO2 33 12/16/2016 1022   BUN 12 09/07/2018 1004   BUN 4 (L) 12/16/2016 1022   CREATININE 0.87 09/07/2018 1004   CREATININE 0.8 12/16/2016 1022      Component Value Date/Time   CALCIUM 8.9 09/07/2018 1004   CALCIUM 9.4 12/16/2016 1022   ALKPHOS 57 09/07/2018 1004   ALKPHOS 77 12/16/2016 1022   AST 12 (L) 09/07/2018 1004   ALT 14 09/07/2018 1004   ALT 16 12/16/2016 1022   BILITOT 0.3 09/07/2018 1004       Impression and Plan: Ryan Davis is a very pleasant 58 yo African American gentleman with metastatic prostate cancer. He continues to tolerate treatment with Erleada nicely and has had a good response.  He received Lupron and Zometa today.  We will plan to see him back in another month.  He will contact our office with any questions or concerns. We can certainly see him sooner if needed.   Laverna Peace, NP 9/15/202011:46 AM

## 2018-10-12 NOTE — Patient Instructions (Signed)
Leuprolide injection What is this medicine? LEUPROLIDE (loo PROE lide) is a man-made hormone. It is used to treat the symptoms of prostate cancer. This medicine may also be used to treat children with early onset of puberty. It may be used for other hormonal conditions. This medicine may be used for other purposes; ask your health care provider or pharmacist if you have questions. COMMON BRAND NAME(S): Lupron What should I tell my health care provider before I take this medicine? They need to know if you have any of these conditions:  diabetes  heart disease or previous heart attack  high blood pressure  high cholesterol  pain or difficulty passing urine  spinal cord metastasis  stroke  tobacco smoker  an unusual or allergic reaction to leuprolide, benzyl alcohol, other medicines, foods, dyes, or preservatives  pregnant or trying to get pregnant  breast-feeding How should I use this medicine? This medicine is for injection under the skin or into a muscle. You will be taught how to prepare and give this medicine. Use exactly as directed. Take your medicine at regular intervals. Do not take your medicine more often than directed. It is important that you put your used needles and syringes in a special sharps container. Do not put them in a trash can. If you do not have a sharps container, call your pharmacist or healthcare provider to get one. A special MedGuide will be given to you by the pharmacist with each prescription and refill. Be sure to read this information carefully each time. Talk to your pediatrician regarding the use of this medicine in children. While this medicine may be prescribed for children as young as 8 years for selected conditions, precautions do apply. Overdosage: If you think you have taken too much of this medicine contact a poison control center or emergency room at once. NOTE: This medicine is only for you. Do not share this medicine with others. What if  I miss a dose? If you miss a dose, take it as soon as you can. If it is almost time for your next dose, take only that dose. Do not take double or extra doses. What may interact with this medicine? Do not take this medicine with any of the following medications:  chasteberry This medicine may also interact with the following medications:  herbal or dietary supplements, like black cohosh or DHEA  male hormones, like estrogens or progestins and birth control pills, patches, rings, or injections  male hormones, like testosterone This list may not describe all possible interactions. Give your health care provider a list of all the medicines, herbs, non-prescription drugs, or dietary supplements you use. Also tell them if you smoke, drink alcohol, or use illegal drugs. Some items may interact with your medicine. What should I watch for while using this medicine? Visit your doctor or health care professional for regular checks on your progress. During the first week, your symptoms may get worse, but then will improve as you continue your treatment. You may get hot flashes, increased bone pain, increased difficulty passing urine, or an aggravation of nerve symptoms. Discuss these effects with your doctor or health care professional, some of them may improve with continued use of this medicine. Male patients may experience a menstrual cycle or spotting during the first 2 months of therapy with this medicine. If this continues, contact your doctor or health care professional. This medicine may increase blood sugar. Ask your healthcare provider if changes in diet or medicines are needed if   you have diabetes. What side effects may I notice from receiving this medicine? Side effects that you should report to your doctor or health care professional as soon as possible:  allergic reactions like skin rash, itching or hives, swelling of the face, lips, or tongue  breathing problems  chest  pain  depression or memory disorders  pain in your legs or groin  pain at site where injected  severe headache  signs and symptoms of high blood sugar such as being more thirsty or hungry or having to urinate more than normal. You may also feel very tired or have blurry vision  swelling of the feet and legs  visual changes  vomiting Side effects that usually do not require medical attention (report to your doctor or health care professional if they continue or are bothersome):  breast swelling or tenderness  decrease in sex drive or performance  diarrhea  hot flashes  loss of appetite  muscle, joint, or bone pains  nausea  redness or irritation at site where injected  skin problems or acne This list may not describe all possible side effects. Call your doctor for medical advice about side effects. You may report side effects to FDA at 1-800-FDA-1088. Where should I keep my medicine? Keep out of the reach of children. Store below 25 degrees C (77 degrees F). Do not freeze. Protect from light. Do not use if it is not clear or if there are particles present. Throw away any unused medicine after the expiration date. NOTE: This sheet is a summary. It may not cover all possible information. If you have questions about this medicine, talk to your doctor, pharmacist, or health care provider.  2020 Elsevier/Gold Standard (2017-11-12 09:52:48) Zoledronic Acid injection (Hypercalcemia, Oncology) What is this medicine? ZOLEDRONIC ACID (ZOE le dron ik AS id) lowers the amount of calcium loss from bone. It is used to treat too much calcium in your blood from cancer. It is also used to prevent complications of cancer that has spread to the bone. This medicine may be used for other purposes; ask your health care provider or pharmacist if you have questions. COMMON BRAND NAME(S): Zometa What should I tell my health care provider before I take this medicine? They need to know if you  have any of these conditions:  aspirin-sensitive asthma  cancer, especially if you are receiving medicines used to treat cancer  dental disease or wear dentures  infection  kidney disease  receiving corticosteroids like dexamethasone or prednisone  an unusual or allergic reaction to zoledronic acid, other medicines, foods, dyes, or preservatives  pregnant or trying to get pregnant  breast-feeding How should I use this medicine? This medicine is for infusion into a vein. It is given by a health care professional in a hospital or clinic setting. Talk to your pediatrician regarding the use of this medicine in children. Special care may be needed. Overdosage: If you think you have taken too much of this medicine contact a poison control center or emergency room at once. NOTE: This medicine is only for you. Do not share this medicine with others. What if I miss a dose? It is important not to miss your dose. Call your doctor or health care professional if you are unable to keep an appointment. What may interact with this medicine?  certain antibiotics given by injection  NSAIDs, medicines for pain and inflammation, like ibuprofen or naproxen  some diuretics like bumetanide, furosemide  teriparatide  thalidomide This list may not   describe all possible interactions. Give your health care provider a list of all the medicines, herbs, non-prescription drugs, or dietary supplements you use. Also tell them if you smoke, drink alcohol, or use illegal drugs. Some items may interact with your medicine. What should I watch for while using this medicine? Visit your doctor or health care professional for regular checkups. It may be some time before you see the benefit from this medicine. Do not stop taking your medicine unless your doctor tells you to. Your doctor may order blood tests or other tests to see how you are doing. Women should inform their doctor if they wish to become pregnant or  think they might be pregnant. There is a potential for serious side effects to an unborn child. Talk to your health care professional or pharmacist for more information. You should make sure that you get enough calcium and vitamin D while you are taking this medicine. Discuss the foods you eat and the vitamins you take with your health care professional. Some people who take this medicine have severe bone, joint, and/or muscle pain. This medicine may also increase your risk for jaw problems or a broken thigh bone. Tell your doctor right away if you have severe pain in your jaw, bones, joints, or muscles. Tell your doctor if you have any pain that does not go away or that gets worse. Tell your dentist and dental surgeon that you are taking this medicine. You should not have major dental surgery while on this medicine. See your dentist to have a dental exam and fix any dental problems before starting this medicine. Take good care of your teeth while on this medicine. Make sure you see your dentist for regular follow-up appointments. What side effects may I notice from receiving this medicine? Side effects that you should report to your doctor or health care professional as soon as possible:  allergic reactions like skin rash, itching or hives, swelling of the face, lips, or tongue  anxiety, confusion, or depression  breathing problems  changes in vision  eye pain  feeling faint or lightheaded, falls  jaw pain, especially after dental work  mouth sores  muscle cramps, stiffness, or weakness  redness, blistering, peeling or loosening of the skin, including inside the mouth  trouble passing urine or change in the amount of urine Side effects that usually do not require medical attention (report to your doctor or health care professional if they continue or are bothersome):  bone, joint, or muscle pain  constipation  diarrhea  fever  hair loss  irritation at site where  injected  loss of appetite  nausea, vomiting  stomach upset  trouble sleeping  trouble swallowing  weak or tired This list may not describe all possible side effects. Call your doctor for medical advice about side effects. You may report side effects to FDA at 1-800-FDA-1088. Where should I keep my medicine? This drug is given in a hospital or clinic and will not be stored at home. NOTE: This sheet is a summary. It may not cover all possible information. If you have questions about this medicine, talk to your doctor, pharmacist, or health care provider.  2020 Elsevier/Gold Standard (2013-06-11 14:19:39)  

## 2018-10-13 ENCOUNTER — Other Ambulatory Visit: Payer: Self-pay | Admitting: *Deleted

## 2018-10-13 DIAGNOSIS — M5441 Lumbago with sciatica, right side: Secondary | ICD-10-CM

## 2018-10-13 DIAGNOSIS — C61 Malignant neoplasm of prostate: Secondary | ICD-10-CM

## 2018-10-13 DIAGNOSIS — G8929 Other chronic pain: Secondary | ICD-10-CM

## 2018-10-13 DIAGNOSIS — C7951 Secondary malignant neoplasm of bone: Secondary | ICD-10-CM

## 2018-10-13 DIAGNOSIS — R52 Pain, unspecified: Secondary | ICD-10-CM

## 2018-10-13 LAB — TESTOSTERONE: Testosterone: 10 ng/dL — ABNORMAL LOW (ref 264–916)

## 2018-10-13 LAB — PSA, TOTAL AND FREE
PSA, Free Pct: 10 %
PSA, Free: 0.03 ng/mL
Prostate Specific Ag, Serum: 0.3 ng/mL (ref 0.0–4.0)

## 2018-10-13 MED ORDER — OXYCODONE-ACETAMINOPHEN 10-325 MG PO TABS: 1.0000 | ORAL_TABLET | Freq: Four times a day (QID) | ORAL | 0 refills | Status: DC | PRN

## 2018-10-13 MED ORDER — FENTANYL 100 MCG/HR TD PT72: 2.0000 | MEDICATED_PATCH | TRANSDERMAL | 0 refills | Status: DC

## 2018-10-15 ENCOUNTER — Other Ambulatory Visit: Payer: Self-pay

## 2018-10-15 ENCOUNTER — Emergency Department (HOSPITAL_BASED_OUTPATIENT_CLINIC_OR_DEPARTMENT_OTHER): Payer: Medicare HMO

## 2018-10-15 ENCOUNTER — Encounter (HOSPITAL_BASED_OUTPATIENT_CLINIC_OR_DEPARTMENT_OTHER): Payer: Self-pay | Admitting: Emergency Medicine

## 2018-10-15 ENCOUNTER — Emergency Department (HOSPITAL_BASED_OUTPATIENT_CLINIC_OR_DEPARTMENT_OTHER)
Admission: EM | Admit: 2018-10-15 | Discharge: 2018-10-15 | Disposition: A | Discharge status: Home / Self Care | Payer: Medicare HMO | Attending: Emergency Medicine | Admitting: Emergency Medicine

## 2018-10-15 DIAGNOSIS — R112 Nausea with vomiting, unspecified: Secondary | ICD-10-CM

## 2018-10-15 DIAGNOSIS — R197 Diarrhea, unspecified: Secondary | ICD-10-CM | POA: Diagnosis not present

## 2018-10-15 DIAGNOSIS — I4891 Unspecified atrial fibrillation: Secondary | ICD-10-CM | POA: Diagnosis not present

## 2018-10-15 DIAGNOSIS — F17210 Nicotine dependence, cigarettes, uncomplicated: Secondary | ICD-10-CM | POA: Insufficient documentation

## 2018-10-15 DIAGNOSIS — C7951 Secondary malignant neoplasm of bone: Secondary | ICD-10-CM | POA: Insufficient documentation

## 2018-10-15 DIAGNOSIS — C61 Malignant neoplasm of prostate: Secondary | ICD-10-CM | POA: Insufficient documentation

## 2018-10-15 DIAGNOSIS — Z79899 Other long term (current) drug therapy: Secondary | ICD-10-CM | POA: Diagnosis not present

## 2018-10-15 DIAGNOSIS — R1084 Generalized abdominal pain: Secondary | ICD-10-CM | POA: Diagnosis present

## 2018-10-15 LAB — URINALYSIS, ROUTINE W REFLEX MICROSCOPIC
Bilirubin Urine: NEGATIVE
Glucose, UA: NEGATIVE mg/dL
Hgb urine dipstick: NEGATIVE
Ketones, ur: 15 mg/dL — AB
Leukocytes,Ua: NEGATIVE
Nitrite: NEGATIVE
Protein, ur: NEGATIVE mg/dL
Specific Gravity, Urine: 1.025 (ref 1.005–1.030)
pH: 6 (ref 5.0–8.0)

## 2018-10-15 LAB — CBC
HCT: 41.8 % (ref 39.0–52.0)
Hemoglobin: 14 g/dL (ref 13.0–17.0)
MCH: 30 pg (ref 26.0–34.0)
MCHC: 33.5 g/dL (ref 30.0–36.0)
MCV: 89.5 fL (ref 80.0–100.0)
Platelets: 148 10*3/uL — ABNORMAL LOW (ref 150–400)
RBC: 4.67 MIL/uL (ref 4.22–5.81)
RDW: 14.4 % (ref 11.5–15.5)
WBC: 11.5 10*3/uL — ABNORMAL HIGH (ref 4.0–10.5)
nRBC: 0 % (ref 0.0–0.2)

## 2018-10-15 LAB — COMPREHENSIVE METABOLIC PANEL
ALT: 11 U/L (ref 0–44)
AST: 15 U/L (ref 15–41)
Albumin: 4.2 g/dL (ref 3.5–5.0)
Alkaline Phosphatase: 62 U/L (ref 38–126)
Anion gap: 12 (ref 5–15)
BUN: 8 mg/dL (ref 6–20)
CO2: 22 mmol/L (ref 22–32)
Calcium: 8.7 mg/dL — ABNORMAL LOW (ref 8.9–10.3)
Chloride: 103 mmol/L (ref 98–111)
Creatinine, Ser: 0.92 mg/dL (ref 0.61–1.24)
GFR calc Af Amer: >60 mL/min (ref >60)
GFR calc non Af Amer: >60 mL/min (ref >60)
Glucose, Bld: 116 mg/dL — ABNORMAL HIGH (ref 70–99)
Potassium: 3.5 mmol/L (ref 3.5–5.1)
Sodium: 137 mmol/L (ref 135–145)
Total Bilirubin: 0.4 mg/dL (ref 0.3–1.2)
Total Protein: 7.2 g/dL (ref 6.5–8.1)

## 2018-10-15 LAB — LIPASE, BLOOD: Lipase: 18 U/L (ref 11–51)

## 2018-10-15 MED ORDER — FENTANYL CITRATE (PF) 100 MCG/2ML IJ SOLN
100.0000 ug | Freq: Once | INTRAMUSCULAR | Status: AC
  Administered 2018-10-15: 100 ug via INTRAVENOUS
  Filled 2018-10-15: qty 2

## 2018-10-15 MED ORDER — ONDANSETRON 4 MG PO TBDP: 4.0000 mg | ORAL_TABLET | Freq: Three times a day (TID) | ORAL | 0 refills | Status: DC | PRN

## 2018-10-15 MED ORDER — ONDANSETRON HCL 4 MG/2ML IJ SOLN
4.0000 mg | Freq: Once | INTRAMUSCULAR | Status: AC
  Administered 2018-10-15: 19:00:00 4 mg via INTRAVENOUS
  Filled 2018-10-15: qty 2

## 2018-10-15 MED ORDER — SODIUM CHLORIDE 0.9 % IV BOLUS
1000.0000 mL | Freq: Once | INTRAVENOUS | Status: AC
  Administered 2018-10-15: 1000 mL via INTRAVENOUS

## 2018-10-15 NOTE — ED Notes (Signed)
Pt returned from xray

## 2018-10-15 NOTE — ED Notes (Signed)
Still no urine per Pt

## 2018-10-15 NOTE — ED Provider Notes (Signed)
[name] Provider Note   CSN: [id number] Arrival date & time: 10/15/18  4174     History   Chief Complaint No chief complaint on file.   HPI Ryan Davis is a 58 y.o. male.     The history is provided by the patient and medical records. No language interpreter was used.   Ryan Davis is a 58 y.o. male who presents to the Emergency Department complaining of abdominal pain. He presents to the emergency department complaining of three days of generalized abdominal pain with associated nausea, vomiting, diarrhea. Symptoms are constant, worsening. Pain at times radiates to his back. He has shortness of breath secondary to pain. He denies any fevers, chest pain, dysuria. 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 He did have an injection on September 15. No prior similar symptoms. He is on chronic pain medications as well and states that he is still taking these. No known sick contacts. 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/2016    Patient Active Problem List   Diagnosis Date Noted   Goals of care, counseling/discussion 06/24/2017   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[place]) 03/31/2016   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   Atrial fibrillation ([place]) 03/30/2016   Back pain without radiculopathy 03/28/2016   Atrial fibrillation with RVR ([place]) 03/28/2016    Past Surgical  History:  Procedure Laterality Date   MULTIPLE EXTRACTIONS WITH ALVEOLOPLASTY N/A 11/19/2016   Procedure: EXTRACTION OF TOOTH #'S 1,3,5-7,9-17, AND 20- 29 WITH ALVEOLOPLASTY, BILATERAL MANDIBULAR TORI REDUCTIONS AND BILATERAL MAXILLARY BUCCAL EXOSTOSES REDUCTIONS;  Surgeon: Lenn Cal, DDS;  Location: [facility] ORS;  Service: Oral Surgery;  Laterality: N/A;   NO PAST SURGERIES          Home Medications    Prior to Admission medications   Medication Sig Start Date End Date Taking? Authorizing Provider  apalutamide (ERLEADA) 60 MG tablet Take 4 tablets (240 mg total) by mouth daily. May be taken with or without food. Swallow tablets whole. 10/12/18   [name], MD  Calcium Carbonate-Vitamin D (CALCIUM-VITAMIN D3 PO) Take 1 tablet by mouth daily.    [provider]  diltiazem (CARDIZEM CD) 240 MG 24 hr capsule TAKE 1 CAPSULE BY MOUTH EVERY DAY 10/11/18   [name], MD  dronabinol (MARINOL) 5 MG capsule Take 1 capsule (5 mg total) by mouth 2 (two) times daily before a meal. 10/08/17   [place], Holli Humbles, NP  fentaNYL (DURAGESIC) 100 MCG/HR Place 2 patches onto the skin every 3 (three) days. 10/13/18   [name], MD  gabapentin (NEURONTIN) 300 MG capsule Take 1 capsule (300 mg total) by mouth 3 (three) times daily. 05/28/18   [place], Holli Humbles, NP  megestrol (MEGACE ES) 625 MG/5ML suspension Take 5 mLs (625 mg total) by mouth 2 (two) times daily. 03/24/18   [name],  MD  metoprolol tartrate (LOPRESSOR) 25 MG tablet TAKE 1/2 TABLET(12.5 MG) BY MOUTH TWICE DAILY 07/08/18   [name], MD  NARCAN 4 MG/0.1ML LIQD nasal spray kit NAR REP ALN 02/19/18   [provider]  ondansetron (ZOFRAN ODT) 4 MG disintegrating tablet Take 1 tablet (4 mg total) by mouth every 8 (eight) hours as needed for nausea or vomiting. 10/15/18   Quintella Reichert, MD  ondansetron (ZOFRAN) 4 MG tablet TAKE 1 TABLET (4 MG TOTAL) BY MOUTH EVERY 6 (SIX) HOURS AS NEEDED FOR NAUSEA OR  VOMITING. 02/19/18   [place], Holli Humbles, NP  oxyCODONE-acetaminophen (PERCOCET) 10-325 MG tablet Take 1 tablet by mouth every 6 (six) hours as needed for pain. 10/13/18   [name], MD  tamsulosin (FLOMAX) 0.4 MG CAPS capsule TAKE 1 CAPSULE BY MOUTH EVERY DAY 03/24/18   [place], Holli Humbles, NP    Family History Family History  Problem Relation Age of Onset   Stroke Sister    Lupus Mother    Cancer Neg Hx    Heart disease Neg Hx    Diabetes Neg Hx     Social History Social History   Tobacco Use   Smoking status: Current Some Day Smoker    Packs/day: 0.50    Years: 39.00    Pack years: 19.50    Types: Cigarettes    Last attempt to quit: 10/13/2016    Years since quitting: 2.0   Smokeless tobacco: Never Used  Substance Use Topics   Alcohol use: Not Currently   Drug use: No     Allergies   Contrast media [iodinated diagnostic agents] and Other   Review of Systems Review of Systems  All other systems reviewed and are negative.    Physical Exam Updated Vital Signs BP (!) 128/96    Pulse 68    Temp 99.1 F (37.3 C) (Oral)    Resp 13    SpO2 99%   Physical Exam Vitals signs and nursing note reviewed.  Constitutional:      Appearance: He is well-developed.  HENT:     Head: Normocephalic and atraumatic.  Cardiovascular:     Rate and Rhythm: Normal rate and regular rhythm.     Heart sounds: No murmur.  Pulmonary:     Effort: Pulmonary effort is normal. No respiratory distress.     Breath sounds: Normal breath sounds.  Abdominal:     Palpations: Abdomen is soft.     Tenderness: There is no guarding or rebound.     Comments: Mild generalized abdominal tenderness  Musculoskeletal:        General: No swelling or tenderness.  Skin:    General: Skin is warm and dry.  Neurological:     Mental Status: He is alert and oriented to person, place, and time.  Psychiatric:        Mood and Affect: Mood normal.        Behavior: Behavior normal.      ED  Treatments / Results  Labs (all labs ordered are listed, but only abnormal results are displayed) Labs Reviewed  COMPREHENSIVE METABOLIC PANEL - Abnormal; Notable for the following components:      Result Value   Glucose, Bld 116 (*)    Calcium 8.7 (*)    All other components within normal limits  CBC - Abnormal; Notable for the following components:   WBC 11.5 (*)    Platelets 148 (*)    All other components within normal limits  URINALYSIS, ROUTINE W REFLEX MICROSCOPIC - Abnormal; Notable for the following components:   Ketones, ur 15 (*)    All other components within normal limits  LIPASE, BLOOD    EKG None  Radiology Dg Chest 2 View  Result Date: 10/15/2018 CLINICAL DATA:  Vomiting shortness of breath EXAM: CHEST - 2 VIEW COMPARISON:  Radiograph same day FINDINGS: The heart size and mediastinal contours are within normal limits. There are sclerotic healed or fractures along the right lateral chest wall. The left lung is clear. The prior visualized peripheral opacities are likely overlap of structures. IMPRESSION: No active cardiopulmonary disease. Sclerotic probable healed fracture deformities along the right lateral chest wall. Electronically Signed   By: Prudencio Pair M.D.   On: 10/15/2018 21:57   Dg Chest Port 1 View  Result Date: 10/15/2018 CLINICAL DATA:  Shortness of breath, nausea, vomiting, back pain EXAM: PORTABLE CHEST 1 VIEW COMPARISON:  03/28/2016 FINDINGS: The heart size and mediastinal contours are within normal limits. No definite acute airspace opacity. There is subtle opacity projecting over the peripheral mid lungs bilaterally, which may reflect overlying soft tissue. Unusually symmetric airspace disease is not strictly excluded. The visualized skeletal structures are unremarkable. IMPRESSION: No definite acute airspace opacity. There is subtle opacity projecting over the peripheral mid lungs bilaterally, which may reflect overlying soft tissue. Unusually symmetric  airspace disease is not strictly excluded. PA and lateral radiographs may be helpful to further evaluate. Electronically Signed   By: Eddie Candle M.D.   On: 10/15/2018 19:47    Procedures Procedures (including critical care time)  Medications Ordered in ED Medications  sodium chloride 0.9 % bolus 1,000 mL (0 mLs Intravenous Stopped 10/15/18 2212)  fentaNYL (SUBLIMAZE) injection 100 mcg (100 mcg Intravenous Given 10/15/18 1923)  ondansetron (ZOFRAN) injection 4 mg (4 mg Intravenous Given 10/15/18 1924)     Initial Impression / Assessment and Plan / ED Course  I have reviewed the triage vital signs and the nursing notes.  Pertinent labs & imaging results that were available during my care of the patient were reviewed by me and considered in my medical decision making (see chart for details).        Patient with history of metastatic prostate cancer undergoing treatment here for evaluation of vomiting and diarrhea after receiving Lupron injection three days ago. He has mild tenderness on examination without peritoneal findings. He was treated with pain medications, antiemetics and IV fluids. Labs are significant for mild leukocytosis, otherwise no significant changes. On repeat assessment after treatment he is feeling improved and tolerating orals without difficulty. Presentation is not consistent with bowel obstruction, sepsis, pancreatitis. Discussed with patient home care for nausea vomiting diarrhea. Discussed PCP follow-up and return precautions.  Final Clinical Impressions(s) / ED Diagnoses   Final diagnoses:  Nausea vomiting and diarrhea    ED Discharge Orders         Ordered    ondansetron (ZOFRAN ODT) 4 MG disintegrating tablet  Every 8 hours PRN     10/15/18 2207           Quintella Reichert, MD 10/15/18 2336

## 2018-10-15 NOTE — ED Notes (Signed)
X-ray at bedside

## 2018-10-15 NOTE — ED Notes (Signed)
Pt tolerated PO challenge at this time.

## 2018-10-15 NOTE — ED Triage Notes (Signed)
N/V/D for 3 days. Abd pain and back pain.

## 2018-10-15 NOTE — ED Notes (Signed)
Pt on able to provide urine at this time. Pt will try later.

## 2018-10-21 MED FILL — ERLEADA 60 MG TABS: 60 | 30 days supply | Qty: 120 | Fill #6

## 2018-11-06 ENCOUNTER — Other Ambulatory Visit: Payer: Self-pay | Admitting: Hematology & Oncology

## 2018-11-08 ENCOUNTER — Emergency Department (HOSPITAL_BASED_OUTPATIENT_CLINIC_OR_DEPARTMENT_OTHER)
Admission: EM | Admit: 2018-11-08 | Discharge: 2018-11-09 | Disposition: A | Discharge status: Home / Self Care | Payer: Medicare HMO | Attending: Emergency Medicine | Admitting: Emergency Medicine

## 2018-11-08 ENCOUNTER — Encounter (HOSPITAL_BASED_OUTPATIENT_CLINIC_OR_DEPARTMENT_OTHER): Payer: Self-pay | Admitting: *Deleted

## 2018-11-08 ENCOUNTER — Other Ambulatory Visit: Payer: Self-pay

## 2018-11-08 DIAGNOSIS — Z91041 Radiographic dye allergy status: Secondary | ICD-10-CM | POA: Insufficient documentation

## 2018-11-08 DIAGNOSIS — M545 Low back pain, unspecified: Secondary | ICD-10-CM

## 2018-11-08 DIAGNOSIS — F17210 Nicotine dependence, cigarettes, uncomplicated: Secondary | ICD-10-CM | POA: Insufficient documentation

## 2018-11-08 DIAGNOSIS — Z8546 Personal history of malignant neoplasm of prostate: Secondary | ICD-10-CM | POA: Diagnosis not present

## 2018-11-08 DIAGNOSIS — Z85830 Personal history of malignant neoplasm of bone: Secondary | ICD-10-CM | POA: Insufficient documentation

## 2018-11-08 DIAGNOSIS — Z79899 Other long term (current) drug therapy: Secondary | ICD-10-CM | POA: Diagnosis not present

## 2018-11-08 DIAGNOSIS — I4891 Unspecified atrial fibrillation: Secondary | ICD-10-CM | POA: Diagnosis not present

## 2018-11-08 MED ORDER — HYDROMORPHONE HCL 1 MG/ML IJ SOLN
2.0000 mg | Freq: Once | INTRAMUSCULAR | Status: AC
  Administered 2018-11-08: 2 mg via INTRAMUSCULAR
  Filled 2018-11-08: qty 2

## 2018-11-08 NOTE — ED Triage Notes (Signed)
Pt ambulatory around the waiting room lobby without difficulty.

## 2018-11-08 NOTE — ED Provider Notes (Signed)
[facility] EMERGENCY DEPARTMENT Provider Note   CSN: [id number] Arrival date & time: 11/08/18  [place]     History   Chief Complaint Chief Complaint  Patient presents with  . Back Pain    HPI REFAEL FULOP is a 58 y.o. male.     The history is provided by the patient.  Back Pain Location:  Lumbar spine Quality:  Aching Pain severity:  Moderate Pain is:  Same all the time Onset quality:  Gradual Duration:  3 days Timing:  Constant Progression:  Worsening Chronicity:  Recurrent Context: lifting heavy objects   Relieved by:  Bed rest Exacerbated by: movement. Associated symptoms: no abdominal pain, no bladder incontinence, no bowel incontinence, no chest pain, no dysuria, no fever, no numbness and no weakness   Risk factors: hx of cancer   Patient history of prostate cancer with bone mets presents with back pain.  Patient reports over the past 3 days he is having gradual worsening low back pain.  Patient reports his wife requires significant amount of care and he lifts her frequently.  This seems to have worsened the pain. He has tried OTC meds without improvement He also has narcotic pain medicine at home.  No history of back surgery.  No leg weakness.  No fever.  No incontinence Patient reports he has follow-up tomorrow with his oncologist Past Medical History:  Diagnosis Date  . A-fib [facility])    see 03-28-16 admission  . Back pain without radiculopathy 03/28/2016  . Dyspnea    endorses since treatment radiation , still gets SOB occasionally   . Dysrhythmia    afib   . Goals of care, counseling/discussion 06/24/2017  . History of radiation therapy 09/16/16-10/06/16   35 Gy in 14 fractions to the lumbar spine  . Nocturia   . 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 Prostate cancer metastatic to multiple sites [facility]) 03/31/2016    Patient Active Problem List   Diagnosis Date Noted  . Goals of care, counseling/discussion 06/24/2017  . Prostate  cancer metastatic to multiple sites ([place]) 03/31/2016  . 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 Atrial fibrillation ([place]) 03/30/2016  . Back pain without radiculopathy 03/28/2016  . Atrial fibrillation with RVR ([place]) 03/28/2016    Past Surgical History:  Procedure Laterality Date  . MULTIPLE EXTRACTIONS WITH ALVEOLOPLASTY N/A 11/19/2016   Procedure: EXTRACTION OF TOOTH #'S 1,3,5-7,9-17, AND 20- 29 WITH ALVEOLOPLASTY, BILATERAL MANDIBULAR TORI REDUCTIONS AND BILATERAL MAXILLARY BUCCAL EXOSTOSES REDUCTIONS;  Surgeon: Lenn Cal, DDS;  Location: [facility]S;  Service: Oral Surgery;  Laterality: N/A;  . NO PAST SURGERIES          Home Medications    Prior to Admission medications   Medication Sig Start Date End Date Taking? Authorizing Provider  apalutamide (ERLEADA) 60 MG tablet Take 4 tablets (240 mg total) by mouth daily. May be taken with or without food. Swallow tablets whole. 10/12/18   [name], MD  Calcium Carbonate-Vitamin D (CALCIUM-VITAMIN D3 PO) Take 1 tablet by mouth daily.    [provider]  diltiazem (CARDIZEM CD) 240 MG 24 hr capsule TAKE 1 CAPSULE BY MOUTH EVERY DAY 11/07/18   [name], MD  dronabinol (MARINOL) 5 MG capsule Take 1 capsule (5 mg total) by mouth 2 (two) times daily before a meal. 10/08/17   [place], Holli Humbles, NP  fentaNYL (DURAGESIC) 100 MCG/HR Place 2 patches onto the skin every 3 (three) days. 10/13/18  [name], MD  gabapentin (NEURONTIN) 300 MG capsule Take 1 capsule (300 mg total) by mouth 3 (three) times daily. 05/28/18   [place], Holli Humbles, NP  megestrol (MEGACE ES) 625 MG/5ML suspension Take 5 mLs (625 mg total) by mouth 2 (two) times daily. 03/24/18   [name], MD  metoprolol tartrate (LOPRESSOR) 25 MG tablet TAKE 1/2 TABLET(12.5 MG) BY MOUTH TWICE DAILY 07/08/18   [name], MD  NARCAN 4 MG/0.1ML LIQD nasal spray kit NAR REP ALN 02/19/18   [provider]  ondansetron (ZOFRAN  ODT) 4 MG disintegrating tablet Take 1 tablet (4 mg total) by mouth every 8 (eight) hours as needed for nausea or vomiting. 10/15/18   Quintella Reichert, MD  ondansetron (ZOFRAN) 4 MG tablet TAKE 1 TABLET (4 MG TOTAL) BY MOUTH EVERY 6 (SIX) HOURS AS NEEDED FOR NAUSEA OR VOMITING. 02/19/18   [place], Holli Humbles, NP  oxyCODONE-acetaminophen (PERCOCET) 10-325 MG tablet Take 1 tablet by mouth every 6 (six) hours as needed for pain. 10/13/18   [name], MD  tamsulosin (FLOMAX) 0.4 MG CAPS capsule TAKE 1 CAPSULE BY MOUTH EVERY DAY 03/24/18   [place], Holli Humbles, NP    Family History Family History  Problem Relation Age of Onset  . Stroke Sister   . Lupus Mother   . Cancer Neg Hx   . Heart disease Neg Hx   . Diabetes Neg Hx     Social History Social History   Tobacco Use  . Smoking status: Current Some Day Smoker    Packs/day: 0.50    Years: 39.00    Pack years: 19.50    Types: Cigarettes    Last attempt to quit: 10/13/2016    Years since quitting: 2.0  . Smokeless tobacco: Never Used  Substance Use Topics  . Alcohol use: Not Currently  . Drug use: No     Allergies   Contrast media [iodinated diagnostic agents] and Other   Review of Systems Review of Systems  Constitutional: Negative for fever.  Cardiovascular: Negative for chest pain.  Gastrointestinal: Negative for abdominal pain and bowel incontinence.  Genitourinary: Negative for bladder incontinence, dysuria and frequency.  Musculoskeletal: Positive for back pain.  Neurological: Negative for weakness and numbness.  All other systems reviewed and are negative.    Physical Exam Updated Vital Signs BP 123/84 (BP Location: Right Arm)   Pulse 72   Temp 98.3 F (36.8 C) (Oral)   Resp 18   Ht 1.88 m (6' 2" )   Wt 77.1 kg   SpO2 100%   BMI 21.83 kg/m   Physical Exam CONSTITUTIONAL: Well developed/well nourished HEAD: Normocephalic/atraumatic EYES: EOMI ENMT: Mucous membranes moist NECK: supple no meningeal  signs SPINE/BACK: Diffuse lumbar spinal tenderness, lumbar paraspinal tenderness, no bruising/crepitance/stepoffs noted to spine CV: S1/S2 noted, no murmurs/rubs/gallops noted LUNGS: Lungs are clear to auscultation bilaterally, no apparent distress ABDOMEN: soft, nontender, no rebound or guarding GU:no cva tenderness NEURO: Awake/alert, equal motor 5/5 strength noted with the following: hip flexion/knee flexion/extension, foot dorsi/plantar flexion, great toe extension intact bilaterally, no clonus bilaterally, plantar reflex appropriate (toes downgoing), no sensory deficit in any dermatome.  Equal patellar/achilles reflex noted (2+) in bilateral lower extremities.  Pt is able to ambulate EXTREMITIES: pulses normal, full ROM SKIN: warm, color normal PSYCH: no abnormalities of mood noted, alert and oriented to situation     ED Treatments / Results  Labs (all labs ordered are listed, but only abnormal results are displayed) Labs  Reviewed - No data to display  EKG None  Radiology No results found.  Procedures Procedures    Medications Ordered in ED Medications  HYDROmorphone (DILAUDID) injection 2 mg (2 mg Intramuscular Given 11/08/18 2357)     Initial Impression / Assessment and Plan / ED Course  I have reviewed the triage vital signs and the nursing notes.      Patient reports flareup of his low back pain due to heavy lifting.  He has significant history of prostate cancer with bone mets however he is undergoing chemotherapy with oncology.  He has had this episode before.  Lumbar plain film from April 2020 did not reveal any acute fracture but did reveal mets I did discuss imaging and further work-up due to his history of prostate cancer with bone mets.  However patient declines as he has a follow-up with his oncologist within 12 hours and he has to care for his wife.  He already has pain medicines at home, he is just looking for pain relief at this time.  He has no neuro  deficits.  Patient is ambulating.  No signs of cauda equina at this time. Patient denies any urinary symptoms, low suspicion for UTI He is otherwise well-appearing I feel it is reasonable for the patient to be discharged and follow-up with his oncologist.  We discussed return precautions  Final Clinical Impressions(s) / ED Diagnoses   Final diagnoses:  Acute low back pain without sciatica, unspecified back pain laterality    ED Discharge Orders    None       Ripley Fraise, MD 11/09/18 0002

## 2018-11-08 NOTE — ED Triage Notes (Signed)
Pt reports being in treatment for colon CA. States back pain that started today and thinks that it is being caused by his CA. Pt reports having PCP appointment tomorrow for same.

## 2018-11-08 NOTE — ED Notes (Signed)
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 Pt denies recent injury but reports pain has increased over the last few days.

## 2018-11-08 NOTE — ED Notes (Signed)
ED Provider at bedside. 

## 2018-11-08 NOTE — Discharge Instructions (Signed)

## 2018-11-09 ENCOUNTER — Telehealth: Payer: Self-pay | Admitting: Family

## 2018-11-09 ENCOUNTER — Inpatient Hospital Stay (HOSPITAL_BASED_OUTPATIENT_CLINIC_OR_DEPARTMENT_OTHER): Payer: Medicare HMO | Admitting: Family

## 2018-11-09 ENCOUNTER — Encounter: Payer: Self-pay | Admitting: Family

## 2018-11-09 ENCOUNTER — Inpatient Hospital Stay: Payer: Medicare HMO | Attending: Hematology & Oncology

## 2018-11-09 ENCOUNTER — Other Ambulatory Visit: Payer: Self-pay

## 2018-11-09 DIAGNOSIS — C61 Malignant neoplasm of prostate: Secondary | ICD-10-CM | POA: Insufficient documentation

## 2018-11-09 DIAGNOSIS — R52 Pain, unspecified: Secondary | ICD-10-CM

## 2018-11-09 DIAGNOSIS — C7951 Secondary malignant neoplasm of bone: Secondary | ICD-10-CM | POA: Insufficient documentation

## 2018-11-09 DIAGNOSIS — G629 Polyneuropathy, unspecified: Secondary | ICD-10-CM | POA: Insufficient documentation

## 2018-11-09 LAB — CBC WITH DIFFERENTIAL (CANCER CENTER ONLY)
Abs Immature Granulocytes: 0.02 10*3/uL (ref 0.00–0.07)
Basophils Absolute: 0.1 10*3/uL (ref 0.0–0.1)
Basophils Relative: 1 %
Eosinophils Absolute: 0.3 10*3/uL (ref 0.0–0.5)
Eosinophils Relative: 3 %
HCT: 44.1 % (ref 39.0–52.0)
Hemoglobin: 14.6 g/dL (ref 13.0–17.0)
Immature Granulocytes: 0 %
Lymphocytes Relative: 31 %
Lymphs Abs: 3.7 10*3/uL (ref 0.7–4.0)
MCH: 30.4 pg (ref 26.0–34.0)
MCHC: 33.1 g/dL (ref 30.0–36.0)
MCV: 91.7 fL (ref 80.0–100.0)
Monocytes Absolute: 1 10*3/uL (ref 0.1–1.0)
Monocytes Relative: 8 %
Neutro Abs: 6.7 10*3/uL (ref 1.7–7.7)
Neutrophils Relative %: 57 %
Platelet Count: 189 10*3/uL (ref 150–400)
RBC: 4.81 MIL/uL (ref 4.22–5.81)
RDW: 14.3 % (ref 11.5–15.5)
WBC Count: 11.8 10*3/uL — ABNORMAL HIGH (ref 4.0–10.5)
nRBC: 0 % (ref 0.0–0.2)

## 2018-11-09 LAB — CMP (CANCER CENTER ONLY)
ALT: 11 U/L (ref 0–44)
AST: 16 U/L (ref 15–41)
Albumin: 4.6 g/dL (ref 3.5–5.0)
Alkaline Phosphatase: 69 U/L (ref 38–126)
Anion gap: 11 (ref 5–15)
BUN: 12 mg/dL (ref 6–20)
CO2: 28 mmol/L (ref 22–32)
Calcium: 9 mg/dL (ref 8.9–10.3)
Chloride: 100 mmol/L (ref 98–111)
Creatinine: 1.39 mg/dL — ABNORMAL HIGH (ref 0.61–1.24)
GFR, Est AFR Am: >60 mL/min (ref >60)
GFR, Estimated: 56 mL/min — ABNORMAL LOW (ref >60)
Glucose, Bld: 156 mg/dL — ABNORMAL HIGH (ref 70–99)
Potassium: 3.4 mmol/L — ABNORMAL LOW (ref 3.5–5.1)
Sodium: 139 mmol/L (ref 135–145)
Total Bilirubin: 0.4 mg/dL (ref 0.3–1.2)
Total Protein: 7.5 g/dL (ref 6.5–8.1)

## 2018-11-09 MED ORDER — OXYCODONE-ACETAMINOPHEN 10-325 MG PO TABS: 1.0000 | ORAL_TABLET | ORAL | 0 refills | Status: DC | PRN

## 2018-11-09 NOTE — Progress Notes (Signed)
Hematology and Oncology Follow Up Visit  Ryan Davis [id number] 04-15-1960 58 y.o. 11/09/2018   Principle Diagnosis:  Metastatic prostate cancer - androgen sensitive  Past Therapy: 14 fractions of radiation to the lumbar spine Casodex 50 mg by mouth daily- stopped 04/02/2017 due to progression Zytiga 1000 mg by mouth daily/prednisone 10 mg by mouth daily - started on 04/20/2016- d/c on 04/02/2017 Xtandi160 mg po q day - startedon 04/06/2017-- d/c on 06/24/2017 Flutamide 250 mg po q8hr - started06/12/2017-- d/c on 04/23/18  Current Therapy:   Lupron 11.5 mg IM every 3 month- next doseDec2020  Zometa 4 mg IV q3 months- next doseDec2020 Apalutamide 240 mg po q day -- startedon 04/28/2018   Interim History:  Ryan Davis is here today for follow-up. He was in the ED last night with back pain and received a dilaudid injections. He feels that he strained his back helping move his wife.  He is feeling better but still has generalized aches and pains.  He has a care giver for his wive from 9a-3p and then assumes total care. This can be hard for him and he is trying to find help.  He states that he is doing well on Erleada and is taking has prescribed.  PSA last month was 0.3 and testosterone 10.  No fever, chills, n/v, cough, rash, dizziness, SOB, chest pain, palpitations, abdominal pain or changes in bowel or bladder habits.  The neuropathy in his feet is unchanged.  No swelling in his extremities at this time.  He has maintained a good appetite and is doing his best to stay hydrated.His weight is stable.   ECOG Performance Status: 1 - Symptomatic but completely ambulatory  Medications:  Allergies as of 11/09/2018      Reactions   Contrast Media [iodinated Diagnostic Agents] Nausea And Vomiting   Patient vomits immediately after IV contrast is injected just prior to start of scan, was unable to scan patient in correct time frame.  Patient did not warn me of this when  dosing the patient with oral CM and asking the patient history questions until the patient was on the table for the CT scan just prior to injection.  Ct Tech:  Karl Bales 07/29/17    Other Nausea And Vomiting   Patient vomits immediately after IV contrast is injected just prior to start of scan, was unable to scan patient in correct time frame.  Patient did not warn me of this when dosing the patient with oral CM and asking the patient history questions until the patient was on the table for the CT scan just prior to injection.  Ct Tech:  Karl Bales 07/29/17       Medication List       Accurate as of November 09, 2018 11:11 AM. If you have any questions, ask your nurse or doctor.        CALCIUM-VITAMIN D3 PO Take 1 tablet by mouth daily.   diltiazem 240 MG 24 hr capsule Commonly known as: CARDIZEM CD TAKE 1 CAPSULE BY MOUTH EVERY DAY   dronabinol 5 MG capsule Commonly known as: MARINOL Take 1 capsule (5 mg total) by mouth 2 (two) times daily before a meal.   Erleada 60 MG tablet Generic drug: apalutamide Take 4 tablets (240 mg total) by mouth daily. May be taken with or without food. Swallow tablets whole.   fentaNYL 100 MCG/HR Commonly known as: [place] 2 patches onto the skin every 3 (three) days.   gabapentin 300  MG capsule Commonly known as: NEURONTIN Take 1 capsule (300 mg total) by mouth 3 (three) times daily.   megestrol 625 MG/5ML suspension Commonly known as: MEGACE ES Take 5 mLs (625 mg total) by mouth 2 (two) times daily.   metoprolol tartrate 25 MG tablet Commonly known as: LOPRESSOR TAKE 1/2 TABLET(12.5 MG) BY MOUTH TWICE DAILY   Narcan 4 MG/0.1ML Liqd nasal spray kit Generic drug: naloxone NAR REP ALN   ondansetron 4 MG disintegrating tablet Commonly known as: Zofran ODT Take 1 tablet (4 mg total) by mouth every 8 (eight) hours as needed for nausea or vomiting.   ondansetron 4 MG tablet Commonly known as: ZOFRAN TAKE 1 TABLET (4 MG TOTAL)  BY MOUTH EVERY 6 (SIX) HOURS AS NEEDED FOR NAUSEA OR VOMITING.   oxyCODONE-acetaminophen 10-325 MG tablet Commonly known as: Percocet Take 1 tablet by mouth every 6 (six) hours as needed for pain.   tamsulosin 0.4 MG Caps capsule Commonly known as: FLOMAX TAKE 1 CAPSULE BY MOUTH EVERY DAY       Allergies:  Allergies  Allergen Reactions  . Contrast Media [Iodinated Diagnostic Agents] Nausea And Vomiting    Patient vomits immediately after IV contrast is injected just prior to start of scan, was unable to scan patient in correct time frame.  Patient did not warn me of this when dosing the patient with oral CM and asking the patient history questions until the patient was on the table for the CT scan just prior to injection.  Ct Tech:  Karl Bales 07/29/17   . Other Nausea And Vomiting    Patient vomits immediately after IV contrast is injected just prior to start of scan, was unable to scan patient in correct time frame.  Patient did not warn me of this when dosing the patient with oral CM and asking the patient history questions until the patient was on the table for the CT scan just prior to injection.  Ct Tech:  Karl Bales 07/29/17     Past Medical History, Surgical history, Social history, and Family History were reviewed and updated.  Review of Systems: All other 10 point review of systems is negative.   Physical Exam:  vitals were not taken for this visit.   Wt Readings from Last 3 Encounters:  11/08/18 170 lb (77.1 kg)  10/12/18 175 lb (79.4 kg)  09/07/18 171 lb (77.6 kg)    Ocular: Sclerae unicteric, pupils equal, round and reactive to light Ear-nose-throat: Oropharynx clear, dentition fair Lymphatic: No cervical or supraclavicular adenopathy Lungs no rales or rhonchi, good excursion bilaterally Heart regular rate and rhythm, no murmur appreciated Abd soft, nontender, positive bowel sounds, no liver or spleen tip palpated on exam, no fluid wave  MSK no focal spinal  tenderness, no joint edema Neuro: non-focal, well-oriented, appropriate affect Breasts: Deferred   Lab Results  Component Value Date   WBC 11.8 (H) 11/09/2018   HGB 14.6 11/09/2018   HCT 44.1 11/09/2018   MCV 91.7 11/09/2018   PLT 189 11/09/2018   Lab Results  Component Value Date   FERRITIN 216 11/04/2017   IRON 77 11/04/2017   TIBC 261 11/04/2017   UIBC 184 11/04/2017   IRONPCTSAT 30 (L) 11/04/2017   Lab Results  Component Value Date   RBC 4.81 11/09/2018   Lab Results  Component Value Date   KAPLAMBRATIO 0.89 03/28/2016   Lab Results  Component Value Date   IGGSERUM 1,066 03/28/2016   IGMSERUM 106 03/28/2016   No  results found for: Odetta Pink, SPEI   Chemistry      Component Value Date/Time   NA 137 10/15/2018 1920   NA 143 12/16/2016 1022   K 3.5 10/15/2018 1920   K 3.5 12/16/2016 1022   CL 103 10/15/2018 1920   CL 101 12/16/2016 1022   CO2 22 10/15/2018 1920   CO2 33 12/16/2016 1022   BUN 8 10/15/2018 1920   BUN 4 (L) 12/16/2016 1022   CREATININE 0.92 10/15/2018 1920   CREATININE 0.89 10/12/2018 1117   CREATININE 0.8 12/16/2016 1022      Component Value Date/Time   CALCIUM 8.7 (L) 10/15/2018 1920   CALCIUM 9.4 12/16/2016 1022   ALKPHOS 62 10/15/2018 1920   ALKPHOS 77 12/16/2016 1022   AST 15 10/15/2018 1920   AST 10 (L) 10/12/2018 1117   ALT 11 10/15/2018 1920   ALT 9 10/12/2018 1117   ALT 16 12/16/2016 1022   BILITOT 0.4 10/15/2018 1920   BILITOT 0.3 10/12/2018 1117       Impression and Plan: Ryan Davis is avery pleasant 58 yo African American gentleman with metastatic prostate cancer.His PSA and testosterone has been stable and he is tolerating Erleada nicely.  He will be due again for Lupron and Zometa in December.  I spoke with Dr. Marin Olp and and Lattie Haw in pharmacy, LFT's are stable, we will increase his oxycodone frequency to every 4 hours as needed for pain.  We will see him again  in 2 months for follow-up and injections.   He will contact our office with any questions or concerns. We can certainly see him sooner if needed.   Laverna Peace, NP 10/13/202011:11 AM

## 2018-11-09 NOTE — Telephone Encounter (Signed)
Called and spoke with patient regarding appointments added per 10/13 los

## 2018-11-09 NOTE — Telephone Encounter (Signed)
Called and spoke with patient regarding appointments per 10/13 los

## 2018-11-10 LAB — PSA, TOTAL AND FREE
PSA, Free Pct: 15 %
PSA, Free: 0.06 ng/mL
Prostate Specific Ag, Serum: 0.4 ng/mL (ref 0.0–4.0)

## 2018-11-10 LAB — TESTOSTERONE: Testosterone: 13 ng/dL — ABNORMAL LOW (ref 264–916)

## 2018-11-15 ENCOUNTER — Other Ambulatory Visit: Payer: Self-pay | Admitting: Hematology & Oncology

## 2018-11-15 DIAGNOSIS — G8929 Other chronic pain: Secondary | ICD-10-CM

## 2018-11-15 DIAGNOSIS — C61 Malignant neoplasm of prostate: Secondary | ICD-10-CM

## 2018-11-15 DIAGNOSIS — M5442 Lumbago with sciatica, left side: Secondary | ICD-10-CM

## 2018-11-15 MED ORDER — FENTANYL 100 MCG/HR TD PT72: 2.0000 | MEDICATED_PATCH | TRANSDERMAL | 0 refills | Status: DC

## 2018-11-16 MED FILL — ERLEADA 60 MG TABS: 60 | 30 days supply | Qty: 120 | Fill #0

## 2018-12-07 ENCOUNTER — Other Ambulatory Visit: Payer: Self-pay | Admitting: Hematology & Oncology

## 2018-12-07 ENCOUNTER — Other Ambulatory Visit: Payer: Self-pay | Admitting: Family

## 2018-12-07 DIAGNOSIS — C61 Malignant neoplasm of prostate: Secondary | ICD-10-CM

## 2018-12-07 DIAGNOSIS — R52 Pain, unspecified: Secondary | ICD-10-CM

## 2018-12-07 MED ORDER — OXYCODONE-ACETAMINOPHEN 10-325 MG PO TABS: 1.0000 | ORAL_TABLET | ORAL | 0 refills | Status: DC | PRN

## 2018-12-16 MED FILL — ERLEADA 60 MG TABS: 60 | 30 days supply | Qty: 120 | Fill #1

## 2019-01-03 ENCOUNTER — Other Ambulatory Visit: Payer: Self-pay | Admitting: *Deleted

## 2019-01-03 MED ORDER — DILTIAZEM HCL ER COATED BEADS 240 MG PO CP24: ORAL_CAPSULE | ORAL | 6 refills | Status: DC

## 2019-01-07 ENCOUNTER — Other Ambulatory Visit: Payer: Self-pay | Admitting: *Deleted

## 2019-01-07 DIAGNOSIS — R52 Pain, unspecified: Secondary | ICD-10-CM

## 2019-01-07 DIAGNOSIS — C61 Malignant neoplasm of prostate: Secondary | ICD-10-CM

## 2019-01-07 MED ORDER — OXYCODONE-ACETAMINOPHEN 10-325 MG PO TABS: 1.0000 | ORAL_TABLET | ORAL | 0 refills | Status: DC | PRN

## 2019-01-10 ENCOUNTER — Encounter: Payer: Self-pay | Admitting: Family

## 2019-01-10 ENCOUNTER — Inpatient Hospital Stay (HOSPITAL_BASED_OUTPATIENT_CLINIC_OR_DEPARTMENT_OTHER): Payer: Medicare HMO | Admitting: Family

## 2019-01-10 ENCOUNTER — Inpatient Hospital Stay: Payer: Medicare HMO

## 2019-01-10 ENCOUNTER — Other Ambulatory Visit: Payer: Self-pay

## 2019-01-10 ENCOUNTER — Inpatient Hospital Stay: Payer: Medicare HMO | Attending: Hematology & Oncology

## 2019-01-10 VITALS — BP 103/61 | HR 84 | Temp 97.9°F | Resp 19 | Ht 74.0 in | Wt 171.8 lb

## 2019-01-10 DIAGNOSIS — Z5111 Encounter for antineoplastic chemotherapy: Secondary | ICD-10-CM | POA: Insufficient documentation

## 2019-01-10 DIAGNOSIS — G8929 Other chronic pain: Secondary | ICD-10-CM

## 2019-01-10 DIAGNOSIS — M5442 Lumbago with sciatica, left side: Secondary | ICD-10-CM | POA: Diagnosis not present

## 2019-01-10 DIAGNOSIS — C61 Malignant neoplasm of prostate: Secondary | ICD-10-CM

## 2019-01-10 DIAGNOSIS — C7951 Secondary malignant neoplasm of bone: Secondary | ICD-10-CM

## 2019-01-10 DIAGNOSIS — M5441 Lumbago with sciatica, right side: Secondary | ICD-10-CM

## 2019-01-10 DIAGNOSIS — E291 Testicular hypofunction: Secondary | ICD-10-CM | POA: Insufficient documentation

## 2019-01-10 LAB — CBC WITH DIFFERENTIAL (CANCER CENTER ONLY)
Abs Immature Granulocytes: 0.05 10*3/uL (ref 0.00–0.07)
Basophils Absolute: 0.1 10*3/uL (ref 0.0–0.1)
Basophils Relative: 1 %
Eosinophils Absolute: 0.5 10*3/uL (ref 0.0–0.5)
Eosinophils Relative: 4 %
HCT: 40.6 % (ref 39.0–52.0)
Hemoglobin: 13.4 g/dL (ref 13.0–17.0)
Immature Granulocytes: 0 %
Lymphocytes Relative: 26 %
Lymphs Abs: 3.1 10*3/uL (ref 0.7–4.0)
MCH: 30.3 pg (ref 26.0–34.0)
MCHC: 33 g/dL (ref 30.0–36.0)
MCV: 91.9 fL (ref 80.0–100.0)
Monocytes Absolute: 0.9 10*3/uL (ref 0.1–1.0)
Monocytes Relative: 8 %
Neutro Abs: 7.2 10*3/uL (ref 1.7–7.7)
Neutrophils Relative %: 61 %
Platelet Count: 136 10*3/uL — ABNORMAL LOW (ref 150–400)
RBC: 4.42 MIL/uL (ref 4.22–5.81)
RDW: 14.4 % (ref 11.5–15.5)
WBC Count: 11.9 10*3/uL — ABNORMAL HIGH (ref 4.0–10.5)
nRBC: 0 % (ref 0.0–0.2)

## 2019-01-10 LAB — CMP (CANCER CENTER ONLY)
ALT: 8 U/L (ref 0–44)
AST: 10 U/L — ABNORMAL LOW (ref 15–41)
Albumin: 4.5 g/dL (ref 3.5–5.0)
Alkaline Phosphatase: 64 U/L (ref 38–126)
Anion gap: 9 (ref 5–15)
BUN: 9 mg/dL (ref 6–20)
CO2: 30 mmol/L (ref 22–32)
Calcium: 9.1 mg/dL (ref 8.9–10.3)
Chloride: 102 mmol/L (ref 98–111)
Creatinine: 0.9 mg/dL (ref 0.61–1.24)
GFR, Est AFR Am: >60 mL/min (ref >60)
GFR, Estimated: >60 mL/min (ref >60)
Glucose, Bld: 115 mg/dL — ABNORMAL HIGH (ref 70–99)
Potassium: 3.8 mmol/L (ref 3.5–5.1)
Sodium: 141 mmol/L (ref 135–145)
Total Bilirubin: 0.3 mg/dL (ref 0.3–1.2)
Total Protein: 7 g/dL (ref 6.5–8.1)

## 2019-01-10 MED ORDER — ZOLEDRONIC ACID 4 MG/100ML IV SOLN
4.0000 mg | Freq: Once | INTRAVENOUS | Status: AC
  Administered 2019-01-10: 4 mg via INTRAVENOUS
  Filled 2019-01-10: qty 100

## 2019-01-10 MED ORDER — FENTANYL 100 MCG/HR TD PT72: 2.0000 | MEDICATED_PATCH | TRANSDERMAL | 0 refills | Status: DC

## 2019-01-10 MED ORDER — LEUPROLIDE ACETATE (3 MONTH) 22.5 MG ~~LOC~~ KIT
22.5000 mg | PACK | Freq: Once | SUBCUTANEOUS | Status: AC
  Administered 2019-01-10: 22.5 mg via SUBCUTANEOUS
  Filled 2019-01-10: qty 22.5

## 2019-01-10 NOTE — Patient Instructions (Signed)
Leuprolide injection What is this medicine? LEUPROLIDE (loo PROE lide) is a man-made hormone. It is used to treat the symptoms of prostate cancer. This medicine may also be used to treat children with early onset of puberty. It may be used for other hormonal conditions. This medicine may be used for other purposes; ask your health care provider or pharmacist if you have questions. COMMON BRAND NAME(S): Lupron What should I tell my health care provider before I take this medicine? They need to know if you have any of these conditions:  diabetes  heart disease or previous heart attack  high blood pressure  high cholesterol  pain or difficulty passing urine  spinal cord metastasis  stroke  tobacco smoker  an unusual or allergic reaction to leuprolide, benzyl alcohol, other medicines, foods, dyes, or preservatives  pregnant or trying to get pregnant  breast-feeding How should I use this medicine? This medicine is for injection under the skin or into a muscle. You will be taught how to prepare and give this medicine. Use exactly as directed. Take your medicine at regular intervals. Do not take your medicine more often than directed. It is important that you put your used needles and syringes in a special sharps container. Do not put them in a trash can. If you do not have a sharps container, call your pharmacist or healthcare provider to get one. A special MedGuide will be given to you by the pharmacist with each prescription and refill. Be sure to read this information carefully each time. Talk to your pediatrician regarding the use of this medicine in children. While this medicine may be prescribed for children as young as 8 years for selected conditions, precautions do apply. Overdosage: If you think you have taken too much of this medicine contact a poison control center or emergency room at once. NOTE: This medicine is only for you. Do not share this medicine with others. What if  I miss a dose? If you miss a dose, take it as soon as you can. If it is almost time for your next dose, take only that dose. Do not take double or extra doses. What may interact with this medicine? Do not take this medicine with any of the following medications:  chasteberry This medicine may also interact with the following medications:  herbal or dietary supplements, like black cohosh or DHEA  male hormones, like estrogens or progestins and birth control pills, patches, rings, or injections  male hormones, like testosterone This list may not describe all possible interactions. Give your health care provider a list of all the medicines, herbs, non-prescription drugs, or dietary supplements you use. Also tell them if you smoke, drink alcohol, or use illegal drugs. Some items may interact with your medicine. What should I watch for while using this medicine? Visit your doctor or health care professional for regular checks on your progress. During the first week, your symptoms may get worse, but then will improve as you continue your treatment. You may get hot flashes, increased bone pain, increased difficulty passing urine, or an aggravation of nerve symptoms. Discuss these effects with your doctor or health care professional, some of them may improve with continued use of this medicine. Male patients may experience a menstrual cycle or spotting during the first 2 months of therapy with this medicine. If this continues, contact your doctor or health care professional. This medicine may increase blood sugar. Ask your healthcare provider if changes in diet or medicines are needed if   you have diabetes. What side effects may I notice from receiving this medicine? Side effects that you should report to your doctor or health care professional as soon as possible:  allergic reactions like skin rash, itching or hives, swelling of the face, lips, or tongue  breathing problems  chest  pain  depression or memory disorders  pain in your legs or groin  pain at site where injected  severe headache  signs and symptoms of high blood sugar such as being more thirsty or hungry or having to urinate more than normal. You may also feel very tired or have blurry vision  swelling of the feet and legs  visual changes  vomiting Side effects that usually do not require medical attention (report to your doctor or health care professional if they continue or are bothersome):  breast swelling or tenderness  decrease in sex drive or performance  diarrhea  hot flashes  loss of appetite  muscle, joint, or bone pains  nausea  redness or irritation at site where injected  skin problems or acne This list may not describe all possible side effects. Call your doctor for medical advice about side effects. You may report side effects to FDA at 1-800-FDA-1088. Where should I keep my medicine? Keep out of the reach of children. Store below 25 degrees C (77 degrees F). Do not freeze. Protect from light. Do not use if it is not clear or if there are particles present. Throw away any unused medicine after the expiration date. NOTE: This sheet is a summary. It may not cover all possible information. If you have questions about this medicine, talk to your doctor, pharmacist, or health care provider.  2020 Elsevier/Gold Standard (2017-11-12 09:52:48) Zoledronic Acid injection (Hypercalcemia, Oncology) What is this medicine? ZOLEDRONIC ACID (ZOE le dron ik AS id) lowers the amount of calcium loss from bone. It is used to treat too much calcium in your blood from cancer. It is also used to prevent complications of cancer that has spread to the bone. This medicine may be used for other purposes; ask your health care provider or pharmacist if you have questions. COMMON BRAND NAME(S): Zometa What should I tell my health care provider before I take this medicine? They need to know if you  have any of these conditions:  aspirin-sensitive asthma  cancer, especially if you are receiving medicines used to treat cancer  dental disease or wear dentures  infection  kidney disease  receiving corticosteroids like dexamethasone or prednisone  an unusual or allergic reaction to zoledronic acid, other medicines, foods, dyes, or preservatives  pregnant or trying to get pregnant  breast-feeding How should I use this medicine? This medicine is for infusion into a vein. It is given by a health care professional in a hospital or clinic setting. Talk to your pediatrician regarding the use of this medicine in children. Special care may be needed. Overdosage: If you think you have taken too much of this medicine contact a poison control center or emergency room at once. NOTE: This medicine is only for you. Do not share this medicine with others. What if I miss a dose? It is important not to miss your dose. Call your doctor or health care professional if you are unable to keep an appointment. What may interact with this medicine?  certain antibiotics given by injection  NSAIDs, medicines for pain and inflammation, like ibuprofen or naproxen  some diuretics like bumetanide, furosemide  teriparatide  thalidomide This list may not   describe all possible interactions. Give your health care provider a list of all the medicines, herbs, non-prescription drugs, or dietary supplements you use. Also tell them if you smoke, drink alcohol, or use illegal drugs. Some items may interact with your medicine. What should I watch for while using this medicine? Visit your doctor or health care professional for regular checkups. It may be some time before you see the benefit from this medicine. Do not stop taking your medicine unless your doctor tells you to. Your doctor may order blood tests or other tests to see how you are doing. Women should inform their doctor if they wish to become pregnant or  think they might be pregnant. There is a potential for serious side effects to an unborn child. Talk to your health care professional or pharmacist for more information. You should make sure that you get enough calcium and vitamin D while you are taking this medicine. Discuss the foods you eat and the vitamins you take with your health care professional. Some people who take this medicine have severe bone, joint, and/or muscle pain. This medicine may also increase your risk for jaw problems or a broken thigh bone. Tell your doctor right away if you have severe pain in your jaw, bones, joints, or muscles. Tell your doctor if you have any pain that does not go away or that gets worse. Tell your dentist and dental surgeon that you are taking this medicine. You should not have major dental surgery while on this medicine. See your dentist to have a dental exam and fix any dental problems before starting this medicine. Take good care of your teeth while on this medicine. Make sure you see your dentist for regular follow-up appointments. What side effects may I notice from receiving this medicine? Side effects that you should report to your doctor or health care professional as soon as possible:  allergic reactions like skin rash, itching or hives, swelling of the face, lips, or tongue  anxiety, confusion, or depression  breathing problems  changes in vision  eye pain  feeling faint or lightheaded, falls  jaw pain, especially after dental work  mouth sores  muscle cramps, stiffness, or weakness  redness, blistering, peeling or loosening of the skin, including inside the mouth  trouble passing urine or change in the amount of urine Side effects that usually do not require medical attention (report to your doctor or health care professional if they continue or are bothersome):  bone, joint, or muscle pain  constipation  diarrhea  fever  hair loss  irritation at site where  injected  loss of appetite  nausea, vomiting  stomach upset  trouble sleeping  trouble swallowing  weak or tired This list may not describe all possible side effects. Call your doctor for medical advice about side effects. You may report side effects to FDA at 1-800-FDA-1088. Where should I keep my medicine? This drug is given in a hospital or clinic and will not be stored at home. NOTE: This sheet is a summary. It may not cover all possible information. If you have questions about this medicine, talk to your doctor, pharmacist, or health care provider.  2020 Elsevier/Gold Standard (2013-06-11 14:19:39)  

## 2019-01-10 NOTE — Progress Notes (Signed)
Hematology and Oncology Follow Up Visit  LISTER BRIZZI [id number] [birth year] 58 y.o. 01/10/2019   Principle Diagnosis:  Metastatic prostate cancer - androgen sensitive  Past Therapy: 14 fractions of radiation to the lumbar spine Casodex 50 mg by mouth daily- stopped 04/02/2017 due to progression Zytiga 1000 mg by mouth daily/prednisone 10 mg by mouth daily - started on 04/20/2016- d/c on 04/02/2017 Xtandi160 mg po q day - startedon 04/06/2017-- d/c on 06/24/2017 Flutamide 250 mg po q8hr - started06/12/2017-- d/c on 04/23/18  Current Therapy:   Lupron 11.5 mg IM every 3 month- next doseMarch2021  Zometa 4 mg IV q3 months- next doseMarch2021 Apalutamide 240 mg po q day -- startedon 04/28/2018   Interim History:  [name] is here today for follow-up and treatment. He is doing well and his pain is managed effectively at this time.  He is still tolerating the Erleada nicely.  October testosterone was 13 and PSA 0.4.  He denies any issues with infection. No fever, chills, n/v, cough, rash, dizziness, SOB, chest pain, palpitations, abdominal pain or changes in bowel or bladder habits.  No swelling in his extremities.  The neuropathy in his hands and feet is unchanged.  No falls or syncopal episodes to report.  He ambulates with a cane for added support.  No episodes of bleeding, no bruising or petechiae.  He states that he has a good appetite and is staying well hydrated. His weight is stable.   ECOG Performance Status: 1 - Symptomatic but completely ambulatory  Medications:  Allergies as of 01/10/2019      Reactions   Contrast Media [iodinated Diagnostic Agents] Nausea And Vomiting   Patient vomits immediately after IV contrast is injected just prior to start of scan, was unable to scan patient in correct time frame.  Patient did not warn me of this when dosing the patient with oral CM and asking the patient history questions until the patient was on the table for  the CT scan just prior to injection.  Ct Tech:  Karl Bales 07/29/17    Other Nausea And Vomiting   Patient vomits immediately after IV contrast is injected just prior to start of scan, was unable to scan patient in correct time frame.  Patient did not warn me of this when dosing the patient with oral CM and asking the patient history questions until the patient was on the table for the CT scan just prior to injection.  Ct Tech:  Karl Bales 07/29/17       Medication List       Accurate as of January 10, 2019 10:28 AM. If you have any questions, ask your nurse or doctor.        acetaminophen 500 MG tablet Commonly known as: TYLENOL Take by mouth.   CALCIUM-VITAMIN D3 PO Take 1 tablet by mouth daily.   diltiazem 240 MG 24 hr capsule Commonly known as: CARDIZEM CD TAKE 1 CAPSULE BY MOUTH EVERY DAY   dronabinol 5 MG capsule Commonly known as: MARINOL Take 1 capsule (5 mg total) by mouth 2 (two) times daily before a meal.   Erleada 60 MG tablet Generic drug: apalutamide TAKE 4 TABLETS (240 MG TOTAL) BY MOUTH DAILY. MAY BE TAKEN WITH OR WITHOUT FOOD. SWALLOW TABLETS WHOLE.   fentaNYL 100 MCG/HR Commonly known as: DURAGESIC Place 2 patches onto the skin every 3 (three) days.   gabapentin 300 MG capsule Commonly known as: NEURONTIN Take 1 capsule (300 mg total) by mouth 3 (three)  times daily.   megestrol 625 MG/5ML suspension Commonly known as: MEGACE ES Take 5 mLs (625 mg total) by mouth 2 (two) times daily.   metoprolol tartrate 25 MG tablet Commonly known as: LOPRESSOR TAKE 1/2 TABLET(12.5 MG) BY MOUTH TWICE DAILY   Narcan 4 MG/0.1ML Liqd nasal spray kit Generic drug: naloxone NAR REP ALN   ondansetron 4 MG disintegrating tablet Commonly known as: Zofran ODT Take 1 tablet (4 mg total) by mouth every 8 (eight) hours as needed for nausea or vomiting.   ondansetron 4 MG tablet Commonly known as: ZOFRAN TAKE 1 TABLET (4 MG TOTAL) BY MOUTH EVERY 6 (SIX) HOURS AS  NEEDED FOR NAUSEA OR VOMITING.   oxyCODONE-acetaminophen 10-325 MG tablet Commonly known as: Percocet Take 1 tablet by mouth every 4 (four) hours as needed for pain.   tamsulosin 0.4 MG Caps capsule Commonly known as: FLOMAX TAKE 1 CAPSULE BY MOUTH EVERY DAY       Allergies:  Allergies  Allergen Reactions  . Contrast Media [Iodinated Diagnostic Agents] Nausea And Vomiting    Patient vomits immediately after IV contrast is injected just prior to start of scan, was unable to scan patient in correct time frame.  Patient did not warn me of this when dosing the patient with oral CM and asking the patient history questions until the patient was on the table for the CT scan just prior to injection.  Ct Tech:  Karl Bales 07/29/17   . Other Nausea And Vomiting    Patient vomits immediately after IV contrast is injected just prior to start of scan, was unable to scan patient in correct time frame.  Patient did not warn me of this when dosing the patient with oral CM and asking the patient history questions until the patient was on the table for the CT scan just prior to injection.  Ct Tech:  Karl Bales 07/29/17     Past Medical History, Surgical history, Social history, and Family History were reviewed and updated.  Review of Systems: All other 10 point review of systems is negative.   Physical Exam:  vitals were not taken for this visit.   Wt Readings from Last 3 Encounters:  11/09/18 164 lb (74.4 kg)  11/08/18 170 lb (77.1 kg)  10/12/18 175 lb (79.4 kg)    Ocular: Sclerae unicteric, pupils equal, round and reactive to light Ear-nose-throat: Oropharynx clear, dentition fair Lymphatic: No cervical or supraclavicular adenopathy Lungs no rales or rhonchi, good excursion bilaterally Heart regular rate and rhythm, no murmur appreciated Abd soft, nontender, positive bowel sounds, no liver or spleen tip palpated on exam, no fluid wave  MSK no focal spinal tenderness, no joint  edema Neuro: non-focal, well-oriented, appropriate affect Breasts: Deferred   Lab Results  Component Value Date   WBC 11.9 (H) 01/10/2019   HGB 13.4 01/10/2019   HCT 40.6 01/10/2019   MCV 91.9 01/10/2019   PLT 136 (L) 01/10/2019   Lab Results  Component Value Date   FERRITIN 216 11/04/2017   IRON 77 11/04/2017   TIBC 261 11/04/2017   UIBC 184 11/04/2017   IRONPCTSAT 30 (L) 11/04/2017   Lab Results  Component Value Date   RBC 4.42 01/10/2019   Lab Results  Component Value Date   KAPLAMBRATIO 0.89 03/28/2016   Lab Results  Component Value Date   IGGSERUM 1,066 03/28/2016   IGMSERUM 106 03/28/2016   No results found for: TOTALPROTELP, ALBUMINELP, A1GS, A2GS, BETS, BETA2SER, [place], [place], SPEI   Chemistry  Component Value Date/Time   NA 139 11/09/2018 1034   NA 143 12/16/2016 1022   K 3.4 (L) 11/09/2018 1034   K 3.5 12/16/2016 1022   CL 100 11/09/2018 1034   CL 101 12/16/2016 1022   CO2 28 11/09/2018 1034   CO2 33 12/16/2016 1022   BUN 12 11/09/2018 1034   BUN 4 (L) 12/16/2016 1022   CREATININE 1.39 (H) 11/09/2018 1034   CREATININE 0.8 12/16/2016 1022      Component Value Date/Time   CALCIUM 9.0 11/09/2018 1034   CALCIUM 9.4 12/16/2016 1022   ALKPHOS 69 11/09/2018 1034   ALKPHOS 77 12/16/2016 1022   AST 16 11/09/2018 1034   ALT 11 11/09/2018 1034   ALT 16 12/16/2016 1022   BILITOT 0.4 11/09/2018 1034       Impression and Plan: Mr. Gildersleeve is avery pleasant 58 yo African American gentleman with metastatic prostate cancer. He is doing well and has no complaints at this time.  He did get Zometa and Lurpon today as planned.  Fentanyl script refilled.  We will plan to see him back in another 3 months.  He will contact our office with any questions or concerns. We can certainly see him sooner if needed.   Laverna Peace, NP 12/14/202010:28 AM

## 2019-01-11 LAB — TESTOSTERONE: Testosterone: 4 ng/dL — ABNORMAL LOW (ref 264–916)

## 2019-01-11 LAB — PSA, TOTAL AND FREE
PSA, Free Pct: 16.7 %
PSA, Free: 0.05 ng/mL
Prostate Specific Ag, Serum: 0.3 ng/mL (ref 0.0–4.0)

## 2019-01-14 ENCOUNTER — Other Ambulatory Visit: Payer: Self-pay | Admitting: *Deleted

## 2019-01-14 DIAGNOSIS — C61 Malignant neoplasm of prostate: Secondary | ICD-10-CM

## 2019-01-14 DIAGNOSIS — C7951 Secondary malignant neoplasm of bone: Secondary | ICD-10-CM

## 2019-01-14 DIAGNOSIS — I4891 Unspecified atrial fibrillation: Secondary | ICD-10-CM

## 2019-01-14 MED ORDER — METOPROLOL TARTRATE 25 MG PO TABS: 12.5000 mg | ORAL_TABLET | Freq: Two times a day (BID) | ORAL | 3 refills | Status: DC

## 2019-01-17 MED FILL — ERLEADA 60 MG TABS: 60 | 30 days supply | Qty: 120 | Fill #2

## 2019-02-04 ENCOUNTER — Other Ambulatory Visit: Payer: Self-pay

## 2019-02-04 DIAGNOSIS — R52 Pain, unspecified: Secondary | ICD-10-CM

## 2019-02-04 DIAGNOSIS — C61 Malignant neoplasm of prostate: Secondary | ICD-10-CM

## 2019-02-04 MED ORDER — OXYCODONE-ACETAMINOPHEN 10-325 MG PO TABS: 1.0000 | ORAL_TABLET | ORAL | 0 refills | Status: DC | PRN

## 2019-02-11 ENCOUNTER — Other Ambulatory Visit: Payer: Self-pay | Admitting: *Deleted

## 2019-02-11 DIAGNOSIS — C61 Malignant neoplasm of prostate: Secondary | ICD-10-CM

## 2019-02-11 DIAGNOSIS — G8929 Other chronic pain: Secondary | ICD-10-CM

## 2019-02-11 DIAGNOSIS — C7951 Secondary malignant neoplasm of bone: Secondary | ICD-10-CM

## 2019-02-11 DIAGNOSIS — M5442 Lumbago with sciatica, left side: Secondary | ICD-10-CM

## 2019-02-11 MED ORDER — FENTANYL 100 MCG/HR TD PT72: 2.0000 | MEDICATED_PATCH | TRANSDERMAL | 0 refills | Status: DC

## 2019-02-15 MED FILL — ERLEADA 60 MG TABS: 60 | 30 days supply | Qty: 120 | Fill #3

## 2019-03-09 ENCOUNTER — Other Ambulatory Visit: Payer: Self-pay | Admitting: *Deleted

## 2019-03-09 DIAGNOSIS — R52 Pain, unspecified: Secondary | ICD-10-CM

## 2019-03-09 DIAGNOSIS — M5442 Lumbago with sciatica, left side: Secondary | ICD-10-CM

## 2019-03-09 DIAGNOSIS — C61 Malignant neoplasm of prostate: Secondary | ICD-10-CM

## 2019-03-09 DIAGNOSIS — G8929 Other chronic pain: Secondary | ICD-10-CM

## 2019-03-09 MED ORDER — OXYCODONE-ACETAMINOPHEN 10-325 MG PO TABS: 1.0000 | ORAL_TABLET | ORAL | 0 refills | Status: DC | PRN

## 2019-03-09 MED ORDER — FENTANYL 100 MCG/HR TD PT72: 2.0000 | MEDICATED_PATCH | TRANSDERMAL | 0 refills | Status: DC

## 2019-03-17 ENCOUNTER — Other Ambulatory Visit: Payer: Self-pay | Admitting: Family

## 2019-03-17 DIAGNOSIS — R634 Abnormal weight loss: Secondary | ICD-10-CM

## 2019-03-17 DIAGNOSIS — C61 Malignant neoplasm of prostate: Secondary | ICD-10-CM

## 2019-03-17 DIAGNOSIS — R630 Anorexia: Secondary | ICD-10-CM

## 2019-03-17 MED FILL — ERLEADA 60 MG TABS: 60 | 30 days supply | Qty: 120 | Fill #4

## 2019-04-05 ENCOUNTER — Other Ambulatory Visit: Payer: Self-pay | Admitting: *Deleted

## 2019-04-05 DIAGNOSIS — G8929 Other chronic pain: Secondary | ICD-10-CM

## 2019-04-05 DIAGNOSIS — C7951 Secondary malignant neoplasm of bone: Secondary | ICD-10-CM

## 2019-04-05 DIAGNOSIS — M5441 Lumbago with sciatica, right side: Secondary | ICD-10-CM

## 2019-04-05 DIAGNOSIS — R52 Pain, unspecified: Secondary | ICD-10-CM

## 2019-04-05 DIAGNOSIS — C61 Malignant neoplasm of prostate: Secondary | ICD-10-CM

## 2019-04-05 MED ORDER — OXYCODONE-ACETAMINOPHEN 10-325 MG PO TABS: 1.0000 | ORAL_TABLET | ORAL | 0 refills | Status: DC | PRN

## 2019-04-05 MED ORDER — FENTANYL 100 MCG/HR TD PT72: 2.0000 | MEDICATED_PATCH | TRANSDERMAL | 0 refills | Status: DC

## 2019-04-11 ENCOUNTER — Inpatient Hospital Stay: Payer: Medicare HMO

## 2019-04-11 ENCOUNTER — Inpatient Hospital Stay (HOSPITAL_BASED_OUTPATIENT_CLINIC_OR_DEPARTMENT_OTHER): Payer: Medicare HMO | Admitting: Hematology & Oncology

## 2019-04-11 ENCOUNTER — Encounter: Payer: Self-pay | Admitting: Hematology & Oncology

## 2019-04-11 ENCOUNTER — Inpatient Hospital Stay: Payer: Medicare HMO | Attending: Hematology & Oncology

## 2019-04-11 ENCOUNTER — Other Ambulatory Visit: Payer: Self-pay

## 2019-04-11 VITALS — BP 120/80 | HR 86 | Temp 97.3°F | Resp 19 | Wt 171.0 lb

## 2019-04-11 DIAGNOSIS — G8929 Other chronic pain: Secondary | ICD-10-CM

## 2019-04-11 DIAGNOSIS — C7951 Secondary malignant neoplasm of bone: Secondary | ICD-10-CM | POA: Insufficient documentation

## 2019-04-11 DIAGNOSIS — C61 Malignant neoplasm of prostate: Secondary | ICD-10-CM

## 2019-04-11 DIAGNOSIS — M5441 Lumbago with sciatica, right side: Secondary | ICD-10-CM

## 2019-04-11 DIAGNOSIS — M5442 Lumbago with sciatica, left side: Secondary | ICD-10-CM

## 2019-04-11 DIAGNOSIS — Z5111 Encounter for antineoplastic chemotherapy: Secondary | ICD-10-CM | POA: Diagnosis not present

## 2019-04-11 LAB — CMP (CANCER CENTER ONLY)
ALT: 9 U/L (ref 0–44)
AST: 9 U/L — ABNORMAL LOW (ref 15–41)
Albumin: 4.6 g/dL (ref 3.5–5.0)
Alkaline Phosphatase: 70 U/L (ref 38–126)
Anion gap: 10 (ref 5–15)
BUN: 8 mg/dL (ref 6–20)
CO2: 26 mmol/L (ref 22–32)
Calcium: 9.4 mg/dL (ref 8.9–10.3)
Chloride: 105 mmol/L (ref 98–111)
Creatinine: 1.05 mg/dL (ref 0.61–1.24)
GFR, Est AFR Am: >60 mL/min (ref >60)
GFR, Estimated: >60 mL/min (ref >60)
Glucose, Bld: 121 mg/dL — ABNORMAL HIGH (ref 70–99)
Potassium: 3.8 mmol/L (ref 3.5–5.1)
Sodium: 141 mmol/L (ref 135–145)
Total Bilirubin: 0.3 mg/dL (ref 0.3–1.2)
Total Protein: 7.3 g/dL (ref 6.5–8.1)

## 2019-04-11 LAB — CBC WITH DIFFERENTIAL (CANCER CENTER ONLY)
Abs Immature Granulocytes: 0.03 10*3/uL (ref 0.00–0.07)
Basophils Absolute: 0.1 10*3/uL (ref 0.0–0.1)
Basophils Relative: 0 %
Eosinophils Absolute: 0.3 10*3/uL (ref 0.0–0.5)
Eosinophils Relative: 3 %
HCT: 44.6 % (ref 39.0–52.0)
Hemoglobin: 14.6 g/dL (ref 13.0–17.0)
Immature Granulocytes: 0 %
Lymphocytes Relative: 19 %
Lymphs Abs: 2.3 10*3/uL (ref 0.7–4.0)
MCH: 30 pg (ref 26.0–34.0)
MCHC: 32.7 g/dL (ref 30.0–36.0)
MCV: 91.8 fL (ref 80.0–100.0)
Monocytes Absolute: 0.7 10*3/uL (ref 0.1–1.0)
Monocytes Relative: 6 %
Neutro Abs: 8.8 10*3/uL — ABNORMAL HIGH (ref 1.7–7.7)
Neutrophils Relative %: 72 %
Platelet Count: 212 10*3/uL (ref 150–400)
RBC: 4.86 MIL/uL (ref 4.22–5.81)
RDW: 14.1 % (ref 11.5–15.5)
WBC Count: 12.1 10*3/uL — ABNORMAL HIGH (ref 4.0–10.5)
nRBC: 0 % (ref 0.0–0.2)

## 2019-04-11 MED ORDER — LEUPROLIDE ACETATE (3 MONTH) 22.5 MG ~~LOC~~ KIT
22.5000 mg | PACK | Freq: Once | SUBCUTANEOUS | Status: AC
  Administered 2019-04-11: 22.5 mg via SUBCUTANEOUS
  Filled 2019-04-11: qty 22.5

## 2019-04-11 MED ORDER — ZOLEDRONIC ACID 4 MG/100ML IV SOLN
4.0000 mg | Freq: Once | INTRAVENOUS | Status: AC
  Administered 2019-04-11: 4 mg via INTRAVENOUS
  Filled 2019-04-11: qty 100

## 2019-04-11 MED ORDER — SODIUM CHLORIDE 0.9 % IV SOLN
Freq: Once | INTRAVENOUS | Status: DC
  Filled 2019-04-11: qty 250

## 2019-04-11 NOTE — Progress Notes (Signed)
Hematology and Oncology Follow Up Visit  Ryan Davis [id number] 05/08/1960 59 y.o. 04/11/2019   Principle Diagnosis:  Metastatic prostate cancer - androgen sensitive  Past Therapy: 14 fractions of radiation to the lumbar spine Casodex 50 mg by mouth daily- stopped 04/02/2017 due to progression Zytiga 1000 mg by mouth daily/prednisone 10 mg by mouth daily - started on 04/20/2016- d/c on 04/02/2017 Xtandi160 mg po q day - startedon 04/06/2017-- d/c on 06/24/2017 Flutamide 250 mg po q8hr - started06/12/2017-- d/c on 04/23/18  Current Therapy:   Lupron 11.5 mg IM every 3 month- next dose06/2021  Zometa 4 mg IV q3 months- next dose06/2021 Apalutamide 240 mg po q day -- startedon 04/28/2018   Interim History:  Ryan Davis is here today for follow-up and treatment.  I must say, but he does look quite good.  It is been amazing how well the apalutamide has helped.  His PSA we saw him back in December was 0.05.  He really has done nicely.  He really has had no specific complaints.  He does have occasional flareups of pain but he is on a good regimen of pain medication to help with this.  His wife has had her problems.  He spending a lot of time taking care of her.  He has had no problems with bowels or bladder.  He has had no fever.  He has had no cough.  I told him that he can have the COVID-19 vaccine.  I would think that given that he has cancer, that he would be in a category that he should be able to get this.  He has had no bleeding.  Overall, his performance status is ECOG 1.  Medications:  Allergies as of 04/11/2019      Reactions   Contrast Media [iodinated Diagnostic Agents] Nausea And Vomiting   Patient vomits immediately after IV contrast is injected just prior to start of scan, was unable to scan patient in correct time frame.  Patient did not warn me of this when dosing the patient with oral CM and asking the patient history questions until the patient was on  the table for the CT scan just prior to injection.  Ct Tech:  Karl Bales 07/29/17    Other Nausea And Vomiting   Patient vomits immediately after IV contrast is injected just prior to start of scan, was unable to scan patient in correct time frame.  Patient did not warn me of this when dosing the patient with oral CM and asking the patient history questions until the patient was on the table for the CT scan just prior to injection.  Ct Tech:  Karl Bales 07/29/17       Medication List       Accurate as of April 11, 2019 12:49 PM. If you have any questions, ask your nurse or doctor.        acetaminophen 500 MG tablet Commonly known as: TYLENOL Take by mouth.   CALCIUM-VITAMIN D3 PO Take 1 tablet by mouth daily.   diltiazem 240 MG 24 hr capsule Commonly known as: CARDIZEM CD TAKE 1 CAPSULE BY MOUTH EVERY DAY   dronabinol 5 MG capsule Commonly known as: MARINOL TAKE 1 CAPSULE(5 MG) BY MOUTH TWICE DAILY BEFORE A MEAL   Erleada 60 MG tablet Generic drug: apalutamide TAKE 4 TABLETS (240 MG TOTAL) BY MOUTH DAILY. MAY BE TAKEN WITH OR WITHOUT FOOD. SWALLOW TABLETS WHOLE.   fentaNYL 100 MCG/HR Commonly known as: DURAGESIC Place 2 patches  onto the skin every 3 (three) days.   gabapentin 300 MG capsule Commonly known as: NEURONTIN Take 1 capsule (300 mg total) by mouth 3 (three) times daily.   megestrol 625 MG/5ML suspension Commonly known as: MEGACE ES Take 5 mLs (625 mg total) by mouth 2 (two) times daily.   metoprolol tartrate 25 MG tablet Commonly known as: LOPRESSOR Take 0.5 tablets (12.5 mg total) by mouth 2 (two) times daily.   Narcan 4 MG/0.1ML Liqd nasal spray kit Generic drug: naloxone NAR REP ALN   ondansetron 4 MG disintegrating tablet Commonly known as: Zofran ODT Take 1 tablet (4 mg total) by mouth every 8 (eight) hours as needed for nausea or vomiting.   ondansetron 4 MG tablet Commonly known as: ZOFRAN TAKE 1 TABLET (4 MG TOTAL) BY MOUTH EVERY 6 (SIX)  HOURS AS NEEDED FOR NAUSEA OR VOMITING.   oxyCODONE-acetaminophen 10-325 MG tablet Commonly known as: Percocet Take 1 tablet by mouth every 4 (four) hours as needed for pain.   tamsulosin 0.4 MG Caps capsule Commonly known as: FLOMAX TAKE 1 CAPSULE BY MOUTH EVERY DAY       Allergies:  Allergies  Allergen Reactions  . Contrast Media [Iodinated Diagnostic Agents] Nausea And Vomiting    Patient vomits immediately after IV contrast is injected just prior to start of scan, was unable to scan patient in correct time frame.  Patient did not warn me of this when dosing the patient with oral CM and asking the patient history questions until the patient was on the table for the CT scan just prior to injection.  Ct Tech:  Karl Bales 07/29/17   . Other Nausea And Vomiting    Patient vomits immediately after IV contrast is injected just prior to start of scan, was unable to scan patient in correct time frame.  Patient did not warn me of this when dosing the patient with oral CM and asking the patient history questions until the patient was on the table for the CT scan just prior to injection.  Ct Tech:  Karl Bales 07/29/17     Past Medical History, Surgical history, Social history, and Family History were reviewed and updated.  Review of Systems: Review of Systems  Constitutional: Negative.   HENT: Negative.   Eyes: Negative.   Respiratory: Negative.   Cardiovascular: Negative.   Gastrointestinal: Negative.   Genitourinary: Negative.   Musculoskeletal: Negative.   Skin: Negative.   Neurological: Negative.   Endo/Heme/Allergies: Negative.   Psychiatric/Behavioral: Negative.      Physical Exam:  weight is 171 lb (77.6 kg). His temporal temperature is 97.3 F (36.3 C) (abnormal). His blood pressure is 120/80 and his pulse is 86. His respiration is 19 and oxygen saturation is 100%.   Wt Readings from Last 3 Encounters:  04/11/19 171 lb (77.6 kg)  01/10/19 171 lb 12.8 oz (77.9 kg)   11/09/18 164 lb (74.4 kg)    Physical Exam Vitals reviewed.  HENT:     Head: Normocephalic and atraumatic.  Eyes:     Pupils: Pupils are equal, round, and reactive to light.  Cardiovascular:     Rate and Rhythm: Normal rate and regular rhythm.     Heart sounds: Normal heart sounds.  Pulmonary:     Effort: Pulmonary effort is normal.     Breath sounds: Normal breath sounds.  Abdominal:     General: Bowel sounds are normal.     Palpations: Abdomen is soft.  Musculoskeletal:  General: No tenderness or deformity. Normal range of motion.     Cervical back: Normal range of motion.  Lymphadenopathy:     Cervical: No cervical adenopathy.  Skin:    General: Skin is warm and dry.     Findings: No erythema or rash.  Neurological:     Mental Status: He is alert and oriented to person, place, and time.  Psychiatric:        Behavior: Behavior normal.        Thought Content: Thought content normal.        Judgment: Judgment normal.      Lab Results  Component Value Date   WBC 12.1 (H) 04/11/2019   HGB 14.6 04/11/2019   HCT 44.6 04/11/2019   MCV 91.8 04/11/2019   PLT 212 04/11/2019   Lab Results  Component Value Date   FERRITIN 216 11/04/2017   IRON 77 11/04/2017   TIBC 261 11/04/2017   UIBC 184 11/04/2017   IRONPCTSAT 30 (L) 11/04/2017   Lab Results  Component Value Date   RBC 4.86 04/11/2019   Lab Results  Component Value Date   KAPLAMBRATIO 0.89 03/28/2016   Lab Results  Component Value Date   IGGSERUM 1,066 03/28/2016   IGMSERUM 106 03/28/2016   No results found for: Odetta Pink, SPEI   Chemistry      Component Value Date/Time   NA 141 04/11/2019 1157   NA 143 12/16/2016 1022   K 3.8 04/11/2019 1157   K 3.5 12/16/2016 1022   CL 105 04/11/2019 1157   CL 101 12/16/2016 1022   CO2 26 04/11/2019 1157   CO2 33 12/16/2016 1022   BUN 8 04/11/2019 1157   BUN 4 (L) 12/16/2016 1022   CREATININE  1.05 04/11/2019 1157   CREATININE 0.8 12/16/2016 1022      Component Value Date/Time   CALCIUM 9.4 04/11/2019 1157   CALCIUM 9.4 12/16/2016 1022   ALKPHOS 70 04/11/2019 1157   ALKPHOS 77 12/16/2016 1022   AST 9 (L) 04/11/2019 1157   ALT 9 04/11/2019 1157   ALT 16 12/16/2016 1022   BILITOT 0.3 04/11/2019 1157       Impression and Plan: Ryan Davis is avery pleasant 59 yo African American gentleman with metastatic prostate cancer.  Again, he is done incredibly well.  I believe that there is still some sensitivity to androgen since he really has not been on chemotherapy.  We will continue to follow his PSA as this is always a good indicator.  He will get his Zometa and Lupron today.  We do this every 3 months.  He does keep his testosterone level down.  I am just glad that his quality of life is doing so well.  We will continue to pray for his poor wife.  We will have him back in 3 months.    [name], MD 3/15/202112:49 PM

## 2019-04-11 NOTE — Patient Instructions (Addendum)
Zoledronic Acid injection (Hypercalcemia, Oncology) What is this medicine? ZOLEDRONIC ACID (ZOE le dron ik AS id) lowers the amount of calcium loss from bone. It is used to treat too much calcium in your blood from cancer. It is also used to prevent complications of cancer that has spread to the bone. This medicine may be used for other purposes; ask your health care provider or pharmacist if you have questions. COMMON BRAND NAME(S): Zometa What should I tell my health care provider before I take this medicine? They need to know if you have any of these conditions:  aspirin-sensitive asthma  cancer, especially if you are receiving medicines used to treat cancer  dental disease or wear dentures  infection  kidney disease  receiving corticosteroids like dexamethasone or prednisone  an unusual or allergic reaction to zoledronic acid, other medicines, foods, dyes, or preservatives  pregnant or trying to get pregnant  breast-feeding How should I use this medicine? This medicine is for infusion into a vein. It is given by a health care professional in a hospital or clinic setting. Talk to your pediatrician regarding the use of this medicine in children. Special care may be needed. Overdosage: If you think you have taken too much of this medicine contact a poison control center or emergency room at once. NOTE: This medicine is only for you. Do not share this medicine with others. What if I miss a dose? It is important not to miss your dose. Call your doctor or health care professional if you are unable to keep an appointment. What may interact with this medicine?  certain antibiotics given by injection  NSAIDs, medicines for pain and inflammation, like ibuprofen or naproxen  some diuretics like bumetanide, furosemide  teriparatide  thalidomide This list may not describe all possible interactions. Give your health care provider a list of all the medicines, herbs, non-prescription  drugs, or dietary supplements you use. Also tell them if you smoke, drink alcohol, or use illegal drugs. Some items may interact with your medicine. What should I watch for while using this medicine? Visit your doctor or health care professional for regular checkups. It may be some time before you see the benefit from this medicine. Do not stop taking your medicine unless your doctor tells you to. Your doctor may order blood tests or other tests to see how you are doing. Women should inform their doctor if they wish to become pregnant or think they might be pregnant. There is a potential for serious side effects to an unborn child. Talk to your health care professional or pharmacist for more information. You should make sure that you get enough calcium and vitamin D while you are taking this medicine. Discuss the foods you eat and the vitamins you take with your health care professional. Some people who take this medicine have severe bone, joint, and/or muscle pain. This medicine may also increase your risk for jaw problems or a broken thigh bone. Tell your doctor right away if you have severe pain in your jaw, bones, joints, or muscles. Tell your doctor if you have any pain that does not go away or that gets worse. Tell your dentist and dental surgeon that you are taking this medicine. You should not have major dental surgery while on this medicine. See your dentist to have a dental exam and fix any dental problems before starting this medicine. Take good care of your teeth while on this medicine. Make sure you see your dentist for regular follow-up   appointments. What side effects may I notice from receiving this medicine? Side effects that you should report to your doctor or health care professional as soon as possible:  allergic reactions like skin rash, itching or hives, swelling of the face, lips, or tongue  anxiety, confusion, or depression  breathing problems  changes in vision  eye  pain  feeling faint or lightheaded, falls  jaw pain, especially after dental work  mouth sores  muscle cramps, stiffness, or weakness  redness, blistering, peeling or loosening of the skin, including inside the mouth  trouble passing urine or change in the amount of urine Side effects that usually do not require medical attention (report to your doctor or health care professional if they continue or are bothersome):  bone, joint, or muscle pain  constipation  diarrhea  fever  hair loss  irritation at site where injected  loss of appetite  nausea, vomiting  stomach upset  trouble sleeping  trouble swallowing  weak or tired This list may not describe all possible side effects. Call your doctor for medical advice about side effects. You may report side effects to FDA at 1-800-FDA-1088. Where should I keep my medicine? This drug is given in a hospital or clinic and will not be stored at home. NOTE: This sheet is a summary. It may not cover all possible information. If you have questions about this medicine, talk to your doctor, pharmacist, or health care provider.  2020 Elsevier/Gold Standard (2013-06-11 14:19:39) Leuprolide depot injection What is this medicine? LEUPROLIDE (loo PROE lide) is a man-made protein that acts like a natural hormone in the body. It decreases testosterone in men and decreases estrogen in women. In men, this medicine is used to treat advanced prostate cancer. In women, some forms of this medicine may be used to treat endometriosis, uterine fibroids, or other male hormone-related problems. This medicine may be used for other purposes; ask your health care provider or pharmacist if you have questions. COMMON BRAND NAME(S): Eligard, Fensolv, Lupron Depot, Lupron Depot-Ped, Viadur What should I tell my health care provider before I take this medicine? They need to know if you have any of these conditions: diabetes heart disease or previous heart  attack high blood pressure high cholesterol mental illness osteoporosis pain or difficulty passing urine seizures spinal cord metastasis stroke suicidal thoughts, plans, or attempt; a previous suicide attempt by you or a family member tobacco smoker unusual vaginal bleeding (women) an unusual or allergic reaction to leuprolide, benzyl alcohol, other medicines, foods, dyes, or preservatives pregnant or trying to get pregnant breast-feeding How should I use this medicine? This medicine is for injection into a muscle or for injection under the skin. It is given by a health care professional in a hospital or clinic setting. The specific product will determine how it will be given to you. Make sure you understand which product you receive and how often you will receive it. Talk to your pediatrician regarding the use of this medicine in children. Special care may be needed. Overdosage: If you think you have taken too much of this medicine contact a poison control center or emergency room at once. NOTE: This medicine is only for you. Do not share this medicine with others. What if I miss a dose? It is important not to miss a dose. Call your doctor or health care professional if you are unable to keep an appointment. Depot injections: Depot injections are given either once-monthly, every 12 weeks, every 16 weeks, or every   24 weeks depending on the product you are prescribed. The product you are prescribed will be based on if you are male or male, and your condition. Make sure you understand your product and dosing. What may interact with this medicine? Do not take this medicine with any of the following medications: chasteberry This medicine may also interact with the following medications: herbal or dietary supplements, like black cohosh or DHEA male hormones, like estrogens or progestins and birth control pills, patches, rings, or injections male hormones, like testosterone This list may  not describe all possible interactions. Give your health care provider a list of all the medicines, herbs, non-prescription drugs, or dietary supplements you use. Also tell them if you smoke, drink alcohol, or use illegal drugs. Some items may interact with your medicine. What should I watch for while using this medicine? Visit your doctor or health care professional for regular checks on your progress. During the first weeks of treatment, your symptoms may get worse, but then will improve as you continue your treatment. You may get hot flashes, increased bone pain, increased difficulty passing urine, or an aggravation of nerve symptoms. Discuss these effects with your doctor or health care professional, some of them may improve with continued use of this medicine. Male patients may experience a menstrual cycle or spotting during the first months of therapy with this medicine. If this continues, contact your doctor or health care professional. This medicine may increase blood sugar. Ask your healthcare provider if changes in diet or medicines are needed if you have diabetes. What side effects may I notice from receiving this medicine? Side effects that you should report to your doctor or health care professional as soon as possible: allergic reactions like skin rash, itching or hives, swelling of the face, lips, or tongue breathing problems chest pain depression or memory disorders pain in your legs or groin pain at site where injected or implanted seizures severe headache signs and symptoms of high blood sugar such as being more thirsty or hungry or having to urinate more than normal. You may also feel very tired or have blurry vision swelling of the feet and legs suicidal thoughts or other mood changes visual changes vomiting Side effects that usually do not require medical attention (report to your doctor or health care professional if they continue or are bothersome): breast swelling or  tenderness decrease in sex drive or performance diarrhea hot flashes loss of appetite muscle, joint, or bone pains nausea redness or irritation at site where injected or implanted skin problems or acne This list may not describe all possible side effects. Call your doctor for medical advice about side effects. You may report side effects to FDA at 1-800-FDA-1088. Where should I keep my medicine? This drug is given in a hospital or clinic and will not be stored at home. NOTE: This sheet is a summary. It may not cover all possible information. If you have questions about this medicine, talk to your doctor, pharmacist, or health care provider.  2020 Elsevier/Gold Standard (2017-11-12 09:27:03)  

## 2019-04-12 LAB — TESTOSTERONE: Testosterone: <3 ng/dL — ABNORMAL LOW (ref 264–916)

## 2019-04-12 LAB — PROSTATE-SPECIFIC AG, SERUM (LABCORP): Prostate Specific Ag, Serum: 0.5 ng/mL (ref 0.0–4.0)

## 2019-04-21 ENCOUNTER — Other Ambulatory Visit: Payer: Self-pay | Admitting: Hematology & Oncology

## 2019-04-25 MED FILL — ERLEADA 60 MG TABS: 60 | 30 days supply | Qty: 120 | Fill #5

## 2019-05-05 ENCOUNTER — Other Ambulatory Visit: Payer: Self-pay | Admitting: *Deleted

## 2019-05-05 DIAGNOSIS — R52 Pain, unspecified: Secondary | ICD-10-CM

## 2019-05-05 DIAGNOSIS — G8929 Other chronic pain: Secondary | ICD-10-CM

## 2019-05-05 DIAGNOSIS — C7951 Secondary malignant neoplasm of bone: Secondary | ICD-10-CM

## 2019-05-05 DIAGNOSIS — C61 Malignant neoplasm of prostate: Secondary | ICD-10-CM

## 2019-05-05 DIAGNOSIS — M5442 Lumbago with sciatica, left side: Secondary | ICD-10-CM

## 2019-05-05 MED ORDER — FENTANYL 100 MCG/HR TD PT72: 2.0000 | MEDICATED_PATCH | TRANSDERMAL | 0 refills | Status: DC

## 2019-05-05 MED ORDER — OXYCODONE-ACETAMINOPHEN 10-325 MG PO TABS: 1.0000 | ORAL_TABLET | ORAL | 0 refills | Status: DC | PRN

## 2019-05-19 MED FILL — ERLEADA 60 MG TABS: 60 | 30 days supply | Qty: 120 | Fill #6

## 2019-05-21 ENCOUNTER — Other Ambulatory Visit: Payer: Self-pay | Admitting: Family

## 2019-05-21 DIAGNOSIS — C61 Malignant neoplasm of prostate: Secondary | ICD-10-CM

## 2019-06-06 ENCOUNTER — Other Ambulatory Visit: Payer: Self-pay | Admitting: *Deleted

## 2019-06-06 DIAGNOSIS — R52 Pain, unspecified: Secondary | ICD-10-CM

## 2019-06-06 DIAGNOSIS — C61 Malignant neoplasm of prostate: Secondary | ICD-10-CM

## 2019-06-06 DIAGNOSIS — M5442 Lumbago with sciatica, left side: Secondary | ICD-10-CM

## 2019-06-06 DIAGNOSIS — G8929 Other chronic pain: Secondary | ICD-10-CM

## 2019-06-06 DIAGNOSIS — C7951 Secondary malignant neoplasm of bone: Secondary | ICD-10-CM

## 2019-06-06 MED ORDER — OXYCODONE-ACETAMINOPHEN 10-325 MG PO TABS: 1.0000 | ORAL_TABLET | ORAL | 0 refills | Status: DC | PRN

## 2019-06-06 MED ORDER — FENTANYL 100 MCG/HR TD PT72: 2.0000 | MEDICATED_PATCH | TRANSDERMAL | 0 refills | Status: DC

## 2019-06-14 ENCOUNTER — Other Ambulatory Visit: Payer: Self-pay | Admitting: Hematology & Oncology

## 2019-06-14 DIAGNOSIS — C7951 Secondary malignant neoplasm of bone: Secondary | ICD-10-CM

## 2019-06-14 DIAGNOSIS — C61 Malignant neoplasm of prostate: Secondary | ICD-10-CM

## 2019-06-23 MED FILL — ERLEADA 60 MG TABS: 60 | 30 days supply | Qty: 120 | Fill #0

## 2019-07-06 ENCOUNTER — Other Ambulatory Visit: Payer: Self-pay | Admitting: *Deleted

## 2019-07-06 DIAGNOSIS — G8929 Other chronic pain: Secondary | ICD-10-CM

## 2019-07-06 DIAGNOSIS — C61 Malignant neoplasm of prostate: Secondary | ICD-10-CM

## 2019-07-06 DIAGNOSIS — R52 Pain, unspecified: Secondary | ICD-10-CM

## 2019-07-06 MED ORDER — FENTANYL 100 MCG/HR TD PT72: 2.0000 | MEDICATED_PATCH | TRANSDERMAL | 0 refills | Status: DC

## 2019-07-06 MED ORDER — OXYCODONE-ACETAMINOPHEN 10-325 MG PO TABS: 1.0000 | ORAL_TABLET | ORAL | 0 refills | Status: DC | PRN

## 2019-07-11 ENCOUNTER — Inpatient Hospital Stay: Payer: Medicare Other

## 2019-07-11 ENCOUNTER — Other Ambulatory Visit: Payer: Self-pay

## 2019-07-11 ENCOUNTER — Encounter: Payer: Self-pay | Admitting: Hematology & Oncology

## 2019-07-11 ENCOUNTER — Inpatient Hospital Stay: Payer: Medicare Other | Attending: Hematology & Oncology | Admitting: Hematology & Oncology

## 2019-07-11 VITALS — BP 123/71 | HR 71 | Temp 97.5°F | Resp 18 | Wt 165.0 lb

## 2019-07-11 DIAGNOSIS — C7951 Secondary malignant neoplasm of bone: Secondary | ICD-10-CM | POA: Diagnosis present

## 2019-07-11 DIAGNOSIS — Z5111 Encounter for antineoplastic chemotherapy: Secondary | ICD-10-CM | POA: Diagnosis present

## 2019-07-11 DIAGNOSIS — C61 Malignant neoplasm of prostate: Secondary | ICD-10-CM | POA: Diagnosis present

## 2019-07-11 LAB — CBC WITH DIFFERENTIAL (CANCER CENTER ONLY)
Band Neutrophils: 0 %
Basophils Absolute: 0 10*3/uL (ref 0.0–0.1)
Basophils Relative: 0 %
Eosinophils Absolute: 0.4 10*3/uL (ref 0.0–0.5)
Eosinophils Relative: 1 %
HCT: 42.2 % (ref 39.0–52.0)
Hemoglobin: 14 g/dL (ref 13.0–17.0)
Lymphocytes Relative: 23 %
Lymphs Abs: 2.4 10*3/uL (ref 0.7–4.0)
MCH: 30.6 pg (ref 26.0–34.0)
MCHC: 33.2 g/dL (ref 30.0–36.0)
MCV: 92.3 fL (ref 80.0–100.0)
Monocytes Absolute: 0.7 10*3/uL (ref 0.1–1.0)
Monocytes Relative: 7 %
Neutro Abs: 6.8 10*3/uL (ref 1.7–7.7)
Neutrophils Relative %: 65 %
Platelet Count: 162 10*3/uL (ref 150–400)
RBC: 4.57 MIL/uL (ref 4.22–5.81)
RDW: 14.3 % (ref 11.5–15.5)
WBC Count: 10.5 10*3/uL (ref 4.0–10.5)
nRBC: 0 % (ref 0.0–0.2)

## 2019-07-11 LAB — CMP (CANCER CENTER ONLY)
ALT: 6 U/L (ref 0–44)
AST: 9 U/L — ABNORMAL LOW (ref 15–41)
Albumin: 4.4 g/dL (ref 3.5–5.0)
Alkaline Phosphatase: 64 U/L (ref 38–126)
Anion gap: 7 (ref 5–15)
BUN: 10 mg/dL (ref 6–20)
CO2: 30 mmol/L (ref 22–32)
Calcium: 9.6 mg/dL (ref 8.9–10.3)
Chloride: 102 mmol/L (ref 98–111)
Creatinine: 0.94 mg/dL (ref 0.61–1.24)
GFR, Est AFR Am: >60 mL/min (ref >60)
GFR, Estimated: >60 mL/min (ref >60)
Glucose, Bld: 122 mg/dL — ABNORMAL HIGH (ref 70–99)
Potassium: 4.1 mmol/L (ref 3.5–5.1)
Sodium: 139 mmol/L (ref 135–145)
Total Bilirubin: 0.3 mg/dL (ref 0.3–1.2)
Total Protein: 6.9 g/dL (ref 6.5–8.1)

## 2019-07-11 MED ORDER — ZOLEDRONIC ACID 4 MG/100ML IV SOLN
4.0000 mg | Freq: Once | INTRAVENOUS | Status: AC
  Administered 2019-07-11: 4 mg via INTRAVENOUS
  Filled 2019-07-11: qty 100

## 2019-07-11 MED ORDER — SODIUM CHLORIDE 0.9 % IV SOLN
Freq: Once | INTRAVENOUS | Status: AC
  Filled 2019-07-11: qty 250

## 2019-07-11 MED ORDER — LEUPROLIDE ACETATE (3 MONTH) 22.5 MG ~~LOC~~ KIT
22.5000 mg | PACK | Freq: Once | SUBCUTANEOUS | Status: AC
  Administered 2019-07-11: 22.5 mg via SUBCUTANEOUS
  Filled 2019-07-11: qty 22.5

## 2019-07-11 NOTE — Progress Notes (Signed)
Hematology and Oncology Follow Up Visit  Ryan Davis [id number] 09-Jan-1961 59 y.o. 07/11/2019   Principle Diagnosis:  Metastatic prostate cancer - androgen sensitive  Past Therapy: 14 fractions of radiation to the lumbar spine Casodex 50 mg by mouth daily- stopped 04/02/2017 due to progression Zytiga 1000 mg by mouth daily/prednisone 10 mg by mouth daily - started on 04/20/2016- d/c on 04/02/2017 Xtandi160 mg po q day - startedon 04/06/2017-- d/c on 06/24/2017 Flutamide 250 mg po q8hr - started06/12/2017-- d/c on 04/23/18  Current Therapy:   Lupron 11.5 mg IM every 3 month- next dose09/2021  Zometa 4 mg IV q3 months- next dose09/2021 Apalutamide 240 mg po q day -- startedon 04/28/2018   Interim History:  Ryan Davis is here today for follow-up and treatment.  He is still doing quite well.  He had a very nice Memorial Day weekend.  He is busy trying to take care of his poor wife.  She is in a wheelchair.  He said he did buy a Printmaker.  This will help get her around.  He wants to take her to [facility].  His prostate cancer is doing extremely well.  His last PSA 3 months ago 0.5.  His last testosterone was less than 3.  He has had no problems with the apalutamide.  He has not had diarrhea.  He has had no issues with increased back discomfort.  He is on oxycodone for this.  He has had no issues with nausea or vomiting.  He has had no cough.  Is had no shortness of breath.  There has been no leg swelling.  He has had no rashes.  Overall, his performance status is ECOG 1.  Medications:  Allergies as of 07/11/2019      Reactions   Contrast Media [iodinated Diagnostic Agents] Nausea And Vomiting   Patient vomits immediately after IV contrast is injected just prior to start of scan, was unable to scan patient in correct time frame.  Patient did not warn me of this when dosing the patient with oral CM and asking the patient history questions until the patient was on the table  for the CT scan just prior to injection.  Ct Tech:  Karl Bales 07/29/17    Other Nausea And Vomiting   Patient vomits immediately after IV contrast is injected just prior to start of scan, was unable to scan patient in correct time frame.  Patient did not warn me of this when dosing the patient with oral CM and asking the patient history questions until the patient was on the table for the CT scan just prior to injection.  Ct Tech:  Karl Bales 07/29/17       Medication List       Accurate as of July 11, 2019  1:01 PM. If you have any questions, ask your nurse or doctor.        acetaminophen 500 MG tablet Commonly known as: TYLENOL Take by mouth.   CALCIUM-VITAMIN D3 PO Take 1 tablet by mouth daily.   diltiazem 240 MG 24 hr capsule Commonly known as: CARDIZEM CD TAKE 1 CAPSULE BY MOUTH EVERY DAY   dronabinol 5 MG capsule Commonly known as: MARINOL TAKE 1 CAPSULE(5 MG) BY MOUTH TWICE DAILY BEFORE A MEAL   Erleada 60 MG tablet Generic drug: apalutamide TAKE 4 TABLETS (240 MG TOTAL) BY MOUTH DAILY. MAY BE TAKEN WITH OR WITHOUT FOOD. SWALLOW TABLETS WHOLE.   fentaNYL 100 MCG/HR Commonly known as: [place] 2  patches onto the skin every 3 (three) days.   gabapentin 800 MG tablet Commonly known as: NEURONTIN Take 800 mg by mouth 3 (three) times daily. What changed: Another medication with the same name was removed. Continue taking this medication, and follow the directions you see here. Changed by: [name], MD   megestrol 625 MG/5ML suspension Commonly known as: MEGACE ES Take 5 mLs (625 mg total) by mouth 2 (two) times daily.   metoprolol tartrate 25 MG tablet Commonly known as: LOPRESSOR Take 0.5 tablets (12.5 mg total) by mouth 2 (two) times daily.   Narcan 4 MG/0.1ML Liqd nasal spray kit Generic drug: naloxone NAR REP ALN   ondansetron 4 MG disintegrating tablet Commonly known as: Zofran ODT Take 1 tablet (4 mg total) by mouth every 8 (eight)  hours as needed for nausea or vomiting.   ondansetron 4 MG tablet Commonly known as: ZOFRAN TAKE 1 TABLET (4 MG TOTAL) BY MOUTH EVERY 6 (SIX) HOURS AS NEEDED FOR NAUSEA OR VOMITING.   oxyCODONE-acetaminophen 10-325 MG tablet Commonly known as: Percocet Take 1 tablet by mouth every 4 (four) hours as needed for pain.   tamsulosin 0.4 MG Caps capsule Commonly known as: FLOMAX TAKE 1 CAPSULE BY MOUTH EVERY DAY       Allergies:  Allergies  Allergen Reactions  . Contrast Media [Iodinated Diagnostic Agents] Nausea And Vomiting    Patient vomits immediately after IV contrast is injected just prior to start of scan, was unable to scan patient in correct time frame.  Patient did not warn me of this when dosing the patient with oral CM and asking the patient history questions until the patient was on the table for the CT scan just prior to injection.  Ct Tech:  Karl Bales 07/29/17   . Other Nausea And Vomiting    Patient vomits immediately after IV contrast is injected just prior to start of scan, was unable to scan patient in correct time frame.  Patient did not warn me of this when dosing the patient with oral CM and asking the patient history questions until the patient was on the table for the CT scan just prior to injection.  Ct Tech:  Karl Bales 07/29/17     Past Medical History, Surgical history, Social history, and Family History were reviewed and updated.  Review of Systems: Review of Systems  Constitutional: Negative.   HENT: Negative.   Eyes: Negative.   Respiratory: Negative.   Cardiovascular: Negative.   Gastrointestinal: Negative.   Genitourinary: Negative.   Musculoskeletal: Negative.   Skin: Negative.   Neurological: Negative.   Endo/Heme/Allergies: Negative.   Psychiatric/Behavioral: Negative.      Physical Exam:  weight is 165 lb (74.8 kg). His temporal temperature is 97.5 F (36.4 C) (abnormal). His blood pressure is 123/71 and his pulse is 71. His  respiration is 18 and oxygen saturation is 100%.   Wt Readings from Last 3 Encounters:  07/11/19 165 lb (74.8 kg)  04/11/19 171 lb (77.6 kg)  01/10/19 171 lb 12.8 oz (77.9 kg)    Physical Exam Vitals reviewed.  HENT:     Head: Normocephalic and atraumatic.  Eyes:     Pupils: Pupils are equal, round, and reactive to light.  Cardiovascular:     Rate and Rhythm: Normal rate and regular rhythm.     Heart sounds: Normal heart sounds.  Pulmonary:     Effort: Pulmonary effort is normal.     Breath sounds: Normal breath sounds.  Abdominal:     General: Bowel sounds are normal.     Palpations: Abdomen is soft.  Musculoskeletal:        General: No tenderness or deformity. Normal range of motion.     Cervical back: Normal range of motion.  Lymphadenopathy:     Cervical: No cervical adenopathy.  Skin:    General: Skin is warm and dry.     Findings: No erythema or rash.  Neurological:     Mental Status: He is alert and oriented to person, place, and time.  Psychiatric:        Behavior: Behavior normal.        Thought Content: Thought content normal.        Judgment: Judgment normal.      Lab Results  Component Value Date   WBC 10.5 07/11/2019   HGB 14.0 07/11/2019   HCT 42.2 07/11/2019   MCV 92.3 07/11/2019   PLT 162 07/11/2019   Lab Results  Component Value Date   FERRITIN 216 11/04/2017   IRON 77 11/04/2017   TIBC 261 11/04/2017   UIBC 184 11/04/2017   IRONPCTSAT 30 (L) 11/04/2017   Lab Results  Component Value Date   RBC 4.57 07/11/2019   Lab Results  Component Value Date   KAPLAMBRATIO 0.89 03/28/2016   Lab Results  Component Value Date   IGGSERUM 1,066 03/28/2016   IGMSERUM 106 03/28/2016   No results found for: Odetta Pink, SPEI   Chemistry      Component Value Date/Time   NA 139 07/11/2019 1211   NA 143 12/16/2016 1022   K 4.1 07/11/2019 1211   K 3.5 12/16/2016 1022   CL 102 07/11/2019 1211    CL 101 12/16/2016 1022   CO2 30 07/11/2019 1211   CO2 33 12/16/2016 1022   BUN 10 07/11/2019 1211   BUN 4 (L) 12/16/2016 1022   CREATININE 0.94 07/11/2019 1211   CREATININE 0.8 12/16/2016 1022      Component Value Date/Time   CALCIUM 9.6 07/11/2019 1211   CALCIUM 9.4 12/16/2016 1022   ALKPHOS 64 07/11/2019 1211   ALKPHOS 77 12/16/2016 1022   AST 9 (L) 07/11/2019 1211   ALT 6 07/11/2019 1211   ALT 16 12/16/2016 1022   BILITOT 0.3 07/11/2019 1211       Impression and Plan: Mr. Lombardo is avery pleasant 59 yo African American gentleman with metastatic prostate cancer.  Again, he is done incredibly well.  I believe that there is still some sensitivity to androgen since he really has not been on chemotherapy.  We will continue to follow his PSA as this is always a good indicator.  He will get his Zometa and Lupron today.  We do this every 3 months.  He does keep his testosterone level down.  I am just glad that his quality of life is doing so well.  We will continue to pray for his poor wife.  Hopefully, he will be able to take her to [facility] this summer.  We will have him back in 3 months.    [name], MD 6/14/20211:01 PM

## 2019-07-11 NOTE — Patient Instructions (Signed)
Leuprolide depot injection What is this medicine? LEUPROLIDE (loo PROE lide) is a man-made protein that acts like a natural hormone in the body. It decreases testosterone in men and decreases estrogen in women. In men, this medicine is used to treat advanced prostate cancer. In women, some forms of this medicine may be used to treat endometriosis, uterine fibroids, or other male hormone-related problems. This medicine may be used for other purposes; ask your health care provider or pharmacist if you have questions. COMMON BRAND NAME(S): Eligard, Fensolv, Lupron Depot, Lupron Depot-Ped, Viadur What should I tell my health care provider before I take this medicine? They need to know if you have any of these conditions:  diabetes  heart disease or previous heart attack  high blood pressure  high cholesterol  mental illness  osteoporosis  pain or difficulty passing urine  seizures  spinal cord metastasis  stroke  suicidal thoughts, plans, or attempt; a previous suicide attempt by you or a family member  tobacco smoker  unusual vaginal bleeding (women)  an unusual or allergic reaction to leuprolide, benzyl alcohol, other medicines, foods, dyes, or preservatives  pregnant or trying to get pregnant  breast-feeding How should I use this medicine? This medicine is for injection into a muscle or for injection under the skin. It is given by a health care professional in a hospital or clinic setting. The specific product will determine how it will be given to you. Make sure you understand which product you receive and how often you will receive it. Talk to your pediatrician regarding the use of this medicine in children. Special care may be needed. Overdosage: If you think you have taken too much of this medicine contact a poison control center or emergency room at once. NOTE: This medicine is only for you. Do not share this medicine with others. What if I miss a dose? It is  important not to miss a dose. Call your doctor or health care professional if you are unable to keep an appointment. Depot injections: Depot injections are given either once-monthly, every 12 weeks, every 16 weeks, or every 24 weeks depending on the product you are prescribed. The product you are prescribed will be based on if you are male or male, and your condition. Make sure you understand your product and dosing. What may interact with this medicine? Do not take this medicine with any of the following medications:  chasteberry This medicine may also interact with the following medications:  herbal or dietary supplements, like black cohosh or DHEA  male hormones, like estrogens or progestins and birth control pills, patches, rings, or injections  male hormones, like testosterone This list may not describe all possible interactions. Give your health care provider a list of all the medicines, herbs, non-prescription drugs, or dietary supplements you use. Also tell them if you smoke, drink alcohol, or use illegal drugs. Some items may interact with your medicine. What should I watch for while using this medicine? Visit your doctor or health care professional for regular checks on your progress. During the first weeks of treatment, your symptoms may get worse, but then will improve as you continue your treatment. You may get hot flashes, increased bone pain, increased difficulty passing urine, or an aggravation of nerve symptoms. Discuss these effects with your doctor or health care professional, some of them may improve with continued use of this medicine. Male patients may experience a menstrual cycle or spotting during the first months of therapy with this medicine.   If this continues, contact your doctor or health care professional. This medicine may increase blood sugar. Ask your healthcare provider if changes in diet or medicines are needed if you have diabetes. What side effects may I  notice from receiving this medicine? Side effects that you should report to your doctor or health care professional as soon as possible:  allergic reactions like skin rash, itching or hives, swelling of the face, lips, or tongue  breathing problems  chest pain  depression or memory disorders  pain in your legs or groin  pain at site where injected or implanted  seizures  severe headache  signs and symptoms of high blood sugar such as being more thirsty or hungry or having to urinate more than normal. You may also feel very tired or have blurry vision  swelling of the feet and legs  suicidal thoughts or other mood changes  visual changes  vomiting Side effects that usually do not require medical attention (report to your doctor or health care professional if they continue or are bothersome):  breast swelling or tenderness  decrease in sex drive or performance  diarrhea  hot flashes  loss of appetite  muscle, joint, or bone pains  nausea  redness or irritation at site where injected or implanted  skin problems or acne This list may not describe all possible side effects. Call your doctor for medical advice about side effects. You may report side effects to FDA at 1-800-FDA-1088. Where should I keep my medicine? This drug is given in a hospital or clinic and will not be stored at home. NOTE: This sheet is a summary. It may not cover all possible information. If you have questions about this medicine, talk to your doctor, pharmacist, or health care provider.  2020 [employer]/Gold Standard (2017-11-12 09:27:03) Zoledronic Acid injection (Hypercalcemia, Oncology) What is this medicine? ZOLEDRONIC ACID (ZOE le dron ik AS id) lowers the amount of calcium loss from bone. It is used to treat too much calcium in your blood from cancer. It is also used to prevent complications of cancer that has spread to the bone. This medicine may be used for other purposes; ask your  health care provider or pharmacist if you have questions. COMMON BRAND NAME(S): Zometa What should I tell my health care provider before I take this medicine? They need to know if you have any of these conditions:  aspirin-sensitive asthma  cancer, especially if you are receiving medicines used to treat cancer  dental disease or wear dentures  infection  kidney disease  receiving corticosteroids like dexamethasone or prednisone  an unusual or allergic reaction to zoledronic acid, other medicines, foods, dyes, or preservatives  pregnant or trying to get pregnant  breast-feeding How should I use this medicine? This medicine is for infusion into a vein. It is given by a health care professional in a hospital or clinic setting. Talk to your pediatrician regarding the use of this medicine in children. Special care may be needed. Overdosage: If you think you have taken too much of this medicine contact a poison control center or emergency room at once. NOTE: This medicine is only for you. Do not share this medicine with others. What if I miss a dose? It is important not to miss your dose. Call your doctor or health care professional if you are unable to keep an appointment. What may interact with this medicine?  certain antibiotics given by injection  NSAIDs, medicines for pain and inflammation, like ibuprofen or naproxen    some diuretics like bumetanide, furosemide  teriparatide  thalidomide This list may not describe all possible interactions. Give your health care provider a list of all the medicines, herbs, non-prescription drugs, or dietary supplements you use. Also tell them if you smoke, drink alcohol, or use illegal drugs. Some items may interact with your medicine. What should I watch for while using this medicine? Visit your doctor or health care professional for regular checkups. It may be some time before you see the benefit from this medicine. Do not stop taking your  medicine unless your doctor tells you to. Your doctor may order blood tests or other tests to see how you are doing. Women should inform their doctor if they wish to become pregnant or think they might be pregnant. There is a potential for serious side effects to an unborn child. Talk to your health care professional or pharmacist for more information. You should make sure that you get enough calcium and vitamin D while you are taking this medicine. Discuss the foods you eat and the vitamins you take with your health care professional. Some people who take this medicine have severe bone, joint, and/or muscle pain. This medicine may also increase your risk for jaw problems or a broken thigh bone. Tell your doctor right away if you have severe pain in your jaw, bones, joints, or muscles. Tell your doctor if you have any pain that does not go away or that gets worse. Tell your dentist and dental surgeon that you are taking this medicine. You should not have major dental surgery while on this medicine. See your dentist to have a dental exam and fix any dental problems before starting this medicine. Take good care of your teeth while on this medicine. Make sure you see your dentist for regular follow-up appointments. What side effects may I notice from receiving this medicine? Side effects that you should report to your doctor or health care professional as soon as possible:  allergic reactions like skin rash, itching or hives, swelling of the face, lips, or tongue  anxiety, confusion, or depression  breathing problems  changes in vision  eye pain  feeling faint or lightheaded, falls  jaw pain, especially after dental work  mouth sores  muscle cramps, stiffness, or weakness  redness, blistering, peeling or loosening of the skin, including inside the mouth  trouble passing urine or change in the amount of urine Side effects that usually do not require medical attention (report to your doctor  or health care professional if they continue or are bothersome):  bone, joint, or muscle pain  constipation  diarrhea  fever  hair loss  irritation at site where injected  loss of appetite  nausea, vomiting  stomach upset  trouble sleeping  trouble swallowing  weak or tired This list may not describe all possible side effects. Call your doctor for medical advice about side effects. You may report side effects to FDA at 1-800-FDA-1088. Where should I keep my medicine? This drug is given in a hospital or clinic and will not be stored at home. NOTE: This sheet is a summary. It may not cover all possible information. If you have questions about this medicine, talk to your doctor, pharmacist, or health care provider.  2020 [employer]/Gold Standard (2013-06-11 14:19:39)  

## 2019-07-12 LAB — PSA, TOTAL AND FREE
PSA, Free Pct: 10 %
PSA, Free: 0.07 ng/mL
Prostate Specific Ag, Serum: 0.7 ng/mL (ref 0.0–4.0)

## 2019-07-12 LAB — TESTOSTERONE: Testosterone: <3 ng/dL — ABNORMAL LOW (ref 264–916)

## 2019-07-18 MED FILL — ERLEADA 60 MG TABS: 60 | 30 days supply | Qty: 120 | Fill #1

## 2019-07-20 ENCOUNTER — Other Ambulatory Visit: Payer: Self-pay | Admitting: *Deleted

## 2019-07-20 DIAGNOSIS — C7951 Secondary malignant neoplasm of bone: Secondary | ICD-10-CM

## 2019-07-20 DIAGNOSIS — C61 Malignant neoplasm of prostate: Secondary | ICD-10-CM

## 2019-07-20 DIAGNOSIS — I4891 Unspecified atrial fibrillation: Secondary | ICD-10-CM

## 2019-07-20 MED ORDER — METOPROLOL TARTRATE 25 MG PO TABS: 12.5000 mg | ORAL_TABLET | Freq: Two times a day (BID) | ORAL | 3 refills | Status: DC

## 2019-08-05 ENCOUNTER — Other Ambulatory Visit: Payer: Self-pay | Admitting: *Deleted

## 2019-08-05 DIAGNOSIS — R52 Pain, unspecified: Secondary | ICD-10-CM

## 2019-08-05 DIAGNOSIS — M5442 Lumbago with sciatica, left side: Secondary | ICD-10-CM

## 2019-08-05 DIAGNOSIS — G8929 Other chronic pain: Secondary | ICD-10-CM

## 2019-08-05 DIAGNOSIS — C7951 Secondary malignant neoplasm of bone: Secondary | ICD-10-CM

## 2019-08-05 DIAGNOSIS — C61 Malignant neoplasm of prostate: Secondary | ICD-10-CM

## 2019-08-05 MED ORDER — FENTANYL 100 MCG/HR TD PT72: 2.0000 | MEDICATED_PATCH | TRANSDERMAL | 0 refills | Status: DC

## 2019-08-05 MED ORDER — OXYCODONE-ACETAMINOPHEN 10-325 MG PO TABS: 1.0000 | ORAL_TABLET | ORAL | 0 refills | Status: DC | PRN

## 2019-08-17 MED FILL — ERLEADA 60 MG TABS: 60 | 30 days supply | Qty: 120 | Fill #2

## 2019-09-05 ENCOUNTER — Other Ambulatory Visit: Payer: Self-pay | Admitting: *Deleted

## 2019-09-05 DIAGNOSIS — G8929 Other chronic pain: Secondary | ICD-10-CM

## 2019-09-05 DIAGNOSIS — R52 Pain, unspecified: Secondary | ICD-10-CM

## 2019-09-05 DIAGNOSIS — C61 Malignant neoplasm of prostate: Secondary | ICD-10-CM

## 2019-09-05 MED ORDER — OXYCODONE-ACETAMINOPHEN 10-325 MG PO TABS: 1.0000 | ORAL_TABLET | ORAL | 0 refills | Status: DC | PRN

## 2019-09-05 MED ORDER — FENTANYL 100 MCG/HR TD PT72: 2.0000 | MEDICATED_PATCH | TRANSDERMAL | 0 refills | Status: DC

## 2019-09-15 MED FILL — ERLEADA 60 MG TABS: 60 | 30 days supply | Qty: 120 | Fill #3

## 2019-10-06 ENCOUNTER — Other Ambulatory Visit: Payer: Self-pay

## 2019-10-06 DIAGNOSIS — C61 Malignant neoplasm of prostate: Secondary | ICD-10-CM

## 2019-10-06 DIAGNOSIS — M5442 Lumbago with sciatica, left side: Secondary | ICD-10-CM

## 2019-10-06 DIAGNOSIS — R52 Pain, unspecified: Secondary | ICD-10-CM

## 2019-10-06 DIAGNOSIS — G8929 Other chronic pain: Secondary | ICD-10-CM

## 2019-10-06 DIAGNOSIS — C7951 Secondary malignant neoplasm of bone: Secondary | ICD-10-CM

## 2019-10-06 MED ORDER — FENTANYL 100 MCG/HR TD PT72: 2.0000 | MEDICATED_PATCH | TRANSDERMAL | 0 refills | Status: DC

## 2019-10-06 MED ORDER — OXYCODONE-ACETAMINOPHEN 10-325 MG PO TABS: 1.0000 | ORAL_TABLET | ORAL | 0 refills | Status: DC | PRN

## 2019-10-12 ENCOUNTER — Telehealth: Payer: Self-pay | Admitting: Hematology & Oncology

## 2019-10-12 ENCOUNTER — Encounter: Payer: Self-pay | Admitting: Hematology & Oncology

## 2019-10-12 ENCOUNTER — Other Ambulatory Visit: Payer: Self-pay

## 2019-10-12 ENCOUNTER — Inpatient Hospital Stay: Payer: Medicare Other | Attending: Hematology & Oncology

## 2019-10-12 ENCOUNTER — Inpatient Hospital Stay: Payer: Medicare Other

## 2019-10-12 ENCOUNTER — Inpatient Hospital Stay (HOSPITAL_BASED_OUTPATIENT_CLINIC_OR_DEPARTMENT_OTHER): Payer: Medicare Other | Admitting: Hematology & Oncology

## 2019-10-12 VITALS — BP 112/74 | HR 77 | Temp 98.5°F | Resp 16 | Wt 168.2 lb

## 2019-10-12 DIAGNOSIS — C61 Malignant neoplasm of prostate: Secondary | ICD-10-CM | POA: Diagnosis present

## 2019-10-12 DIAGNOSIS — C7951 Secondary malignant neoplasm of bone: Secondary | ICD-10-CM

## 2019-10-12 DIAGNOSIS — Z5111 Encounter for antineoplastic chemotherapy: Secondary | ICD-10-CM | POA: Diagnosis present

## 2019-10-12 LAB — CMP (CANCER CENTER ONLY)
ALT: 12 U/L (ref 0–44)
AST: 14 U/L — ABNORMAL LOW (ref 15–41)
Albumin: 4.1 g/dL (ref 3.5–5.0)
Alkaline Phosphatase: 82 U/L (ref 38–126)
Anion gap: 7 (ref 5–15)
BUN: 7 mg/dL (ref 6–20)
CO2: 31 mmol/L (ref 22–32)
Calcium: 9.3 mg/dL (ref 8.9–10.3)
Chloride: 102 mmol/L (ref 98–111)
Creatinine: 0.85 mg/dL (ref 0.61–1.24)
GFR, Est AFR Am: >60 mL/min (ref >60)
GFR, Estimated: >60 mL/min (ref >60)
Glucose, Bld: 111 mg/dL — ABNORMAL HIGH (ref 70–99)
Potassium: 3.8 mmol/L (ref 3.5–5.1)
Sodium: 140 mmol/L (ref 135–145)
Total Bilirubin: 0.3 mg/dL (ref 0.3–1.2)
Total Protein: 6.6 g/dL (ref 6.5–8.1)

## 2019-10-12 LAB — CBC WITH DIFFERENTIAL (CANCER CENTER ONLY)
Abs Immature Granulocytes: 0.03 10*3/uL (ref 0.00–0.07)
Basophils Absolute: 0.1 10*3/uL (ref 0.0–0.1)
Basophils Relative: 1 %
Eosinophils Absolute: 0.4 10*3/uL (ref 0.0–0.5)
Eosinophils Relative: 4 %
HCT: 41.9 % (ref 39.0–52.0)
Hemoglobin: 13.9 g/dL (ref 13.0–17.0)
Immature Granulocytes: 0 %
Lymphocytes Relative: 21 %
Lymphs Abs: 1.8 10*3/uL (ref 0.7–4.0)
MCH: 30.6 pg (ref 26.0–34.0)
MCHC: 33.2 g/dL (ref 30.0–36.0)
MCV: 92.3 fL (ref 80.0–100.0)
Monocytes Absolute: 0.8 10*3/uL (ref 0.1–1.0)
Monocytes Relative: 9 %
Neutro Abs: 5.5 10*3/uL (ref 1.7–7.7)
Neutrophils Relative %: 65 %
Platelet Count: 143 10*3/uL — ABNORMAL LOW (ref 150–400)
RBC: 4.54 MIL/uL (ref 4.22–5.81)
RDW: 13.7 % (ref 11.5–15.5)
WBC Count: 8.5 10*3/uL (ref 4.0–10.5)
nRBC: 0 % (ref 0.0–0.2)

## 2019-10-12 MED ORDER — LEUPROLIDE ACETATE (3 MONTH) 22.5 MG ~~LOC~~ KIT
22.5000 mg | PACK | Freq: Once | SUBCUTANEOUS | Status: AC
  Administered 2019-10-12: 22.5 mg via SUBCUTANEOUS
  Filled 2019-10-12: qty 22.5

## 2019-10-12 MED ORDER — ZOLEDRONIC ACID 4 MG/100ML IV SOLN
4.0000 mg | Freq: Once | INTRAVENOUS | Status: AC
  Administered 2019-10-12: 4 mg via INTRAVENOUS
  Filled 2019-10-12: qty 100

## 2019-10-12 MED ORDER — SODIUM CHLORIDE 0.9 % IV SOLN
INTRAVENOUS | Status: DC
  Filled 2019-10-12: qty 250

## 2019-10-12 NOTE — Patient Instructions (Signed)
Leuprolide depot injection What is this medicine? LEUPROLIDE (loo PROE lide) is a man-made protein that acts like a natural hormone in the body. It decreases testosterone in men and decreases estrogen in women. In men, this medicine is used to treat advanced prostate cancer. In women, some forms of this medicine may be used to treat endometriosis, uterine fibroids, or other male hormone-related problems. This medicine may be used for other purposes; ask your health care provider or pharmacist if you have questions. COMMON BRAND NAME(S): Eligard, Fensolv, Lupron Depot, Lupron Depot-Ped, Viadur What should I tell my health care provider before I take this medicine? They need to know if you have any of these conditions:  diabetes  heart disease or previous heart attack  high blood pressure  high cholesterol  mental illness  osteoporosis  pain or difficulty passing urine  seizures  spinal cord metastasis  stroke  suicidal thoughts, plans, or attempt; a previous suicide attempt by you or a family member  tobacco smoker  unusual vaginal bleeding (women)  an unusual or allergic reaction to leuprolide, benzyl alcohol, other medicines, foods, dyes, or preservatives  pregnant or trying to get pregnant  breast-feeding How should I use this medicine? This medicine is for injection into a muscle or for injection under the skin. It is given by a health care professional in a hospital or clinic setting. The specific product will determine how it will be given to you. Make sure you understand which product you receive and how often you will receive it. Talk to your pediatrician regarding the use of this medicine in children. Special care may be needed. Overdosage: If you think you have taken too much of this medicine contact a poison control center or emergency room at once. NOTE: This medicine is only for you. Do not share this medicine with others. What if I miss a dose? It is  important not to miss a dose. Call your doctor or health care professional if you are unable to keep an appointment. Depot injections: Depot injections are given either once-monthly, every 12 weeks, every 16 weeks, or every 24 weeks depending on the product you are prescribed. The product you are prescribed will be based on if you are male or male, and your condition. Make sure you understand your product and dosing. What may interact with this medicine? Do not take this medicine with any of the following medications:  chasteberry This medicine may also interact with the following medications:  herbal or dietary supplements, like black cohosh or DHEA  male hormones, like estrogens or progestins and birth control pills, patches, rings, or injections  male hormones, like testosterone This list may not describe all possible interactions. Give your health care provider a list of all the medicines, herbs, non-prescription drugs, or dietary supplements you use. Also tell them if you smoke, drink alcohol, or use illegal drugs. Some items may interact with your medicine. What should I watch for while using this medicine? Visit your doctor or health care professional for regular checks on your progress. During the first weeks of treatment, your symptoms may get worse, but then will improve as you continue your treatment. You may get hot flashes, increased bone pain, increased difficulty passing urine, or an aggravation of nerve symptoms. Discuss these effects with your doctor or health care professional, some of them may improve with continued use of this medicine. Male patients may experience a menstrual cycle or spotting during the first months of therapy with this medicine.   If this continues, contact your doctor or health care professional. This medicine may increase blood sugar. Ask your healthcare provider if changes in diet or medicines are needed if you have diabetes. What side effects may I  notice from receiving this medicine? Side effects that you should report to your doctor or health care professional as soon as possible:  allergic reactions like skin rash, itching or hives, swelling of the face, lips, or tongue  breathing problems  chest pain  depression or memory disorders  pain in your legs or groin  pain at site where injected or implanted  seizures  severe headache  signs and symptoms of high blood sugar such as being more thirsty or hungry or having to urinate more than normal. You may also feel very tired or have blurry vision  swelling of the feet and legs  suicidal thoughts or other mood changes  visual changes  vomiting Side effects that usually do not require medical attention (report to your doctor or health care professional if they continue or are bothersome):  breast swelling or tenderness  decrease in sex drive or performance  diarrhea  hot flashes  loss of appetite  muscle, joint, or bone pains  nausea  redness or irritation at site where injected or implanted  skin problems or acne This list may not describe all possible side effects. Call your doctor for medical advice about side effects. You may report side effects to FDA at 1-800-FDA-1088. Where should I keep my medicine? This drug is given in a hospital or clinic and will not be stored at home. NOTE: This sheet is a summary. It may not cover all possible information. If you have questions about this medicine, talk to your doctor, pharmacist, or health care provider.  2020 [employer]/Gold Standard (2017-11-12 09:27:03) Zoledronic Acid injection (Hypercalcemia, Oncology) What is this medicine? ZOLEDRONIC ACID (ZOE le dron ik AS id) lowers the amount of calcium loss from bone. It is used to treat too much calcium in your blood from cancer. It is also used to prevent complications of cancer that has spread to the bone. This medicine may be used for other purposes; ask your  health care provider or pharmacist if you have questions. COMMON BRAND NAME(S): Zometa What should I tell my health care provider before I take this medicine? They need to know if you have any of these conditions:  aspirin-sensitive asthma  cancer, especially if you are receiving medicines used to treat cancer  dental disease or wear dentures  infection  kidney disease  receiving corticosteroids like dexamethasone or prednisone  an unusual or allergic reaction to zoledronic acid, other medicines, foods, dyes, or preservatives  pregnant or trying to get pregnant  breast-feeding How should I use this medicine? This medicine is for infusion into a vein. It is given by a health care professional in a hospital or clinic setting. Talk to your pediatrician regarding the use of this medicine in children. Special care may be needed. Overdosage: If you think you have taken too much of this medicine contact a poison control center or emergency room at once. NOTE: This medicine is only for you. Do not share this medicine with others. What if I miss a dose? It is important not to miss your dose. Call your doctor or health care professional if you are unable to keep an appointment. What may interact with this medicine?  certain antibiotics given by injection  NSAIDs, medicines for pain and inflammation, like ibuprofen or naproxen    some diuretics like bumetanide, furosemide  teriparatide  thalidomide This list may not describe all possible interactions. Give your health care provider a list of all the medicines, herbs, non-prescription drugs, or dietary supplements you use. Also tell them if you smoke, drink alcohol, or use illegal drugs. Some items may interact with your medicine. What should I watch for while using this medicine? Visit your doctor or health care professional for regular checkups. It may be some time before you see the benefit from this medicine. Do not stop taking your  medicine unless your doctor tells you to. Your doctor may order blood tests or other tests to see how you are doing. Women should inform their doctor if they wish to become pregnant or think they might be pregnant. There is a potential for serious side effects to an unborn child. Talk to your health care professional or pharmacist for more information. You should make sure that you get enough calcium and vitamin D while you are taking this medicine. Discuss the foods you eat and the vitamins you take with your health care professional. Some people who take this medicine have severe bone, joint, and/or muscle pain. This medicine may also increase your risk for jaw problems or a broken thigh bone. Tell your doctor right away if you have severe pain in your jaw, bones, joints, or muscles. Tell your doctor if you have any pain that does not go away or that gets worse. Tell your dentist and dental surgeon that you are taking this medicine. You should not have major dental surgery while on this medicine. See your dentist to have a dental exam and fix any dental problems before starting this medicine. Take good care of your teeth while on this medicine. Make sure you see your dentist for regular follow-up appointments. What side effects may I notice from receiving this medicine? Side effects that you should report to your doctor or health care professional as soon as possible:  allergic reactions like skin rash, itching or hives, swelling of the face, lips, or tongue  anxiety, confusion, or depression  breathing problems  changes in vision  eye pain  feeling faint or lightheaded, falls  jaw pain, especially after dental work  mouth sores  muscle cramps, stiffness, or weakness  redness, blistering, peeling or loosening of the skin, including inside the mouth  trouble passing urine or change in the amount of urine Side effects that usually do not require medical attention (report to your doctor  or health care professional if they continue or are bothersome):  bone, joint, or muscle pain  constipation  diarrhea  fever  hair loss  irritation at site where injected  loss of appetite  nausea, vomiting  stomach upset  trouble sleeping  trouble swallowing  weak or tired This list may not describe all possible side effects. Call your doctor for medical advice about side effects. You may report side effects to FDA at 1-800-FDA-1088. Where should I keep my medicine? This drug is given in a hospital or clinic and will not be stored at home. NOTE: This sheet is a summary. It may not cover all possible information. If you have questions about this medicine, talk to your doctor, pharmacist, or health care provider.  2020 [employer]/Gold Standard (2013-06-11 14:19:39)  

## 2019-10-12 NOTE — Patient Instructions (Signed)
Zoledronic Acid injection (Hypercalcemia, Oncology) What is this medicine? ZOLEDRONIC ACID (ZOE le dron ik AS id) lowers the amount of calcium loss from bone. It is used to treat too much calcium in your blood from cancer. It is also used to prevent complications of cancer that has spread to the bone. This medicine may be used for other purposes; ask your health care provider or pharmacist if you have questions. COMMON BRAND NAME(S): Zometa What should I tell my health care provider before I take this medicine? They need to know if you have any of these conditions:  aspirin-sensitive asthma  cancer, especially if you are receiving medicines used to treat cancer  dental disease or wear dentures  infection  kidney disease  receiving corticosteroids like dexamethasone or prednisone  an unusual or allergic reaction to zoledronic acid, other medicines, foods, dyes, or preservatives  pregnant or trying to get pregnant  breast-feeding How should I use this medicine? This medicine is for infusion into a vein. It is given by a health care professional in a hospital or clinic setting. Talk to your pediatrician regarding the use of this medicine in children. Special care may be needed. Overdosage: If you think you have taken too much of this medicine contact a poison control center or emergency room at once. NOTE: This medicine is only for you. Do not share this medicine with others. What if I miss a dose? It is important not to miss your dose. Call your doctor or health care professional if you are unable to keep an appointment. What may interact with this medicine?  certain antibiotics given by injection  NSAIDs, medicines for pain and inflammation, like ibuprofen or naproxen  some diuretics like bumetanide, furosemide  teriparatide  thalidomide This list may not describe all possible interactions. Give your health care provider a list of all the medicines, herbs, non-prescription  drugs, or dietary supplements you use. Also tell them if you smoke, drink alcohol, or use illegal drugs. Some items may interact with your medicine. What should I watch for while using this medicine? Visit your doctor or health care professional for regular checkups. It may be some time before you see the benefit from this medicine. Do not stop taking your medicine unless your doctor tells you to. Your doctor may order blood tests or other tests to see how you are doing. Women should inform their doctor if they wish to become pregnant or think they might be pregnant. There is a potential for serious side effects to an unborn child. Talk to your health care professional or pharmacist for more information. You should make sure that you get enough calcium and vitamin D while you are taking this medicine. Discuss the foods you eat and the vitamins you take with your health care professional. Some people who take this medicine have severe bone, joint, and/or muscle pain. This medicine may also increase your risk for jaw problems or a broken thigh bone. Tell your doctor right away if you have severe pain in your jaw, bones, joints, or muscles. Tell your doctor if you have any pain that does not go away or that gets worse. Tell your dentist and dental surgeon that you are taking this medicine. You should not have major dental surgery while on this medicine. See your dentist to have a dental exam and fix any dental problems before starting this medicine. Take good care of your teeth while on this medicine. Make sure you see your dentist for regular follow-up   appointments. What side effects may I notice from receiving this medicine? Side effects that you should report to your doctor or health care professional as soon as possible:  allergic reactions like skin rash, itching or hives, swelling of the face, lips, or tongue  anxiety, confusion, or depression  breathing problems  changes in vision  eye  pain  feeling faint or lightheaded, falls  jaw pain, especially after dental work  mouth sores  muscle cramps, stiffness, or weakness  redness, blistering, peeling or loosening of the skin, including inside the mouth  trouble passing urine or change in the amount of urine Side effects that usually do not require medical attention (report to your doctor or health care professional if they continue or are bothersome):  bone, joint, or muscle pain  constipation  diarrhea  fever  hair loss  irritation at site where injected  loss of appetite  nausea, vomiting  stomach upset  trouble sleeping  trouble swallowing  weak or tired This list may not describe all possible side effects. Call your doctor for medical advice about side effects. You may report side effects to FDA at 1-800-FDA-1088. Where should I keep my medicine? This drug is given in a hospital or clinic and will not be stored at home. NOTE: This sheet is a summary. It may not cover all possible information. If you have questions about this medicine, talk to your doctor, pharmacist, or health care provider.  2020 [employer]/Gold Standard (2013-06-11 14:19:39) Leuprolide depot injection What is this medicine? LEUPROLIDE (loo PROE lide) is a man-made protein that acts like a natural hormone in the body. It decreases testosterone in men and decreases estrogen in women. In men, this medicine is used to treat advanced prostate cancer. In women, some forms of this medicine may be used to treat endometriosis, uterine fibroids, or other male hormone-related problems. This medicine may be used for other purposes; ask your health care provider or pharmacist if you have questions. COMMON BRAND NAME(S): Eligard, Fensolv, Lupron Depot, Lupron Depot-Ped, Viadur What should I tell my health care provider before I take this medicine? They need to know if you have any of these conditions: diabetes heart disease or previous heart  attack high blood pressure high cholesterol mental illness osteoporosis pain or difficulty passing urine seizures spinal cord metastasis stroke suicidal thoughts, plans, or attempt; a previous suicide attempt by you or a family member tobacco smoker unusual vaginal bleeding (women) an unusual or allergic reaction to leuprolide, benzyl alcohol, other medicines, foods, dyes, or preservatives pregnant or trying to get pregnant breast-feeding How should I use this medicine? This medicine is for injection into a muscle or for injection under the skin. It is given by a health care professional in a hospital or clinic setting. The specific product will determine how it will be given to you. Make sure you understand which product you receive and how often you will receive it. Talk to your pediatrician regarding the use of this medicine in children. Special care may be needed. Overdosage: If you think you have taken too much of this medicine contact a poison control center or emergency room at once. NOTE: This medicine is only for you. Do not share this medicine with others. What if I miss a dose? It is important not to miss a dose. Call your doctor or health care professional if you are unable to keep an appointment. Depot injections: Depot injections are given either once-monthly, every 12 weeks, every 16 weeks, or every  24 weeks depending on the product you are prescribed. The product you are prescribed will be based on if you are male or male, and your condition. Make sure you understand your product and dosing. What may interact with this medicine? Do not take this medicine with any of the following medications: chasteberry This medicine may also interact with the following medications: herbal or dietary supplements, like black cohosh or DHEA male hormones, like estrogens or progestins and birth control pills, patches, rings, or injections male hormones, like testosterone This list may  not describe all possible interactions. Give your health care provider a list of all the medicines, herbs, non-prescription drugs, or dietary supplements you use. Also tell them if you smoke, drink alcohol, or use illegal drugs. Some items may interact with your medicine. What should I watch for while using this medicine? Visit your doctor or health care professional for regular checks on your progress. During the first weeks of treatment, your symptoms may get worse, but then will improve as you continue your treatment. You may get hot flashes, increased bone pain, increased difficulty passing urine, or an aggravation of nerve symptoms. Discuss these effects with your doctor or health care professional, some of them may improve with continued use of this medicine. Male patients may experience a menstrual cycle or spotting during the first months of therapy with this medicine. If this continues, contact your doctor or health care professional. This medicine may increase blood sugar. Ask your healthcare provider if changes in diet or medicines are needed if you have diabetes. What side effects may I notice from receiving this medicine? Side effects that you should report to your doctor or health care professional as soon as possible: allergic reactions like skin rash, itching or hives, swelling of the face, lips, or tongue breathing problems chest pain depression or memory disorders pain in your legs or groin pain at site where injected or implanted seizures severe headache signs and symptoms of high blood sugar such as being more thirsty or hungry or having to urinate more than normal. You may also feel very tired or have blurry vision swelling of the feet and legs suicidal thoughts or other mood changes visual changes vomiting Side effects that usually do not require medical attention (report to your doctor or health care professional if they continue or are bothersome): breast swelling or  tenderness decrease in sex drive or performance diarrhea hot flashes loss of appetite muscle, joint, or bone pains nausea redness or irritation at site where injected or implanted skin problems or acne This list may not describe all possible side effects. Call your doctor for medical advice about side effects. You may report side effects to FDA at 1-800-FDA-1088. Where should I keep my medicine? This drug is given in a hospital or clinic and will not be stored at home. NOTE: This sheet is a summary. It may not cover all possible information. If you have questions about this medicine, talk to your doctor, pharmacist, or health care provider.  2020 [employer] Standard (2017-11-12 09:27:03)

## 2019-10-12 NOTE — Progress Notes (Signed)
Hematology and Oncology Follow Up Visit  Ryan Davis [id number] 08/04/1960 60 y.o. 10/12/2019   Principle Diagnosis:  Metastatic prostate cancer - androgen sensitive  Past Therapy: 14 fractions of radiation to the lumbar spine Casodex 50 mg by mouth daily- stopped 04/02/2017 due to progression Zytiga 1000 mg by mouth daily/prednisone 10 mg by mouth daily - started on 04/20/2016- d/c on 04/02/2017 Xtandi160 mg po q day - startedon 04/06/2017-- d/c on 06/24/2017 Flutamide 250 mg po q8hr - started06/12/2017-- d/c on 04/23/18  Current Therapy:   Lupron 11.5 mg IM every 3 month- next dose12/2021  Zometa 4 mg IV q3 months- next dose12/2021 Apalutamide 240 mg po q day -- startedon 04/28/2018   Interim History:  Mr. Boehle is here today for follow-up and treatment.  Unfortunately, he had a stepson died from the coronavirus.  This was just recently.  This is taken a lot on his wife.  He feels good.  I think he is working for a [employer].  Pain is not doing all that bad for him.  He is pretty comfortable with the oxycodone that we use.  He has had no issues with nausea or vomiting.  There is been no diarrhea.  His PSA we checked 3 months ago was 0.7.  This is still quite low but yet is going up slowly.  We will have to watch this.  He has had some hot flashes.  His testosterone levels have been quite low.  He has had no rashes.  He has had no fever.  There is no cough or shortness of breath.  Overall, his performance status is ECOG 1.   Medications:  Allergies as of 10/12/2019      Reactions   Contrast Media [iodinated Diagnostic Agents] Nausea And Vomiting   Patient vomits immediately after IV contrast is injected just prior to start of scan, was unable to scan patient in correct time frame.  Patient did not warn me of this when dosing the patient with oral CM and asking the patient history questions until the patient was on the table for the CT scan just  prior to injection.  Ct Tech:  Karl Bales 07/29/17    Other Nausea And Vomiting   Patient vomits immediately after IV contrast is injected just prior to start of scan, was unable to scan patient in correct time frame.  Patient did not warn me of this when dosing the patient with oral CM and asking the patient history questions until the patient was on the table for the CT scan just prior to injection.  Ct Tech:  Karl Bales 07/29/17       Medication List       Accurate as of October 12, 2019 12:04 PM. If you have any questions, ask your nurse or doctor.        acetaminophen 500 MG tablet Commonly known as: TYLENOL Take by mouth.   CALCIUM-VITAMIN D3 PO Take 1 tablet by mouth daily.   diltiazem 240 MG 24 hr capsule Commonly known as: CARDIZEM CD TAKE 1 CAPSULE BY MOUTH EVERY DAY   dronabinol 5 MG capsule Commonly known as: MARINOL TAKE 1 CAPSULE(5 MG) BY MOUTH TWICE DAILY BEFORE A MEAL   Erleada 60 MG tablet Generic drug: apalutamide TAKE 4 TABLETS (240 MG TOTAL) BY MOUTH DAILY. MAY BE TAKEN WITH OR WITHOUT FOOD. SWALLOW TABLETS WHOLE.   fentaNYL 100 MCG/HR Commonly known as: DURAGESIC Place 2 patches onto the skin every 3 (three) days.  gabapentin 800 MG tablet Commonly known as: NEURONTIN Take 800 mg by mouth 3 (three) times daily.   megestrol 625 MG/5ML suspension Commonly known as: MEGACE ES Take 5 mLs (625 mg total) by mouth 2 (two) times daily.   metoprolol tartrate 25 MG tablet Commonly known as: LOPRESSOR Take 0.5 tablets (12.5 mg total) by mouth 2 (two) times daily.   Narcan 4 MG/0.1ML Liqd nasal spray kit Generic drug: naloxone NAR REP ALN   ondansetron 4 MG disintegrating tablet Commonly known as: Zofran ODT Take 1 tablet (4 mg total) by mouth every 8 (eight) hours as needed for nausea or vomiting.   ondansetron 4 MG tablet Commonly known as: ZOFRAN TAKE 1 TABLET (4 MG TOTAL) BY MOUTH EVERY 6 (SIX) HOURS AS NEEDED FOR NAUSEA OR VOMITING.    oxyCODONE-acetaminophen 10-325 MG tablet Commonly known as: Percocet Take 1 tablet by mouth every 4 (four) hours as needed for pain.   tamsulosin 0.4 MG Caps capsule Commonly known as: FLOMAX TAKE 1 CAPSULE BY MOUTH EVERY DAY       Allergies:  Allergies  Allergen Reactions  . Contrast Media [Iodinated Diagnostic Agents] Nausea And Vomiting    Patient vomits immediately after IV contrast is injected just prior to start of scan, was unable to scan patient in correct time frame.  Patient did not warn me of this when dosing the patient with oral CM and asking the patient history questions until the patient was on the table for the CT scan just prior to injection.  Ct Tech:  Karl Bales 07/29/17   . Other Nausea And Vomiting    Patient vomits immediately after IV contrast is injected just prior to start of scan, was unable to scan patient in correct time frame.  Patient did not warn me of this when dosing the patient with oral CM and asking the patient history questions until the patient was on the table for the CT scan just prior to injection.  Ct Tech:  Karl Bales 07/29/17     Past Medical History, Surgical history, Social history, and Family History were reviewed and updated.  Review of Systems: Review of Systems  Constitutional: Negative.   HENT: Negative.   Eyes: Negative.   Respiratory: Negative.   Cardiovascular: Negative.   Gastrointestinal: Negative.   Genitourinary: Negative.   Musculoskeletal: Negative.   Skin: Negative.   Neurological: Negative.   Endo/Heme/Allergies: Negative.   Psychiatric/Behavioral: Negative.      Physical Exam:  weight is 168 lb 4 oz (76.3 kg). His oral temperature is 98.5 F (36.9 C). His blood pressure is 112/74 and his pulse is 77. His respiration is 16 and oxygen saturation is 100%.   Wt Readings from Last 3 Encounters:  10/12/19 168 lb 4 oz (76.3 kg)  07/11/19 165 lb (74.8 kg)  04/11/19 171 lb (77.6 kg)    Physical Exam Vitals  reviewed.  HENT:     Head: Normocephalic and atraumatic.  Eyes:     Pupils: Pupils are equal, round, and reactive to light.  Cardiovascular:     Rate and Rhythm: Normal rate and regular rhythm.     Heart sounds: Normal heart sounds.  Pulmonary:     Effort: Pulmonary effort is normal.     Breath sounds: Normal breath sounds.  Abdominal:     General: Bowel sounds are normal.     Palpations: Abdomen is soft.  Musculoskeletal:        General: No tenderness or deformity. Normal range of  motion.     Cervical back: Normal range of motion.  Lymphadenopathy:     Cervical: No cervical adenopathy.  Skin:    General: Skin is warm and dry.     Findings: No erythema or rash.  Neurological:     Mental Status: He is alert and oriented to person, place, and time.  Psychiatric:        Behavior: Behavior normal.        Thought Content: Thought content normal.        Judgment: Judgment normal.      Lab Results  Component Value Date   WBC 8.5 10/12/2019   HGB 13.9 10/12/2019   HCT 41.9 10/12/2019   MCV 92.3 10/12/2019   PLT 143 (L) 10/12/2019   Lab Results  Component Value Date   FERRITIN 216 11/04/2017   IRON 77 11/04/2017   TIBC 261 11/04/2017   UIBC 184 11/04/2017   IRONPCTSAT 30 (L) 11/04/2017   Lab Results  Component Value Date   RBC 4.54 10/12/2019   Lab Results  Component Value Date   KAPLAMBRATIO 0.89 03/28/2016   Lab Results  Component Value Date   IGGSERUM 1,066 03/28/2016   IGMSERUM 106 03/28/2016   No results found for: Odetta Pink, SPEI   Chemistry      Component Value Date/Time   NA 139 07/11/2019 1211   NA 143 12/16/2016 1022   K 4.1 07/11/2019 1211   K 3.5 12/16/2016 1022   CL 102 07/11/2019 1211   CL 101 12/16/2016 1022   CO2 30 07/11/2019 1211   CO2 33 12/16/2016 1022   BUN 10 07/11/2019 1211   BUN 4 (L) 12/16/2016 1022   CREATININE 0.94 07/11/2019 1211   CREATININE 0.8 12/16/2016 1022       Component Value Date/Time   CALCIUM 9.6 07/11/2019 1211   CALCIUM 9.4 12/16/2016 1022   ALKPHOS 64 07/11/2019 1211   ALKPHOS 77 12/16/2016 1022   AST 9 (L) 07/11/2019 1211   ALT 6 07/11/2019 1211   ALT 16 12/16/2016 1022   BILITOT 0.3 07/11/2019 1211       Impression and Plan: Mr. Soderman is avery pleasant 59 yo African American gentleman with metastatic prostate cancer.  Again, he is done incredibly well.  I believe that there is still some sensitivity to androgen since he really has not been on chemotherapy.  We will continue to follow his PSA as this is always a good indicator.  He will get his Zometa and Lupron today.  We do this every 3 months.  He does keep his testosterone level down.  I am just glad that his quality of life is doing so well.  I will certainly pray for he and his family over the death of his stepson.  I know this is quite traumatic for them.  We will have him back in 3 months.    [name], MD 9/15/202112:04 PM

## 2019-10-12 NOTE — Telephone Encounter (Signed)
I called and spoke with patient regarding appointments scheduled he was ok with both date/time per 9/15 los

## 2019-10-13 LAB — PSA, TOTAL AND FREE
PSA, Free Pct: 13.3 %
PSA, Free: 0.16 ng/mL
Prostate Specific Ag, Serum: 1.2 ng/mL (ref 0.0–4.0)

## 2019-10-13 LAB — TESTOSTERONE: Testosterone: 5 ng/dL — ABNORMAL LOW (ref 264–916)

## 2019-10-13 MED FILL — ERLEADA 60 MG TABS: 60 | 30 days supply | Qty: 120 | Fill #4

## 2019-10-14 ENCOUNTER — Other Ambulatory Visit: Payer: Self-pay | Admitting: Hematology & Oncology

## 2019-11-03 ENCOUNTER — Other Ambulatory Visit: Payer: Self-pay | Admitting: *Deleted

## 2019-11-03 DIAGNOSIS — C61 Malignant neoplasm of prostate: Secondary | ICD-10-CM

## 2019-11-03 DIAGNOSIS — C7951 Secondary malignant neoplasm of bone: Secondary | ICD-10-CM

## 2019-11-03 DIAGNOSIS — R52 Pain, unspecified: Secondary | ICD-10-CM

## 2019-11-03 DIAGNOSIS — G8929 Other chronic pain: Secondary | ICD-10-CM

## 2019-11-03 DIAGNOSIS — M5442 Lumbago with sciatica, left side: Secondary | ICD-10-CM

## 2019-11-03 MED ORDER — OXYCODONE-ACETAMINOPHEN 10-325 MG PO TABS: 1.0000 | ORAL_TABLET | ORAL | 0 refills | Status: DC | PRN

## 2019-11-03 MED ORDER — FENTANYL 100 MCG/HR TD PT72: 2.0000 | MEDICATED_PATCH | TRANSDERMAL | 0 refills | Status: DC

## 2019-11-10 MED FILL — ERLEADA 60 MG TABS: 60 | 30 days supply | Qty: 120 | Fill #5

## 2019-11-11 ENCOUNTER — Other Ambulatory Visit: Payer: Self-pay | Admitting: Family

## 2019-11-11 DIAGNOSIS — C61 Malignant neoplasm of prostate: Secondary | ICD-10-CM

## 2019-11-11 MED ORDER — TAMSULOSIN HCL 0.4 MG PO CAPS: 0.4000 mg | ORAL_CAPSULE | Freq: Every day | ORAL | 2 refills | Status: DC

## 2019-11-21 MED FILL — ERLEADA 60 MG TABS: 60 | 30 days supply | Qty: 120 | Fill #5

## 2019-12-06 ENCOUNTER — Other Ambulatory Visit: Payer: Self-pay | Admitting: *Deleted

## 2019-12-06 DIAGNOSIS — R52 Pain, unspecified: Secondary | ICD-10-CM

## 2019-12-06 DIAGNOSIS — M5442 Lumbago with sciatica, left side: Secondary | ICD-10-CM

## 2019-12-06 DIAGNOSIS — C61 Malignant neoplasm of prostate: Secondary | ICD-10-CM

## 2019-12-06 DIAGNOSIS — C7951 Secondary malignant neoplasm of bone: Secondary | ICD-10-CM

## 2019-12-06 DIAGNOSIS — G8929 Other chronic pain: Secondary | ICD-10-CM

## 2019-12-06 MED ORDER — FENTANYL 100 MCG/HR TD PT72: 2.0000 | MEDICATED_PATCH | TRANSDERMAL | 0 refills | Status: DC

## 2019-12-06 MED ORDER — OXYCODONE-ACETAMINOPHEN 10-325 MG PO TABS: 1.0000 | ORAL_TABLET | ORAL | 0 refills | Status: DC | PRN

## 2019-12-20 MED FILL — ERLEADA 60 MG TABS: 60 | 30 days supply | Qty: 120 | Fill #6

## 2020-01-05 ENCOUNTER — Other Ambulatory Visit: Payer: Self-pay

## 2020-01-05 DIAGNOSIS — M5441 Lumbago with sciatica, right side: Secondary | ICD-10-CM

## 2020-01-05 DIAGNOSIS — C61 Malignant neoplasm of prostate: Secondary | ICD-10-CM

## 2020-01-05 DIAGNOSIS — C7951 Secondary malignant neoplasm of bone: Secondary | ICD-10-CM

## 2020-01-05 DIAGNOSIS — G8929 Other chronic pain: Secondary | ICD-10-CM

## 2020-01-05 MED ORDER — FENTANYL 100 MCG/HR TD PT72: 2.0000 | MEDICATED_PATCH | TRANSDERMAL | 0 refills | Status: DC

## 2020-01-06 ENCOUNTER — Other Ambulatory Visit: Payer: Self-pay

## 2020-01-06 DIAGNOSIS — R52 Pain, unspecified: Secondary | ICD-10-CM

## 2020-01-06 DIAGNOSIS — C61 Malignant neoplasm of prostate: Secondary | ICD-10-CM

## 2020-01-06 MED ORDER — OXYCODONE-ACETAMINOPHEN 10-325 MG PO TABS: 1.0000 | ORAL_TABLET | ORAL | 0 refills | Status: DC | PRN

## 2020-01-11 ENCOUNTER — Telehealth: Payer: Self-pay | Admitting: Family

## 2020-01-11 ENCOUNTER — Inpatient Hospital Stay (HOSPITAL_BASED_OUTPATIENT_CLINIC_OR_DEPARTMENT_OTHER): Payer: Medicare Other | Admitting: Family

## 2020-01-11 ENCOUNTER — Inpatient Hospital Stay: Payer: Medicare Other

## 2020-01-11 ENCOUNTER — Encounter: Payer: Self-pay | Admitting: Family

## 2020-01-11 ENCOUNTER — Other Ambulatory Visit: Payer: Self-pay

## 2020-01-11 ENCOUNTER — Inpatient Hospital Stay: Payer: Medicare Other | Attending: Hematology & Oncology

## 2020-01-11 VITALS — BP 104/68 | HR 82 | Temp 98.2°F | Resp 18 | Ht 74.0 in | Wt 172.8 lb

## 2020-01-11 DIAGNOSIS — C61 Malignant neoplasm of prostate: Secondary | ICD-10-CM | POA: Diagnosis present

## 2020-01-11 DIAGNOSIS — C7951 Secondary malignant neoplasm of bone: Secondary | ICD-10-CM

## 2020-01-11 DIAGNOSIS — Z5111 Encounter for antineoplastic chemotherapy: Secondary | ICD-10-CM | POA: Diagnosis present

## 2020-01-11 LAB — CMP (CANCER CENTER ONLY)
ALT: 10 U/L (ref 0–44)
AST: 10 U/L — ABNORMAL LOW (ref 15–41)
Albumin: 4.1 g/dL (ref 3.5–5.0)
Alkaline Phosphatase: 90 U/L (ref 38–126)
Anion gap: 6 (ref 5–15)
BUN: 8 mg/dL (ref 6–20)
CO2: 30 mmol/L (ref 22–32)
Calcium: 9.4 mg/dL (ref 8.9–10.3)
Chloride: 104 mmol/L (ref 98–111)
Creatinine: 0.93 mg/dL (ref 0.61–1.24)
GFR, Estimated: >60 mL/min (ref >60)
Glucose, Bld: 105 mg/dL — ABNORMAL HIGH (ref 70–99)
Potassium: 4.3 mmol/L (ref 3.5–5.1)
Sodium: 140 mmol/L (ref 135–145)
Total Bilirubin: 0.3 mg/dL (ref 0.3–1.2)
Total Protein: 6.4 g/dL — ABNORMAL LOW (ref 6.5–8.1)

## 2020-01-11 LAB — CBC WITH DIFFERENTIAL (CANCER CENTER ONLY)
Abs Immature Granulocytes: 0.02 10*3/uL (ref 0.00–0.07)
Basophils Absolute: 0 10*3/uL (ref 0.0–0.1)
Basophils Relative: 1 %
Eosinophils Absolute: 0.3 10*3/uL (ref 0.0–0.5)
Eosinophils Relative: 4 %
HCT: 42.2 % (ref 39.0–52.0)
Hemoglobin: 14 g/dL (ref 13.0–17.0)
Immature Granulocytes: 0 %
Lymphocytes Relative: 28 %
Lymphs Abs: 2.3 10*3/uL (ref 0.7–4.0)
MCH: 30.2 pg (ref 26.0–34.0)
MCHC: 33.2 g/dL (ref 30.0–36.0)
MCV: 91.1 fL (ref 80.0–100.0)
Monocytes Absolute: 0.6 10*3/uL (ref 0.1–1.0)
Monocytes Relative: 7 %
Neutro Abs: 4.9 10*3/uL (ref 1.7–7.7)
Neutrophils Relative %: 60 %
Platelet Count: 152 10*3/uL (ref 150–400)
RBC: 4.63 MIL/uL (ref 4.22–5.81)
RDW: 14.2 % (ref 11.5–15.5)
WBC Count: 8.1 10*3/uL (ref 4.0–10.5)
nRBC: 0 % (ref 0.0–0.2)

## 2020-01-11 MED ORDER — ZOLEDRONIC ACID 4 MG/100ML IV SOLN
4.0000 mg | Freq: Once | INTRAVENOUS | Status: AC
  Administered 2020-01-11: 4 mg via INTRAVENOUS
  Filled 2020-01-11: qty 100

## 2020-01-11 MED ORDER — SODIUM CHLORIDE 0.9 % IV SOLN
Freq: Once | INTRAVENOUS | Status: AC
  Filled 2020-01-11: qty 250

## 2020-01-11 MED ORDER — LEUPROLIDE ACETATE (3 MONTH) 22.5 MG ~~LOC~~ KIT
22.5000 mg | PACK | Freq: Once | SUBCUTANEOUS | Status: AC
  Administered 2020-01-11: 22.5 mg via SUBCUTANEOUS
  Filled 2020-01-11: qty 22.5

## 2020-01-11 NOTE — Telephone Encounter (Signed)
Appointments scheduled calendar printed per 12/15 los 

## 2020-01-11 NOTE — Patient Instructions (Signed)
Leuprolide depot injection What is this medicine? LEUPROLIDE (loo PROE lide) is a man-made protein that acts like a natural hormone in the body. It decreases testosterone in men and decreases estrogen in women. In men, this medicine is used to treat advanced prostate cancer. In women, some forms of this medicine may be used to treat endometriosis, uterine fibroids, or other male hormone-related problems. This medicine may be used for other purposes; ask your health care provider or pharmacist if you have questions. COMMON BRAND NAME(S): Eligard, Fensolv, Lupron Depot, Lupron Depot-Ped, Viadur What should I tell my health care provider before I take this medicine? They need to know if you have any of these conditions:  diabetes  heart disease or previous heart attack  high blood pressure  high cholesterol  mental illness  osteoporosis  pain or difficulty passing urine  seizures  spinal cord metastasis  stroke  suicidal thoughts, plans, or attempt; a previous suicide attempt by you or a family member  tobacco smoker  unusual vaginal bleeding (women)  an unusual or allergic reaction to leuprolide, benzyl alcohol, other medicines, foods, dyes, or preservatives  pregnant or trying to get pregnant  breast-feeding How should I use this medicine? This medicine is for injection into a muscle or for injection under the skin. It is given by a health care professional in a hospital or clinic setting. The specific product will determine how it will be given to you. Make sure you understand which product you receive and how often you will receive it. Talk to your pediatrician regarding the use of this medicine in children. Special care may be needed. Overdosage: If you think you have taken too much of this medicine contact a poison control center or emergency room at once. NOTE: This medicine is only for you. Do not share this medicine with others. What if I miss a dose? It is  important not to miss a dose. Call your doctor or health care professional if you are unable to keep an appointment. Depot injections: Depot injections are given either once-monthly, every 12 weeks, every 16 weeks, or every 24 weeks depending on the product you are prescribed. The product you are prescribed will be based on if you are male or male, and your condition. Make sure you understand your product and dosing. What may interact with this medicine? Do not take this medicine with any of the following medications:  chasteberry This medicine may also interact with the following medications:  herbal or dietary supplements, like black cohosh or DHEA  male hormones, like estrogens or progestins and birth control pills, patches, rings, or injections  male hormones, like testosterone This list may not describe all possible interactions. Give your health care provider a list of all the medicines, herbs, non-prescription drugs, or dietary supplements you use. Also tell them if you smoke, drink alcohol, or use illegal drugs. Some items may interact with your medicine. What should I watch for while using this medicine? Visit your doctor or health care professional for regular checks on your progress. During the first weeks of treatment, your symptoms may get worse, but then will improve as you continue your treatment. You may get hot flashes, increased bone pain, increased difficulty passing urine, or an aggravation of nerve symptoms. Discuss these effects with your doctor or health care professional, some of them may improve with continued use of this medicine. Male patients may experience a menstrual cycle or spotting during the first months of therapy with this medicine.  If this continues, contact your doctor or health care professional. This medicine may increase blood sugar. Ask your healthcare provider if changes in diet or medicines are needed if you have diabetes. What side effects may I  notice from receiving this medicine? Side effects that you should report to your doctor or health care professional as soon as possible:  allergic reactions like skin rash, itching or hives, swelling of the face, lips, or tongue  breathing problems  chest pain  depression or memory disorders  pain in your legs or groin  pain at site where injected or implanted  seizures  severe headache  signs and symptoms of high blood sugar such as being more thirsty or hungry or having to urinate more than normal. You may also feel very tired or have blurry vision  swelling of the feet and legs  suicidal thoughts or other mood changes  visual changes  vomiting Side effects that usually do not require medical attention (report to your doctor or health care professional if they continue or are bothersome):  breast swelling or tenderness  decrease in sex drive or performance  diarrhea  hot flashes  loss of appetite  muscle, joint, or bone pains  nausea  redness or irritation at site where injected or implanted  skin problems or acne This list may not describe all possible side effects. Call your doctor for medical advice about side effects. You may report side effects to FDA at 1-800-FDA-1088. Where should I keep my medicine? This drug is given in a hospital or clinic and will not be stored at home. NOTE: This sheet is a summary. It may not cover all possible information. If you have questions about this medicine, talk to your doctor, pharmacist, or health care provider.  2020 Elsevier/Gold Standard (2017-11-12 09:27:03) Zoledronic Acid injection (Hypercalcemia, Oncology) What is this medicine? ZOLEDRONIC ACID (ZOE le dron ik AS id) lowers the amount of calcium loss from bone. It is used to treat too much calcium in your blood from cancer. It is also used to prevent complications of cancer that has spread to the bone. This medicine may be used for other purposes; ask your  health care provider or pharmacist if you have questions. COMMON BRAND NAME(S): Zometa What should I tell my health care provider before I take this medicine? They need to know if you have any of these conditions:  aspirin-sensitive asthma  cancer, especially if you are receiving medicines used to treat cancer  dental disease or wear dentures  infection  kidney disease  receiving corticosteroids like dexamethasone or prednisone  an unusual or allergic reaction to zoledronic acid, other medicines, foods, dyes, or preservatives  pregnant or trying to get pregnant  breast-feeding How should I use this medicine? This medicine is for infusion into a vein. It is given by a health care professional in a hospital or clinic setting. Talk to your pediatrician regarding the use of this medicine in children. Special care may be needed. Overdosage: If you think you have taken too much of this medicine contact a poison control center or emergency room at once. NOTE: This medicine is only for you. Do not share this medicine with others. What if I miss a dose? It is important not to miss your dose. Call your doctor or health care professional if you are unable to keep an appointment. What may interact with this medicine?  certain antibiotics given by injection  NSAIDs, medicines for pain and inflammation, like ibuprofen or naproxen  some diuretics like bumetanide, furosemide  teriparatide  thalidomide This list may not describe all possible interactions. Give your health care provider a list of all the medicines, herbs, non-prescription drugs, or dietary supplements you use. Also tell them if you smoke, drink alcohol, or use illegal drugs. Some items may interact with your medicine. What should I watch for while using this medicine? Visit your doctor or health care professional for regular checkups. It may be some time before you see the benefit from this medicine. Do not stop taking your  medicine unless your doctor tells you to. Your doctor may order blood tests or other tests to see how you are doing. Women should inform their doctor if they wish to become pregnant or think they might be pregnant. There is a potential for serious side effects to an unborn child. Talk to your health care professional or pharmacist for more information. You should make sure that you get enough calcium and vitamin D while you are taking this medicine. Discuss the foods you eat and the vitamins you take with your health care professional. Some people who take this medicine have severe bone, joint, and/or muscle pain. This medicine may also increase your risk for jaw problems or a broken thigh bone. Tell your doctor right away if you have severe pain in your jaw, bones, joints, or muscles. Tell your doctor if you have any pain that does not go away or that gets worse. Tell your dentist and dental surgeon that you are taking this medicine. You should not have major dental surgery while on this medicine. See your dentist to have a dental exam and fix any dental problems before starting this medicine. Take good care of your teeth while on this medicine. Make sure you see your dentist for regular follow-up appointments. What side effects may I notice from receiving this medicine? Side effects that you should report to your doctor or health care professional as soon as possible:  allergic reactions like skin rash, itching or hives, swelling of the face, lips, or tongue  anxiety, confusion, or depression  breathing problems  changes in vision  eye pain  feeling faint or lightheaded, falls  jaw pain, especially after dental work  mouth sores  muscle cramps, stiffness, or weakness  redness, blistering, peeling or loosening of the skin, including inside the mouth  trouble passing urine or change in the amount of urine Side effects that usually do not require medical attention (report to your doctor  or health care professional if they continue or are bothersome):  bone, joint, or muscle pain  constipation  diarrhea  fever  hair loss  irritation at site where injected  loss of appetite  nausea, vomiting  stomach upset  trouble sleeping  trouble swallowing  weak or tired This list may not describe all possible side effects. Call your doctor for medical advice about side effects. You may report side effects to FDA at 1-800-FDA-1088. Where should I keep my medicine? This drug is given in a hospital or clinic and will not be stored at home. NOTE: This sheet is a summary. It may not cover all possible information. If you have questions about this medicine, talk to your doctor, pharmacist, or health care provider.  2020 Elsevier/Gold Standard (2013-06-11 14:19:39) Zoledronic Acid injection (Hypercalcemia, Oncology) What is this medicine? ZOLEDRONIC ACID (ZOE le dron ik AS id) lowers the amount of calcium loss from bone. It is used to treat too much calcium in your blood from  cancer. It is also used to prevent complications of cancer that has spread to the bone. This medicine may be used for other purposes; ask your health care provider or pharmacist if you have questions. COMMON BRAND NAME(S): Zometa What should I tell my health care provider before I take this medicine? They need to know if you have any of these conditions:  aspirin-sensitive asthma  cancer, especially if you are receiving medicines used to treat cancer  dental disease or wear dentures  infection  kidney disease  receiving corticosteroids like dexamethasone or prednisone  an unusual or allergic reaction to zoledronic acid, other medicines, foods, dyes, or preservatives  pregnant or trying to get pregnant  breast-feeding How should I use this medicine? This medicine is for infusion into a vein. It is given by a health care professional in a hospital or clinic setting. Talk to your pediatrician  regarding the use of this medicine in children. Special care may be needed. Overdosage: If you think you have taken too much of this medicine contact a poison control center or emergency room at once. NOTE: This medicine is only for you. Do not share this medicine with others. What if I miss a dose? It is important not to miss your dose. Call your doctor or health care professional if you are unable to keep an appointment. What may interact with this medicine?  certain antibiotics given by injection  NSAIDs, medicines for pain and inflammation, like ibuprofen or naproxen  some diuretics like bumetanide, furosemide  teriparatide  thalidomide This list may not describe all possible interactions. Give your health care provider a list of all the medicines, herbs, non-prescription drugs, or dietary supplements you use. Also tell them if you smoke, drink alcohol, or use illegal drugs. Some items may interact with your medicine. What should I watch for while using this medicine? Visit your doctor or health care professional for regular checkups. It may be some time before you see the benefit from this medicine. Do not stop taking your medicine unless your doctor tells you to. Your doctor may order blood tests or other tests to see how you are doing. Women should inform their doctor if they wish to become pregnant or think they might be pregnant. There is a potential for serious side effects to an unborn child. Talk to your health care professional or pharmacist for more information. You should make sure that you get enough calcium and vitamin D while you are taking this medicine. Discuss the foods you eat and the vitamins you take with your health care professional. Some people who take this medicine have severe bone, joint, and/or muscle pain. This medicine may also increase your risk for jaw problems or a broken thigh bone. Tell your doctor right away if you have severe pain in your jaw, bones,  joints, or muscles. Tell your doctor if you have any pain that does not go away or that gets worse. Tell your dentist and dental surgeon that you are taking this medicine. You should not have major dental surgery while on this medicine. See your dentist to have a dental exam and fix any dental problems before starting this medicine. Take good care of your teeth while on this medicine. Make sure you see your dentist for regular follow-up appointments. What side effects may I notice from receiving this medicine? Side effects that you should report to your doctor or health care professional as soon as possible:  allergic reactions like skin rash, itching or  hives, swelling of the face, lips, or tongue  anxiety, confusion, or depression  breathing problems  changes in vision  eye pain  feeling faint or lightheaded, falls  jaw pain, especially after dental work  mouth sores  muscle cramps, stiffness, or weakness  redness, blistering, peeling or loosening of the skin, including inside the mouth  trouble passing urine or change in the amount of urine Side effects that usually do not require medical attention (report to your doctor or health care professional if they continue or are bothersome):  bone, joint, or muscle pain  constipation  diarrhea  fever  hair loss  irritation at site where injected  loss of appetite  nausea, vomiting  stomach upset  trouble sleeping  trouble swallowing  weak or tired This list may not describe all possible side effects. Call your doctor for medical advice about side effects. You may report side effects to FDA at 1-800-FDA-1088. Where should I keep my medicine? This drug is given in a hospital or clinic and will not be stored at home. NOTE: This sheet is a summary. It may not cover all possible information. If you have questions about this medicine, talk to your doctor, pharmacist, or health care provider.  2020 Elsevier/Gold Standard  (2013-06-11 14:19:39)

## 2020-01-11 NOTE — Progress Notes (Signed)
Hematology and Oncology Follow Up Visit  Ryan Davis [id number] Dec 08, 1960 59 y.o. 01/11/2020   Principle Diagnosis:  Metastatic prostate cancer - androgen sensitive  Past Therapy: 14 fractions of radiation to the lumbar spine Casodex 50 mg by mouth daily- stopped 04/02/2017 due to progression Zytiga 1000 mg by mouth daily/prednisone 10 mg by mouth daily - started on 04/20/2016- d/c on 04/02/2017 Xtandi160 mg po q day - startedon 04/06/2017-- d/c on 06/24/2017 Flutamide 250 mg po q8hr - started06/12/2017-- d/c on 04/23/18  Current Therapy:        Lupron 11.5 mg IM every 3 month- next dose03/2022  Zometa 4 mg IV q3 months- next dose03/2022 Apalutamide 240 mg po q day -- startedon 04/28/2018   Interim History:  Ryan Davis is here today for follow-up and treatment. He continues to do quite well.  He has pain in his legs due to arthritis but is able to get around nicely with his cane for added support.  No falls or syncope to report.  No swelling, numbness or tingling in his extremities.  PSA in September was 1.2. Testosterone was 5.  No fever, chills, n/v, cough, rash, dizziness, SOB, chest pain, palpitations, abdominal pain or changes in bowel or bladder habits.  He has maintained a good appetite and is staying hydrated. His weight is up to 172 lbs! He has not noted any blood loss. No bruising or petechiae.  He stays quite busy taking care of his wife. They plan to go get pedicures together this afternoon.   ECOG Performance Status: 1 - Symptomatic but completely ambulatory  Medications:  Allergies as of 01/11/2020      Reactions   Contrast Media [iodinated Diagnostic Agents] Nausea And Vomiting   Patient vomits immediately after IV contrast is injected just prior to start of scan, was unable to scan patient in correct time frame.  Patient did not warn me of this when dosing the patient with oral CM and asking the patient history questions until the patient was on  the table for the CT scan just prior to injection.  Ct Tech:  Ryan Davis 07/29/17    Other Nausea And Vomiting   Patient vomits immediately after IV contrast is injected just prior to start of scan, was unable to scan patient in correct time frame.  Patient did not warn me of this when dosing the patient with oral CM and asking the patient history questions until the patient was on the table for the CT scan just prior to injection.  Ct Tech:  Ryan Davis 07/29/17       Medication List       Accurate as of January 11, 2020 12:21 PM. If you have any questions, ask your nurse or doctor.        acetaminophen 500 MG tablet Commonly known as: TYLENOL Take by mouth.   CALCIUM-VITAMIN D3 PO Take 1 tablet by mouth daily.   diltiazem 240 MG 24 hr capsule Commonly known as: CARDIZEM CD TAKE 1 CAPSULE BY MOUTH EVERY DAY   dronabinol 5 MG capsule Commonly known as: MARINOL TAKE 1 CAPSULE(5 MG) BY MOUTH TWICE DAILY BEFORE A MEAL   Erleada 60 MG tablet Generic drug: apalutamide TAKE 4 TABLETS (240 MG TOTAL) BY MOUTH DAILY. MAY BE TAKEN WITH OR WITHOUT FOOD. SWALLOW TABLETS WHOLE.   fentaNYL 100 MCG/HR Commonly known as: DURAGESIC Place 2 patches onto the skin every 3 (three) days.   gabapentin 800 MG tablet Commonly known as: NEURONTIN Take 800  mg by mouth 3 (three) times daily.   megestrol 625 MG/5ML suspension Commonly known as: MEGACE ES Take 5 mLs (625 mg total) by mouth 2 (two) times daily.   metoprolol tartrate 25 MG tablet Commonly known as: LOPRESSOR Take 0.5 tablets (12.5 mg total) by mouth 2 (two) times daily.   Narcan 4 MG/0.1ML Liqd nasal spray kit Generic drug: naloxone NAR REP ALN   ondansetron 4 MG disintegrating tablet Commonly known as: Zofran ODT Take 1 tablet (4 mg total) by mouth every 8 (eight) hours as needed for nausea or vomiting.   ondansetron 4 MG tablet Commonly known as: ZOFRAN TAKE 1 TABLET (4 MG TOTAL) BY MOUTH EVERY 6 (SIX) HOURS AS NEEDED  FOR NAUSEA OR VOMITING.   oxyCODONE-acetaminophen 10-325 MG tablet Commonly known as: Percocet Take 1 tablet by mouth every 4 (four) hours as needed for pain.   tamsulosin 0.4 MG Caps capsule Commonly known as: FLOMAX Take 1 capsule (0.4 mg total) by mouth daily.       Allergies:  Allergies  Allergen Reactions   Contrast Media [Iodinated Diagnostic Agents] Nausea And Vomiting    Patient vomits immediately after IV contrast is injected just prior to start of scan, was unable to scan patient in correct time frame.  Patient did not warn me of this when dosing the patient with oral CM and asking the patient history questions until the patient was on the table for the CT scan just prior to injection.  Ct Tech:  Ryan Davis 07/29/17    Other Nausea And Vomiting    Patient vomits immediately after IV contrast is injected just prior to start of scan, was unable to scan patient in correct time frame.  Patient did not warn me of this when dosing the patient with oral CM and asking the patient history questions until the patient was on the table for the CT scan just prior to injection.  Ct Tech:  Ryan Davis 07/29/17     Past Medical History, Surgical history, Social history, and Family History were reviewed and updated.  Review of Systems: All other 10 point review of systems is negative.   Physical Exam:  height is 6' 2"  (1.88 m) and weight is 172 lb 12.8 oz (78.4 kg). His oral temperature is 98.2 F (36.8 C). His blood pressure is 104/68 and his pulse is 82. His respiration is 18 and oxygen saturation is 100%.   Wt Readings from Last 3 Encounters:  01/11/20 172 lb 12.8 oz (78.4 kg)  10/12/19 168 lb 4 oz (76.3 kg)  07/11/19 165 lb (74.8 kg)    Ocular: Sclerae unicteric, pupils equal, round and reactive to light Ear-nose-throat: Oropharynx clear, dentition fair Lymphatic: No cervical or supraclavicular adenopathy Lungs no rales or rhonchi, good excursion bilaterally Heart regular  rate and rhythm, no murmur appreciated Abd soft, nontender, positive bowel sounds MSK no focal spinal tenderness, no joint edema Neuro: non-focal, well-oriented, appropriate affect Breasts: Deferred   Lab Results  Component Value Date   WBC 8.1 01/11/2020   HGB 14.0 01/11/2020   HCT 42.2 01/11/2020   MCV 91.1 01/11/2020   PLT 152 01/11/2020   Lab Results  Component Value Date   FERRITIN 216 11/04/2017   IRON 77 11/04/2017   TIBC 261 11/04/2017   UIBC 184 11/04/2017   IRONPCTSAT 30 (L) 11/04/2017   Lab Results  Component Value Date   RBC 4.63 01/11/2020   Lab Results  Component Value Date   KAPLAMBRATIO 0.89 03/28/2016  Lab Results  Component Value Date   IGGSERUM 1,066 03/28/2016   IGMSERUM 106 03/28/2016   No results found for: Odetta Pink, SPEI   Chemistry      Component Value Date/Time   NA 140 01/11/2020 1119   NA 143 12/16/2016 1022   K 4.3 01/11/2020 1119   K 3.5 12/16/2016 1022   CL 104 01/11/2020 1119   CL 101 12/16/2016 1022   CO2 30 01/11/2020 1119   CO2 33 12/16/2016 1022   BUN 8 01/11/2020 1119   BUN 4 (L) 12/16/2016 1022   CREATININE 0.93 01/11/2020 1119   CREATININE 0.8 12/16/2016 1022      Component Value Date/Time   CALCIUM 9.4 01/11/2020 1119   CALCIUM 9.4 12/16/2016 1022   ALKPHOS 90 01/11/2020 1119   ALKPHOS 77 12/16/2016 1022   AST 10 (L) 01/11/2020 1119   ALT 10 01/11/2020 1119   ALT 16 12/16/2016 1022   BILITOT 0.3 01/11/2020 1119       Impression and Plan: Ryan Davis is a very pleasant 59 yo African American gentleman with metastatic prostate cancer.  He has done well on his current treatment regimen of Erleada, Lupron and Zometa.  His PSA and testosterone have remained stable in the past. Today's results are pending.  Follow-up in 3 months.  He was encouraged to contact our office with any questions or concerns.   Laverna Peace, NP 12/15/202112:21 PM

## 2020-01-12 LAB — PSA, TOTAL AND FREE
PSA, Free Pct: 11.2 %
PSA, Free: 0.29 ng/mL
Prostate Specific Ag, Serum: 2.6 ng/mL (ref 0.0–4.0)

## 2020-01-12 LAB — TESTOSTERONE: Testosterone: 12 ng/dL — ABNORMAL LOW (ref 264–916)

## 2020-01-17 ENCOUNTER — Other Ambulatory Visit: Payer: Self-pay | Admitting: Hematology & Oncology

## 2020-01-17 DIAGNOSIS — C7951 Secondary malignant neoplasm of bone: Secondary | ICD-10-CM

## 2020-01-17 DIAGNOSIS — C61 Malignant neoplasm of prostate: Secondary | ICD-10-CM

## 2020-01-18 MED FILL — ERLEADA 60 MG TABS: 60 | 30 days supply | Qty: 120 | Fill #0

## 2020-02-06 ENCOUNTER — Other Ambulatory Visit: Payer: Self-pay | Admitting: *Deleted

## 2020-02-06 DIAGNOSIS — R52 Pain, unspecified: Secondary | ICD-10-CM

## 2020-02-06 DIAGNOSIS — C7951 Secondary malignant neoplasm of bone: Secondary | ICD-10-CM

## 2020-02-06 DIAGNOSIS — M5442 Lumbago with sciatica, left side: Secondary | ICD-10-CM

## 2020-02-06 DIAGNOSIS — C61 Malignant neoplasm of prostate: Secondary | ICD-10-CM

## 2020-02-06 DIAGNOSIS — G8929 Other chronic pain: Secondary | ICD-10-CM

## 2020-02-06 MED ORDER — FENTANYL 100 MCG/HR TD PT72: 2.0000 | MEDICATED_PATCH | TRANSDERMAL | 0 refills | Status: DC

## 2020-02-06 MED ORDER — OXYCODONE-ACETAMINOPHEN 10-325 MG PO TABS: 1.0000 | ORAL_TABLET | ORAL | 0 refills | Status: DC | PRN

## 2020-02-16 MED FILL — ERLEADA 60 MG TABS: 60 | 30 days supply | Qty: 120 | Fill #1

## 2020-03-07 ENCOUNTER — Other Ambulatory Visit: Payer: Self-pay

## 2020-03-07 DIAGNOSIS — G8929 Other chronic pain: Secondary | ICD-10-CM

## 2020-03-07 DIAGNOSIS — R52 Pain, unspecified: Secondary | ICD-10-CM

## 2020-03-07 DIAGNOSIS — M5442 Lumbago with sciatica, left side: Secondary | ICD-10-CM

## 2020-03-07 DIAGNOSIS — C61 Malignant neoplasm of prostate: Secondary | ICD-10-CM

## 2020-03-07 DIAGNOSIS — C7951 Secondary malignant neoplasm of bone: Secondary | ICD-10-CM

## 2020-03-07 MED ORDER — FENTANYL 100 MCG/HR TD PT72: 2.0000 | MEDICATED_PATCH | TRANSDERMAL | 0 refills | Status: DC

## 2020-03-07 MED ORDER — OXYCODONE-ACETAMINOPHEN 10-325 MG PO TABS: 1.0000 | ORAL_TABLET | ORAL | 0 refills | Status: DC | PRN

## 2020-03-14 NOTE — Telephone Encounter (Signed)
na

## 2020-03-19 MED FILL — ERLEADA 60 MG TABS: 60 | 30 days supply | Qty: 120 | Fill #2

## 2020-03-27 ENCOUNTER — Other Ambulatory Visit: Payer: Self-pay | Admitting: Hematology & Oncology

## 2020-03-27 DIAGNOSIS — C61 Malignant neoplasm of prostate: Secondary | ICD-10-CM

## 2020-03-27 DIAGNOSIS — C7951 Secondary malignant neoplasm of bone: Secondary | ICD-10-CM

## 2020-03-27 DIAGNOSIS — I4891 Unspecified atrial fibrillation: Secondary | ICD-10-CM

## 2020-04-04 ENCOUNTER — Other Ambulatory Visit: Payer: Self-pay | Admitting: *Deleted

## 2020-04-04 DIAGNOSIS — M5442 Lumbago with sciatica, left side: Secondary | ICD-10-CM

## 2020-04-04 DIAGNOSIS — C61 Malignant neoplasm of prostate: Secondary | ICD-10-CM

## 2020-04-04 DIAGNOSIS — G8929 Other chronic pain: Secondary | ICD-10-CM

## 2020-04-04 DIAGNOSIS — R52 Pain, unspecified: Secondary | ICD-10-CM

## 2020-04-04 DIAGNOSIS — C7951 Secondary malignant neoplasm of bone: Secondary | ICD-10-CM

## 2020-04-04 MED ORDER — FENTANYL 100 MCG/HR TD PT72: 2.0000 | MEDICATED_PATCH | TRANSDERMAL | 0 refills | Status: DC

## 2020-04-04 MED ORDER — OXYCODONE-ACETAMINOPHEN 10-325 MG PO TABS: 1.0000 | ORAL_TABLET | ORAL | 0 refills | Status: DC | PRN

## 2020-04-06 ENCOUNTER — Telehealth: Payer: Self-pay

## 2020-04-06 NOTE — Telephone Encounter (Signed)
Pt called in to r./s his appt due to conflict with his wifes appt 04/10/20   Ryan Davis

## 2020-04-10 ENCOUNTER — Inpatient Hospital Stay: Payer: Medicare Other

## 2020-04-10 ENCOUNTER — Inpatient Hospital Stay: Payer: Medicare Other | Admitting: Family

## 2020-04-11 ENCOUNTER — Inpatient Hospital Stay (HOSPITAL_BASED_OUTPATIENT_CLINIC_OR_DEPARTMENT_OTHER): Payer: Medicare Other | Admitting: Family

## 2020-04-11 ENCOUNTER — Inpatient Hospital Stay: Payer: Medicare Other

## 2020-04-11 ENCOUNTER — Other Ambulatory Visit: Payer: Self-pay

## 2020-04-11 ENCOUNTER — Encounter: Payer: Self-pay | Admitting: Family

## 2020-04-11 ENCOUNTER — Inpatient Hospital Stay: Payer: Medicare Other | Attending: Hematology & Oncology

## 2020-04-11 VITALS — BP 105/71 | HR 75 | Temp 97.9°F | Resp 18 | Ht 74.0 in | Wt 173.5 lb

## 2020-04-11 DIAGNOSIS — C61 Malignant neoplasm of prostate: Secondary | ICD-10-CM

## 2020-04-11 DIAGNOSIS — C7951 Secondary malignant neoplasm of bone: Secondary | ICD-10-CM

## 2020-04-11 DIAGNOSIS — Z5111 Encounter for antineoplastic chemotherapy: Secondary | ICD-10-CM | POA: Insufficient documentation

## 2020-04-11 LAB — CBC WITH DIFFERENTIAL (CANCER CENTER ONLY)
Abs Immature Granulocytes: 0.02 10*3/uL (ref 0.00–0.07)
Basophils Absolute: 0 10*3/uL (ref 0.0–0.1)
Basophils Relative: 0 %
Eosinophils Absolute: 0.4 10*3/uL (ref 0.0–0.5)
Eosinophils Relative: 5 %
HCT: 41.2 % (ref 39.0–52.0)
Hemoglobin: 13.6 g/dL (ref 13.0–17.0)
Immature Granulocytes: 0 %
Lymphocytes Relative: 33 %
Lymphs Abs: 3 10*3/uL (ref 0.7–4.0)
MCH: 30.2 pg (ref 26.0–34.0)
MCHC: 33 g/dL (ref 30.0–36.0)
MCV: 91.6 fL (ref 80.0–100.0)
Monocytes Absolute: 0.8 10*3/uL (ref 0.1–1.0)
Monocytes Relative: 9 %
Neutro Abs: 4.9 10*3/uL (ref 1.7–7.7)
Neutrophils Relative %: 53 %
Platelet Count: 143 10*3/uL — ABNORMAL LOW (ref 150–400)
RBC: 4.5 MIL/uL (ref 4.22–5.81)
RDW: 13.9 % (ref 11.5–15.5)
WBC Count: 9.1 10*3/uL (ref 4.0–10.5)
nRBC: 0 % (ref 0.0–0.2)

## 2020-04-11 LAB — CMP (CANCER CENTER ONLY)
ALT: 8 U/L (ref 0–44)
AST: 10 U/L — ABNORMAL LOW (ref 15–41)
Albumin: 4.3 g/dL (ref 3.5–5.0)
Alkaline Phosphatase: 84 U/L (ref 38–126)
Anion gap: 7 (ref 5–15)
BUN: 9 mg/dL (ref 6–20)
CO2: 31 mmol/L (ref 22–32)
Calcium: 9.3 mg/dL (ref 8.9–10.3)
Chloride: 100 mmol/L (ref 98–111)
Creatinine: 0.93 mg/dL (ref 0.61–1.24)
GFR, Estimated: >60 mL/min (ref >60)
Glucose, Bld: 155 mg/dL — ABNORMAL HIGH (ref 70–99)
Potassium: 4.1 mmol/L (ref 3.5–5.1)
Sodium: 138 mmol/L (ref 135–145)
Total Bilirubin: 0.2 mg/dL — ABNORMAL LOW (ref 0.3–1.2)
Total Protein: 6.3 g/dL — ABNORMAL LOW (ref 6.5–8.1)

## 2020-04-11 MED ORDER — ZOLEDRONIC ACID 4 MG/100ML IV SOLN
4.0000 mg | Freq: Once | INTRAVENOUS | Status: AC
  Administered 2020-04-11: 4 mg via INTRAVENOUS
  Filled 2020-04-11: qty 100

## 2020-04-11 MED ORDER — LEUPROLIDE ACETATE (3 MONTH) 22.5 MG ~~LOC~~ KIT
22.5000 mg | PACK | Freq: Once | SUBCUTANEOUS | Status: AC
  Administered 2020-04-11: 22.5 mg via SUBCUTANEOUS
  Filled 2020-04-11: qty 22.5

## 2020-04-11 MED ORDER — SODIUM CHLORIDE 0.9 % IV SOLN
Freq: Once | INTRAVENOUS | Status: AC
  Filled 2020-04-11: qty 250

## 2020-04-11 MED ORDER — ZOLEDRONIC ACID 4 MG/5ML IV CONC: 4.0000 mg | Freq: Once | INTRAVENOUS | Status: DC

## 2020-04-11 NOTE — Progress Notes (Signed)
Hematology and Oncology Follow Up Visit  Ryan Davis [id number] 03-11-1960 60 y.o. 04/11/2020   Principle Diagnosis:  Metastatic prostate cancer - androgen sensitive  Past Therapy: 14 fractions of radiation to the lumbar spine Casodex 50 mg by mouth daily- stopped 04/02/2017 due to progression Zytiga 1000 mg by mouth daily/prednisone 10 mg by mouth daily - started on 04/20/2016- d/c on 04/02/2017 Xtandi160 mg po q day - startedon 04/06/2017-- d/c on 06/24/2017 Flutamide 250 mg po q8hr - started06/12/2017-- d/c on 04/23/18  Current Therapy: Lupron 11.5 mg IM every 3 month- next dose06/2022  Zometa 4 mg IV q3 months- next dose06/2022 Apalutamide 240 mg po q day -- startedon 04/28/2018   Interim History:  Ryan Davis is here today for follow-up and treatment. He continues to do well but states that he is having to do a lot of physical work helping his wife with ADL's despite having in home help. He denies any pain at this time.  PSA at last visit was 2.6 and testosterone was 12.  He notes some fatigue at times. He does not sleep well and stays up until 4 am most days watching westerns on TV.  No fever, chills, n/v, cough, rash, dizziness, SOB, chest pain, palpitations, abdominal pain or changes in bowel or bladder habits.  He will take a stool softener when needed for constipation. He states that he recently had a repeat colonoscopy and it was negative. Report showed 1 benign sessile polyp removed in the sigmoid colon and he had some small internal hemorrhoids.  No blood loss noted. No bruising or petechiae. 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 This does cause him some balance issues. He walks with a cane for added support.  No falls or syncope to report.   He has maintained a good appetite and is staying well hydrated. His weight is stable.   ECOG Performance Status: 1 - Symptomatic but completely ambulatory  Medications:  Allergies as of  04/11/2020      Reactions   Contrast Media [iodinated Diagnostic Agents] Nausea And Vomiting   Patient vomits immediately after IV contrast is injected just prior to start of scan, was unable to scan patient in correct time frame.  Patient did not warn me of this when dosing the patient with oral CM and asking the patient history questions until the patient was on the table for the CT scan just prior to injection.  Ct Tech:  Karl Bales 07/29/17    Other Nausea And Vomiting   Patient vomits immediately after IV contrast is injected just prior to start of scan, was unable to scan patient in correct time frame.  Patient did not warn me of this when dosing the patient with oral CM and asking the patient history questions until the patient was on the table for the CT scan just prior to injection.  Ct Tech:  Karl Bales 07/29/17       Medication List       Accurate as of April 11, 2020 11:26 AM. If you have any questions, ask your nurse or doctor.        acetaminophen 500 MG tablet Commonly known as: TYLENOL Take by mouth.   aspirin 81 MG chewable tablet Chew 1 tablet by mouth daily.   atorvastatin 10 MG tablet Commonly known as: LIPITOR TAKE 1 TABLET(10 MG) BY MOUTH DAILY   CALCIUM-VITAMIN D3 PO Take 1 tablet by mouth daily.   diltiazem 240 MG 24 hr capsule Commonly known as:  CARDIZEM CD TAKE 1 CAPSULE BY MOUTH EVERY DAY   dronabinol 5 MG capsule Commonly known as: MARINOL TAKE 1 CAPSULE(5 MG) BY MOUTH TWICE DAILY BEFORE A MEAL   Erleada 60 MG tablet Generic drug: apalutamide TAKE 4 TABLETS (240 MG TOTAL) BY MOUTH DAILY. MAY BE TAKEN WITH OR WITHOUT FOOD. SWALLOW TABLETS WHOLE.   fentaNYL 100 MCG/HR Commonly known as: DURAGESIC Place 2 patches onto the skin every 3 (three) days.   gabapentin 800 MG tablet Commonly known as: NEURONTIN Take 800 mg by mouth 3 (three) times daily.   megestrol 625 MG/5ML suspension Commonly known as: MEGACE ES Take 5 mLs (625 mg total) by  mouth 2 (two) times daily.   metoprolol tartrate 25 MG tablet Commonly known as: LOPRESSOR TAKE 1/2 TABLET(12.5 MG) BY MOUTH TWICE DAILY   Narcan 4 MG/0.1ML Liqd nasal spray kit Generic drug: naloxone NAR REP ALN   ondansetron 4 MG disintegrating tablet Commonly known as: Zofran ODT Take 1 tablet (4 mg total) by mouth every 8 (eight) hours as needed for nausea or vomiting.   ondansetron 4 MG tablet Commonly known as: ZOFRAN TAKE 1 TABLET (4 MG TOTAL) BY MOUTH EVERY 6 (SIX) HOURS AS NEEDED FOR NAUSEA OR VOMITING.   oxyCODONE-acetaminophen 10-325 MG tablet Commonly known as: Percocet Take 1 tablet by mouth every 4 (four) hours as needed for pain.   tamsulosin 0.4 MG Caps capsule Commonly known as: FLOMAX Take 1 capsule (0.4 mg total) by mouth daily.       Allergies:  Allergies  Allergen Reactions  . Contrast Media [Iodinated Diagnostic Agents] Nausea And Vomiting    Patient vomits immediately after IV contrast is injected just prior to start of scan, was unable to scan patient in correct time frame.  Patient did not warn me of this when dosing the patient with oral CM and asking the patient history questions until the patient was on the table for the CT scan just prior to injection.  Ct Tech:  Karl Bales 07/29/17   . Other Nausea And Vomiting    Patient vomits immediately after IV contrast is injected just prior to start of scan, was unable to scan patient in correct time frame.  Patient did not warn me of this when dosing the patient with oral CM and asking the patient history questions until the patient was on the table for the CT scan just prior to injection.  Ct Tech:  Karl Bales 07/29/17     Past Medical History, Surgical history, Social history, and Family History were reviewed and updated.  Review of Systems: All other 10 point review of systems is negative.   Physical Exam:  height is 6' 2"  (1.88 m) and weight is 173 lb 8 oz (78.7 kg). His oral temperature is  97.9 F (36.6 C). His blood pressure is 105/71 and his pulse is 75. His respiration is 18 and oxygen saturation is 100%.   Wt Readings from Last 3 Encounters:  04/11/20 173 lb 8 oz (78.7 kg)  01/11/20 172 lb 12.8 oz (78.4 kg)  10/12/19 168 lb 4 oz (76.3 kg)    Ocular: Sclerae unicteric, pupils equal, round and reactive to light Ear-nose-throat: Oropharynx clear, dentition fair Lymphatic: No cervical or supraclavicular adenopathy Lungs no rales or rhonchi, good excursion bilaterally Heart regular rate and rhythm, no murmur appreciated Abd soft, nontender, positive bowel sounds MSK no focal spinal tenderness, no joint edema Neuro: non-focal, well-oriented, appropriate affect Breasts: Deferred   Lab Results  Component  Value Date   WBC 9.1 04/11/2020   HGB 13.6 04/11/2020   HCT 41.2 04/11/2020   MCV 91.6 04/11/2020   PLT 143 (L) 04/11/2020   Lab Results  Component Value Date   FERRITIN 216 11/04/2017   IRON 77 11/04/2017   TIBC 261 11/04/2017   UIBC 184 11/04/2017   IRONPCTSAT 30 (L) 11/04/2017   Lab Results  Component Value Date   RBC 4.50 04/11/2020   Lab Results  Component Value Date   KAPLAMBRATIO 0.89 03/28/2016   Lab Results  Component Value Date   IGGSERUM 1,066 03/28/2016   IGMSERUM 106 03/28/2016   No results found for: Odetta Pink, SPEI   Chemistry      Component Value Date/Time   NA 138 04/11/2020 1031   NA 143 12/16/2016 1022   K 4.1 04/11/2020 1031   K 3.5 12/16/2016 1022   CL 100 04/11/2020 1031   CL 101 12/16/2016 1022   CO2 31 04/11/2020 1031   CO2 33 12/16/2016 1022   BUN 9 04/11/2020 1031   BUN 4 (L) 12/16/2016 1022   CREATININE 0.93 04/11/2020 1031   CREATININE 0.8 12/16/2016 1022      Component Value Date/Time   CALCIUM 9.3 04/11/2020 1031   CALCIUM 9.4 12/16/2016 1022   ALKPHOS 84 04/11/2020 1031   ALKPHOS 77 12/16/2016 1022   AST 10 (L) 04/11/2020 1031   ALT 8 04/11/2020  1031   ALT 16 12/16/2016 1022   BILITOT 0.2 (L) 04/11/2020 1031       Impression and Plan: Mr. Rybicki is a very pleasant 60 yo African American gentleman with metastatic prostate cancer.  Testosterone level and PSA redrawn today. Results are pending.  He received Lupron and Zometa today as planned.  He will continue his same regimen with Erleada.  Follow-up in 3 months.  He was encouraged to contact our office with any questions or concerns.   Laverna Peace, NP 3/16/202211:26 AM

## 2020-04-11 NOTE — Patient Instructions (Signed)
Zoledronic Acid Injection (Hypercalcemia, Oncology) What is this medicine? ZOLEDRONIC ACID (ZOE le dron ik AS id) slows calcium loss from bones. It high calcium levels in the blood from some kinds of cancer. It may be used in other people at risk for bone loss. This medicine may be used for other purposes; ask your health care provider or pharmacist if you have questions. COMMON BRAND NAME(S): Zometa What should I tell my health care provider before I take this medicine? They need to know if you have any of these conditions:  cancer  dehydration  dental disease  kidney disease  liver disease  low levels of calcium in the blood  lung or breathing disease (asthma)  receiving steroids like dexamethasone or prednisone  an unusual or allergic reaction to zoledronic acid, other medicines, foods, dyes, or preservatives  pregnant or trying to get pregnant  breast-feeding How should I use this medicine? This drug is injected into a vein. It is given by a health care provider in a hospital or clinic setting. Talk to your health care provider about the use of this drug in children. Special care may be needed. Overdosage: If you think you have taken too much of this medicine contact a poison control center or emergency room at once. NOTE: This medicine is only for you. Do not share this medicine with others. What if I miss a dose? Keep appointments for follow-up doses. It is important not to miss your dose. Call your health care provider if you are unable to keep an appointment. What may interact with this medicine?  certain antibiotics given by injection  NSAIDs, medicines for pain and inflammation, like ibuprofen or naproxen  some diuretics like bumetanide, furosemide  teriparatide  thalidomide This list may not describe all possible interactions. Give your health care provider a list of all the medicines, herbs, non-prescription drugs, or dietary supplements you use. Also tell  them if you smoke, drink alcohol, or use illegal drugs. Some items may interact with your medicine. What should I watch for while using this medicine? Visit your health care provider for regular checks on your progress. It may be some time before you see the benefit from this drug. Some people who take this drug have severe bone, joint, or muscle pain. This drug may also increase your risk for jaw problems or a broken thigh bone. Tell your health care provider right away if you have severe pain in your jaw, bones, joints, or muscles. Tell you health care provider if you have any pain that does not go away or that gets worse. Tell your dentist and dental surgeon that you are taking this drug. You should not have major dental surgery while on this drug. See your dentist to have a dental exam and fix any dental problems before starting this drug. Take good care of your teeth while on this drug. Make sure you see your dentist for regular follow-up appointments. You should make sure you get enough calcium and vitamin D while you are taking this drug. Discuss the foods you eat and the vitamins you take with your health care provider. Check with your health care provider if you have severe diarrhea, nausea, and vomiting, or if you sweat a lot. The loss of too much body fluid may make it dangerous for you to take this drug. You may need blood work done while you are taking this drug. Do not become pregnant while taking this drug. Women should inform their health care provider   if they wish to become pregnant or think they might be pregnant. There is potential for serious harm to an unborn child. Talk to your health care provider for more information. What side effects may I notice from receiving this medicine? Side effects that you should report to your doctor or health care provider as soon as possible:  allergic reactions (skin rash, itching or hives; swelling of the face, lips, or tongue)  bone  pain  infection (fever, chills, cough, sore throat, pain or trouble passing urine)  jaw pain, especially after dental work  joint pain  kidney injury (trouble passing urine or change in the amount of urine)  low blood pressure (dizziness; feeling faint or lightheaded, falls; unusually weak or tired)  low calcium levels (fast heartbeat; muscle cramps or pain; pain, tingling, or numbness in the hands or feet; seizures)  low magnesium levels (fast, irregular heartbeat; muscle cramp or pain; muscle weakness; tremors; seizures)  low red blood cell counts (trouble breathing; feeling faint; lightheaded, falls; unusually weak or tired)  muscle pain  redness, blistering, peeling, or loosening of the skin, including inside the mouth  severe diarrhea  swelling of the ankles, feet, hands  trouble breathing Side effects that usually do not require medical attention (report to your doctor or health care provider if they continue or are bothersome):  anxious  constipation  coughing  depressed mood  eye irritation, itching, or pain  fever  general ill feeling or flu-like symptoms  nausea  pain, redness, or irritation at site where injected  trouble sleeping This list may not describe all possible side effects. Call your doctor for medical advice about side effects. You may report side effects to FDA at 1-800-FDA-1088. Where should I keep my medicine? This drug is given in a hospital or clinic. It will not be stored at home. NOTE: This sheet is a summary. It may not cover all possible information. If you have questions about this medicine, talk to your doctor, pharmacist, or health care provider.  2021 Elsevier/Gold Standard (2018-10-28 09:13:00) Leuprolide depot injection What is this medicine? LEUPROLIDE (loo PROE lide) is a man-made protein that acts like a natural hormone in the body. It decreases testosterone in men and decreases estrogen in women. In men, this medicine is  used to treat advanced prostate cancer. In women, some forms of this medicine may be used to treat endometriosis, uterine fibroids, or other male hormone-related problems. This medicine may be used for other purposes; ask your health care provider or pharmacist if you have questions. COMMON BRAND NAME(S): Eligard, Fensolv, Lupron Depot, Lupron Depot-Ped, Viadur What should I tell my health care provider before I take this medicine? They need to know if you have any of these conditions:  diabetes  heart disease or previous heart attack  high blood pressure  high cholesterol  mental illness  osteoporosis  pain or difficulty passing urine  seizures  spinal cord metastasis  stroke  suicidal thoughts, plans, or attempt; a previous suicide attempt by you or a family member  tobacco smoker  unusual vaginal bleeding (women)  an unusual or allergic reaction to leuprolide, benzyl alcohol, other medicines, foods, dyes, or preservatives  pregnant or trying to get pregnant  breast-feeding How should I use this medicine? This medicine is for injection into a muscle or for injection under the skin. It is given by a health care professional in a hospital or clinic setting. The specific product will determine how it will be given to you.  Make sure you understand which product you receive and how often you will receive it. Talk to your pediatrician regarding the use of this medicine in children. Special care may be needed. Overdosage: If you think you have taken too much of this medicine contact a poison control center or emergency room at once. NOTE: This medicine is only for you. Do not share this medicine with others. What if I miss a dose? It is important not to miss a dose. Call your doctor or health care professional if you are unable to keep an appointment. Depot injections: Depot injections are given either once-monthly, every 12 weeks, every 16 weeks, or every 24 weeks depending  on the product you are prescribed. The product you are prescribed will be based on if you are male or male, and your condition. Make sure you understand your product and dosing. What may interact with this medicine? Do not take this medicine with any of the following medications:  chasteberry  cisapride  dronedarone  pimozide  thioridazine This medicine may also interact with the following medications:  herbal or dietary supplements, like black cohosh or DHEA  male hormones, like estrogens or progestins and birth control pills, patches, rings, or injections  male hormones, like testosterone  other medicines that prolong the QT interval (abnormal heart rhythm) This list may not describe all possible interactions. Give your health care provider a list of all the medicines, herbs, non-prescription drugs, or dietary supplements you use. Also tell them if you smoke, drink alcohol, or use illegal drugs. Some items may interact with your medicine. What should I watch for while using this medicine? Visit your doctor or health care professional for regular checks on your progress. During the first weeks of treatment, your symptoms may get worse, but then will improve as you continue your treatment. You may get hot flashes, increased bone pain, increased difficulty passing urine, or an aggravation of nerve symptoms. Discuss these effects with your doctor or health care professional, some of them may improve with continued use of this medicine. Male patients may experience a menstrual cycle or spotting during the first months of therapy with this medicine. If this continues, contact your doctor or health care professional. This medicine may increase blood sugar. Ask your healthcare provider if changes in diet or medicines are needed if you have diabetes. What side effects may I notice from receiving this medicine? Side effects that you should report to your doctor or health care professional  as soon as possible:  allergic reactions like skin rash, itching or hives, swelling of the face, lips, or tongue  breathing problems  chest pain  depression or memory disorders  pain in your legs or groin  pain at site where injected or implanted  seizures  severe headache  signs and symptoms of high blood sugar such as being more thirsty or hungry or having to urinate more than normal. You may also feel very tired or have blurry vision  swelling of the feet and legs  suicidal thoughts or other mood changes  visual changes  vomiting Side effects that usually do not require medical attention (report to your doctor or health care professional if they continue or are bothersome):  breast swelling or tenderness  decrease in sex drive or performance  diarrhea  hot flashes  loss of appetite  muscle, joint, or bone pains  nausea  redness or irritation at site where injected or implanted  skin problems or acne This list may not  describe all possible side effects. Call your doctor for medical advice about side effects. You may report side effects to FDA at 1-800-FDA-1088. Where should I keep my medicine? This drug is given in a hospital or clinic and will not be stored at home. NOTE: This sheet is a summary. It may not cover all possible information. If you have questions about this medicine, talk to your doctor, pharmacist, or health care provider.  2021 [employer]/Gold Standard (2018-12-15 10:35:13)

## 2020-04-12 LAB — TESTOSTERONE: Testosterone: <3 ng/dL — ABNORMAL LOW (ref 264–916)

## 2020-04-12 LAB — PSA, TOTAL AND FREE
PSA, Free Pct: 12.5 %
PSA, Free: 0.89 ng/mL
Prostate Specific Ag, Serum: 7.1 ng/mL — ABNORMAL HIGH (ref 0.0–4.0)

## 2020-04-17 MED FILL — ERLEADA 60 MG TABS: 60 | 30 days supply | Qty: 120 | Fill #3

## 2020-04-24 ENCOUNTER — Other Ambulatory Visit (HOSPITAL_COMMUNITY): Payer: Self-pay

## 2020-05-07 ENCOUNTER — Other Ambulatory Visit: Payer: Self-pay | Admitting: *Deleted

## 2020-05-07 DIAGNOSIS — R52 Pain, unspecified: Secondary | ICD-10-CM

## 2020-05-07 DIAGNOSIS — G8929 Other chronic pain: Secondary | ICD-10-CM

## 2020-05-07 DIAGNOSIS — C61 Malignant neoplasm of prostate: Secondary | ICD-10-CM

## 2020-05-07 DIAGNOSIS — M5442 Lumbago with sciatica, left side: Secondary | ICD-10-CM

## 2020-05-07 DIAGNOSIS — C7951 Secondary malignant neoplasm of bone: Secondary | ICD-10-CM

## 2020-05-07 MED ORDER — OXYCODONE-ACETAMINOPHEN 10-325 MG PO TABS: 1.0000 | ORAL_TABLET | ORAL | 0 refills | Status: DC | PRN

## 2020-05-07 MED ORDER — FENTANYL 100 MCG/HR TD PT72: 2.0000 | MEDICATED_PATCH | TRANSDERMAL | 0 refills | Status: DC

## 2020-05-14 ENCOUNTER — Other Ambulatory Visit (HOSPITAL_COMMUNITY): Payer: Self-pay

## 2020-05-14 ENCOUNTER — Other Ambulatory Visit: Payer: Self-pay | Admitting: Hematology & Oncology

## 2020-05-14 MED FILL — Apalutamide Tab 60 MG: ORAL | 30 days supply | Qty: 120 | Fill #0 | Status: AC

## 2020-05-17 ENCOUNTER — Other Ambulatory Visit (HOSPITAL_COMMUNITY): Payer: Self-pay

## 2020-06-04 ENCOUNTER — Other Ambulatory Visit: Payer: Self-pay | Admitting: *Deleted

## 2020-06-04 DIAGNOSIS — C7951 Secondary malignant neoplasm of bone: Secondary | ICD-10-CM

## 2020-06-04 DIAGNOSIS — G8929 Other chronic pain: Secondary | ICD-10-CM

## 2020-06-04 DIAGNOSIS — M5442 Lumbago with sciatica, left side: Secondary | ICD-10-CM

## 2020-06-04 DIAGNOSIS — R52 Pain, unspecified: Secondary | ICD-10-CM

## 2020-06-04 DIAGNOSIS — C61 Malignant neoplasm of prostate: Secondary | ICD-10-CM

## 2020-06-04 MED ORDER — FENTANYL 100 MCG/HR TD PT72: 2.0000 | MEDICATED_PATCH | TRANSDERMAL | 0 refills | Status: DC

## 2020-06-04 MED ORDER — OXYCODONE-ACETAMINOPHEN 10-325 MG PO TABS: 1.0000 | ORAL_TABLET | ORAL | 0 refills | Status: DC | PRN

## 2020-06-12 ENCOUNTER — Other Ambulatory Visit (HOSPITAL_COMMUNITY): Payer: Self-pay

## 2020-06-20 ENCOUNTER — Other Ambulatory Visit (HOSPITAL_COMMUNITY): Payer: Self-pay

## 2020-06-20 MED FILL — Apalutamide Tab 60 MG: ORAL | 30 days supply | Qty: 120 | Fill #1 | Status: AC

## 2020-06-25 ENCOUNTER — Other Ambulatory Visit: Payer: Self-pay | Admitting: Family

## 2020-06-25 DIAGNOSIS — C61 Malignant neoplasm of prostate: Secondary | ICD-10-CM

## 2020-06-30 ENCOUNTER — Emergency Department (HOSPITAL_BASED_OUTPATIENT_CLINIC_OR_DEPARTMENT_OTHER)
Admission: EM | Admit: 2020-06-30 | Discharge: 2020-06-30 | Disposition: A | Discharge status: Home / Self Care | Payer: Medicare Other | Attending: Emergency Medicine | Admitting: Emergency Medicine

## 2020-06-30 ENCOUNTER — Encounter (HOSPITAL_BASED_OUTPATIENT_CLINIC_OR_DEPARTMENT_OTHER): Payer: Self-pay | Admitting: Emergency Medicine

## 2020-06-30 ENCOUNTER — Other Ambulatory Visit: Payer: Self-pay

## 2020-06-30 DIAGNOSIS — Z85830 Personal history of malignant neoplasm of bone: Secondary | ICD-10-CM | POA: Diagnosis not present

## 2020-06-30 DIAGNOSIS — F17210 Nicotine dependence, cigarettes, uncomplicated: Secondary | ICD-10-CM | POA: Insufficient documentation

## 2020-06-30 DIAGNOSIS — R1084 Generalized abdominal pain: Secondary | ICD-10-CM | POA: Insufficient documentation

## 2020-06-30 DIAGNOSIS — M549 Dorsalgia, unspecified: Secondary | ICD-10-CM | POA: Insufficient documentation

## 2020-06-30 DIAGNOSIS — Z7982 Long term (current) use of aspirin: Secondary | ICD-10-CM | POA: Diagnosis not present

## 2020-06-30 DIAGNOSIS — Z8546 Personal history of malignant neoplasm of prostate: Secondary | ICD-10-CM | POA: Diagnosis not present

## 2020-06-30 DIAGNOSIS — R109 Unspecified abdominal pain: Secondary | ICD-10-CM | POA: Diagnosis present

## 2020-06-30 LAB — CBC WITH DIFFERENTIAL/PLATELET
Abs Immature Granulocytes: 0.01 10*3/uL (ref 0.00–0.07)
Basophils Absolute: 0 10*3/uL (ref 0.0–0.1)
Basophils Relative: 0 %
Eosinophils Absolute: 0.3 10*3/uL (ref 0.0–0.5)
Eosinophils Relative: 3 %
HCT: 40.2 % (ref 39.0–52.0)
Hemoglobin: 13.7 g/dL (ref 13.0–17.0)
Immature Granulocytes: 0 %
Lymphocytes Relative: 16 %
Lymphs Abs: 1.4 10*3/uL (ref 0.7–4.0)
MCH: 30.9 pg (ref 26.0–34.0)
MCHC: 34.1 g/dL (ref 30.0–36.0)
MCV: 90.7 fL (ref 80.0–100.0)
Monocytes Absolute: 0.4 10*3/uL (ref 0.1–1.0)
Monocytes Relative: 5 %
Neutro Abs: 6.8 10*3/uL (ref 1.7–7.7)
Neutrophils Relative %: 76 %
Platelets: 140 10*3/uL — ABNORMAL LOW (ref 150–400)
RBC: 4.43 MIL/uL (ref 4.22–5.81)
RDW: 13.9 % (ref 11.5–15.5)
WBC: 8.9 10*3/uL (ref 4.0–10.5)
nRBC: 0 % (ref 0.0–0.2)

## 2020-06-30 LAB — COMPREHENSIVE METABOLIC PANEL
ALT: 9 U/L (ref 0–44)
AST: 14 U/L — ABNORMAL LOW (ref 15–41)
Albumin: 3.7 g/dL (ref 3.5–5.0)
Alkaline Phosphatase: 79 U/L (ref 38–126)
Anion gap: 6 (ref 5–15)
BUN: 8 mg/dL (ref 6–20)
CO2: 26 mmol/L (ref 22–32)
Calcium: 8.6 mg/dL — ABNORMAL LOW (ref 8.9–10.3)
Chloride: 103 mmol/L (ref 98–111)
Creatinine, Ser: 0.82 mg/dL (ref 0.61–1.24)
GFR, Estimated: >60 mL/min (ref >60)
Glucose, Bld: 116 mg/dL — ABNORMAL HIGH (ref 70–99)
Potassium: 4.4 mmol/L (ref 3.5–5.1)
Sodium: 135 mmol/L (ref 135–145)
Total Bilirubin: 0.2 mg/dL — ABNORMAL LOW (ref 0.3–1.2)
Total Protein: 6.5 g/dL (ref 6.5–8.1)

## 2020-06-30 LAB — URINALYSIS, ROUTINE W REFLEX MICROSCOPIC
Bilirubin Urine: NEGATIVE
Glucose, UA: NEGATIVE mg/dL
Hgb urine dipstick: NEGATIVE
Ketones, ur: NEGATIVE mg/dL
Leukocytes,Ua: NEGATIVE
Nitrite: NEGATIVE
Protein, ur: NEGATIVE mg/dL
Specific Gravity, Urine: 1.01 (ref 1.005–1.030)
pH: 8.5 — ABNORMAL HIGH (ref 5.0–8.0)

## 2020-06-30 LAB — LIPASE, BLOOD: Lipase: 20 U/L (ref 11–51)

## 2020-06-30 MED ORDER — OXYCODONE-ACETAMINOPHEN 5-325 MG PO TABS: 1.0000 | ORAL_TABLET | Freq: Once | ORAL | Status: DC

## 2020-06-30 NOTE — ED Provider Notes (Addendum)
[facility] EMERGENCY DEPARTMENT Provider Note   CSN: [id number] Arrival date & time: 06/30/20  1117     History   Ryan Davis is a 60 y.o. male.  Patient with history of metastatic prostate cancer, presents chief complaint of "I feel dehydrated and my stomach feels funny."  Triage notation states abdominal pain and back pain, however when I speak with him he states that is not pain is much as it feels "funny".  Symptoms been ongoing for the past 2 to 3 days.  He denies fevers or cough.  He denies vomiting or diarrhea.  States that he has not been drinking enough fluids because he does not feel thirsty and now feels very dehydrated.        Past Medical History:  Diagnosis Date  . A-fib [facility])    see 03-28-16 admission  . Back pain without radiculopathy 03/28/2016  . Dyspnea    endorses since treatment radiation , still gets SOB occasionally   . Dysrhythmia    afib   . Goals of care, counseling/discussion 06/24/2017  . History of radiation therapy 09/16/16-10/06/16   35 Gy in 14 fractions to the lumbar spine  . Nocturia   . Prostate cancer metastatic to bone ([place]) 03/31/2016  . Prostate cancer metastatic to multiple sites [facility]) 03/31/2016    Patient Active Problem List   Diagnosis Date Noted  . Goals of care, counseling/discussion 06/24/2017  . 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[place]) 03/31/2016  . Prostate cancer metastatic to bone ([place]) 03/31/2016  . Atrial fibrillation ([place]) 03/30/2016  . Back pain without radiculopathy 03/28/2016  . Atrial fibrillation with RVR ([place]) 03/28/2016    Past Surgical History:  Procedure Laterality Date  . MULTIPLE EXTRACTIONS WITH ALVEOLOPLASTY N/A 11/19/2016   Procedure: EXTRACTION OF TOOTH #'S 1,3,5-7,9-17, AND 20- 29 WITH ALVEOLOPLASTY, BILATERAL MANDIBULAR TORI REDUCTIONS AND BILATERAL MAXILLARY BUCCAL EXOSTOSES REDUCTIONS;  Surgeon: Lenn Cal, DDS;  Location: [facility] ORS;  Service: Oral Surgery;  Laterality: N/A;   . NO PAST SURGERIES         Family History  Problem Relation Age of Onset  . Stroke Sister   . Lupus Mother   . Cancer Neg Hx   . Heart disease Neg Hx   . Diabetes Neg Hx     Social History   Tobacco Use  . Smoking status: Current Some Day Smoker    Packs/day: 0.50    Years: 39.00    Pack years: 19.50    Types: Cigarettes    Last attempt to quit: 10/13/2016    Years since quitting: 3.7  . Smokeless tobacco: Never Used  Vaping Use  . Vaping Use: Never used  Substance Use Topics  . Alcohol use: Not Currently  . Drug use: No    Home Medications Prior to Admission medications   Medication Sig Start Date End Date Taking? Authorizing Provider  acetaminophen (TYLENOL) 500 MG tablet Take by mouth. 08/25/18   [provider]  apalutamide (ERLEADA) 60 MG tablet TAKE 4 TABLETS (240 MG TOTAL) BY MOUTH DAILY. MAY BE TAKEN WITH OR WITHOUT FOOD. SWALLOW TABLETS WHOLE. 01/17/20 01/16/21  [name], MD  aspirin 81 MG chewable tablet Chew 1 tablet by mouth daily. 04/09/17   [provider]  atorvastatin (LIPITOR) 10 MG tablet TAKE 1 TABLET(10 MG) BY MOUTH DAILY 03/27/20   [provider]  Calcium Carbonate-Vitamin D (CALCIUM-VITAMIN D3 PO) Take 1 tablet by mouth daily.    [provider]  diltiazem (CARDIZEM CD) 240 MG 24 hr capsule TAKE 1 CAPSULE BY MOUTH EVERY DAY 05/14/20   [name], MD  dronabinol (MARINOL) 5 MG capsule TAKE 1 CAPSULE(5 MG) BY MOUTH TWICE DAILY BEFORE A MEAL 03/18/19   [place], Holli Humbles, NP  fentaNYL (DURAGESIC) 100 MCG/HR Place 2 patches onto the skin every 3 (three) days. 06/04/20   [name], MD  gabapentin (NEURONTIN) 800 MG tablet Take 800 mg by mouth 3 (three) times daily. 06/30/19   [provider]  megestrol (MEGACE ES) 625 MG/5ML suspension Take 5 mLs (625 mg total) by mouth 2 (two) times daily. 03/24/18   [name], MD  metoprolol tartrate (LOPRESSOR) 25 MG tablet TAKE 1/2 TABLET(12.5 MG) BY  MOUTH TWICE DAILY 03/27/20   [name], MD  NARCAN 4 MG/0.1ML LIQD nasal spray kit NAR REP ALN Patient not taking: No sig reported 02/19/18   [provider]  ondansetron (ZOFRAN ODT) 4 MG disintegrating tablet Take 1 tablet (4 mg total) by mouth every 8 (eight) hours as needed for nausea or vomiting. 10/15/18   Quintella Reichert, MD  ondansetron (ZOFRAN) 4 MG tablet TAKE 1 TABLET (4 MG TOTAL) BY MOUTH EVERY 6 (SIX) HOURS AS NEEDED FOR NAUSEA OR VOMITING. 02/19/18   [place], Holli Humbles, NP  oxyCODONE-acetaminophen (PERCOCET) 10-325 MG tablet Take 1 tablet by mouth every 4 (four) hours as needed for pain. 06/04/20   [name], MD  tamsulosin (FLOMAX) 0.4 MG CAPS capsule TAKE ONE CAPSULE BY MOUTH EVERY DAY 06/25/20   [name], MD    Allergies    Contrast media [iodinated diagnostic agents] and Other  Review of Systems   Review of Systems  Constitutional: Negative for fever.  HENT: Negative for ear pain and sore throat.   Eyes: Negative for pain.  Respiratory: Negative for cough.   Cardiovascular: Negative for chest pain.  Gastrointestinal: Negative for diarrhea and vomiting.  Genitourinary: Negative for flank pain.  Musculoskeletal: Negative for back pain.  Skin: Negative for color change and rash.  Neurological: Negative for syncope.  All other systems reviewed and are negative.   Physical Exam Updated Vital Signs BP (!) 142/88 (BP Location: Right Arm)   Pulse 72   Temp 98.1 F (36.7 C) (Oral)   Resp 15   Ht 6' 2" (1.88 m)   Wt 74.4 kg   SpO2 97%   BMI 21.06 kg/m   Physical Exam Constitutional:      General: He is not in acute distress.    Appearance: He is well-developed.  HENT:     Head: Normocephalic.     Nose: Nose normal.  Eyes:     Extraocular Movements: Extraocular movements intact.  Cardiovascular:     Rate and Rhythm: Normal rate.  Pulmonary:     Effort: Pulmonary effort is normal.  Abdominal:     Tenderness: There is no abdominal  tenderness. There is no guarding or rebound.  Musculoskeletal:     Comments: No C or T or L-spine midline step-offs or tenderness noted.  Skin:    Coloration: Skin is not jaundiced.  Neurological:     General: No focal deficit present.     Mental Status: He is alert. Mental status is at baseline.     Cranial Nerves: No cranial nerve deficit.     Sensory: No sensory deficit.     Motor: No weakness.     Gait: Gait normal.     ED Results /  Procedures / Treatments   Labs (all labs ordered are listed, but only abnormal results are displayed) Labs Reviewed  CBC WITH DIFFERENTIAL/PLATELET - Abnormal; Notable for the following components:      Result Value   Platelets 140 (*)    All other components within normal limits  COMPREHENSIVE METABOLIC PANEL - Abnormal; Notable for the following components:   Glucose, Bld 116 (*)    Calcium 8.6 (*)    AST 14 (*)    Total Bilirubin 0.2 (*)    All other components within normal limits  URINALYSIS, ROUTINE W REFLEX MICROSCOPIC - Abnormal; Notable for the following components:   APPearance HAZY (*)    pH 8.5 (*)    All other components within normal limits  LIPASE, BLOOD    EKG None  Radiology No results found.  Procedures Procedures   Medications Ordered in ED Medications - No data to display  ED Course  I have reviewed the triage vital signs and the nursing notes.  Pertinent labs & imaging results that were available during my care of the patient were reviewed by me and considered in my medical decision making (see chart for details).    MDM Rules/Calculators/A&P                          Clinically the patient appears comfortable no acute distress.  He does not have any abdominal tenderness or lower back tenderness on exam, has a normal nonantalgic gait.  Given his comorbidities, labs are sent and IV fluids ordered.   Serial abdominal exams are done however he has no abdominal tenderness throughout his ER stay no guarding or  rebound noted.  Labs otherwise appear at baseline with no acute findings.  Etiology of his symptoms unclear urinalysis otherwise unremarkable as well.  Recommend he follow-up with his oncologist within the week, recommending immediate return for worsening pain fevers or any additional concerns.  Final Clinical Impression(s) / ED Diagnoses Final diagnoses:  Generalized abdominal pain    Rx / DC Orders ED Discharge Orders    None       Luna Fuse, MD 06/30/20 1456    Luna Fuse, MD 06/30/20 1500

## 2020-06-30 NOTE — Discharge Instructions (Addendum)
Call your primary care doctor or specialist as discussed in the next 2-3 days.   Return immediately back to the ER if:  Your symptoms worsen within the next 12-24 hours. You develop new symptoms such as new fevers, persistent vomiting, new pain, shortness of breath, or new weakness or numbness, or if you have any other concerns.  

## 2020-06-30 NOTE — ED Triage Notes (Signed)
Pt reports lower back pain w/ chills x 3d; also reports middle upper abd pain; denies NVD

## 2020-07-05 ENCOUNTER — Other Ambulatory Visit: Payer: Self-pay | Admitting: *Deleted

## 2020-07-05 DIAGNOSIS — M5442 Lumbago with sciatica, left side: Secondary | ICD-10-CM

## 2020-07-05 DIAGNOSIS — R52 Pain, unspecified: Secondary | ICD-10-CM

## 2020-07-05 DIAGNOSIS — C7951 Secondary malignant neoplasm of bone: Secondary | ICD-10-CM

## 2020-07-05 DIAGNOSIS — C61 Malignant neoplasm of prostate: Secondary | ICD-10-CM

## 2020-07-05 DIAGNOSIS — G8929 Other chronic pain: Secondary | ICD-10-CM

## 2020-07-05 MED ORDER — FENTANYL 100 MCG/HR TD PT72: 2.0000 | MEDICATED_PATCH | TRANSDERMAL | 0 refills | Status: DC

## 2020-07-05 MED ORDER — OXYCODONE-ACETAMINOPHEN 10-325 MG PO TABS: 1.0000 | ORAL_TABLET | ORAL | 0 refills | Status: DC | PRN

## 2020-07-12 ENCOUNTER — Other Ambulatory Visit: Payer: Medicare Other

## 2020-07-12 ENCOUNTER — Telehealth: Payer: Self-pay

## 2020-07-12 ENCOUNTER — Encounter: Payer: Self-pay | Admitting: Hematology & Oncology

## 2020-07-12 ENCOUNTER — Inpatient Hospital Stay: Payer: Medicare Other

## 2020-07-12 ENCOUNTER — Ambulatory Visit: Payer: Medicare Other | Admitting: Hematology & Oncology

## 2020-07-12 ENCOUNTER — Inpatient Hospital Stay: Payer: Medicare Other | Attending: Hematology & Oncology

## 2020-07-12 ENCOUNTER — Inpatient Hospital Stay (HOSPITAL_BASED_OUTPATIENT_CLINIC_OR_DEPARTMENT_OTHER): Payer: Medicare Other | Admitting: Hematology & Oncology

## 2020-07-12 ENCOUNTER — Ambulatory Visit: Payer: Medicare Other

## 2020-07-12 ENCOUNTER — Other Ambulatory Visit: Payer: Self-pay

## 2020-07-12 VITALS — BP 136/79 | HR 89 | Temp 98.6°F | Resp 16 | Wt 162.0 lb

## 2020-07-12 DIAGNOSIS — C7951 Secondary malignant neoplasm of bone: Secondary | ICD-10-CM

## 2020-07-12 DIAGNOSIS — Z5111 Encounter for antineoplastic chemotherapy: Secondary | ICD-10-CM | POA: Insufficient documentation

## 2020-07-12 DIAGNOSIS — C61 Malignant neoplasm of prostate: Secondary | ICD-10-CM

## 2020-07-12 LAB — CBC WITH DIFFERENTIAL (CANCER CENTER ONLY)
Abs Immature Granulocytes: 0.03 10*3/uL (ref 0.00–0.07)
Basophils Absolute: 0.1 10*3/uL (ref 0.0–0.1)
Basophils Relative: 1 %
Eosinophils Absolute: 0.4 10*3/uL (ref 0.0–0.5)
Eosinophils Relative: 4 %
HCT: 44.1 % (ref 39.0–52.0)
Hemoglobin: 14.7 g/dL (ref 13.0–17.0)
Immature Granulocytes: 0 %
Lymphocytes Relative: 23 %
Lymphs Abs: 2.1 10*3/uL (ref 0.7–4.0)
MCH: 30.5 pg (ref 26.0–34.0)
MCHC: 33.3 g/dL (ref 30.0–36.0)
MCV: 91.5 fL (ref 80.0–100.0)
Monocytes Absolute: 0.6 10*3/uL (ref 0.1–1.0)
Monocytes Relative: 6 %
Neutro Abs: 5.9 10*3/uL (ref 1.7–7.7)
Neutrophils Relative %: 66 %
Platelet Count: 146 10*3/uL — ABNORMAL LOW (ref 150–400)
RBC: 4.82 MIL/uL (ref 4.22–5.81)
RDW: 14.3 % (ref 11.5–15.5)
WBC Count: 9 10*3/uL (ref 4.0–10.5)
nRBC: 0 % (ref 0.0–0.2)

## 2020-07-12 LAB — CMP (CANCER CENTER ONLY)
ALT: 12 U/L (ref 0–44)
AST: 15 U/L (ref 15–41)
Albumin: 4.5 g/dL (ref 3.5–5.0)
Alkaline Phosphatase: 106 U/L (ref 38–126)
Anion gap: 7 (ref 5–15)
BUN: 6 mg/dL (ref 6–20)
CO2: 30 mmol/L (ref 22–32)
Calcium: 9.7 mg/dL (ref 8.9–10.3)
Chloride: 102 mmol/L (ref 98–111)
Creatinine: 0.91 mg/dL (ref 0.61–1.24)
GFR, Estimated: >60 mL/min (ref >60)
Glucose, Bld: 116 mg/dL — ABNORMAL HIGH (ref 70–99)
Potassium: 4.1 mmol/L (ref 3.5–5.1)
Sodium: 139 mmol/L (ref 135–145)
Total Bilirubin: 0.4 mg/dL (ref 0.3–1.2)
Total Protein: 6.7 g/dL (ref 6.5–8.1)

## 2020-07-12 MED ORDER — LEUPROLIDE ACETATE (3 MONTH) 22.5 MG ~~LOC~~ KIT
22.5000 mg | PACK | Freq: Once | SUBCUTANEOUS | Status: AC
  Administered 2020-07-12: 22.5 mg via SUBCUTANEOUS
  Filled 2020-07-12: qty 22.5

## 2020-07-12 MED ORDER — SODIUM CHLORIDE 0.9 % IV SOLN
Freq: Once | INTRAVENOUS | Status: AC
  Filled 2020-07-12: qty 250

## 2020-07-12 MED ORDER — ZOLEDRONIC ACID 4 MG/100ML IV SOLN
4.0000 mg | Freq: Once | INTRAVENOUS | Status: AC
  Administered 2020-07-12: 4 mg via INTRAVENOUS
  Filled 2020-07-12: qty 100

## 2020-07-12 NOTE — Patient Instructions (Signed)
Zoledronic Acid Injection (Hypercalcemia, Oncology) What is this medication? ZOLEDRONIC ACID (ZOE le dron ik AS id) slows calcium loss from bones. It high calcium levels in the blood from some kinds of cancer. It may be used in otherpeople at risk for bone loss. This medicine may be used for other purposes; ask your health care provider orpharmacist if you have questions. COMMON BRAND NAME(S): Zometa What should I tell my care team before I take this medication? They need to know if you have any of these conditions: cancer dehydration dental disease kidney disease liver disease low levels of calcium in the blood lung or breathing disease (asthma) receiving steroids like dexamethasone or prednisone an unusual or allergic reaction to zoledronic acid, other medicines, foods, dyes, or preservatives pregnant or trying to get pregnant breast-feeding How should I use this medication? This drug is injected into a vein. It is given by a health care provider in ahospital or clinic setting. Talk to your health care provider about the use of this drug in children.Special care may be needed. Overdosage: If you think you have taken too much of this medicine contact apoison control center or emergency room at once. NOTE: This medicine is only for you. Do not share this medicine with others. What if I miss a dose? Keep appointments for follow-up doses. It is important not to miss your dose.Call your health care provider if you are unable to keep an appointment. What may interact with this medication? certain antibiotics given by injection NSAIDs, medicines for pain and inflammation, like ibuprofen or naproxen some diuretics like bumetanide, furosemide teriparatide thalidomide This list may not describe all possible interactions. Give your health care provider a list of all the medicines, herbs, non-prescription drugs, or dietary supplements you use. Also tell them if you smoke, drink alcohol, or use  illegaldrugs. Some items may interact with your medicine. What should I watch for while using this medication? Visit your health care provider for regular checks on your progress. It may besome time before you see the benefit from this drug. Some people who take this drug have severe bone, joint, or muscle pain. This drug may also increase your risk for jaw problems or a broken thigh bone. Tell your health care provider right away if you have severe pain in your jaw, bones, joints, or muscles. Tell you health care provider if you have any painthat does not go away or that gets worse. Tell your dentist and dental surgeon that you are taking this drug. You should not have major dental surgery while on this drug. See your dentist to have a dental exam and fix any dental problems before starting this drug. Take good care of your teeth while on this drug. Make sure you see your dentist forregular follow-up appointments. You should make sure you get enough calcium and vitamin D while you are taking this drug. Discuss the foods you eat and the vitamins you take with your healthcare provider. Check with your health care provider if you have severe diarrhea, nausea, and vomiting, or if you sweat a lot. The loss of too much body fluid may make itdangerous for you to take this drug. You may need blood work done while you are taking this drug. Do not become pregnant while taking this drug. Women should inform their health care provider if they wish to become pregnant or think they might be pregnant. There is potential for serious harm to an unborn child. Talk to your healthcare provider for more   information. What side effects may I notice from receiving this medication? Side effects that you should report to your doctor or health care provider assoon as possible: allergic reactions (skin rash, itching or hives; swelling of the face, lips, or tongue) bone pain infection (fever, chills, cough, sore throat, pain or  trouble passing urine) jaw pain, especially after dental work joint pain kidney injury (trouble passing urine or change in the amount of urine) low blood pressure (dizziness; feeling faint or lightheaded, falls; unusually weak or tired) low calcium levels (fast heartbeat; muscle cramps or pain; pain, tingling, or numbness in the hands or feet; seizures) low magnesium levels (fast, irregular heartbeat; muscle cramp or pain; muscle weakness; tremors; seizures) low red blood cell counts (trouble breathing; feeling faint; lightheaded, falls; unusually weak or tired) muscle pain redness, blistering, peeling, or loosening of the skin, including inside the mouth severe diarrhea swelling of the ankles, feet, hands trouble breathing Side effects that usually do not require medical attention (report to yourdoctor or health care provider if they continue or are bothersome): anxious constipation coughing depressed mood eye irritation, itching, or pain fever general ill feeling or flu-like symptoms nausea pain, redness, or irritation at site where injected trouble sleeping This list may not describe all possible side effects. Call your doctor for medical advice about side effects. You may report side effects to FDA at1-800-FDA-1088. Where should I keep my medication? This drug is given in a hospital or clinic. It will not be stored at home. NOTE: This sheet is a summary. It may not cover all possible information. If you have questions about this medicine, talk to your doctor, pharmacist, orhealth care provider.  2022 Elsevier/Gold Standard (2018-10-28 09:13:00)     Leuprolide injection What is this medication? LEUPROLIDE (loo PROE lide) is a man-made hormone. It is used to treat the symptoms of prostate cancer. This medicine may also be used to treat childrenwith early onset of puberty. It may be used for other hormonal conditions. This medicine may be used for other purposes; ask your health  care provider orpharmacist if you have questions. COMMON BRAND NAME(S): Lupron What should I tell my care team before I take this medication? They need to know if you have any of these conditions: diabetes heart disease or previous heart attack high blood pressure high cholesterol pain or difficulty passing urine spinal cord metastasis stroke tobacco smoker an unusual or allergic reaction to leuprolide, benzyl alcohol, other medicines, foods, dyes, or preservatives pregnant or trying to get pregnant breast-feeding How should I use this medication? This medicine is for injection under the skin or into a muscle. You will be taught how to prepare and give this medicine. Use exactly as directed. Take your medicine at regular intervals. Do not take your medicine more often thandirected. It is important that you put your used needles and syringes in a special sharps container. Do not put them in a trash can. If you do not have a sharpscontainer, call your pharmacist or healthcare provider to get one. A special MedGuide will be given to you by the pharmacist with eachprescription and refill. Be sure to read this information carefully each time. Talk to your pediatrician regarding the use of this medicine in children. While this medicine may be prescribed for children as young as 8 years for selectedconditions, precautions do apply. Overdosage: If you think you have taken too much of this medicine contact apoison control center or emergency room at once. NOTE: This medicine is only for  you. Do not share this medicine with others. What if I miss a dose? If you miss a dose, take it as soon as you can. If it is almost time for yournext dose, take only that dose. Do not take double or extra doses. What may interact with this medication? Do not take this medicine with any of the following medications: chasteberry cisapride dronedarone pimozide thioridazine This medicine may also interact with the  following medications: herbal or dietary supplements, like black cohosh or DHEA male hormones, like estrogens or progestins and birth control pills, patches, rings, or injections male hormones, like testosterone other medicines that prolong the QT interval (abnormal heart rhythm) This list may not describe all possible interactions. Give your health care provider a list of all the medicines, herbs, non-prescription drugs, or dietary supplements you use. Also tell them if you smoke, drink alcohol, or use illegaldrugs. Some items may interact with your medicine. What should I watch for while using this medication? Visit your doctor or health care professional for regular checks on your progress. During the first week, your symptoms may get worse, but then will improve as you continue your treatment. You may get hot flashes, increased bone pain, increased difficulty passing urine, or an aggravation of nerve symptoms. Discuss these effects with your doctor or health care professional, some ofthem may improve with continued use of this medicine. Male patients may experience a menstrual cycle or spotting during the first 2 months of therapy with this medicine. If this continues, contact your doctor orhealth care professional. This medicine may increase blood sugar. Ask your healthcare provider if changesin diet or medicines are needed if you have diabetes. What side effects may I notice from receiving this medication? Side effects that you should report to your doctor or health care professionalas soon as possible: allergic reactions like skin rash, itching or hives, swelling of the face, lips, or tongue breathing problems chest pain depression or memory disorders pain in your legs or groin pain at site where injected severe headache signs and symptoms of high blood sugar such as being more thirsty or hungry or having to urinate more than normal. You may also feel very tired or have blurry  vision swelling of the feet and legs visual changes vomiting Side effects that usually do not require medical attention (report to yourdoctor or health care professional if they continue or are bothersome): breast swelling or tenderness decrease in sex drive or performance diarrhea hot flashes loss of appetite muscle, joint, or bone pains nausea redness or irritation at site where injected skin problems or acne This list may not describe all possible side effects. Call your doctor for medical advice about side effects. You may report side effects to FDA at1-800-FDA-1088. Where should I keep my medication? Keep out of the reach of children. Store below 25 degrees C (77 degrees F). Do not freeze. Protect from light. Do not use if it is not clear or if there are particles present. Throw away anyunused medicine after the expiration date. NOTE: This sheet is a summary. It may not cover all possible information. If you have questions about this medicine, talk to your doctor, pharmacist, orhealth care provider.  2022 Elsevier/Gold Standard (2018-12-15 10:57:41)

## 2020-07-12 NOTE — Progress Notes (Signed)
Hematology and Oncology Follow Up Visit  Ryan Davis [id number] [birth year] 60 y.o. 07/12/2020   Principle Diagnosis:  Metastatic prostate cancer - androgen sensitive   Past Therapy: 14 fractions of radiation to the lumbar spine Casodex 50 mg by mouth daily - stopped 04/02/2017 due to progression  Zytiga 1000 mg by mouth daily/prednisone 10 mg by mouth daily - started on 04/20/2016 - d/c on 04/02/2017 Xtandi 160 mg po q day - started on 04/06/2017 -- d/c on 06/24/2017 Flutamide 250 mg po q8hr - started 07/08/2017 -- d/c on 04/23/18   Current Therapy:        Lupron 11.5 mg IM every 3 month - next dose 09/2020  Zometa 4 mg IV q 3 months - next dose 09/2020 Apalutamide 240 mg po q day -- started on 04/28/2018   Interim History:  Ryan Davis is here today for follow-up and treatment.  Unfortunately, the PSA has been going up slowly.  I am somewhat surprised by this.  He is always done quite nicely.  When we last saw him, his PSA was up to 7.1.  It has been quite a while since he has been on antiandrogen therapy.  He is always done quite well with this.  We have I think a good option for him would be to get the radionucleotide scan that looks for PSMA uptake.  I think this would be really helpful.If his tumor is active for if this tumor is active for PSMA then I would see about trying the new radionucleotide therapy- Pluvicto.  Is not complaining of any problems with hot flashes or sweats.  He has had a good appetite.  He is looking forward to a nice Father's Day weekend.  He recently had a birthday.  He enjoyed his birthday.  Currently, his performance status is by ECOG 1.   Medications:  Allergies as of 07/12/2020       Reactions   Contrast Media [iodinated Diagnostic Agents] Nausea And Vomiting   Patient vomits immediately after IV contrast is injected just prior to start of scan, was unable to scan patient in correct time frame.  Patient did not warn me of this when dosing the  patient with oral CM and asking the patient history questions until the patient was on the table for the CT scan just prior to injection.  Ct Tech:  Karl Bales 07/29/17    Other Nausea And Vomiting   Patient vomits immediately after IV contrast is injected just prior to start of scan, was unable to scan patient in correct time frame.  Patient did not warn me of this when dosing the patient with oral CM and asking the patient history questions until the patient was on the table for the CT scan just prior to injection.  Ct Tech:  Karl Bales 07/29/17         Medication List        Accurate as of July 12, 2020 12:42 PM. If you have any questions, ask your nurse or doctor.          acetaminophen 500 MG tablet Commonly known as: TYLENOL Take by mouth.   aspirin 81 MG chewable tablet Chew 1 tablet by mouth daily.   atorvastatin 10 MG tablet Commonly known as: LIPITOR TAKE 1 TABLET(10 MG) BY MOUTH DAILY   CALCIUM-VITAMIN D3 PO Take 1 tablet by mouth daily.   diltiazem 240 MG 24 hr capsule Commonly known as: CARDIZEM CD TAKE 1 CAPSULE BY MOUTH EVERY DAY  dronabinol 5 MG capsule Commonly known as: MARINOL TAKE 1 CAPSULE(5 MG) BY MOUTH TWICE DAILY BEFORE A MEAL   Erleada 60 MG tablet Generic drug: apalutamide TAKE 4 TABLETS (240 MG TOTAL) BY MOUTH DAILY. MAY BE TAKEN WITH OR WITHOUT FOOD. SWALLOW TABLETS WHOLE.   fentaNYL 100 MCG/HR Commonly known as: DURAGESIC Place 2 patches onto the skin every 3 (three) days.   gabapentin 800 MG tablet Commonly known as: NEURONTIN Take 800 mg by mouth 3 (three) times daily.   megestrol 625 MG/5ML suspension Commonly known as: MEGACE ES Take 5 mLs (625 mg total) by mouth 2 (two) times daily.   metoprolol tartrate 25 MG tablet Commonly known as: LOPRESSOR TAKE 1/2 TABLET(12.5 MG) BY MOUTH TWICE DAILY   Narcan 4 MG/0.1ML Liqd nasal spray kit Generic drug: naloxone NAR REP ALN   ondansetron 4 MG disintegrating tablet Commonly  known as: Zofran ODT Take 1 tablet (4 mg total) by mouth every 8 (eight) hours as needed for nausea or vomiting.   ondansetron 4 MG tablet Commonly known as: ZOFRAN TAKE 1 TABLET (4 MG TOTAL) BY MOUTH EVERY 6 (SIX) HOURS AS NEEDED FOR NAUSEA OR VOMITING.   oxyCODONE-acetaminophen 10-325 MG tablet Commonly known as: Percocet Take 1 tablet by mouth every 4 (four) hours as needed for pain.   tamsulosin 0.4 MG Caps capsule Commonly known as: FLOMAX TAKE ONE CAPSULE BY MOUTH EVERY DAY        Allergies:  Allergies  Allergen Reactions   Contrast Media [Iodinated Diagnostic Agents] Nausea And Vomiting    Patient vomits immediately after IV contrast is injected just prior to start of scan, was unable to scan patient in correct time frame.  Patient did not warn me of this when dosing the patient with oral CM and asking the patient history questions until the patient was on the table for the CT scan just prior to injection.  Ct Tech:  Karl Bales 07/29/17    Other Nausea And Vomiting    Patient vomits immediately after IV contrast is injected just prior to start of scan, was unable to scan patient in correct time frame.  Patient did not warn me of this when dosing the patient with oral CM and asking the patient history questions until the patient was on the table for the CT scan just prior to injection.  Ct Tech:  Karl Bales 07/29/17     Past Medical History, Surgical history, Social history, and Family History were reviewed and updated.  Review of Systems: All other 10 point review of systems is negative.   Physical Exam:  weight is 162 lb (73.5 kg). His oral temperature is 98.6 F (37 C). His blood pressure is 136/79 and his pulse is 89. His respiration is 16 and oxygen saturation is 100%.   Wt Readings from Last 3 Encounters:  07/12/20 162 lb (73.5 kg)  06/30/20 164 lb (74.4 kg)  04/11/20 173 lb 8 oz (78.7 kg)    Ocular: Sclerae unicteric, pupils equal, round and reactive to  light Ear-nose-throat: Oropharynx clear, dentition fair Lymphatic: No cervical or supraclavicular adenopathy Lungs no rales or rhonchi, good excursion bilaterally Heart regular rate and rhythm, no murmur appreciated Abd soft, nontender, positive bowel sounds MSK no focal spinal tenderness, no joint edema Neuro: non-focal, well-oriented, appropriate affect Breasts: Deferred   Lab Results  Component Value Date   WBC 9.0 07/12/2020   HGB 14.7 07/12/2020   HCT 44.1 07/12/2020   MCV 91.5 07/12/2020   PLT  146 (L) 07/12/2020   Lab Results  Component Value Date   FERRITIN 216 11/04/2017   IRON 77 11/04/2017   TIBC 261 11/04/2017   UIBC 184 11/04/2017   IRONPCTSAT 30 (L) 11/04/2017   Lab Results  Component Value Date   RBC 4.82 07/12/2020   Lab Results  Component Value Date   KAPLAMBRATIO 0.89 03/28/2016   Lab Results  Component Value Date   IGGSERUM 1,066 03/28/2016   IGMSERUM 106 03/28/2016   No results found for: Odetta Pink, SPEI   Chemistry      Component Value Date/Time   NA 139 07/12/2020 1158   NA 143 12/16/2016 1022   K 4.1 07/12/2020 1158   K 3.5 12/16/2016 1022   CL 102 07/12/2020 1158   CL 101 12/16/2016 1022   CO2 30 07/12/2020 1158   CO2 33 12/16/2016 1022   BUN 6 07/12/2020 1158   BUN 4 (L) 12/16/2016 1022   CREATININE 0.91 07/12/2020 1158   CREATININE 0.8 12/16/2016 1022      Component Value Date/Time   CALCIUM 9.7 07/12/2020 1158   CALCIUM 9.4 12/16/2016 1022   ALKPHOS 106 07/12/2020 1158   ALKPHOS 77 12/16/2016 1022   AST 15 07/12/2020 1158   ALT 12 07/12/2020 1158   ALT 16 12/16/2016 1022   BILITOT 0.4 07/12/2020 1158       Impression and Plan: Ryan Davis is a very pleasant 61 yo African American gentleman with metastatic prostate cancer.   He is castrate level for testosterone.  This summer is not a problem.  I just had to believe that he is becoming more resilient to  antiandrogen therapy.  As such, this is why we want to check him out for the PSMA uptake.  I think he would be a good candidate for Pluvicto.  My concern is that he has not had actual chemotherapy yet.  This might be a requirement in order for [place] to use  Pluvicto.  He will get his Lupron and Zometa today.  I will have to have him come back probably in 6 weeks now.  We will going to have to keep a close eye on him given the PSA changes.    [name], MD 6/16/202212:42 PM

## 2020-07-12 NOTE — Telephone Encounter (Signed)
Appts made and printed for pt per 07/12/20 los, pt aware that he will get a call from scheduling at [facility] for the PET   Slyvia Lartigue

## 2020-07-13 LAB — PSA, TOTAL AND FREE
PSA, Free Pct: 23.7 %
PSA, Free: 6.9 ng/mL
Prostate Specific Ag, Serum: 29.1 ng/mL — ABNORMAL HIGH (ref 0.0–4.0)

## 2020-07-13 LAB — TESTOSTERONE: Testosterone: 3 ng/dL — ABNORMAL LOW (ref 264–916)

## 2020-07-16 ENCOUNTER — Other Ambulatory Visit (HOSPITAL_COMMUNITY): Payer: Self-pay

## 2020-07-17 ENCOUNTER — Telehealth: Payer: Self-pay | Admitting: Hematology & Oncology

## 2020-07-17 NOTE — Telephone Encounter (Signed)
Medical Records faxed to:   DDS CASE: [id number] [facility] [birth year]  F: [phone]     COPY SCANNED

## 2020-07-19 ENCOUNTER — Other Ambulatory Visit (HOSPITAL_COMMUNITY): Payer: Self-pay

## 2020-07-19 MED FILL — Apalutamide Tab 60 MG: ORAL | 30 days supply | Qty: 120 | Fill #2 | Status: AC

## 2020-07-24 ENCOUNTER — Other Ambulatory Visit (HOSPITAL_COMMUNITY): Payer: Self-pay

## 2020-07-31 ENCOUNTER — Other Ambulatory Visit (HOSPITAL_COMMUNITY): Payer: Self-pay

## 2020-07-31 ENCOUNTER — Other Ambulatory Visit: Payer: Self-pay

## 2020-07-31 DIAGNOSIS — C61 Malignant neoplasm of prostate: Secondary | ICD-10-CM

## 2020-07-31 DIAGNOSIS — C7951 Secondary malignant neoplasm of bone: Secondary | ICD-10-CM

## 2020-07-31 MED ORDER — APALUTAMIDE 60 MG PO TABS: ORAL_TABLET | ORAL | 6 refills | Status: DC

## 2020-07-31 MED ORDER — APALUTAMIDE 60 MG PO TABS
ORAL_TABLET | ORAL | 6 refills | Status: DC
  Filled 2020-07-31: qty 120, fill #0
  Filled 2020-08-15: qty 120, 30d supply, fill #0

## 2020-08-01 ENCOUNTER — Other Ambulatory Visit (HOSPITAL_COMMUNITY): Payer: Self-pay

## 2020-08-03 ENCOUNTER — Other Ambulatory Visit: Payer: Self-pay | Admitting: *Deleted

## 2020-08-03 ENCOUNTER — Other Ambulatory Visit (HOSPITAL_COMMUNITY): Payer: Self-pay

## 2020-08-03 DIAGNOSIS — G8929 Other chronic pain: Secondary | ICD-10-CM

## 2020-08-03 DIAGNOSIS — R52 Pain, unspecified: Secondary | ICD-10-CM

## 2020-08-03 DIAGNOSIS — C61 Malignant neoplasm of prostate: Secondary | ICD-10-CM

## 2020-08-03 MED ORDER — FENTANYL 100 MCG/HR TD PT72: 2.0000 | MEDICATED_PATCH | TRANSDERMAL | 0 refills | Status: DC

## 2020-08-03 MED ORDER — OXYCODONE-ACETAMINOPHEN 10-325 MG PO TABS: 1.0000 | ORAL_TABLET | ORAL | 0 refills | Status: DC | PRN

## 2020-08-08 ENCOUNTER — Encounter (HOSPITAL_BASED_OUTPATIENT_CLINIC_OR_DEPARTMENT_OTHER): Payer: Self-pay

## 2020-08-08 ENCOUNTER — Emergency Department (HOSPITAL_BASED_OUTPATIENT_CLINIC_OR_DEPARTMENT_OTHER)
Admission: EM | Admit: 2020-08-08 | Discharge: 2020-08-08 | Disposition: A | Discharge status: Home / Self Care | Payer: Medicare Other | Attending: Emergency Medicine | Admitting: Emergency Medicine

## 2020-08-08 ENCOUNTER — Other Ambulatory Visit: Payer: Self-pay

## 2020-08-08 ENCOUNTER — Encounter: Payer: Self-pay | Admitting: Hematology & Oncology

## 2020-08-08 DIAGNOSIS — R197 Diarrhea, unspecified: Secondary | ICD-10-CM | POA: Diagnosis not present

## 2020-08-08 DIAGNOSIS — Z923 Personal history of irradiation: Secondary | ICD-10-CM | POA: Diagnosis not present

## 2020-08-08 DIAGNOSIS — Z79899 Other long term (current) drug therapy: Secondary | ICD-10-CM | POA: Diagnosis not present

## 2020-08-08 DIAGNOSIS — Z7982 Long term (current) use of aspirin: Secondary | ICD-10-CM | POA: Diagnosis not present

## 2020-08-08 DIAGNOSIS — Z85830 Personal history of malignant neoplasm of bone: Secondary | ICD-10-CM | POA: Insufficient documentation

## 2020-08-08 DIAGNOSIS — Z8546 Personal history of malignant neoplasm of prostate: Secondary | ICD-10-CM | POA: Diagnosis not present

## 2020-08-08 DIAGNOSIS — R112 Nausea with vomiting, unspecified: Secondary | ICD-10-CM | POA: Insufficient documentation

## 2020-08-08 LAB — COMPREHENSIVE METABOLIC PANEL
ALT: 12 U/L (ref 0–44)
AST: 14 U/L — ABNORMAL LOW (ref 15–41)
Albumin: 3.8 g/dL (ref 3.5–5.0)
Alkaline Phosphatase: 119 U/L (ref 38–126)
Anion gap: 6 (ref 5–15)
BUN: 6 mg/dL (ref 6–20)
CO2: 26 mmol/L (ref 22–32)
Calcium: 8.3 mg/dL — ABNORMAL LOW (ref 8.9–10.3)
Chloride: 103 mmol/L (ref 98–111)
Creatinine, Ser: 0.85 mg/dL (ref 0.61–1.24)
GFR, Estimated: >60 mL/min (ref >60)
Glucose, Bld: 100 mg/dL — ABNORMAL HIGH (ref 70–99)
Potassium: 3.9 mmol/L (ref 3.5–5.1)
Sodium: 135 mmol/L (ref 135–145)
Total Bilirubin: 0.3 mg/dL (ref 0.3–1.2)
Total Protein: 6.6 g/dL (ref 6.5–8.1)

## 2020-08-08 LAB — CBC WITH DIFFERENTIAL/PLATELET
Abs Immature Granulocytes: 0.04 10*3/uL (ref 0.00–0.07)
Basophils Absolute: 0 10*3/uL (ref 0.0–0.1)
Basophils Relative: 0 %
Eosinophils Absolute: 0.1 10*3/uL (ref 0.0–0.5)
Eosinophils Relative: 1 %
HCT: 45.4 % (ref 39.0–52.0)
Hemoglobin: 15.4 g/dL (ref 13.0–17.0)
Immature Granulocytes: 0 %
Lymphocytes Relative: 18 %
Lymphs Abs: 1.7 10*3/uL (ref 0.7–4.0)
MCH: 30.7 pg (ref 26.0–34.0)
MCHC: 33.9 g/dL (ref 30.0–36.0)
MCV: 90.6 fL (ref 80.0–100.0)
Monocytes Absolute: 0.7 10*3/uL (ref 0.1–1.0)
Monocytes Relative: 7 %
Neutro Abs: 6.8 10*3/uL (ref 1.7–7.7)
Neutrophils Relative %: 74 %
Platelets: 137 10*3/uL — ABNORMAL LOW (ref 150–400)
RBC: 5.01 MIL/uL (ref 4.22–5.81)
RDW: 14.4 % (ref 11.5–15.5)
WBC: 9.3 10*3/uL (ref 4.0–10.5)
nRBC: 0 % (ref 0.0–0.2)

## 2020-08-08 LAB — LIPASE, BLOOD: Lipase: 20 U/L (ref 11–51)

## 2020-08-08 MED ORDER — ONDANSETRON 4 MG PO TBDP: 4.0000 mg | ORAL_TABLET | Freq: Three times a day (TID) | ORAL | 0 refills | Status: DC | PRN

## 2020-08-08 MED ORDER — DICYCLOMINE HCL 20 MG PO TABS: 20.0000 mg | ORAL_TABLET | Freq: Two times a day (BID) | ORAL | 0 refills | Status: DC

## 2020-08-08 MED ORDER — SODIUM CHLORIDE 0.9 % IV BOLUS
1000.0000 mL | Freq: Once | INTRAVENOUS | Status: AC
  Administered 2020-08-08: 1000 mL via INTRAVENOUS

## 2020-08-08 MED ORDER — FAMOTIDINE IN NACL 20-0.9 MG/50ML-% IV SOLN
20.0000 mg | Freq: Once | INTRAVENOUS | Status: AC
  Administered 2020-08-08: 20 mg via INTRAVENOUS
  Filled 2020-08-08: qty 50

## 2020-08-08 MED ORDER — MORPHINE SULFATE (PF) 4 MG/ML IV SOLN
4.0000 mg | Freq: Once | INTRAVENOUS | Status: AC
  Administered 2020-08-08: 4 mg via INTRAVENOUS
  Filled 2020-08-08: qty 1

## 2020-08-08 MED ORDER — MORPHINE SULFATE (PF) 2 MG/ML IV SOLN: 2.0000 mg | Freq: Once | INTRAVENOUS | Status: DC

## 2020-08-08 MED ORDER — FAMOTIDINE 20 MG PO TABS: 20.0000 mg | ORAL_TABLET | Freq: Two times a day (BID) | ORAL | 0 refills | Status: DC

## 2020-08-08 MED ORDER — ONDANSETRON HCL 4 MG/2ML IJ SOLN
4.0000 mg | Freq: Once | INTRAMUSCULAR | Status: AC
  Administered 2020-08-08: 4 mg via INTRAVENOUS
  Filled 2020-08-08: qty 2

## 2020-08-08 NOTE — ED Provider Notes (Signed)
[facility] Provider Note   CSN: [id number] Arrival date & time: 08/08/20  1331     History Chief Complaint  Patient presents with   Abdominal Pain    Ryan Davis is a 60 y.o. male.  HPI Patient is a 60 year old male with past medical history significant for A. fib, prostate cancer diastasis to the/spine, currently taking dronabinol, fentanyl patches and oxycodone.  He also takes tamsulosin.  He states he has not had any surgery.  Patient states that he has had abdominal pain nausea vomiting and diarrhea since yesterday.  He states he is only had 1 episode of vomiting yesterday.  He states it was nonbloody nonbilious.  He states that his diarrhea has been more loose stool and states that he has some associated abdominal pain that is crampy and achy seems affect his entire abdomen.  Denies any chest pain or shortness of breath.  States that he feels that his symptoms came on yesterday after he ate [employer] burger and an apple pie.  He denies any fevers no BRBPR, no melena hematochezia, no cough or fever.     Past Medical History:  Diagnosis Date   A-fib [facility])    see 03-28-16 admission   Back pain without radiculopathy 03/28/2016   Dyspnea    endorses since treatment radiation , still gets SOB occasionally    Dysrhythmia    afib    Goals of care, counseling/discussion 06/24/2017   History of radiation therapy 09/16/16-10/06/16   35 Gy in 14 fractions to the lumbar spine   Nocturia    Prostate cancer metastatic to bone ([place]) 03/31/2016   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[place]) 03/31/2016    Patient Active Problem List   Diagnosis Date Noted   Goals of care, counseling/discussion 06/24/2017   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[place]) 03/31/2016   Prostate cancer metastatic to bone ([place]) 03/31/2016   Atrial fibrillation ([place]) 03/30/2016   Back pain without radiculopathy 03/28/2016   Atrial fibrillation with RVR ([place])  03/28/2016    Past Surgical History:  Procedure Laterality Date   MULTIPLE EXTRACTIONS WITH ALVEOLOPLASTY N/A 11/19/2016   Procedure: EXTRACTION OF TOOTH #'S 1,3,5-7,9-17, AND 20- 29 WITH ALVEOLOPLASTY, BILATERAL MANDIBULAR TORI REDUCTIONS AND BILATERAL MAXILLARY BUCCAL EXOSTOSES REDUCTIONS;  Surgeon: Lenn Cal, DDS;  Location: [facility] ORS;  Service: Oral Surgery;  Laterality: N/A;   NO PAST SURGERIES         Family History  Problem Relation Age of Onset   Stroke Sister    Lupus Mother    Cancer Neg Hx    Heart disease Neg Hx    Diabetes Neg Hx     Social History   Tobacco Use   Smoking status: Former    Packs/day: 0.50    Years: 39.00    Pack years: 19.50    Types: Cigarettes   Smokeless tobacco: Never  Vaping Use   Vaping Use: Never used  Substance Use Topics   Alcohol use: Not Currently   Drug use: No    Home Medications Prior to Admission medications   Medication Sig Start Date End Date Taking? Authorizing Provider  dicyclomine (BENTYL) 20 MG tablet Take 1 tablet (20 mg total) by mouth 2 (two) times daily. 08/08/20  Yes Saulo Anthis S, PA  famotidine (PEPCID) 20 MG tablet Take 1 tablet (20 mg total) by mouth 2 (two) times daily. 08/08/20  Yes Jailey Booton S, PA  ondansetron (ZOFRAN ODT) 4 MG disintegrating  tablet Take 1 tablet (4 mg total) by mouth every 8 (eight) hours as needed for nausea or vomiting. 08/08/20  Yes Pati Gallo S, PA  acetaminophen (TYLENOL) 500 MG tablet Take by mouth. 08/25/18   [provider]  apalutamide (ERLEADA) 60 MG tablet TAKE 4 TABLETS (240 MG TOTAL) BY MOUTH DAILY. MAY BE TAKEN WITH OR WITHOUT FOOD. SWALLOW TABLETS WHOLE. 07/31/20 07/31/21  [name], MD  aspirin 81 MG chewable tablet Chew 1 tablet by mouth daily. 04/09/17   [provider]  atorvastatin (LIPITOR) 10 MG tablet TAKE 1 TABLET(10 MG) BY MOUTH DAILY 03/27/20   [provider]  Calcium Carbonate-Vitamin D (CALCIUM-VITAMIN D3 PO) Take 1  tablet by mouth daily.    [provider]  diltiazem (CARDIZEM CD) 240 MG 24 hr capsule TAKE 1 CAPSULE BY MOUTH EVERY DAY 05/14/20   [name], MD  dronabinol (MARINOL) 5 MG capsule TAKE 1 CAPSULE(5 MG) BY MOUTH TWICE DAILY BEFORE A MEAL 03/18/19   [place], Holli Humbles, NP  fentaNYL (DURAGESIC) 100 MCG/HR Place 2 patches onto the skin every 3 (three) days. 08/03/20   [name], MD  gabapentin (NEURONTIN) 800 MG tablet Take 800 mg by mouth 3 (three) times daily. 06/30/19   [provider]  megestrol (MEGACE ES) 625 MG/5ML suspension Take 5 mLs (625 mg total) by mouth 2 (two) times daily. 03/24/18   [name], MD  metoprolol tartrate (LOPRESSOR) 25 MG tablet TAKE 1/2 TABLET(12.5 MG) BY MOUTH TWICE DAILY 03/27/20   [name], MD  NARCAN 4 MG/0.1ML LIQD nasal spray kit NAR REP ALN Patient not taking: No sig reported 02/19/18   [provider]  ondansetron (ZOFRAN) 4 MG tablet TAKE 1 TABLET (4 MG TOTAL) BY MOUTH EVERY 6 (SIX) HOURS AS NEEDED FOR NAUSEA OR VOMITING. 02/19/18   [place], Holli Humbles, NP  oxyCODONE-acetaminophen (PERCOCET) 10-325 MG tablet Take 1 tablet by mouth every 4 (four) hours as needed for pain. 08/03/20   [name], MD  tamsulosin (FLOMAX) 0.4 MG CAPS capsule TAKE ONE CAPSULE BY MOUTH EVERY DAY 06/25/20   [name], MD    Allergies    Contrast media [iodinated diagnostic agents] and Other  Review of Systems   Review of Systems  Constitutional:  Positive for fatigue. Negative for chills and fever.  HENT:  Negative for congestion.   Eyes:  Negative for pain.  Respiratory:  Negative for cough and shortness of breath.   Cardiovascular:  Negative for chest pain and leg swelling.  Gastrointestinal:  Positive for abdominal pain, diarrhea, nausea and vomiting.  Genitourinary:  Negative for dysuria.  Musculoskeletal:  Negative for myalgias.  Skin:  Negative for rash.  Neurological:  Negative for dizziness and headaches.    Physical Exam Updated Vital Signs BP 139/84 (BP Location: Right Arm)   Pulse 68   Temp 98.2 F (36.8 C) (Oral)   Resp 19   Ht 6' 2"  (1.88 m)   Wt 72.1 kg   SpO2 100%   BMI 20.41 kg/m   Physical Exam Vitals and nursing note reviewed.  Constitutional:      General: He is not in acute distress.    Comments: Somewhat dry oral mucosa. Pleasant, able answer questions appropriately follow commands.  HENT:     Head: Normocephalic and atraumatic.     Nose: Nose normal.  Eyes:     General: No scleral icterus. Cardiovascular:     Rate and Rhythm: Normal rate  and regular rhythm.     Pulses: Normal pulses.     Heart sounds: Normal heart sounds.  Pulmonary:     Effort: Pulmonary effort is normal. No respiratory distress.     Breath sounds: No wheezing.  Abdominal:     Palpations: Abdomen is soft.     Tenderness: There is no abdominal tenderness.     Comments: Abdomen soft nontender.  No guarding or rebound.  Musculoskeletal:     Cervical back: Normal range of motion.     Right lower leg: No edema.     Left lower leg: No edema.  Skin:    General: Skin is warm and dry.     Capillary Refill: Capillary refill takes less than 2 seconds.  Neurological:     Mental Status: He is alert. Mental status is at baseline.  Psychiatric:        Mood and Affect: Mood normal.        Behavior: Behavior normal.    ED Results / Procedures / Treatments   Labs (all labs ordered are listed, but only abnormal results are displayed) Labs Reviewed  CBC WITH DIFFERENTIAL/PLATELET - Abnormal; Notable for the following components:      Result Value   Platelets 137 (*)    All other components within normal limits  COMPREHENSIVE METABOLIC PANEL - Abnormal; Notable for the following components:   Glucose, Bld 100 (*)    Calcium 8.3 (*)    AST 14 (*)    All other components within normal limits  LIPASE, BLOOD    EKG None  Radiology No results found.  Procedures Procedures   Medications  Ordered in ED Medications  sodium chloride 0.9 % bolus 1,000 mL (0 mLs Intravenous Stopped 08/08/20 1537)  ondansetron (ZOFRAN) injection 4 mg (4 mg Intravenous Given 08/08/20 1438)  famotidine (PEPCID) IVPB 20 mg premix (0 mg Intravenous Stopped 08/08/20 1512)  morphine 4 MG/ML injection 4 mg (4 mg Intravenous Given 08/08/20 1438)    ED Course  I have reviewed the triage vital signs and the nursing notes.  Pertinent labs & imaging results that were available during my care of the patient were reviewed by me and considered in my medical decision making (see chart for details).    MDM Rules/Calculators/A&P                          Patient is well-appearing 60 year old gentleman with a history of metastatic prostate cancer who presented today with nausea vomiting diarrhea since yesterday this occurred after he ate a [employer] burger and apple pie.  On physical exam patient has no abdominal tenderness.  Somewhat dry oral mucosa but not significantly so.  Will provide patient with 1 dose of Zofran, small dose of morphine, Pepcid and 1 L of normal saline.  CBC without leukocytosis or anemia.  Mild thrombocytopenia at 137 he will follow-up with PCP and oncologist about this.  CMP unremarkable.  Lipase within normal limits pancreatitis.  Reassessed patient his abdomen remains soft and nontender.  He states he feels much improved.  Will discharge home with conservative therapy and close follow-up with PCP return precautions were given.  Final Clinical Impression(s) / ED Diagnoses Final diagnoses:  Nausea vomiting and diarrhea    Rx / DC Orders ED Discharge Orders          Ordered    ondansetron (ZOFRAN ODT) 4 MG disintegrating tablet  Every 8 hours PRN  08/08/20 1611    famotidine (PEPCID) 20 MG tablet  2 times daily        08/08/20 1611    dicyclomine (BENTYL) 20 MG tablet  2 times daily        08/08/20 1611             Pati Gallo [place], [place] 08/08/20 1613    Quintella Reichert, MD 08/08/20 [phone]

## 2020-08-08 NOTE — ED Triage Notes (Signed)
Pt c/o abd pain, diarrhea started yesterday-NAD-steady gait with own cane

## 2020-08-08 NOTE — Discharge Instructions (Addendum)
Please use Zofran as needed for nausea at home.  Drink plenty of water.  Also recommend follow-up closely with your primary care provider.  Prescribed a medication called Bentyl that you should take as prescribed.  Please follow-up with your primary care provider.  You may always return to the ER for any new or concerning symptoms.  Specifically if you develop a fever or abdominal pain or if your abdominal pain worsens or seems to localize more to your right lower abdomen.

## 2020-08-13 ENCOUNTER — Other Ambulatory Visit (HOSPITAL_COMMUNITY): Payer: Self-pay

## 2020-08-14 ENCOUNTER — Other Ambulatory Visit: Payer: Self-pay

## 2020-08-14 ENCOUNTER — Ambulatory Visit (HOSPITAL_COMMUNITY)
Admission: RE | Admit: 2020-08-14 | Discharge: 2020-08-14 | Disposition: A | Discharge status: Home / Self Care | Payer: Medicare Other | Source: Ambulatory Visit | Attending: Hematology & Oncology | Admitting: Hematology & Oncology

## 2020-08-14 ENCOUNTER — Ambulatory Visit (HOSPITAL_COMMUNITY): Admission: RE | Admit: 2020-08-14 | Payer: Medicare Other | Source: Ambulatory Visit

## 2020-08-14 DIAGNOSIS — C7951 Secondary malignant neoplasm of bone: Secondary | ICD-10-CM | POA: Insufficient documentation

## 2020-08-14 DIAGNOSIS — C61 Malignant neoplasm of prostate: Secondary | ICD-10-CM | POA: Insufficient documentation

## 2020-08-14 MED ORDER — PIFLIFOLASTAT F 18 (PYLARIFY) INJECTION
9.0000 | Freq: Once | INTRAVENOUS | Status: AC
  Administered 2020-08-14: 9.12 via INTRAVENOUS

## 2020-08-15 ENCOUNTER — Other Ambulatory Visit (HOSPITAL_COMMUNITY): Payer: Self-pay

## 2020-08-16 ENCOUNTER — Other Ambulatory Visit (HOSPITAL_COMMUNITY): Payer: Self-pay

## 2020-08-23 ENCOUNTER — Other Ambulatory Visit (HOSPITAL_COMMUNITY): Payer: Self-pay

## 2020-08-26 ENCOUNTER — Emergency Department (HOSPITAL_BASED_OUTPATIENT_CLINIC_OR_DEPARTMENT_OTHER): Payer: Medicare Other

## 2020-08-26 ENCOUNTER — Other Ambulatory Visit: Payer: Self-pay

## 2020-08-26 ENCOUNTER — Encounter (HOSPITAL_BASED_OUTPATIENT_CLINIC_OR_DEPARTMENT_OTHER): Payer: Self-pay | Admitting: Emergency Medicine

## 2020-08-26 ENCOUNTER — Emergency Department (HOSPITAL_BASED_OUTPATIENT_CLINIC_OR_DEPARTMENT_OTHER)
Admission: EM | Admit: 2020-08-26 | Discharge: 2020-08-26 | Disposition: A | Discharge status: Home / Self Care | Payer: Medicare Other | Attending: Emergency Medicine | Admitting: Emergency Medicine

## 2020-08-26 DIAGNOSIS — R1030 Lower abdominal pain, unspecified: Secondary | ICD-10-CM

## 2020-08-26 DIAGNOSIS — Z87891 Personal history of nicotine dependence: Secondary | ICD-10-CM | POA: Insufficient documentation

## 2020-08-26 DIAGNOSIS — Z7982 Long term (current) use of aspirin: Secondary | ICD-10-CM | POA: Insufficient documentation

## 2020-08-26 DIAGNOSIS — R1032 Left lower quadrant pain: Secondary | ICD-10-CM | POA: Diagnosis present

## 2020-08-26 DIAGNOSIS — C7951 Secondary malignant neoplasm of bone: Secondary | ICD-10-CM | POA: Diagnosis not present

## 2020-08-26 DIAGNOSIS — C61 Malignant neoplasm of prostate: Secondary | ICD-10-CM | POA: Diagnosis not present

## 2020-08-26 DIAGNOSIS — N390 Urinary tract infection, site not specified: Secondary | ICD-10-CM | POA: Diagnosis not present

## 2020-08-26 DIAGNOSIS — K5900 Constipation, unspecified: Secondary | ICD-10-CM | POA: Diagnosis not present

## 2020-08-26 LAB — CBC WITH DIFFERENTIAL/PLATELET
Abs Immature Granulocytes: 0.03 K/uL (ref 0.00–0.07)
Basophils Absolute: 0 K/uL (ref 0.0–0.1)
Basophils Relative: 1 %
Eosinophils Absolute: 0.3 K/uL (ref 0.0–0.5)
Eosinophils Relative: 4 %
HCT: 41.1 % (ref 39.0–52.0)
Hemoglobin: 14.2 g/dL (ref 13.0–17.0)
Immature Granulocytes: 0 %
Lymphocytes Relative: 20 %
Lymphs Abs: 1.7 K/uL (ref 0.7–4.0)
MCH: 31.3 pg (ref 26.0–34.0)
MCHC: 34.5 g/dL (ref 30.0–36.0)
MCV: 90.5 fL (ref 80.0–100.0)
Monocytes Absolute: 0.5 K/uL (ref 0.1–1.0)
Monocytes Relative: 6 %
Neutro Abs: 6 K/uL (ref 1.7–7.7)
Neutrophils Relative %: 69 %
Platelets: 134 K/uL — ABNORMAL LOW (ref 150–400)
RBC: 4.54 MIL/uL (ref 4.22–5.81)
RDW: 14.1 % (ref 11.5–15.5)
WBC: 8.6 K/uL (ref 4.0–10.5)
nRBC: 0 % (ref 0.0–0.2)

## 2020-08-26 LAB — COMPREHENSIVE METABOLIC PANEL
ALT: 12 U/L (ref 0–44)
AST: 15 U/L (ref 15–41)
Albumin: 4 g/dL (ref 3.5–5.0)
Alkaline Phosphatase: 145 U/L — ABNORMAL HIGH (ref 38–126)
Anion gap: 9 (ref 5–15)
BUN: 5 mg/dL — ABNORMAL LOW (ref 6–20)
CO2: 27 mmol/L (ref 22–32)
Calcium: 8.8 mg/dL — ABNORMAL LOW (ref 8.9–10.3)
Chloride: 102 mmol/L (ref 98–111)
Creatinine, Ser: 0.77 mg/dL (ref 0.61–1.24)
GFR, Estimated: >60 mL/min (ref >60)
Glucose, Bld: 129 mg/dL — ABNORMAL HIGH (ref 70–99)
Potassium: 4 mmol/L (ref 3.5–5.1)
Sodium: 138 mmol/L (ref 135–145)
Total Bilirubin: 0.1 mg/dL — ABNORMAL LOW (ref 0.3–1.2)
Total Protein: 7 g/dL (ref 6.5–8.1)

## 2020-08-26 LAB — URINALYSIS, MICROSCOPIC (REFLEX): RBC / HPF: >50 RBC/hpf (ref 0–5)

## 2020-08-26 LAB — URINALYSIS, ROUTINE W REFLEX MICROSCOPIC
Bilirubin Urine: NEGATIVE
Glucose, UA: NEGATIVE mg/dL
Ketones, ur: NEGATIVE mg/dL
Nitrite: NEGATIVE
Protein, ur: NEGATIVE mg/dL
Specific Gravity, Urine: 1.015 (ref 1.005–1.030)
pH: 6.5 (ref 5.0–8.0)

## 2020-08-26 LAB — LIPASE, BLOOD: Lipase: 20 U/L (ref 11–51)

## 2020-08-26 MED ORDER — ONDANSETRON HCL 4 MG/2ML IJ SOLN
4.0000 mg | Freq: Once | INTRAMUSCULAR | Status: AC
  Administered 2020-08-26: 4 mg via INTRAVENOUS
  Filled 2020-08-26: qty 2

## 2020-08-26 MED ORDER — HYDROMORPHONE HCL 1 MG/ML IJ SOLN
1.0000 mg | Freq: Once | INTRAMUSCULAR | Status: AC
Start: 2020-08-26 — End: 2020-08-26
  Administered 2020-08-26: 1 mg via INTRAVENOUS
  Filled 2020-08-26: qty 1

## 2020-08-26 MED ORDER — NALOXONE HCL 0.4 MG/0.4ML IJ SOAJ: INTRAMUSCULAR | 0 refills | Status: AC

## 2020-08-26 MED ORDER — HYDROMORPHONE HCL 1 MG/ML IJ SOLN
1.0000 mg | Freq: Once | INTRAMUSCULAR | Status: AC
  Administered 2020-08-26: 1 mg via INTRAVENOUS
  Filled 2020-08-26: qty 1

## 2020-08-26 MED ORDER — CEFDINIR 300 MG PO CAPS: 300.0000 mg | ORAL_CAPSULE | Freq: Two times a day (BID) | ORAL | 0 refills | Status: AC

## 2020-08-26 MED ORDER — HYDROMORPHONE HCL 4 MG PO TABS
4.0000 mg | ORAL_TABLET | Freq: Four times a day (QID) | ORAL | 0 refills | Status: DC | PRN
Start: 2020-08-26 — End: 2020-08-31

## 2020-08-26 MED ORDER — SODIUM CHLORIDE 0.9 % IV SOLN
1.0000 g | Freq: Once | INTRAVENOUS | Status: AC
  Administered 2020-08-26: 1 g via INTRAVENOUS
  Filled 2020-08-26: qty 10

## 2020-08-26 MED ORDER — SODIUM CHLORIDE 0.9 % IV BOLUS
500.0000 mL | Freq: Once | INTRAVENOUS | Status: AC
  Administered 2020-08-26: 500 mL via INTRAVENOUS

## 2020-08-26 MED ORDER — FENTANYL CITRATE (PF) 100 MCG/2ML IJ SOLN
50.0000 ug | Freq: Once | INTRAMUSCULAR | Status: AC
Start: 2020-08-26 — End: 2020-08-26
  Administered 2020-08-26: 50 ug via INTRAVENOUS
  Filled 2020-08-26: qty 2

## 2020-08-26 NOTE — ED Triage Notes (Signed)
Pt arrives pov with c/o abdominal pain, and lower back pain. reports constipation x 11 days prior to diarrhea that started last night. Pt reports normal diet. Pt ambulatory with cane. Pt receiving cancer treatment

## 2020-08-26 NOTE — Discharge Instructions (Addendum)
DO NOT take Dilaudid and Percocet together.  Eat a diet rich in fiber and drink plenty of water. Exercise 15-20 minutes per day if possible.  Bowel routine steps:  Step 1: Take 2 Senokot pills each day at bedtime. If no bowel movement after 2 days progress to step 2. Step 2: Take 2 Senokot pills in the morning and 2 in the evening. If no bowel movement in one day progress to step 3. Step 3: Take 3 Senokot pills and 1 capful of MiraLAX in the morning and in the evening. If no bowel movement after one day, start step 4. Step 4: Take 4 Senokot pills and one capful of MiraLAX in the morning, another capful of MiraLAX at lunch, and 4 Senokot pills and a capful of MiraLAX in the evening  *Talk to your doctor if you are still constipated after following these 4 steps. Don't take more than 8 Senokot pills a day. If loose stools develop, go back to the previous step. Do not stop the bowel routine completely.

## 2020-08-26 NOTE — ED Notes (Signed)
Attempted I&O cath x1 without success. Met resistance at the level of the prostate. Pt refusing further attempts as he did not tolerate well.

## 2020-08-26 NOTE — ED Notes (Signed)
Pt teaching provided on medications that may cause drowsiness. Pt instructed not to drive or operate heavy machinery while taking the prescribed medication. PT provided detailed instructions about use of Narcan in case of accidental overdose. Pt verbalized understanding.   Pt provided discharge instructions and prescription information. Pt was given the opportunity to ask questions and questions were answered. Discharge signature not obtained in the setting of the COVID-19 pandemic in order to reduce high touch surfaces.

## 2020-08-26 NOTE — ED Notes (Signed)
Pt cannot urinate at this time, he understands we need a sample.

## 2020-08-26 NOTE — ED Notes (Signed)
Patient transported to CT 

## 2020-08-26 NOTE — ED Provider Notes (Signed)
[facility] Provider Note   CSN: [id number] Arrival date & time: 08/26/20  2633     History Chief Complaint  Patient presents with   Abdominal Pain    [facility] is a 60 y.o. male.  [facility] states that he did not have a bowel movement in 11 days, and when he took something for it yesterday, he was sitting on the toilet.  He was straining, and suddenly, he developed severe lower abdominal pain radiating to his low back.  He had to sleep with his knees on the bed, bent over a pillow.  He is experiencing severe pain and now has started to have some diarrhea.  He also has nausea and vomiting.  He has had some unintentional weight loss during his course of therapy.  He has had several ED visits for similar problems.  The history is provided by the patient.  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 Associated symptoms: constipation, diarrhea and nausea   Associated symptoms: no chest pain, no chills, no cough, no dysuria, no fever, no hematuria, no shortness of breath, no sore throat and no vomiting       Past Medical History:  Diagnosis Date   A-fib [facility])    see 03-28-16 admission   Back pain without radiculopathy 03/28/2016   Dyspnea    endorses since treatment radiation , still gets SOB occasionally    Dysrhythmia    afib    Goals of care, counseling/discussion 06/24/2017   History of radiation therapy 09/16/16-10/06/16   35 Gy in 14 fractions to the lumbar spine   Nocturia    Prostate cancer metastatic to bone ([place]) 03/31/2016   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[place]) 03/31/2016    Patient Active Problem  List   Diagnosis Date Noted   Goals of care, counseling/discussion 06/24/2017   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[place]) 03/31/2016   Prostate cancer metastatic to bone ([place]) 03/31/2016   Atrial fibrillation ([place]) 03/30/2016   Back pain without radiculopathy 03/28/2016   Atrial fibrillation with RVR ([place]) 03/28/2016    Past Surgical History:  Procedure Laterality Date   MULTIPLE EXTRACTIONS WITH ALVEOLOPLASTY N/A 11/19/2016   Procedure: EXTRACTION OF TOOTH #'S 1,3,5-7,9-17, AND 20- 29 WITH ALVEOLOPLASTY, BILATERAL MANDIBULAR TORI REDUCTIONS AND BILATERAL MAXILLARY BUCCAL EXOSTOSES REDUCTIONS;  Surgeon: Lenn Cal, DDS;  Location: [facility];  Service: Oral Surgery;  Laterality: N/A;   NO PAST SURGERIES         Family History  Problem Relation Age of Onset   Stroke Sister    Lupus Mother    Cancer Neg Hx    Heart disease Neg Hx    Diabetes Neg Hx     Social History   Tobacco Use   Smoking status: Former    Packs/day: 0.50    Years: 39.00    Pack years: 19.50    Types: Cigarettes   Smokeless tobacco: Never  Vaping Use   Vaping Use: Never used  Substance Use Topics   Alcohol use: Not Currently   Drug use: No    Home Medications Prior to Admission medications   Medication Sig Start Date  End Date Taking? Authorizing Provider  acetaminophen (TYLENOL) 500 MG tablet Take by mouth. 08/25/18   [provider]  apalutamide (ERLEADA) 60 MG tablet TAKE 4 TABLETS (240 MG TOTAL) BY MOUTH DAILY. MAY BE TAKEN WITH OR WITHOUT FOOD. SWALLOW TABLETS WHOLE. 07/31/20 07/31/21  [name], MD  aspirin 81 MG chewable tablet Chew 1 tablet by mouth daily. 04/09/17   [provider]  atorvastatin (LIPITOR) 10 MG tablet TAKE 1 TABLET(10 MG) BY MOUTH DAILY 03/27/20   [provider]  Calcium Carbonate-Vitamin D (CALCIUM-VITAMIN D3 PO) Take 1 tablet by mouth daily.    [provider]  dicyclomine (BENTYL) 20 MG tablet Take 1 tablet (20 mg total)  by mouth 2 (two) times daily. 08/08/20   Tedd Sias, PA  diltiazem (CARDIZEM CD) 240 MG 24 hr capsule TAKE 1 CAPSULE BY MOUTH EVERY DAY 05/14/20   [name], MD  dronabinol (MARINOL) 5 MG capsule TAKE 1 CAPSULE(5 MG) BY MOUTH TWICE DAILY BEFORE A MEAL 03/18/19   [place], Holli Humbles, NP  famotidine (PEPCID) 20 MG tablet Take 1 tablet (20 mg total) by mouth 2 (two) times daily. 08/08/20   Tedd Sias, PA  fentaNYL (DURAGESIC) 100 MCG/HR Place 2 patches onto the skin every 3 (three) days. 08/03/20   [name], MD  gabapentin (NEURONTIN) 800 MG tablet Take 800 mg by mouth 3 (three) times daily. 06/30/19   [provider]  megestrol (MEGACE ES) 625 MG/5ML suspension Take 5 mLs (625 mg total) by mouth 2 (two) times daily. 03/24/18   [name], MD  metoprolol tartrate (LOPRESSOR) 25 MG tablet TAKE 1/2 TABLET(12.5 MG) BY MOUTH TWICE DAILY 03/27/20   [name], MD  NARCAN 4 MG/0.1ML LIQD nasal spray kit NAR REP ALN Patient not taking: No sig reported 02/19/18   [provider]  ondansetron (ZOFRAN ODT) 4 MG disintegrating tablet Take 1 tablet (4 mg total) by mouth every 8 (eight) hours as needed for nausea or vomiting. 08/08/20   [place], Wylder S, PA  ondansetron (ZOFRAN) 4 MG tablet TAKE 1 TABLET (4 MG TOTAL) BY MOUTH EVERY 6 (SIX) HOURS AS NEEDED FOR NAUSEA OR VOMITING. 02/19/18   [place], Holli Humbles, NP  oxyCODONE-acetaminophen (PERCOCET) 10-325 MG tablet Take 1 tablet by mouth every 4 (four) hours as needed for pain. 08/03/20   [name], MD  tamsulosin (FLOMAX) 0.4 MG CAPS capsule TAKE ONE CAPSULE BY MOUTH EVERY DAY 06/25/20   [name], MD    Allergies    Contrast media [iodinated diagnostic agents] and Other  Review of Systems   Review of Systems  Constitutional:  Positive for unexpected weight change. Negative for chills and fever.  HENT:  Negative for ear pain and sore throat.   Eyes:  Negative for pain and visual disturbance.   Respiratory:  Negative for cough and shortness of breath.   Cardiovascular:  Negative for chest pain and palpitations.  Gastrointestinal:  Positive for abdominal pain, constipation, diarrhea and nausea. Negative for vomiting.  Genitourinary:  Negative for dysuria and hematuria.  Musculoskeletal:  Negative for arthralgias and back pain.  Skin:  Negative for color change and rash.  Neurological:  Negative for seizures and syncope.  All other systems reviewed and are negative.  Physical Exam Updated Vital Signs BP (!) 136/96 (BP Location: Right Arm)   Pulse 68   Temp 98.6 F (37 C) (Oral)   Resp 18   Ht 6' 2"  (1.88 m)  Wt 72.6 kg   SpO2 100%   BMI 20.54 kg/m   Physical Exam Vitals and nursing note reviewed.  Constitutional:      Appearance: He is underweight.  HENT:     Head: Normocephalic and atraumatic.  Cardiovascular:     Rate and Rhythm: Normal rate and regular rhythm.     Heart sounds: Normal heart sounds.  Pulmonary:     Effort: Pulmonary effort is normal.     Breath sounds: Normal breath sounds.  Abdominal:     General: Bowel sounds are normal. There is no distension.     Palpations: Abdomen is soft.     Tenderness: There is abdominal tenderness in the right lower quadrant and left lower quadrant. There is no guarding.  Musculoskeletal:     Cervical back: Normal range of motion.     Lumbar back: No bony tenderness.     Right lower leg: No edema.     Left lower leg: No edema.     Comments: Mild tenderness at bilateral lumbar paraspinal muscles  Skin:    General: Skin is warm and dry.  Neurological:     General: No focal deficit present.     Mental Status: He is alert and oriented to person, place, and time.  Psychiatric:        Mood and Affect: Mood normal.        Behavior: Behavior normal.    ED Results / Procedures / Treatments   Labs (all labs ordered are listed, but only abnormal results are displayed) Labs Reviewed  CBC WITH DIFFERENTIAL/PLATELET  - Abnormal; Notable for the following components:      Result Value   Platelets 134 (*)    All other components within normal limits  COMPREHENSIVE METABOLIC PANEL - Abnormal; Notable for the following components:   Glucose, Bld 129 (*)    BUN 5 (*)    Calcium 8.8 (*)    Alkaline Phosphatase 145 (*)    Total Bilirubin 0.1 (*)    All other components within normal limits  URINALYSIS, ROUTINE W REFLEX MICROSCOPIC - Abnormal; Notable for the following components:   Hgb urine dipstick LARGE (*)    Leukocytes,Ua TRACE (*)    All other components within normal limits  URINALYSIS, MICROSCOPIC (REFLEX) - Abnormal; Notable for the following components:   Bacteria, UA FEW (*)    All other components within normal limits  URINE CULTURE  LIPASE, BLOOD    EKG None  Radiology CT Abdomen Pelvis Wo Contrast  Result Date: 08/26/2020 CLINICAL DATA:  60 year old male with metastatic prostate cancer. Abdominal pain, low back pain, constipation. EXAM: CT ABDOMEN AND PELVIS WITHOUT CONTRAST TECHNIQUE: Multidetector CT imaging of the abdomen and pelvis was performed following the standard protocol without IV contrast. COMPARISON:  Recent PET-CT 08/14/2020. CT Abdomen and Pelvis 07/29/2017. FINDINGS: Lower chest: Mild lung base scarring. Mild centrilobular emphysema. No lung base pulmonary nodules. No cardiomegaly, pericardial or pleural effusion. Hepatobiliary: Negative noncontrast liver and gallbladder. Pancreas: Chronic round 3.3 cm simple density cystic lesion at the tail of the pancreas is stable since 04/09/2016 CT Abdomen and Pelvis , likely inconsequential. Spleen: Negative; occasional punctate chronic calcified splenic granulomas. Adrenals/Urinary Tract: Negative adrenal glands. Negative nonobstructed, noncontrast CT appearance of both kidneys. Decompressed bladder with moderate generalized bladder wall thickening. Incidental pelvic phleboliths. Stomach/Bowel: No dilated large or small bowel. No bowel  inflammation is evident. There is some flocculated material in the terminal ileum located in the midline anterior pelvis. Decompressed  stomach. No free air, free fluid. Vascular/Lymphatic: Normal caliber abdominal aorta. Mild to moderate iliac artery calcified atherosclerosis. Vascular patency is not evaluated in the absence of IV contrast. No lymphadenopathy. Reproductive: Negative. Other: No pelvic free fluid. Musculoskeletal: Diffuse osseous metastatic disease. No pathologic fracture identified. No discrete extraosseous extension of tumor is evident. IMPRESSION: 1. Diffuse osseous metastatic disease. Pathologic fracture or obvious extraosseous extension of tumor identified. 2. No other metastatic disease identified in the noncontrast abdomen or pelvis. 3. Nonspecific bladder wall thickening. 4. 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 Electronically Signed   By: Genevie Ann M.D.   On: 08/26/2020 10:33    Procedures Procedures   Medications Ordered in ED Medications  sodium chloride 0.9 % bolus 500 mL (0 mLs Intravenous Stopped 08/26/20 1059)  ondansetron (ZOFRAN) injection 4 mg (4 mg Intravenous Given 08/26/20 0939)  fentaNYL (SUBLIMAZE) injection 50 mcg (50 mcg Intravenous Given 08/26/20 0942)  HYDROmorphone (DILAUDID) injection 1 mg (1 mg Intravenous Given 08/26/20 1230)  ondansetron (ZOFRAN) injection 4 mg (4 mg Intravenous Given 08/26/20 1227)  sodium chloride 0.9 % bolus 500 mL ( Intravenous Stopped 08/26/20 1428)  HYDROmorphone (DILAUDID) injection 1 mg (1 mg Intravenous Given 08/26/20 1429)  cefTRIAXone (ROCEPHIN) 1 g in sodium chloride 0.9 % 100 mL IVPB (0 g Intravenous Stopped 08/26/20 1506)    ED Course  I have reviewed the triage vital signs and the nursing notes.  Pertinent labs & imaging results that were available during my care of the patient were reviewed by me and considered in my medical decision making (see chart for details).    MDM  Rules/Calculators/A&P                           AYINDE SWIM presents with abdominal pain.  He has a history of metastatic prostate cancer and is currently undergoing treatment.  He has also had this pain off and on over the past few months.  My differential diagnosis was initially, urologic pathology such as kidney stone, pyelonephritis, extension or worsening of his prostate cancer.  I was also concerned about possible intra-abdominal pathology such as abscess, obstruction, diverticulitis.  ED evaluation did not reveal acute pathology aside from a likely urinary tract infection.  I think some of his pain may be from constipation related to long-term opioid use.  I think some of his pain could be related to radicular pain from his bony mets.  He is already on pain medication, but I have increased it to Dilaudid.  He was advised not to take Dilaudid with Percocet.  He is on a Duragesic patch already.  I did give him another prescription for Narcan.  He was advised that he will need to speak with his oncologist in the event that he needs to increase his pain regimen on a more permanent basis as the ED is limited with the quantity of opioids that can be prescribed.  He will be discharged home with oral antibiotics.  We discussed very careful return precautions should his symptoms worsen or persist despite treatment. Final Clinical Impression(s) / ED Diagnoses Final diagnoses:  Lower urinary tract infectious disease  Prostate cancer metastatic to bone [facility])  Lower abdominal pain  Constipation, unspecified constipation type    Rx / DC Orders ED Discharge Orders          Ordered    cefdinir (OMNICEF) 300 MG capsule  2 times  daily        08/26/20 1507    HYDROmorphone (DILAUDID) 4 MG tablet  Every 6 hours PRN        08/26/20 1509    Naloxone HCl 0.4 MG/0.4ML SOAJ        08/26/20 1512             Arnaldo Natal, MD 08/26/20 [phone]

## 2020-08-27 ENCOUNTER — Encounter: Payer: Self-pay | Admitting: Hematology & Oncology

## 2020-08-27 ENCOUNTER — Ambulatory Visit: Payer: Medicare Other | Admitting: Hematology & Oncology

## 2020-08-27 ENCOUNTER — Inpatient Hospital Stay: Payer: Medicare Other | Attending: Hematology & Oncology

## 2020-08-27 ENCOUNTER — Inpatient Hospital Stay (HOSPITAL_BASED_OUTPATIENT_CLINIC_OR_DEPARTMENT_OTHER): Payer: Medicare Other | Admitting: Hematology & Oncology

## 2020-08-27 ENCOUNTER — Telehealth: Payer: Self-pay

## 2020-08-27 ENCOUNTER — Other Ambulatory Visit: Payer: Medicare Other

## 2020-08-27 VITALS — BP 111/74 | HR 66 | Temp 98.8°F | Resp 18 | Wt 159.0 lb

## 2020-08-27 DIAGNOSIS — Z5111 Encounter for antineoplastic chemotherapy: Secondary | ICD-10-CM | POA: Insufficient documentation

## 2020-08-27 DIAGNOSIS — C7951 Secondary malignant neoplasm of bone: Secondary | ICD-10-CM | POA: Diagnosis present

## 2020-08-27 DIAGNOSIS — C61 Malignant neoplasm of prostate: Secondary | ICD-10-CM

## 2020-08-27 DIAGNOSIS — Z79899 Other long term (current) drug therapy: Secondary | ICD-10-CM | POA: Insufficient documentation

## 2020-08-27 LAB — CBC WITH DIFFERENTIAL (CANCER CENTER ONLY)
Abs Immature Granulocytes: 0.07 10*3/uL (ref 0.00–0.07)
Basophils Absolute: 0 10*3/uL (ref 0.0–0.1)
Basophils Relative: 0 %
Eosinophils Absolute: 0.1 10*3/uL (ref 0.0–0.5)
Eosinophils Relative: 1 %
HCT: 41.5 % (ref 39.0–52.0)
Hemoglobin: 13.9 g/dL (ref 13.0–17.0)
Immature Granulocytes: 1 %
Lymphocytes Relative: 24 %
Lymphs Abs: 2.4 10*3/uL (ref 0.7–4.0)
MCH: 30.8 pg (ref 26.0–34.0)
MCHC: 33.5 g/dL (ref 30.0–36.0)
MCV: 91.8 fL (ref 80.0–100.0)
Monocytes Absolute: 0.7 10*3/uL (ref 0.1–1.0)
Monocytes Relative: 7 %
Neutro Abs: 6.7 10*3/uL (ref 1.7–7.7)
Neutrophils Relative %: 67 %
Platelet Count: 145 10*3/uL — ABNORMAL LOW (ref 150–400)
RBC: 4.52 MIL/uL (ref 4.22–5.81)
RDW: 14 % (ref 11.5–15.5)
WBC Count: 10 10*3/uL (ref 4.0–10.5)
nRBC: 0 % (ref 0.0–0.2)

## 2020-08-27 LAB — CMP (CANCER CENTER ONLY)
ALT: 8 U/L (ref 0–44)
AST: 10 U/L — ABNORMAL LOW (ref 15–41)
Albumin: 4.2 g/dL (ref 3.5–5.0)
Alkaline Phosphatase: 137 U/L — ABNORMAL HIGH (ref 38–126)
Anion gap: 7 (ref 5–15)
BUN: 5 mg/dL — ABNORMAL LOW (ref 6–20)
CO2: 28 mmol/L (ref 22–32)
Calcium: 9 mg/dL (ref 8.9–10.3)
Chloride: 102 mmol/L (ref 98–111)
Creatinine: 0.85 mg/dL (ref 0.61–1.24)
GFR, Estimated: >60 mL/min (ref >60)
Glucose, Bld: 129 mg/dL — ABNORMAL HIGH (ref 70–99)
Potassium: 4 mmol/L (ref 3.5–5.1)
Sodium: 137 mmol/L (ref 135–145)
Total Bilirubin: 0.4 mg/dL (ref 0.3–1.2)
Total Protein: 6.8 g/dL (ref 6.5–8.1)

## 2020-08-27 NOTE — Progress Notes (Signed)
ON PATHWAY REGIMEN - Prostate  No Change  Continue With Treatment as Ordered.  Original Decision Date/Time: 06/24/2017 13:35     A cycle is every 21 days:     Docetaxel      Prednisone   **Always confirm dose/schedule in your pharmacy ordering system**  Patient Characteristics: Adenocarcinoma, Metastatic, Castration Resistant, Symptomatic, Docetaxel Eligible Current radiographic evidence of distant metastasis<= Yes Histology: Adenocarcinoma AJCC T Category: cT4 Gleason Primary: 4 AJCC N Category: N1 Gleason Secondary: 5 AJCC M Category: M1c Gleason Score: 9 AJCC 8 Stage Grouping: IVB PSA Values (ng/mL): ? 20  Intent of Therapy: Non-Curative / Palliative Intent, Discussed with Patient

## 2020-08-27 NOTE — Progress Notes (Signed)
Hematology and Oncology Follow Up Visit  [facility] [id number] 04-Mar-1960 60 y.o. 08/27/2020   Principle Diagnosis:  Metastatic prostate cancer - androgen sensitive   Past Therapy: 14 fractions of radiation to the lumbar spine Casodex 50 mg by mouth daily - stopped 04/02/2017 due to progression  Zytiga 1000 mg by mouth daily/prednisone 10 mg by mouth daily - started on 04/20/2016 - d/c on 04/02/2017 Xtandi 160 mg po q day - started on 04/06/2017 -- d/c on 06/24/2017 Flutamide 250 mg po q8hr - started 07/08/2017 -- d/c on 04/23/18   Current Therapy:        Lupron 11.5 mg IM every 3 month - next dose 09/2020  Zometa 4 mg IV q 3 months - next dose 09/2020 Apalutamide 240 mg po q day -- started on 04/28/2018 Taxotere --  start cycle #1 on 09/05/2020   Interim History:  Mr. Culley is here today for follow-up.  Unfortunately, they were going to have to start chemotherapy on him.  He has tried for 3 or 4 years to hold off on chemotherapy.  His PSA is going up quickly.  His PSA only last saw him was up to 29.  We did go ahead and do a PET scan on him.  This was a PSMA scan.  This showed a lot of bony disease.  There is no disease in his organs.  Again, I just do not see how we can treat him without chemotherapy now  I explained to him that Taxotere is very active with prostate cancer.  I do think that we should get a response.  We should be able to see his PSA come down nicely.  He actually went to the emergency room yesterday morning.  He is having pain in his back.  He had a CT of the abdomen and pelvis.  This showed his bony metastasis.  He did not have any issue with his liver.  There is no obstruction.  His kidneys looked okay.  He is going to need to have a Port-A-Cath placed.  I explained to him what a Port-A-Cath is.  I explained why he would benefit from this.  He is in agreement.  His appetite is doing okay.  He has had no nausea or vomiting.  He has had no cough.  He has had no  issues with COVID.  Overall, his performance status is ECOG 1.    Medications:  Allergies as of 08/27/2020       Reactions   Contrast Media [iodinated Diagnostic Agents] Nausea And Vomiting   Patient vomits immediately after IV contrast is injected just prior to start of scan, was unable to scan patient in correct time frame.  Patient did not warn me of this when dosing the patient with oral CM and asking the patient history questions until the patient was on the table for the CT scan just prior to injection.  Ct Tech:  Karl Bales 07/29/17    Other Nausea And Vomiting   Patient vomits immediately after IV contrast is injected just prior to start of scan, was unable to scan patient in correct time frame.  Patient did not warn me of this when dosing the patient with oral CM and asking the patient history questions until the patient was on the table for the CT scan just prior to injection.  Ct Tech:  Karl Bales 07/29/17         Medication List        Accurate  as of August 27, 2020  2:33 PM. If you have any questions, ask your nurse or doctor.          acetaminophen 500 MG tablet Commonly known as: TYLENOL Take by mouth.   aspirin 81 MG chewable tablet Chew 1 tablet by mouth daily.   atorvastatin 10 MG tablet Commonly known as: LIPITOR TAKE 1 TABLET(10 MG) BY MOUTH DAILY   CALCIUM-VITAMIN D3 PO Take 1 tablet by mouth daily.   cefdinir 300 MG capsule Commonly known as: OMNICEF Take 1 capsule (300 mg total) by mouth 2 (two) times daily for 7 days.   dicyclomine 20 MG tablet Commonly known as: BENTYL Take 1 tablet (20 mg total) by mouth 2 (two) times daily.   diltiazem 240 MG 24 hr capsule Commonly known as: CARDIZEM CD TAKE 1 CAPSULE BY MOUTH EVERY DAY   dronabinol 5 MG capsule Commonly known as: MARINOL TAKE 1 CAPSULE(5 MG) BY MOUTH TWICE DAILY BEFORE A MEAL   Erleada 60 MG tablet Generic drug: apalutamide TAKE 4 TABLETS (240 MG TOTAL) BY MOUTH DAILY. MAY BE  TAKEN WITH OR WITHOUT FOOD. SWALLOW TABLETS WHOLE.   famotidine 20 MG tablet Commonly known as: PEPCID Take 1 tablet (20 mg total) by mouth 2 (two) times daily.   fentaNYL 100 MCG/HR Commonly known as: [place] 2 patches onto the skin every 3 (three) days.   gabapentin 800 MG tablet Commonly known as: NEURONTIN Take 800 mg by mouth 3 (three) times daily.   HYDROmorphone 4 MG tablet Commonly known as: Dilaudid Take 1 tablet (4 mg total) by mouth every 6 (six) hours as needed for up to 4 days for severe pain.   megestrol 625 MG/5ML suspension Commonly known as: MEGACE ES Take 5 mLs (625 mg total) by mouth 2 (two) times daily.   metoprolol tartrate 25 MG tablet Commonly known as: LOPRESSOR TAKE 1/2 TABLET(12.5 MG) BY MOUTH TWICE DAILY   Naloxone HCl 0.4 MG/0.4ML Soaj Use in the event of accidental overdose of opioids.   naloxone 4 MG/0.1ML Liqd nasal spray kit Commonly known as: NARCAN SMARTSIG:Both Nares   ondansetron 4 MG disintegrating tablet Commonly known as: Zofran ODT Take 1 tablet (4 mg total) by mouth every 8 (eight) hours as needed for nausea or vomiting.   ondansetron 4 MG tablet Commonly known as: ZOFRAN TAKE 1 TABLET (4 MG TOTAL) BY MOUTH EVERY 6 (SIX) HOURS AS NEEDED FOR NAUSEA OR VOMITING.   oxyCODONE-acetaminophen 10-325 MG tablet Commonly known as: Percocet Take 1 tablet by mouth every 4 (four) hours as needed for pain.   tamsulosin 0.4 MG Caps capsule Commonly known as: FLOMAX TAKE ONE CAPSULE BY MOUTH EVERY DAY        Allergies:  Allergies  Allergen Reactions   Contrast Media [Iodinated Diagnostic Agents] Nausea And Vomiting    Patient vomits immediately after IV contrast is injected just prior to start of scan, was unable to scan patient in correct time frame.  Patient did not warn me of this when dosing the patient with oral CM and asking the patient history questions until the patient was on the table for the CT scan just prior to  injection.  Ct Tech:  Karl Bales 07/29/17    Other Nausea And Vomiting    Patient vomits immediately after IV contrast is injected just prior to start of scan, was unable to scan patient in correct time frame.  Patient did not warn me of this when dosing the patient with oral  CM and asking the patient history questions until the patient was on the table for the CT scan just prior to injection.  Ct Tech:  Karl Bales 07/29/17     Past Medical History, Surgical history, Social history, and Family History were reviewed and updated.  Review of Systems: Review of Systems  Constitutional: Negative.   HENT: Negative.    Eyes: Negative.   Respiratory: Negative.    Cardiovascular: Negative.   Gastrointestinal: Negative.   Genitourinary: Negative.   Musculoskeletal:  Positive for back pain, joint pain and neck pain.  Skin: Negative.   Neurological: Negative.   Endo/Heme/Allergies: Negative.   Psychiatric/Behavioral: Negative.      Physical Exam:  weight is 159 lb (72.1 kg). His oral temperature is 98.8 F (37.1 C). His blood pressure is 111/74 and his pulse is 66. His respiration is 18 and oxygen saturation is 96%.   Wt Readings from Last 3 Encounters:  08/27/20 159 lb (72.1 kg)  08/26/20 160 lb (72.6 kg)  08/08/20 159 lb (72.1 kg)    Physical Exam Vitals reviewed.  HENT:     Head: Normocephalic and atraumatic.  Eyes:     Pupils: Pupils are equal, round, and reactive to light.  Cardiovascular:     Rate and Rhythm: Normal rate and regular rhythm.     Heart sounds: Normal heart sounds.  Pulmonary:     Effort: Pulmonary effort is normal.     Breath sounds: Normal breath sounds.  Abdominal:     General: Bowel sounds are normal.     Palpations: Abdomen is soft.  Musculoskeletal:        General: No tenderness or deformity. Normal range of motion.     Cervical back: Normal range of motion.  Lymphadenopathy:     Cervical: No cervical adenopathy.  Skin:    General: Skin is warm  and dry.     Findings: No erythema or rash.  Neurological:     Mental Status: He is alert and oriented to person, place, and time.  Psychiatric:        Behavior: Behavior normal.        Thought Content: Thought content normal.        Judgment: Judgment normal.    Lab Results  Component Value Date   WBC 10.0 08/27/2020   HGB 13.9 08/27/2020   HCT 41.5 08/27/2020   MCV 91.8 08/27/2020   PLT 145 (L) 08/27/2020   Lab Results  Component Value Date   FERRITIN 216 11/04/2017   IRON 77 11/04/2017   TIBC 261 11/04/2017   UIBC 184 11/04/2017   IRONPCTSAT 30 (L) 11/04/2017   Lab Results  Component Value Date   RBC 4.52 08/27/2020   Lab Results  Component Value Date   KAPLAMBRATIO 0.89 03/28/2016   Lab Results  Component Value Date   IGGSERUM 1,066 03/28/2016   IGMSERUM 106 03/28/2016   No results found for: Odetta Pink, SPEI   Chemistry      Component Value Date/Time   NA 137 08/27/2020 1211   NA 143 12/16/2016 1022   K 4.0 08/27/2020 1211   K 3.5 12/16/2016 1022   CL 102 08/27/2020 1211   CL 101 12/16/2016 1022   CO2 28 08/27/2020 1211   CO2 33 12/16/2016 1022   BUN 5 (L) 08/27/2020 1211   BUN 4 (L) 12/16/2016 1022   CREATININE 0.85 08/27/2020 1211   CREATININE 0.8 12/16/2016 1022  Component Value Date/Time   CALCIUM 9.0 08/27/2020 1211   CALCIUM 9.4 12/16/2016 1022   ALKPHOS 137 (H) 08/27/2020 1211   ALKPHOS 77 12/16/2016 1022   AST 10 (L) 08/27/2020 1211   ALT 8 08/27/2020 1211   ALT 16 12/16/2016 1022   BILITOT 0.4 08/27/2020 1211       Impression and Plan: Mr. Dains is a very pleasant 60 yo African American gentleman with metastatic prostate cancer.   His PSA clearly is going up.  His prostate cancer is more active.  We are going to have to start him on systemic chemotherapy.  Again, Taxotere would be considered standard.  I think he would respond to Taxotere.  We will see what his PSA level  is.  I would like to try to get started next week.  He will need Neulasta afterwards to help with respect to leukopenia.  I just hate that we have to do chemotherapy.  Again we waited as long as we possibly could.  He is in agreement with treatment.  I went over the side effects.  He understands the side effects.  He agrees to start treatment.  I will plan to see him back on the day of his second cycle of treatment.   [name], MD 8/1/20222:33 PM

## 2020-08-28 ENCOUNTER — Telehealth: Payer: Self-pay

## 2020-08-28 LAB — PSA, TOTAL AND FREE
PSA, Free Pct: 22.4 %
PSA, Free: 14.2 ng/mL
Prostate Specific Ag, Serum: 63.4 ng/mL — ABNORMAL HIGH (ref 0.0–4.0)

## 2020-08-28 LAB — URINE CULTURE: Culture: NO GROWTH

## 2020-08-28 LAB — TESTOSTERONE: Testosterone: 17 ng/dL — ABNORMAL LOW (ref 264–916)

## 2020-08-28 NOTE — Telephone Encounter (Signed)
Patient called stating he talked with his brother and wants to do radiation instead of chemo, that he does not want a port placed, after speaking with Dr.ennever called patient back and informed him chemo is necessary to start with that radiation would be done after chemo if needed. Patient agreed and agreed to get the port placed but would like to come in with his family prior to his port placement and chemo so they all understand what is going on and his options. Dr.Ennever okay working patient in this week. Scheduling message sent and scheduling will call patient to inform him of new appt made.

## 2020-08-28 NOTE — Progress Notes (Signed)
The following biosimilar Ziextenzo ([id number]) has been selected for use in this patient per insurance.  Henreitta Leber, PharmD 08/28/20 @ 1300

## 2020-08-29 ENCOUNTER — Encounter: Payer: Self-pay | Admitting: Hematology & Oncology

## 2020-08-29 ENCOUNTER — Other Ambulatory Visit: Payer: Self-pay

## 2020-08-29 ENCOUNTER — Inpatient Hospital Stay (HOSPITAL_BASED_OUTPATIENT_CLINIC_OR_DEPARTMENT_OTHER): Payer: Medicare Other | Admitting: Hematology & Oncology

## 2020-08-29 VITALS — BP 143/85 | HR 71 | Temp 98.8°F | Resp 20 | Wt 152.2 lb

## 2020-08-29 DIAGNOSIS — Z5111 Encounter for antineoplastic chemotherapy: Secondary | ICD-10-CM | POA: Diagnosis not present

## 2020-08-29 DIAGNOSIS — C61 Malignant neoplasm of prostate: Secondary | ICD-10-CM | POA: Diagnosis not present

## 2020-08-29 NOTE — Progress Notes (Signed)
Hematology and Oncology Follow Up Visit  JAEN SCHILDT [id number] 05-26-1960 60 y.o. 08/29/2020   Principle Diagnosis:  Metastatic prostate cancer - androgen sensitive   Past Therapy: 14 fractions of radiation to the lumbar spine Casodex 50 mg by mouth daily - stopped 04/02/2017 due to progression  Zytiga 1000 mg by mouth daily/prednisone 10 mg by mouth daily - started on 04/20/2016 - d/c on 04/02/2017 Xtandi 160 mg po q day - started on 04/06/2017 -- d/c on 06/24/2017 Flutamide 250 mg po q8hr - started 07/08/2017 -- d/c on 04/23/18   Current Therapy:        Lupron 11.5 mg IM every 3 month - next dose 09/2020  Zometa 4 mg IV q 3 months - next dose 09/2020 Apalutamide 240 mg po q day -- started on 04/28/2018 Taxotere --  start cycle #1 on 09/05/2020   Interim History:  Mr. Olcott is here today for follow-up.  Unfortunately, they were going to have to start chemotherapy on him.  He has tried for 3 or 4 years to hold off on chemotherapy.  His PSA is going up quickly.  His PSA only last saw him was up to 29.  We did go ahead and do a PET scan on him.  This was a PSMA scan.  This showed a lot of bony disease.  There is no disease in his organs.  Again, I just do not see how we can treat him without chemotherapy now  I explained to him that Taxotere is very active with prostate cancer.  I do think that we should get a response.  We should be able to see his PSA come down nicely.  He actually went to the emergency room yesterday morning.  He is having pain in his back.  He had a CT of the abdomen and pelvis.  This showed his bony metastasis.  He did not have any issue with his liver.  There is no obstruction.  His kidneys looked okay.  He is going to need to have a Port-A-Cath placed.  I explained to him what a Port-A-Cath is.  I explained why he would benefit from this.  He is in agreement.  His appetite is doing okay.  He has had no nausea or vomiting.  He has had no cough.  He has had no  issues with COVID.  Overall, his performance status is ECOG 1.    Medications:  Allergies as of 08/29/2020       Reactions   Contrast Media [iodinated Diagnostic Agents] Nausea And Vomiting   Patient vomits immediately after IV contrast is injected just prior to start of scan, was unable to scan patient in correct time frame.  Patient did not warn me of this when dosing the patient with oral CM and asking the patient history questions until the patient was on the table for the CT scan just prior to injection.  Ct Tech:  Karl Bales 07/29/17    Other Nausea And Vomiting   Patient vomits immediately after IV contrast is injected just prior to start of scan, was unable to scan patient in correct time frame.  Patient did not warn me of this when dosing the patient with oral CM and asking the patient history questions until the patient was on the table for the CT scan just prior to injection.  Ct Tech:  Karl Bales 07/29/17         Medication List        Accurate  as of August 29, 2020  4:58 PM. If you have any questions, ask your nurse or doctor.          acetaminophen 500 MG tablet Commonly known as: TYLENOL Take by mouth.   aspirin 81 MG chewable tablet Chew 1 tablet by mouth daily.   atorvastatin 10 MG tablet Commonly known as: LIPITOR TAKE 1 TABLET(10 MG) BY MOUTH DAILY   CALCIUM-VITAMIN D3 PO Take 1 tablet by mouth daily.   cefdinir 300 MG capsule Commonly known as: OMNICEF Take 1 capsule (300 mg total) by mouth 2 (two) times daily for 7 days.   dicyclomine 20 MG tablet Commonly known as: BENTYL Take 1 tablet (20 mg total) by mouth 2 (two) times daily.   diltiazem 240 MG 24 hr capsule Commonly known as: CARDIZEM CD TAKE 1 CAPSULE BY MOUTH EVERY DAY   dronabinol 5 MG capsule Commonly known as: MARINOL TAKE 1 CAPSULE(5 MG) BY MOUTH TWICE DAILY BEFORE A MEAL   Erleada 60 MG tablet Generic drug: apalutamide TAKE 4 TABLETS (240 MG TOTAL) BY MOUTH DAILY. MAY BE  TAKEN WITH OR WITHOUT FOOD. SWALLOW TABLETS WHOLE.   famotidine 20 MG tablet Commonly known as: PEPCID Take 1 tablet (20 mg total) by mouth 2 (two) times daily.   fentaNYL 100 MCG/HR Commonly known as: [place] 2 patches onto the skin every 3 (three) days.   gabapentin 800 MG tablet Commonly known as: NEURONTIN Take 800 mg by mouth 3 (three) times daily.   HYDROmorphone 4 MG tablet Commonly known as: Dilaudid Take 1 tablet (4 mg total) by mouth every 6 (six) hours as needed for up to 4 days for severe pain.   megestrol 625 MG/5ML suspension Commonly known as: MEGACE ES Take 5 mLs (625 mg total) by mouth 2 (two) times daily.   metoprolol tartrate 25 MG tablet Commonly known as: LOPRESSOR TAKE 1/2 TABLET(12.5 MG) BY MOUTH TWICE DAILY   Naloxone HCl 0.4 MG/0.4ML Soaj Use in the event of accidental overdose of opioids.   ondansetron 4 MG disintegrating tablet Commonly known as: Zofran ODT Take 1 tablet (4 mg total) by mouth every 8 (eight) hours as needed for nausea or vomiting.   ondansetron 4 MG tablet Commonly known as: ZOFRAN TAKE 1 TABLET (4 MG TOTAL) BY MOUTH EVERY 6 (SIX) HOURS AS NEEDED FOR NAUSEA OR VOMITING.   oxyCODONE-acetaminophen 10-325 MG tablet Commonly known as: Percocet Take 1 tablet by mouth every 4 (four) hours as needed for pain.   tamsulosin 0.4 MG Caps capsule Commonly known as: FLOMAX TAKE ONE CAPSULE BY MOUTH EVERY DAY        Allergies:  Allergies  Allergen Reactions   Contrast Media [Iodinated Diagnostic Agents] Nausea And Vomiting    Patient vomits immediately after IV contrast is injected just prior to start of scan, was unable to scan patient in correct time frame.  Patient did not warn me of this when dosing the patient with oral CM and asking the patient history questions until the patient was on the table for the CT scan just prior to injection.  Ct Tech:  Karl Bales 07/29/17    Other Nausea And Vomiting    Patient vomits  immediately after IV contrast is injected just prior to start of scan, was unable to scan patient in correct time frame.  Patient did not warn me of this when dosing the patient with oral CM and asking the patient history questions until the patient was on the table for  the CT scan just prior to injection.  Ct Tech:  Karl Bales 07/29/17     Past Medical History, Surgical history, Social history, and Family History were reviewed and updated.  Review of Systems: Review of Systems  Constitutional: Negative.   HENT: Negative.    Eyes: Negative.   Respiratory: Negative.    Cardiovascular: Negative.   Gastrointestinal: Negative.   Genitourinary: Negative.   Musculoskeletal:  Positive for back pain, joint pain and neck pain.  Skin: Negative.   Neurological: Negative.   Endo/Heme/Allergies: Negative.   Psychiatric/Behavioral: Negative.      Physical Exam:  weight is 152 lb 3.2 oz (69 kg). His oral temperature is 98.8 F (37.1 C). His blood pressure is 143/85 (abnormal) and his pulse is 71. His respiration is 20 and oxygen saturation is 100%.   Wt Readings from Last 3 Encounters:  08/29/20 152 lb 3.2 oz (69 kg)  08/27/20 159 lb (72.1 kg)  08/26/20 160 lb (72.6 kg)    Physical Exam Vitals reviewed.  HENT:     Head: Normocephalic and atraumatic.  Eyes:     Pupils: Pupils are equal, round, and reactive to light.  Cardiovascular:     Rate and Rhythm: Normal rate and regular rhythm.     Heart sounds: Normal heart sounds.  Pulmonary:     Effort: Pulmonary effort is normal.     Breath sounds: Normal breath sounds.  Abdominal:     General: Bowel sounds are normal.     Palpations: Abdomen is soft.  Musculoskeletal:        General: No tenderness or deformity. Normal range of motion.     Cervical back: Normal range of motion.  Lymphadenopathy:     Cervical: No cervical adenopathy.  Skin:    General: Skin is warm and dry.     Findings: No erythema or rash.  Neurological:      Mental Status: He is alert and oriented to person, place, and time.  Psychiatric:        Behavior: Behavior normal.        Thought Content: Thought content normal.        Judgment: Judgment normal.    Lab Results  Component Value Date   WBC 10.0 08/27/2020   HGB 13.9 08/27/2020   HCT 41.5 08/27/2020   MCV 91.8 08/27/2020   PLT 145 (L) 08/27/2020   Lab Results  Component Value Date   FERRITIN 216 11/04/2017   IRON 77 11/04/2017   TIBC 261 11/04/2017   UIBC 184 11/04/2017   IRONPCTSAT 30 (L) 11/04/2017   Lab Results  Component Value Date   RBC 4.52 08/27/2020   Lab Results  Component Value Date   KAPLAMBRATIO 0.89 03/28/2016   Lab Results  Component Value Date   IGGSERUM 1,066 03/28/2016   IGMSERUM 106 03/28/2016   No results found for: Odetta Pink, SPEI   Chemistry      Component Value Date/Time   NA 137 08/27/2020 1211   NA 143 12/16/2016 1022   K 4.0 08/27/2020 1211   K 3.5 12/16/2016 1022   CL 102 08/27/2020 1211   CL 101 12/16/2016 1022   CO2 28 08/27/2020 1211   CO2 33 12/16/2016 1022   BUN 5 (L) 08/27/2020 1211   BUN 4 (L) 12/16/2016 1022   CREATININE 0.85 08/27/2020 1211   CREATININE 0.8 12/16/2016 1022      Component Value Date/Time   CALCIUM 9.0 08/27/2020 1211  CALCIUM 9.4 12/16/2016 1022   ALKPHOS 137 (H) 08/27/2020 1211   ALKPHOS 77 12/16/2016 1022   AST 10 (L) 08/27/2020 1211   ALT 8 08/27/2020 1211   ALT 16 12/16/2016 1022   BILITOT 0.4 08/27/2020 1211       Impression and Plan: Mr. Piccini is a very pleasant 60 yo African American gentleman with metastatic prostate cancer.   His PSA clearly is going up.  His prostate cancer is more active.  We are going to have to start him on systemic chemotherapy.  Again, Taxotere would be considered standard.  I think he would respond to Taxotere.  We will see what his PSA level is.  I would like to try to get started next week.  He will  need Neulasta afterwards to help with respect to leukopenia.  I just hate that we have to do chemotherapy.  Again we waited as long as we possibly could.  He is in agreement with treatment.  I went over the side effects.  He understands the side effects.  He agrees to start treatment.  I will plan to see him back on the day of his second cycle of treatment.   [name], MD 8/3/20224:58 PM    ADDENDUM: Mr. Coniglio came back on 08/30/2019 with his family.  He was not sure he wanted treatment and had questions.  They had questions about treatment.  I explained to them that chemotherapy was our only option right now.  We could not use the targeted therapy because he has not had chemotherapy yet.  His PSA went from 23 up to 64.  This clearly indicates that his cancer is getting worse.  He is having more problems with respect to pain.  Again I am not surprised by this because of the increasing PSA.  I explained to them chemotherapy and why we need to use it.  I explained the side effects.  I went over the risk of infection.  I explained why we would give a "booster shot" to try to help with his low white cell count.  I explained the white cell count will still go down low but will not stay as low with the booster shot.  I explained why needed a Port-A-Cath.  He does not have good peripheral access.  If this chemotherapy got under the skin, it could cause a lot of skin damage.  As such, a Port-A-Cath really is mandatory.  I answered all of their questions.  I spent a good 45 minutes with his family.  They are very nice.  They clearly are very concerned about Mr. Petricca.  I appreciate their concern.  I told them that his PSA will tell us if he is responding.  If we do see response then we keep treating him.  If we do not see a response, then we can switch over to the nuclear medicine treatment-Pluvicto.  Mr. Modesto has decided to get treated.  We will keep him on schedule.  Lattie Haw,  MD

## 2020-08-30 ENCOUNTER — Telehealth: Payer: Self-pay | Admitting: Hematology & Oncology

## 2020-08-30 NOTE — Telephone Encounter (Signed)
No los 8/3 

## 2020-08-31 ENCOUNTER — Telehealth: Payer: Self-pay | Admitting: *Deleted

## 2020-08-31 MED ORDER — OXYCODONE HCL 20 MG PO TABS: 20.0000 mg | ORAL_TABLET | ORAL | 0 refills | Status: DC | PRN

## 2020-08-31 NOTE — Telephone Encounter (Signed)
Received call from [place] asking if Dr Marin Olp was going to send in a Rx for something for huim after his appt on 8.3.22.  Reviewed patients pain medicine with him.  Confirmed he is wearing 2 131mg Fentanyl patches.  Currently taking the Dilaudid 4 mg tablets prescribed for him in the ED.  Dr EMarin Olpwill send Oxycodone Rx.  Reiterated with patient not to take Oxycodone and Dilaudid together.  Patient Confirmed understanding

## 2020-08-31 NOTE — Addendum Note (Signed)
Addended by: Burney Gauze R on: 08/31/2020 05:15 PM   Modules accepted: Orders

## 2020-09-04 ENCOUNTER — Telehealth: Payer: Self-pay

## 2020-09-04 ENCOUNTER — Other Ambulatory Visit: Payer: Self-pay | Admitting: Internal Medicine

## 2020-09-04 NOTE — Telephone Encounter (Signed)
Patient called asking if he could switch the oxycodone back from '20mg'$  to the 10-325. States the '20mg'$  is making him sick. Called patient back and got his Vm, left him message informing him he could try cutting his '20mg'$  tablets in half and that will give him the same dose he was on previously and to call back if needed.

## 2020-09-05 ENCOUNTER — Other Ambulatory Visit: Payer: Self-pay | Admitting: Hematology & Oncology

## 2020-09-05 ENCOUNTER — Ambulatory Visit (HOSPITAL_COMMUNITY)
Admission: RE | Admit: 2020-09-05 | Discharge: 2020-09-05 | Disposition: A | Discharge status: Home / Self Care | Payer: Medicare Other | Source: Ambulatory Visit | Attending: Hematology & Oncology | Admitting: Hematology & Oncology

## 2020-09-05 ENCOUNTER — Encounter (HOSPITAL_COMMUNITY): Payer: Self-pay

## 2020-09-05 ENCOUNTER — Other Ambulatory Visit: Payer: Self-pay

## 2020-09-05 DIAGNOSIS — C61 Malignant neoplasm of prostate: Secondary | ICD-10-CM | POA: Insufficient documentation

## 2020-09-05 DIAGNOSIS — Z79899 Other long term (current) drug therapy: Secondary | ICD-10-CM | POA: Insufficient documentation

## 2020-09-05 DIAGNOSIS — Z91041 Radiographic dye allergy status: Secondary | ICD-10-CM | POA: Insufficient documentation

## 2020-09-05 DIAGNOSIS — Z87891 Personal history of nicotine dependence: Secondary | ICD-10-CM | POA: Diagnosis not present

## 2020-09-05 DIAGNOSIS — Z7982 Long term (current) use of aspirin: Secondary | ICD-10-CM | POA: Insufficient documentation

## 2020-09-05 HISTORY — PX: IR IMAGING GUIDED PORT INSERTION: IMG5740

## 2020-09-05 MED ORDER — LIDOCAINE-EPINEPHRINE 1 %-1:100000 IJ SOLN
INTRAMUSCULAR | Status: AC | PRN
  Administered 2020-09-05 (×2): 10 mL via INTRADERMAL
  Administered 2020-09-05: 5 mL via INTRADERMAL

## 2020-09-05 MED ORDER — FENTANYL CITRATE (PF) 100 MCG/2ML IJ SOLN
INTRAMUSCULAR | Status: AC
  Filled 2020-09-05: qty 2

## 2020-09-05 MED ORDER — SODIUM CHLORIDE 0.9 % IV SOLN: INTRAVENOUS | Status: DC

## 2020-09-05 MED ORDER — LIDOCAINE HCL 1 % IJ SOLN
INTRAMUSCULAR | Status: AC
  Filled 2020-09-05: qty 20

## 2020-09-05 MED ORDER — HEPARIN SOD (PORK) LOCK FLUSH 100 UNIT/ML IV SOLN
INTRAVENOUS | Status: AC | PRN
  Administered 2020-09-05: 500 [IU] via INTRAVENOUS

## 2020-09-05 MED ORDER — LIDOCAINE-EPINEPHRINE 1 %-1:100000 IJ SOLN
INTRAMUSCULAR | Status: AC
  Filled 2020-09-05: qty 1

## 2020-09-05 MED ORDER — MIDAZOLAM HCL 2 MG/2ML IJ SOLN
INTRAMUSCULAR | Status: AC
  Filled 2020-09-05: qty 4

## 2020-09-05 MED ORDER — HEPARIN SOD (PORK) LOCK FLUSH 100 UNIT/ML IV SOLN
INTRAVENOUS | Status: AC
  Filled 2020-09-05: qty 5

## 2020-09-05 MED ORDER — FENTANYL CITRATE (PF) 100 MCG/2ML IJ SOLN
INTRAMUSCULAR | Status: AC | PRN
  Administered 2020-09-05 (×2): 50 ug via INTRAVENOUS

## 2020-09-05 NOTE — Discharge Instructions (Signed)
Urgent needs - Interventional Radiology on call MD [phone]  Wound - May remove dressing and shower in 24 to 48 hours.  Keep site clean and dry.  Replace with bandaid as needed.  Do not submerge in tub or water until site healing well. If closed with glue, glue will flake off on its own.  If ordered by your provider, may start Emla cream in 2 weeks or after incision is healed.  After completion of treatment, your provider should have you set up for monthly port flushes.   

## 2020-09-05 NOTE — Procedures (Signed)
Interventional Radiology Procedure Note  Procedure: Placement of a right IJ approach single lumen PowerPort.  Tip is positioned at the superior cavoatrial junction and catheter is ready for immediate use.  Complications: None Recommendations:  - Ok to shower tomorrow - Do not submerge for 7 days - Routine line care   Signed,  Cozetta Seif S. Malikhi Ogan, DO   

## 2020-09-05 NOTE — Consult Note (Signed)
Chief Complaint: Patient was seen in consultation today for port a cath placement  Referring Physician(s): Ennever,Peter R  Supervising Physician: Corrie Mckusick  Patient Status: [facility] - Out-pt  History of Present Illness: Ryan Davis is a 60 y.o. male with PMH afib and metastatic prostate cancer, initially diagnosed in 2018 s/p radiation therapy, who presents now with rising PSA and worsening bony met disease on imaging. He has poor venous access and is scheduled today for port a cath placement for chemotherapy.   Past Medical History:  Diagnosis Date   A-fib [facility])    see 03-28-16 admission   Back pain without radiculopathy 03/28/2016   Dyspnea    endorses since treatment radiation , still gets SOB occasionally    Dysrhythmia    afib    Goals of care, counseling/discussion 06/24/2017   History of radiation therapy 09/16/16-10/06/16   35 Gy in 14 fractions to the lumbar spine   Nocturia    Prostate cancer metastatic to bone ([place]) 03/31/2016   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[place]) 03/31/2016    Past Surgical History:  Procedure Laterality Date   MULTIPLE EXTRACTIONS WITH ALVEOLOPLASTY N/A 11/19/2016   Procedure: EXTRACTION OF TOOTH #'S 1,3,5-7,9-17, AND 20- 29 WITH ALVEOLOPLASTY, BILATERAL MANDIBULAR TORI REDUCTIONS AND BILATERAL MAXILLARY BUCCAL EXOSTOSES REDUCTIONS;  Surgeon: Lenn Cal, DDS;  Location: [facility] ORS;  Service: Oral Surgery;  Laterality: N/A;   NO PAST SURGERIES      Allergies: Contrast media [iodinated diagnostic agents] and Other  Medications: Prior to Admission medications   Medication Sig Start Date End Date Taking? Authorizing Provider  aspirin 81 MG chewable tablet Chew 1 tablet by mouth daily. 04/09/17   [provider]  atorvastatin (LIPITOR) 10 MG tablet TAKE 1 TABLET(10 MG) BY MOUTH DAILY 03/27/20   [provider]  Calcium Carbonate-Vitamin D (CALCIUM-VITAMIN D3 PO) Take 1 tablet by mouth daily. Patient not taking:  Reported on 08/29/2020    [provider]  dicyclomine (BENTYL) 20 MG tablet Take 1 tablet (20 mg total) by mouth 2 (two) times daily. 08/08/20   Tedd Sias, PA  diltiazem (CARDIZEM CD) 240 MG 24 hr capsule TAKE 1 CAPSULE BY MOUTH EVERY DAY 05/14/20   [name], MD  famotidine (PEPCID) 20 MG tablet Take 1 tablet (20 mg total) by mouth 2 (two) times daily. 08/08/20   Tedd Sias, PA  fentaNYL (DURAGESIC) 100 MCG/HR Place 2 patches onto the skin every 3 (three) days. 08/03/20   [name], MD  metoprolol tartrate (LOPRESSOR) 25 MG tablet TAKE 1/2 TABLET(12.5 MG) BY MOUTH TWICE DAILY 03/27/20   [name], MD  Naloxone HCl 0.4 MG/0.4ML SOAJ Use in the event of accidental overdose of opioids. Patient not taking: Reported on 08/29/2020 08/26/20   Arnaldo Natal, MD  ondansetron (ZOFRAN ODT) 4 MG disintegrating tablet Take 1 tablet (4 mg total) by mouth every 8 (eight) hours as needed for nausea or vomiting. Patient not taking: Reported on 08/29/2020 08/08/20   Tedd Sias, PA  ondansetron (ZOFRAN) 4 MG tablet TAKE 1 TABLET (4 MG TOTAL) BY MOUTH EVERY 6 (SIX) HOURS AS NEEDED FOR NAUSEA OR VOMITING. Patient not taking: Reported on 08/29/2020 02/19/18   [place], Holli Humbles, NP  oxyCODONE 20 MG TABS Take 1 tablet (20 mg total) by mouth every 4 (four) hours as needed for severe pain. 08/31/20   [name], MD  tamsulosin (FLOMAX) 0.4 MG CAPS capsule TAKE ONE CAPSULE BY  MOUTH EVERY DAY 06/25/20   [name], MD     Family History  Problem Relation Age of Onset   Stroke Sister    Lupus Mother    Cancer Neg Hx    Heart disease Neg Hx    Diabetes Neg Hx     Social History   Socioeconomic History   Marital status: Married    Spouse name: Not on file   Number of children: Not on file   Years of education: Not on file   Highest education level: Not on file  Occupational History   Not on file  Tobacco Use   Smoking status: Former    Packs/day: 0.50    Years:  39.00    Pack years: 19.50    Types: Cigarettes   Smokeless tobacco: Never  Vaping Use   Vaping Use: Never used  Substance and Sexual Activity   Alcohol use: Not Currently   Drug use: No   Sexual activity: Not on file  Other Topics Concern   Not on file  Social History Narrative   Not on file   Social Determinants of Health   Financial Resource Strain: Not on file  Food Insecurity: Not on file  Transportation Needs: Not on file  Physical Activity: Not on file  Stress: Not on file  Social Connections: Not on file      Review of Systems denies fever,HA,CP,dyspnea, cough, N/V or bleeding; he has abd and back pain  Vital Signs: Vitals:   09/05/20 1003  BP: 127/86  Pulse: 76  Resp: 18  Temp: 98.4 F (36.9 C)  SpO2: 100%      Physical Exam awake/alert; chest- CTA bilat; heart- RRR; abd- soft,+BS, currently NT; no LE edema  Imaging: CT Abdomen Pelvis Wo Contrast  Result Date: 08/26/2020 CLINICAL DATA:  60 year old male with metastatic prostate cancer. Abdominal pain, low back pain, constipation. EXAM: CT ABDOMEN AND PELVIS WITHOUT CONTRAST TECHNIQUE: Multidetector CT imaging of the abdomen and pelvis was performed following the standard protocol without IV contrast. COMPARISON:  Recent PET-CT 08/14/2020. CT Abdomen and Pelvis 07/29/2017. FINDINGS: Lower chest: Mild lung base scarring. Mild centrilobular emphysema. No lung base pulmonary nodules. No cardiomegaly, pericardial or pleural effusion. Hepatobiliary: Negative noncontrast liver and gallbladder. Pancreas: Chronic round 3.3 cm simple density cystic lesion at the tail of the pancreas is stable since 04/09/2016 CT Abdomen and Pelvis , likely inconsequential. Spleen: Negative; occasional punctate chronic calcified splenic granulomas. Adrenals/Urinary Tract: Negative adrenal glands. Negative nonobstructed, noncontrast CT appearance of both kidneys. Decompressed bladder with moderate generalized bladder wall thickening.  Incidental pelvic phleboliths. Stomach/Bowel: No dilated large or small bowel. No bowel inflammation is evident. There is some flocculated material in the terminal ileum located in the midline anterior pelvis. Decompressed stomach. No free air, free fluid. Vascular/Lymphatic: Normal caliber abdominal aorta. Mild to moderate iliac artery calcified atherosclerosis. Vascular patency is not evaluated in the absence of IV contrast. No lymphadenopathy. Reproductive: Negative. Other: No pelvic free fluid. Musculoskeletal: Diffuse osseous metastatic disease. No pathologic fracture identified. No discrete extraosseous extension of tumor is evident. IMPRESSION: 1. Diffuse osseous metastatic disease. Pathologic fracture or obvious extraosseous extension of tumor identified. 2. No other metastatic disease identified in the noncontrast abdomen or pelvis. 3. Nonspecific bladder wall thickening. 4. 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 Electronically Signed   By: Genevie Ann M.D.   On: 08/26/2020 10:33   NM PET (PSMA) SKULL TO MID THIGH  Result Date: 08/15/2020 CLINICAL DATA:  Prostate carcinoma with biochemical recurrence. EXAM: NUCLEAR MEDICINE PET SKULL BASE TO THIGH TECHNIQUE: 0.2 mCi F18 Piflufolastat (Pylarify) was injected intravenously. Full-ring PET imaging was performed from the skull base to thigh after the radiotracer. CT data was obtained and used for attenuation correction and anatomic localization. COMPARISON:  [facility] PET scan 03/03/2017, CT 07/29/2017 FINDINGS: NECK Choose one Incidental CT finding: None CHEST No radiotracer accumulation within mediastinal or hilar lymph nodes. No suspicious pulmonary nodules on the CT scan. Incidental CT finding: None ABDOMEN/PELVIS Prostate: No focal activity in the prostate bed. Lymph nodes: No abnormal radiotracer accumulation within pelvic or abdominal nodes. Liver: No evidence of liver metastasis Incidental CT finding: None  SKELETON Diffuse multifocal intense radiotracer avid lesions throughout the axillary skeleton. Lesions too numerous to count in the pelvis, spine ribs. Example lesion in the L4 vertebral body with SUV max 8.5. Example lesion in the L1 vertebra with SUV max equal 18.6. These are large lesions occupying large portion of vertebral bodies lesions . Example RIGHT scapular lesion with SUV max equal 10.4 on image 72 There multiple underlying sclerotic skeletal lesions throughout the entire skeleton The number lesions visible has increased from [place] PET scan of 03/03/2017 Of note is most registration between the patent aside and CT data set through the pelvis. IMPRESSION: 1. Diffuse multifocal radiotracer avid prostate cancer skeletal metastasis. Lesion too numerous to count within the spine, pelvis ribs and shoulders 2. The number lesions visible on current scan is increased from [place] PET scan 03/03/2017. Diffuse sclerotic lesions on CT portion similar. 3. No evidence of nodal metastasis or visceral metastasis. Electronically Signed   By: Suzy Bouchard M.D.   On: 08/15/2020 16:31    Labs:  CBC: Recent Labs    07/12/20 1158 08/08/20 1420 08/26/20 0925 08/27/20 1157  WBC 9.0 9.3 8.6 10.0  HGB 14.7 15.4 14.2 13.9  HCT 44.1 45.4 41.1 41.5  PLT 146* 137* 134* 145*    COAGS: No results for input(s): INR, APTT in the last 8760 hours.  BMP: Recent Labs    10/12/19 1136 01/11/20 1119 07/12/20 1158 08/08/20 1420 08/26/20 0925 08/27/20 1211  NA 140   < > 139 135 138 137  K 3.8   < > 4.1 3.9 4.0 4.0  CL 102   < > 102 103 102 102  CO2 31   < > _0 GLUCOSE 111*   < > 116* 100* 129* 129*  BUN 7   < > 6 6 5* 5*  CALCIUM 9.3   < > 9.7 8.3* 8.8* 9.0  CREATININE 0.85   < > 0.91 0.85 0.77 0.85  GFRNONAA >60   < > >60 >60 >60 >60  GFRAA >60  --   --   --   --   --    < > = values in this interval not displayed.    LIVER FUNCTION TESTS: Recent Labs    07/12/20 1158 08/08/20 1420  08/26/20 0925 08/27/20 1211  BILITOT 0.4 0.3 0.1* 0.4  AST 15 14* 15 10*  ALT _1 ALKPHOS 106 119 145* 137*  PROT 6.7 6.6 7.0 6.8  ALBUMIN 4.5 3.8 4.0 4.2    TUMOR MARKERS: No results for input(s): AFPTM, CEA, CA199, CHROMGRNA in the last 8760 hours.  Assessment and Plan: 60 y.o. male with PMH afib and metastatic prostate cancer, initially diagnosed in 2018 with prior radiation, who presents now with rising  PSA and worsening bony met disease on imaging. He has poor venous access and is scheduled today for port a cath placement for chemotherapy. Risks and benefits of image guided port-a-catheter placement was discussed with the patient including, but not limited to bleeding, infection, pneumothorax, or fibrin sheath development and need for additional procedures.  All of the patient's questions were answered, patient is agreeable to proceed. Consent signed and in chart.    Thank you for this interesting consult.  I greatly enjoyed meeting Ryan Davis and look forward to participating in their care.  A copy of this report was sent to the requesting provider on this date.  Electronically Signed: D. Rowe Robert, PA-C 09/05/2020, 9:49 AM   I spent a total of  25 minutes   in face to face in clinical consultation, greater than 50% of which was counseling/coordinating care for port a cath placement

## 2020-09-06 ENCOUNTER — Other Ambulatory Visit: Payer: Medicare Other

## 2020-09-06 ENCOUNTER — Inpatient Hospital Stay: Payer: Medicare Other

## 2020-09-06 ENCOUNTER — Other Ambulatory Visit (HOSPITAL_BASED_OUTPATIENT_CLINIC_OR_DEPARTMENT_OTHER): Payer: Self-pay

## 2020-09-06 ENCOUNTER — Ambulatory Visit: Payer: Medicare Other

## 2020-09-06 VITALS — BP 133/79 | HR 77 | Temp 98.6°F | Resp 17

## 2020-09-06 DIAGNOSIS — Z5111 Encounter for antineoplastic chemotherapy: Secondary | ICD-10-CM | POA: Diagnosis not present

## 2020-09-06 DIAGNOSIS — C61 Malignant neoplasm of prostate: Secondary | ICD-10-CM

## 2020-09-06 DIAGNOSIS — C7951 Secondary malignant neoplasm of bone: Secondary | ICD-10-CM

## 2020-09-06 LAB — CBC WITH DIFFERENTIAL (CANCER CENTER ONLY)
Abs Immature Granulocytes: 0.05 10*3/uL (ref 0.00–0.07)
Basophils Absolute: 0 10*3/uL (ref 0.0–0.1)
Basophils Relative: 0 %
Eosinophils Absolute: 0.2 10*3/uL (ref 0.0–0.5)
Eosinophils Relative: 2 %
HCT: 40 % (ref 39.0–52.0)
Hemoglobin: 13.6 g/dL (ref 13.0–17.0)
Immature Granulocytes: 1 %
Lymphocytes Relative: 16 %
Lymphs Abs: 1.6 10*3/uL (ref 0.7–4.0)
MCH: 31 pg (ref 26.0–34.0)
MCHC: 34 g/dL (ref 30.0–36.0)
MCV: 91.1 fL (ref 80.0–100.0)
Monocytes Absolute: 0.7 10*3/uL (ref 0.1–1.0)
Monocytes Relative: 7 %
Neutro Abs: 7.6 10*3/uL (ref 1.7–7.7)
Neutrophils Relative %: 74 %
Platelet Count: 137 10*3/uL — ABNORMAL LOW (ref 150–400)
RBC: 4.39 MIL/uL (ref 4.22–5.81)
RDW: 13.9 % (ref 11.5–15.5)
WBC Count: 10.2 10*3/uL (ref 4.0–10.5)
nRBC: 0 % (ref 0.0–0.2)

## 2020-09-06 LAB — CMP (CANCER CENTER ONLY)
ALT: 14 U/L (ref 0–44)
AST: 13 U/L — ABNORMAL LOW (ref 15–41)
Albumin: 4 g/dL (ref 3.5–5.0)
Alkaline Phosphatase: 174 U/L — ABNORMAL HIGH (ref 38–126)
Anion gap: 8 (ref 5–15)
BUN: 9 mg/dL (ref 6–20)
CO2: 26 mmol/L (ref 22–32)
Calcium: 9 mg/dL (ref 8.9–10.3)
Chloride: 103 mmol/L (ref 98–111)
Creatinine: 0.76 mg/dL (ref 0.61–1.24)
GFR, Estimated: >60 mL/min (ref >60)
Glucose, Bld: 138 mg/dL — ABNORMAL HIGH (ref 70–99)
Potassium: 3.9 mmol/L (ref 3.5–5.1)
Sodium: 137 mmol/L (ref 135–145)
Total Bilirubin: 0.4 mg/dL (ref 0.3–1.2)
Total Protein: 6.2 g/dL — ABNORMAL LOW (ref 6.5–8.1)

## 2020-09-06 MED ORDER — SODIUM CHLORIDE 0.9 % IV SOLN
67.5000 mg/m2 | Freq: Once | INTRAVENOUS | Status: AC
  Administered 2020-09-06: 130 mg via INTRAVENOUS
  Filled 2020-09-06: qty 13

## 2020-09-06 MED ORDER — LORAZEPAM 0.5 MG PO TABS: 0.5000 mg | ORAL_TABLET | Freq: Four times a day (QID) | ORAL | 0 refills | Status: DC | PRN

## 2020-09-06 MED ORDER — ONDANSETRON HCL 8 MG PO TABS: 8.0000 mg | ORAL_TABLET | Freq: Two times a day (BID) | ORAL | 1 refills | Status: DC | PRN

## 2020-09-06 MED ORDER — LIDOCAINE-PRILOCAINE 2.5-2.5 % EX CREA
TOPICAL_CREAM | CUTANEOUS | 3 refills | Status: DC
Start: 2020-09-06 — End: 2020-11-12

## 2020-09-06 MED ORDER — SODIUM CHLORIDE 0.9% FLUSH
10.0000 mL | INTRAVENOUS | Status: DC | PRN
  Administered 2020-09-06: 10 mL
  Filled 2020-09-06: qty 10

## 2020-09-06 MED ORDER — PREDNISONE 5 MG PO TABS: 5.0000 mg | ORAL_TABLET | Freq: Two times a day (BID) | ORAL | 11 refills | Status: DC

## 2020-09-06 MED ORDER — SODIUM CHLORIDE 0.9 % IV SOLN
10.0000 mg | Freq: Once | INTRAVENOUS | Status: AC
  Administered 2020-09-06: 10 mg via INTRAVENOUS
  Filled 2020-09-06: qty 10

## 2020-09-06 MED ORDER — HEPARIN SOD (PORK) LOCK FLUSH 100 UNIT/ML IV SOLN
500.0000 [IU] | Freq: Once | INTRAVENOUS | Status: AC | PRN
  Administered 2020-09-06: 500 [IU]
  Filled 2020-09-06: qty 5

## 2020-09-06 MED ORDER — PROCHLORPERAZINE MALEATE 10 MG PO TABS: 10.0000 mg | ORAL_TABLET | Freq: Four times a day (QID) | ORAL | 1 refills | Status: DC | PRN

## 2020-09-06 MED ORDER — DEXAMETHASONE 4 MG PO TABS: 8.0000 mg | ORAL_TABLET | Freq: Two times a day (BID) | ORAL | 1 refills | Status: DC

## 2020-09-06 MED ORDER — SODIUM CHLORIDE 0.9 % IV SOLN
Freq: Once | INTRAVENOUS | Status: AC
  Filled 2020-09-06: qty 250

## 2020-09-06 NOTE — Patient Instructions (Addendum)
[facility] [place]  Discharge Instructions: Thank you for choosing [facility] to provide your oncology and hematology care.   If you have a lab appointment with the [facility], please go directly to the [facility] and check in at the registration area.  Wear comfortable clothing and clothing appropriate for easy access to any Portacath or PICC line.   We strive to give you quality time with your provider. You may need to reschedule your appointment if you arrive late (15 or more minutes).  Arriving late affects you and other patients whose appointments are after yours.  Also, if you miss three or more appointments without notifying the office, you may be dismissed from the clinic at the provider's discretion.      For prescription refill requests, have your pharmacy contact our office and allow 72 hours for refills to be completed.    Today you received the following chemotherapy and/or immunotherapy agents Taxotere      To help prevent nausea and vomiting after your treatment, we encourage you to take your nausea medication as directed.  BELOW ARE SYMPTOMS THAT SHOULD BE REPORTED IMMEDIATELY: *FEVER GREATER THAN 100.4 F (38 C) OR HIGHER *CHILLS OR SWEATING *NAUSEA AND VOMITING THAT IS NOT CONTROLLED WITH YOUR NAUSEA MEDICATION *UNUSUAL SHORTNESS OF BREATH *UNUSUAL BRUISING OR BLEEDING *URINARY PROBLEMS (pain or burning when urinating, or frequent urination) *BOWEL PROBLEMS (unusual diarrhea, constipation, pain near the anus) TENDERNESS IN MOUTH AND THROAT WITH OR WITHOUT PRESENCE OF ULCERS (sore throat, sores in mouth, or a toothache) UNUSUAL RASH, SWELLING OR PAIN  UNUSUAL VAGINAL DISCHARGE OR ITCHING   Items with * indicate a potential emergency and should be followed up as soon as possible or go to the Emergency Department if any problems should occur.  Please show the CHEMOTHERAPY ALERT CARD or IMMUNOTHERAPY ALERT CARD at check-in to the  Emergency Department and triage nurse. Should you have questions after your visit or need to cancel or reschedule your appointment, please contact [facility]  [phone] and follow the prompts.  Office hours are 8:00 a.m. to 4:30 p.m. Monday - Friday. Please note that voicemails left after 4:00 p.m. may not be returned until the following business day.  We are closed weekends and major holidays. You have access to a nurse at all times for urgent questions. Please call the main number to the clinic [phone] and follow the prompts.  For any non-urgent questions, you may also contact your provider using MyChart. We now offer e-Visits for anyone 22 and older to request care online for non-urgent symptoms. For details visit [email or web address].   Also download the MyChart app! Go to the app store, search "MyChart", open the app, select , and log in with your MyChart username and password.  Due to Covid, a mask is required upon entering the hospital/clinic. If you do not have a mask, one will be given to you upon arrival. For doctor visits, patients may have 1 support person aged 92 or older with them. For treatment visits, patients cannot have anyone with them due to current Covid guidelines and our immunocompromised population.   Docetaxel injection What is this medication? DOCETAXEL (doe se TAX el) is a chemotherapy drug. It targets fast dividing cells, like cancer cells, and causes these cells to die. This medicine is used to treat many types of cancers like breast cancer, certain stomach cancers,head and neck cancer, lung cancer, and prostate cancer.  This medicine may be used for other purposes; ask your health care provider orpharmacist if you have questions. COMMON BRAND NAME(S): Docefrez, Taxotere What should I tell my care team before I take this medication? They need to know if you have any of these conditions: infection (especially a virus  infection such as chickenpox, cold sores, or herpes) liver disease low blood counts, like low white cell, platelet, or red cell counts an unusual or allergic reaction to docetaxel, polysorbate 80, other chemotherapy agents, other medicines, foods, dyes, or preservatives pregnant or trying to get pregnant breast-feeding How should I use this medication? This drug is given as an infusion into a vein. It is administered in a hospitalor clinic by a specially trained health care professional. Talk to your pediatrician regarding the use of this medicine in children.Special care may be needed. Overdosage: If you think you have taken too much of this medicine contact apoison control center or emergency room at once. NOTE: This medicine is only for you. Do not share this medicine with others. What if I miss a dose? It is important not to miss your dose. Call your doctor or health careprofessional if you are unable to keep an appointment. What may interact with this medication? Do not take this medicine with any of the following medications: live virus vaccines This medicine may also interact with the following medications: aprepitant certain antibiotics like erythromycin or clarithromycin certain antivirals for HIV or hepatitis certain medicines for fungal infections like fluconazole, itraconazole, ketoconazole, posaconazole, or voriconazole cimetidine ciprofloxacin conivaptan cyclosporine dronedarone fluvoxamine grapefruit juice imatinib verapamil This list may not describe all possible interactions. Give your health care provider a list of all the medicines, herbs, non-prescription drugs, or dietary supplements you use. Also tell them if you smoke, drink alcohol, or use illegaldrugs. Some items may interact with your medicine. What should I watch for while using this medication? Your condition will be monitored carefully while you are receiving this medicine. You will need important blood  work done while you are taking thismedicine. Call your doctor or health care professional for advice if you get a fever, chills or sore throat, or other symptoms of a cold or flu. Do not treat yourself. This drug decreases your body's ability to fight infections. Try toavoid being around people who are sick. Some products may contain alcohol. Ask your health care professional if this medicine contains alcohol. Be sure to tell all health care professionals you are taking this medicine. Certain medicines, like metronidazole and disulfiram, can cause an unpleasant reaction when taken with alcohol. The reaction includes flushing, headache, nausea, vomiting, sweating, and increased thirst. Thereaction can last from 30 minutes to several hours. You may get drowsy or dizzy. Do not drive, use machinery, or do anything that needs mental alertness until you know how this medicine affects you. Do not stand or sit up quickly, especially if you are an older patient. This reduces the risk of dizzy or fainting spells. Alcohol may interfere with the effect ofthis medicine. Talk to your health care professional about your risk of cancer. You may bemore at risk for certain types of cancer if you take this medicine. Do not become pregnant while taking this medicine or for 6 months after stopping it. Women should inform their doctor if they wish to become pregnant or think they might be pregnant. There is a potential for serious side effects to an unborn child. Talk to your health care professional or pharmacist for more information. Do not  breast-feed an infant while taking this medicine orfor 1 week after stopping it. Males who get this medicine must use a condom during sex with females who can get pregnant. If you get a woman pregnant, the baby could have birth defects. The baby could die before they are born. You will need to continue wearing a condom for 3 months after stopping the medicine. Tell your health care  providerright away if your partner becomes pregnant while you are taking this medicine. This may interfere with the ability to father a child. You should talk to yourdoctor or health care professional if you are concerned about your fertility. What side effects may I notice from receiving this medication? Side effects that you should report to your doctor or health care professionalas soon as possible: allergic reactions like skin rash, itching or hives, swelling of the face, lips, or tongue blurred vision breathing problems changes in vision low blood counts - This drug may decrease the number of white blood cells, red blood cells and platelets. You may be at increased risk for infections and bleeding. nausea and vomiting pain, redness or irritation at site where injected pain, tingling, numbness in the hands or feet redness, blistering, peeling, or loosening of the skin, including inside the mouth signs of decreased platelets or bleeding - bruising, pinpoint red spots on the skin, black, tarry stools, nosebleeds signs of decreased red blood cells - unusually weak or tired, fainting spells, lightheadedness signs of infection - fever or chills, cough, sore throat, pain or difficulty passing urine swelling of the ankle, feet, hands Side effects that usually do not require medical attention (report to yourdoctor or health care professional if they continue or are bothersome): constipation diarrhea fingernail or toenail changes hair loss loss of appetite mouth sores muscle pain This list may not describe all possible side effects. Call your doctor for medical advice about side effects. You may report side effects to FDA at1-800-FDA-1088. Where should I keep my medication? This drug is given in a hospital or clinic and will not be stored at home. NOTE: This sheet is a summary. It may not cover all possible information. If you have questions about this medicine, talk to your doctor,  pharmacist, orhealth care provider.  2022 Elsevier/Gold Standard (2018-12-13 19:50:31)

## 2020-09-06 NOTE — Patient Instructions (Signed)
Implanted Port Home Guide An implanted port is a device that is placed under the skin. It is usually placed in the chest. The device can be used to give IV medicine, to take blood, or for dialysis. You may have an implanted port if: You need IV medicine that would be irritating to the small veins in your hands or arms. You need IV medicines, such as antibiotics, for a long period of time. You need IV nutrition for a long period of time. You need dialysis. When you have a port, your health care provider can choose to use the port instead of veins in your arms for these procedures. You may have fewer limitations when using a port than you would if you used other types of long-term IVs, and you will likely be able to return to normal activities afteryour incision heals. An implanted port has two main parts: Reservoir. The reservoir is the part where a needle is inserted to give medicines or draw blood. The reservoir is round. After it is placed, it appears as a small, raised area under your skin. Catheter. The catheter is a thin, flexible tube that connects the reservoir to a vein. Medicine that is inserted into the reservoir goes into the catheter and then into the vein. How is my port accessed? To access your port: A numbing cream may be placed on the skin over the port site. Your health care provider will put on a mask and sterile gloves. The skin over your port will be cleaned carefully with a germ-killing soap and allowed to dry. Your health care provider will gently pinch the port and insert a needle into it. Your health care provider will check for a blood return to make sure the port is in the vein and is not clogged. If your port needs to remain accessed to get medicine continuously (constant infusion), your health care provider will place a clear bandage (dressing) over the needle site. The dressing and needle will need to be changed every week, or as told by your health care provider. What  is flushing? Flushing helps keep the port from getting clogged. Follow instructions from your health care provider about how and when to flush the port. Ports are usually flushed with saline solution or a medicine called heparin. The need for flushing will depend on how the port is used: If the port is only used from time to time to give medicines or draw blood, the port may need to be flushed: Before and after medicines have been given. Before and after blood has been drawn. As part of routine maintenance. Flushing may be recommended every 4-6 weeks. If a constant infusion is running, the port may not need to be flushed. Throw away any syringes in a disposal container that is meant for sharp items (sharps container). You can buy a sharps container from a pharmacy, or you can make one by using an empty hard plastic bottle with a cover. How long will my port stay implanted? The port can stay in for as long as your health care provider thinks it is needed. When it is time for the port to come out, a surgery will be done to remove it. The surgery will be similar to the procedure that was done to putthe port in. Follow these instructions at home:  Flush your port as told by your health care provider. If you need an infusion over several days, follow instructions from your health care provider about how to take   care of your port site. Make sure you: Wash your hands with soap and water before you change your dressing. If soap and water are not available, use alcohol-based hand sanitizer. Change your dressing as told by your health care provider. Place any used dressings or infusion bags into a plastic bag. Throw that bag in the trash. Keep the dressing that covers the needle clean and dry. Do not get it wet. Do not use scissors or sharp objects near the tube. Keep the tube clamped, unless it is being used. Check your port site every day for signs of infection. Check for: Redness, swelling, or  pain. Fluid or blood. Pus or a bad smell. Protect the skin around the port site. Avoid wearing bra straps that rub or irritate the site. Protect the skin around your port from seat belts. Place a soft pad over your chest if needed. Bathe or shower as told by your health care provider. The site may get wet as long as you are not actively receiving an infusion. Return to your normal activities as told by your health care provider. Ask your health care provider what activities are safe for you. Carry a medical alert card or wear a medical alert bracelet at all times. This will let health care providers know that you have an implanted port in case of an emergency. Get help right away if: You have redness, swelling, or pain at the port site. You have fluid or blood coming from your port site. You have pus or a bad smell coming from the port site. You have a fever. Summary Implanted ports are usually placed in the chest for long-term IV access. Follow instructions from your health care provider about flushing the port and changing bandages (dressings). Take care of the area around your port by avoiding clothing that puts pressure on the area, and by watching for signs of infection. Protect the skin around your port from seat belts. Place a soft pad over your chest if needed. Get help right away if you have a fever or you have redness, swelling, pain, drainage, or a bad smell at the port site. This information is not intended to replace advice given to you by your health care provider. Make sure you discuss any questions you have with your healthcare provider. Document Revised: 05/30/2019 Document Reviewed: 05/30/2019 Elsevier Patient Education  2022 [employer]  

## 2020-09-07 ENCOUNTER — Telehealth: Payer: Self-pay | Admitting: *Deleted

## 2020-09-07 ENCOUNTER — Inpatient Hospital Stay: Payer: Medicare Other

## 2020-09-07 ENCOUNTER — Ambulatory Visit: Payer: Medicare Other

## 2020-09-07 LAB — PSA, TOTAL AND FREE
PSA, Free Pct: 27 %
PSA, Free: 20 ng/mL
Prostate Specific Ag, Serum: 74 ng/mL — ABNORMAL HIGH (ref 0.0–4.0)

## 2020-09-07 LAB — TESTOSTERONE: Testosterone: 11 ng/dL — ABNORMAL LOW (ref 264–916)

## 2020-09-07 NOTE — Telephone Encounter (Signed)
Called patient to let him know that his WBC booster shot was denied by insurance and cancelled his appt.  Patient told me the medicine he took last night made him sick all night long.  Reviewed patients nausea medication with him.  The medication he was referring to is Ondansetron.  Reminded patient this is a nausea medication to help with nausea.  Advised patient to take Compazine or Ativan for nausea and to start the Dexamethasone for 2 days beginning today.   Patient said he will try this.  Reassured patient about his experience so far and told him to call us with any further issues

## 2020-09-07 NOTE — Progress Notes (Signed)
Insurance denied Ziextenzo   Removed day 3 from treatment plan.  Henreitta Leber, PharmD

## 2020-09-12 ENCOUNTER — Other Ambulatory Visit (HOSPITAL_COMMUNITY): Payer: Self-pay

## 2020-09-23 ENCOUNTER — Other Ambulatory Visit: Payer: Self-pay | Admitting: Hematology & Oncology

## 2020-09-23 DIAGNOSIS — I4891 Unspecified atrial fibrillation: Secondary | ICD-10-CM

## 2020-09-23 DIAGNOSIS — C61 Malignant neoplasm of prostate: Secondary | ICD-10-CM

## 2020-09-23 DIAGNOSIS — C7951 Secondary malignant neoplasm of bone: Secondary | ICD-10-CM

## 2020-09-24 ENCOUNTER — Encounter: Payer: Self-pay | Admitting: Hematology & Oncology

## 2020-09-26 ENCOUNTER — Telehealth: Payer: Self-pay | Admitting: *Deleted

## 2020-09-26 NOTE — Telephone Encounter (Addendum)
Call received from patient stating that he is having "sharp abdominal pain 10/10 since last night at 8PM and has not slept all night."  Pt denies any n/v, chills, fevers, SOB and states that he had a BM on 09/24/20 with no difficulties. Dr. Marin Olp notified.  Pt notified per order of Dr. Marin Olp to go to the [facility] ER now.  Pt states that he will go to the ER now.

## 2020-09-27 ENCOUNTER — Inpatient Hospital Stay: Payer: Medicare Other | Attending: Hematology & Oncology

## 2020-09-27 ENCOUNTER — Inpatient Hospital Stay: Payer: Medicare Other

## 2020-09-27 ENCOUNTER — Inpatient Hospital Stay (HOSPITAL_BASED_OUTPATIENT_CLINIC_OR_DEPARTMENT_OTHER): Payer: Medicare Other | Admitting: Hematology & Oncology

## 2020-09-27 ENCOUNTER — Other Ambulatory Visit: Payer: Self-pay

## 2020-09-27 ENCOUNTER — Other Ambulatory Visit: Payer: Self-pay | Admitting: *Deleted

## 2020-09-27 ENCOUNTER — Encounter: Payer: Self-pay | Admitting: Hematology & Oncology

## 2020-09-27 ENCOUNTER — Telehealth: Payer: Self-pay

## 2020-09-27 VITALS — BP 118/80 | HR 74 | Temp 98.6°F | Resp 18 | Wt 153.1 lb

## 2020-09-27 DIAGNOSIS — M5441 Lumbago with sciatica, right side: Secondary | ICD-10-CM

## 2020-09-27 DIAGNOSIS — Z5111 Encounter for antineoplastic chemotherapy: Secondary | ICD-10-CM | POA: Insufficient documentation

## 2020-09-27 DIAGNOSIS — C7951 Secondary malignant neoplasm of bone: Secondary | ICD-10-CM | POA: Diagnosis not present

## 2020-09-27 DIAGNOSIS — C61 Malignant neoplasm of prostate: Secondary | ICD-10-CM | POA: Insufficient documentation

## 2020-09-27 DIAGNOSIS — M5442 Lumbago with sciatica, left side: Secondary | ICD-10-CM | POA: Diagnosis not present

## 2020-09-27 DIAGNOSIS — G8929 Other chronic pain: Secondary | ICD-10-CM

## 2020-09-27 LAB — CBC WITH DIFFERENTIAL (CANCER CENTER ONLY)
Abs Immature Granulocytes: 0.17 10*3/uL — ABNORMAL HIGH (ref 0.00–0.07)
Basophils Absolute: 0.1 10*3/uL (ref 0.0–0.1)
Basophils Relative: 0 %
Eosinophils Absolute: 0 10*3/uL (ref 0.0–0.5)
Eosinophils Relative: 0 %
HCT: 39.7 % (ref 39.0–52.0)
Hemoglobin: 13 g/dL (ref 13.0–17.0)
Immature Granulocytes: 1 %
Lymphocytes Relative: 17 %
Lymphs Abs: 2.5 10*3/uL (ref 0.7–4.0)
MCH: 30.1 pg (ref 26.0–34.0)
MCHC: 32.7 g/dL (ref 30.0–36.0)
MCV: 91.9 fL (ref 80.0–100.0)
Monocytes Absolute: 1.1 10*3/uL — ABNORMAL HIGH (ref 0.1–1.0)
Monocytes Relative: 8 %
Neutro Abs: 10.5 10*3/uL — ABNORMAL HIGH (ref 1.7–7.7)
Neutrophils Relative %: 74 %
Platelet Count: 157 10*3/uL (ref 150–400)
RBC: 4.32 MIL/uL (ref 4.22–5.81)
RDW: 14.6 % (ref 11.5–15.5)
WBC Count: 14.3 10*3/uL — ABNORMAL HIGH (ref 4.0–10.5)
nRBC: 0 % (ref 0.0–0.2)

## 2020-09-27 LAB — COMPREHENSIVE METABOLIC PANEL
ALT: 11 U/L (ref 0–44)
AST: 18 U/L (ref 15–41)
Albumin: 4 g/dL (ref 3.5–5.0)
Alkaline Phosphatase: 562 U/L — ABNORMAL HIGH (ref 38–126)
Anion gap: 8 (ref 5–15)
BUN: 10 mg/dL (ref 6–20)
CO2: 24 mmol/L (ref 22–32)
Calcium: 7.9 mg/dL — ABNORMAL LOW (ref 8.9–10.3)
Chloride: 104 mmol/L (ref 98–111)
Creatinine, Ser: 0.7 mg/dL (ref 0.61–1.24)
GFR, Estimated: >60 mL/min (ref >60)
Glucose, Bld: 144 mg/dL — ABNORMAL HIGH (ref 70–99)
Potassium: 3.8 mmol/L (ref 3.5–5.1)
Sodium: 136 mmol/L (ref 135–145)
Total Bilirubin: 0.6 mg/dL (ref 0.3–1.2)
Total Protein: 6.8 g/dL (ref 6.5–8.1)

## 2020-09-27 MED ORDER — HEPARIN SOD (PORK) LOCK FLUSH 100 UNIT/ML IV SOLN
500.0000 [IU] | Freq: Once | INTRAVENOUS | Status: AC | PRN
  Administered 2020-09-27: 500 [IU]

## 2020-09-27 MED ORDER — SODIUM CHLORIDE 0.9% FLUSH
10.0000 mL | INTRAVENOUS | Status: DC | PRN
  Administered 2020-09-27: 10 mL

## 2020-09-27 MED ORDER — SODIUM CHLORIDE 0.9 % IV SOLN
10.0000 mg | Freq: Once | INTRAVENOUS | Status: AC
  Administered 2020-09-27: 10 mg via INTRAVENOUS
  Filled 2020-09-27: qty 10

## 2020-09-27 MED ORDER — FENTANYL 100 MCG/HR TD PT72: 2.0000 | MEDICATED_PATCH | TRANSDERMAL | 0 refills | Status: DC

## 2020-09-27 MED ORDER — SODIUM CHLORIDE 0.9 % IV SOLN
67.5000 mg/m2 | Freq: Once | INTRAVENOUS | Status: AC
  Administered 2020-09-27: 130 mg via INTRAVENOUS
  Filled 2020-09-27: qty 13

## 2020-09-27 MED ORDER — SODIUM CHLORIDE 0.9 % IV SOLN: Freq: Once | INTRAVENOUS | Status: AC

## 2020-09-27 MED ORDER — FAMOTIDINE 40 MG PO TABS: 40.0000 mg | ORAL_TABLET | Freq: Two times a day (BID) | ORAL | 6 refills | Status: DC

## 2020-09-27 MED ORDER — FAMOTIDINE 200 MG/20ML IV SOLN
40.0000 mg | Freq: Once | INTRAVENOUS | Status: AC
  Administered 2020-09-27: 40 mg via INTRAVENOUS
  Filled 2020-09-27: qty 4

## 2020-09-27 NOTE — Patient Instructions (Signed)
Implanted Port Home Guide An implanted port is a device that is placed under the skin. It is usually placed in the chest. The device can be used to give IV medicine, to take blood, or for dialysis. You may have an implanted port if: You need IV medicine that would be irritating to the small veins in your hands or arms. You need IV medicines, such as antibiotics, for a long period of time. You need IV nutrition for a long period of time. You need dialysis. When you have a port, your health care provider can choose to use the port instead of veins in your arms for these procedures. You may have fewer limitations when using a port than you would if you used other types of long-term IVs, and you will likely be able to return to normal activities after your incision heals. An implanted port has two main parts: Reservoir. The reservoir is the part where a needle is inserted to give medicines or draw blood. The reservoir is round. After it is placed, it appears as a small, raised area under your skin. Catheter. The catheter is a thin, flexible tube that connects the reservoir to a vein. Medicine that is inserted into the reservoir goes into the catheter and then into the vein. How is my port accessed? To access your port: A numbing cream may be placed on the skin over the port site. Your health care provider will put on a mask and sterile gloves. The skin over your port will be cleaned carefully with a germ-killing soap and allowed to dry. Your health care provider will gently pinch the port and insert a needle into it. Your health care provider will check for a blood return to make sure the port is in the vein and is not clogged. If your port needs to remain accessed to get medicine continuously (constant infusion), your health care provider will place a clear bandage (dressing) over the needle site. The dressing and needle will need to be changed every week, or as told by your health care provider. What  is flushing? Flushing helps keep the port from getting clogged. Follow instructions from your health care provider about how and when to flush the port. Ports are usually flushed with saline solution or a medicine called heparin. The need for flushing will depend on how the port is used: If the port is only used from time to time to give medicines or draw blood, the port may need to be flushed: Before and after medicines have been given. Before and after blood has been drawn. As part of routine maintenance. Flushing may be recommended every 4-6 weeks. If a constant infusion is running, the port may not need to be flushed. Throw away any syringes in a disposal container that is meant for sharp items (sharps container). You can buy a sharps container from a pharmacy, or you can make one by using an empty hard plastic bottle with a cover. How long will my port stay implanted? The port can stay in for as long as your health care provider thinks it is needed. When it is time for the port to come out, a surgery will be done to remove it. The surgery will be similar to the procedure that was done to put the port in. Follow these instructions at home:  Flush your port as told by your health care provider. If you need an infusion over several days, follow instructions from your health care provider about how   to take care of your port site. Make sure you: Wash your hands with soap and water before you change your dressing. If soap and water are not available, use alcohol-based hand sanitizer. Change your dressing as told by your health care provider. Place any used dressings or infusion bags into a plastic bag. Throw that bag in the trash. Keep the dressing that covers the needle clean and dry. Do not get it wet. Do not use scissors or sharp objects near the tube. Keep the tube clamped, unless it is being used. Check your port site every day for signs of infection. Check for: Redness, swelling, or  pain. Fluid or blood. Pus or a bad smell. Protect the skin around the port site. Avoid wearing bra straps that rub or irritate the site. Protect the skin around your port from seat belts. Place a soft pad over your chest if needed. Bathe or shower as told by your health care provider. The site may get wet as long as you are not actively receiving an infusion. Return to your normal activities as told by your health care provider. Ask your health care provider what activities are safe for you. Carry a medical alert card or wear a medical alert bracelet at all times. This will let health care providers know that you have an implanted port in case of an emergency. Get help right away if: You have redness, swelling, or pain at the port site. You have fluid or blood coming from your port site. You have pus or a bad smell coming from the port site. You have a fever. Summary Implanted ports are usually placed in the chest for long-term IV access. Follow instructions from your health care provider about flushing the port and changing bandages (dressings). Take care of the area around your port by avoiding clothing that puts pressure on the area, and by watching for signs of infection. Protect the skin around your port from seat belts. Place a soft pad over your chest if needed. Get help right away if you have a fever or you have redness, swelling, pain, drainage, or a bad smell at the port site. This information is not intended to replace advice given to you by your health care provider. Make sure you discuss any questions you have with your health care provider. Document Revised: 04/04/2020 Document Reviewed: 05/30/2019 Elsevier Patient Education  2022 Elsevier Inc.  

## 2020-09-27 NOTE — Progress Notes (Signed)
Hematology and Oncology Follow Up Visit  KELLAR CAMPI RN:[id number] 06/08/1960 60 y.o. 09/27/2020   Principle Diagnosis:  Metastatic prostate cancer - androgen resistant    Past Therapy: 14 fractions of radiation to the lumbar spine Casodex 50 mg by mouth daily - stopped 04/02/2017 due to progression  Zytiga 1000 mg by mouth daily/prednisone 10 mg by mouth daily - started on 04/20/2016 - d/c on 04/02/2017 Xtandi 160 mg po q day - started on 04/06/2017 -- d/c on 06/24/2017 Flutamide 250 mg po q8hr - started 07/08/2017 -- d/c on 04/23/18   Current Therapy:        Lupron 11.5 mg IM every 3 month - next dose 09/2020  Zometa 4 mg IV q 3 months - next dose 09/2020 Apalutamide 240 mg po q day -- started on 04/28/2018 Taxotere --  s/p cycle #1 --  start on 09/05/2020   Interim History:  Mr. Mcmurdie is here today for his second cycle of Taxotere.  He did end up in the emergency room yesterday.  His was having some abdominal pain.  This was in the upper abdomen.  He had no diarrhea or constipation.  He had no vomiting.  There is no fever.  I am not sure exactly what this was all about.  He had a CT of the abdomen and pelvis which was unremarkable.  He had a normal gallbladder.  His liver looked okay.  There is no evidence of pancreatitis.  He had been on Pepcid.  I am not sure why he was not taking this.  I will give a dose of IV Pepcid.    I think he actually tolerated the first cycle fairly well.  It will be interesting to see what the PSA is.  His PSA before treatment was 74.  He has had no mouth sores.  He has had no headache.  His chronic back issues.  We did adjust his medications today.  We went through his medication list and I took him off medicines that I did not think he needed.  He has had no leg swelling.  He has had no rashes.  Overall, I would say performance status probably ECOG 1.  .    Medications:  Allergies as of 09/27/2020       Reactions   Contrast Media [iodinated  Diagnostic Agents] Nausea And Vomiting   Patient vomits immediately after IV contrast is injected just prior to start of scan, was unable to scan patient in correct time frame.  Patient did not warn me of this when dosing the patient with oral CM and asking the patient history questions until the patient was on the table for the CT scan just prior to injection.  Ct Tech:  Karl Bales 07/29/17    Other Nausea And Vomiting   Patient vomits immediately after IV contrast is injected just prior to start of scan, was unable to scan patient in correct time frame.  Patient did not warn me of this when dosing the patient with oral CM and asking the patient history questions until the patient was on the table for the CT scan just prior to injection.  Ct Tech:  Karl Bales 07/29/17         Medication List        Accurate as of September 27, 2020  1:37 PM. If you have any questions, ask your nurse or doctor.          STOP taking these medications  atorvastatin 10 MG tablet Commonly known as: LIPITOR Stopped by: [name], MD   CALCIUM-VITAMIN D3 PO Stopped by: [name], MD   dexamethasone 4 MG tablet Commonly known as: DECADRON Stopped by: [name], MD   dicyclomine 20 MG tablet Commonly known as: BENTYL Stopped by: [name], MD   diltiazem 240 MG 24 hr capsule Commonly known as: CARDIZEM CD Stopped by: [name], MD   LORazepam 0.5 MG tablet Commonly known as: Ativan Stopped by: [name], MD   ondansetron 4 MG disintegrating tablet Commonly known as: Zofran ODT Stopped by: [name], MD   tamsulosin 0.4 MG Caps capsule Commonly known as: FLOMAX Stopped by: [name], MD       TAKE these medications    aspirin 81 MG chewable tablet Chew 1 tablet by mouth daily.   famotidine 40 MG tablet Commonly known as: PEPCID Take 1 tablet (40 mg total) by mouth 2 (two) times daily. What changed:  medication strength how  much to take Changed by: [name], MD   fentaNYL 100 MCG/HR Commonly known as: [place] 2 patches onto the skin every 3 (three) days.   gabapentin 800 MG tablet Commonly known as: NEURONTIN Take 800 mg by mouth 3 (three) times daily. Took 1/2 tablet, as needed   lidocaine-prilocaine cream Commonly known as: EMLA Apply to affected area once   metoprolol tartrate 25 MG tablet Commonly known as: LOPRESSOR TAKE 1/2 TABLET(12.5 MG) BY MOUTH TWICE DAILY   Naloxone HCl 0.4 MG/0.4ML Soaj Use in the event of accidental overdose of opioids.   ondansetron 8 MG tablet Commonly known as: Zofran Take 1 tablet (8 mg total) by mouth 2 (two) times daily as needed for refractory nausea / vomiting. What changed: Another medication with the same name was removed. Continue taking this medication, and follow the directions you see here. Changed by: [name], MD   Oxycodone HCl 20 MG Tabs Take 1 tablet (20 mg total) by mouth every 4 (four) hours as needed for severe pain.   predniSONE 5 MG tablet Commonly known as: DELTASONE Take 1 tablet (5 mg total) by mouth in the morning and at bedtime.   prochlorperazine 10 MG tablet Commonly known as: COMPAZINE Take 1 tablet (10 mg total) by mouth every 6 (six) hours as needed (Nausea or vomiting).        Allergies:  Allergies  Allergen Reactions   Contrast Media [Iodinated Diagnostic Agents] Nausea And Vomiting    Patient vomits immediately after IV contrast is injected just prior to start of scan, was unable to scan patient in correct time frame.  Patient did not warn me of this when dosing the patient with oral CM and asking the patient history questions until the patient was on the table for the CT scan just prior to injection.  Ct Tech:  Karl Bales 07/29/17    Other Nausea And Vomiting    Patient vomits immediately after IV contrast is injected just prior to start of scan, was unable to scan patient in correct time frame.   Patient did not warn me of this when dosing the patient with oral CM and asking the patient history questions until the patient was on the table for the CT scan just prior to injection.  Ct Tech:  Karl Bales 07/29/17     Past Medical History, Surgical history, Social history, and Family History were reviewed and updated.  Review of  Systems: Review of Systems  Constitutional: Negative.   HENT: Negative.    Eyes: Negative.   Respiratory: Negative.    Cardiovascular: Negative.   Gastrointestinal: Negative.   Genitourinary: Negative.   Musculoskeletal:  Positive for back pain, joint pain and neck pain.  Skin: Negative.   Neurological: Negative.   Endo/Heme/Allergies: Negative.   Psychiatric/Behavioral: Negative.      Physical Exam:  weight is 153 lb 1.9 oz (69.5 kg). His oral temperature is 98.6 F (37 C). His blood pressure is 118/80 and his pulse is 74. His respiration is 18 and oxygen saturation is 100%.   Wt Readings from Last 3 Encounters:  09/27/20 153 lb 1.9 oz (69.5 kg)  08/29/20 152 lb 3.2 oz (69 kg)  08/27/20 159 lb (72.1 kg)    Physical Exam Vitals reviewed.  HENT:     Head: Normocephalic and atraumatic.  Eyes:     Pupils: Pupils are equal, round, and reactive to light.  Cardiovascular:     Rate and Rhythm: Normal rate and regular rhythm.     Heart sounds: Normal heart sounds.  Pulmonary:     Effort: Pulmonary effort is normal.     Breath sounds: Normal breath sounds.  Abdominal:     General: Bowel sounds are normal.     Palpations: Abdomen is soft.  Musculoskeletal:        General: No tenderness or deformity. Normal range of motion.     Cervical back: Normal range of motion.  Lymphadenopathy:     Cervical: No cervical adenopathy.  Skin:    General: Skin is warm and dry.     Findings: No erythema or rash.  Neurological:     Mental Status: He is alert and oriented to person, place, and time.  Psychiatric:        Behavior: Behavior normal.         Thought Content: Thought content normal.        Judgment: Judgment normal.    Lab Results  Component Value Date   WBC 14.3 (H) 09/27/2020   HGB 13.0 09/27/2020   HCT 39.7 09/27/2020   MCV 91.9 09/27/2020   PLT 157 09/27/2020   Lab Results  Component Value Date   FERRITIN 216 11/04/2017   IRON 77 11/04/2017   TIBC 261 11/04/2017   UIBC 184 11/04/2017   IRONPCTSAT 30 (L) 11/04/2017   Lab Results  Component Value Date   RBC 4.32 09/27/2020   Lab Results  Component Value Date   KAPLAMBRATIO 0.89 03/28/2016   Lab Results  Component Value Date   IGGSERUM 1,066 03/28/2016   IGMSERUM 106 03/28/2016   No results found for: Odetta Pink, SPEI   Chemistry      Component Value Date/Time   NA 136 09/27/2020 1130   NA 143 12/16/2016 1022   K 3.8 09/27/2020 1130   K 3.5 12/16/2016 1022   CL 104 09/27/2020 1130   CL 101 12/16/2016 1022   CO2 24 09/27/2020 1130   CO2 33 12/16/2016 1022   BUN 10 09/27/2020 1130   BUN 4 (L) 12/16/2016 1022   CREATININE 0.70 09/27/2020 1130   CREATININE 0.76 09/06/2020 1053   CREATININE 0.8 12/16/2016 1022      Component Value Date/Time   CALCIUM 7.9 (L) 09/27/2020 1130   CALCIUM 9.4 12/16/2016 1022   ALKPHOS 562 (H) 09/27/2020 1130   ALKPHOS 77 12/16/2016 1022   AST 18 09/27/2020 1130   AST  13 (L) 09/06/2020 1053   ALT 11 09/27/2020 1130   ALT 14 09/06/2020 1053   ALT 16 12/16/2016 1022   BILITOT 0.6 09/27/2020 1130   BILITOT 0.4 09/06/2020 1053       Impression and Plan: Mr. Marso is a very pleasant 60 yo African American gentleman with metastatic prostate cancer.   Hopefully, will see his PSA coming down.  I would like to think that this would happen since he has never had chemotherapy before.  Hopefully, the Pepcid will help with his abdominal discomfort.  His brother and sister were in visiting.  We talked.  I answered their questions.  We will give his second cycle of  treatment today.  I will plan to have him come back in 3 more weeks for his third cycle, depending on what the PSA shows.    [name], MD 9/1/20221:37 PM

## 2020-09-27 NOTE — Patient Instructions (Signed)
[facility]  Discharge Instructions: Thank you for choosing [facility] to provide your oncology and hematology care.   If you have a lab appointment with the [facility], please go directly to the [facility] and check in at the registration area.  Wear comfortable clothing and clothing appropriate for easy access to any Portacath or PICC line.   We strive to give you quality time with your provider. You may need to reschedule your appointment if you arrive late (15 or more minutes).  Arriving late affects you and other patients whose appointments are after yours.  Also, if you miss three or more appointments without notifying the office, you may be dismissed from the clinic at the provider's discretion.      For prescription refill requests, have your pharmacy contact our office and allow 72 hours for refills to be completed.    Today you received the following chemotherapy and/or immunotherapy agents Taxotere      To help prevent nausea and vomiting after your treatment, we encourage you to take your nausea medication as directed.  BELOW ARE SYMPTOMS THAT SHOULD BE REPORTED IMMEDIATELY: *FEVER GREATER THAN 100.4 F (38 C) OR HIGHER *CHILLS OR SWEATING *NAUSEA AND VOMITING THAT IS NOT CONTROLLED WITH YOUR NAUSEA MEDICATION *UNUSUAL SHORTNESS OF BREATH *UNUSUAL BRUISING OR BLEEDING *URINARY PROBLEMS (pain or burning when urinating, or frequent urination) *BOWEL PROBLEMS (unusual diarrhea, constipation, pain near the anus) TENDERNESS IN MOUTH AND THROAT WITH OR WITHOUT PRESENCE OF ULCERS (sore throat, sores in mouth, or a toothache) UNUSUAL RASH, SWELLING OR PAIN  UNUSUAL VAGINAL DISCHARGE OR ITCHING   Items with * indicate a potential emergency and should be followed up as soon as possible or go to the Emergency Department if any problems should occur.  Please show the CHEMOTHERAPY ALERT CARD or IMMUNOTHERAPY ALERT CARD at check-in to the  Emergency Department and triage nurse. Should you have questions after your visit or need to cancel or reschedule your appointment, please contact [facility]  [phone] and follow the prompts.  Office hours are 8:00 a.m. to 4:30 p.m. Monday - Friday. Please note that voicemails left after 4:00 p.m. may not be returned until the following business day.  We are closed weekends and major holidays. You have access to a nurse at all times for urgent questions. Please call the main number to the clinic [phone] and follow the prompts.  For any non-urgent questions, you may also contact your provider using MyChart. We now offer e-Visits for anyone 46 and older to request care online for non-urgent symptoms. For details visit [email or web address].   Also download the MyChart app! Go to the app store, search "MyChart", open the app, select [facility], and log in with your MyChart username and password.  Due to Covid, a mask is required upon entering the hospital/clinic. If you do not have a mask, one will be given to you upon arrival. For doctor visits, patients may have 1 support person aged 60 or older with them. For treatment visits, patients cannot have anyone with them due to current Covid guidelines and our immunocompromised population.   Docetaxel injection What is this medication? DOCETAXEL (doe se TAX el) is a chemotherapy drug. It targets fast dividing cells, like cancer cells, and causes these cells to die. This medicine is used to treat many types of cancers like breast cancer, certain stomach cancers,head and neck cancer, lung cancer, and prostate cancer.  This medicine may be used for other purposes; ask your health care provider orpharmacist if you have questions. COMMON BRAND NAME(S): Docefrez, Taxotere What should I tell my care team before I take this medication? They need to know if you have any of these conditions: infection (especially a virus  infection such as chickenpox, cold sores, or herpes) liver disease low blood counts, like low white cell, platelet, or red cell counts an unusual or allergic reaction to docetaxel, polysorbate 80, other chemotherapy agents, other medicines, foods, dyes, or preservatives pregnant or trying to get pregnant breast-feeding How should I use this medication? This drug is given as an infusion into a vein. It is administered in a hospitalor clinic by a specially trained health care professional. Talk to your pediatrician regarding the use of this medicine in children.Special care may be needed. Overdosage: If you think you have taken too much of this medicine contact apoison control center or emergency room at once. NOTE: This medicine is only for you. Do not share this medicine with others. What if I miss a dose? It is important not to miss your dose. Call your doctor or health careprofessional if you are unable to keep an appointment. What may interact with this medication? Do not take this medicine with any of the following medications: live virus vaccines This medicine may also interact with the following medications: aprepitant certain antibiotics like erythromycin or clarithromycin certain antivirals for HIV or hepatitis certain medicines for fungal infections like fluconazole, itraconazole, ketoconazole, posaconazole, or voriconazole cimetidine ciprofloxacin conivaptan cyclosporine dronedarone fluvoxamine grapefruit juice imatinib verapamil This list may not describe all possible interactions. Give your health care provider a list of all the medicines, herbs, non-prescription drugs, or dietary supplements you use. Also tell them if you smoke, drink alcohol, or use illegaldrugs. Some items may interact with your medicine. What should I watch for while using this medication? Your condition will be monitored carefully while you are receiving this medicine. You will need important blood  work done while you are taking thismedicine. Call your doctor or health care professional for advice if you get a fever, chills or sore throat, or other symptoms of a cold or flu. Do not treat yourself. This drug decreases your body's ability to fight infections. Try toavoid being around people who are sick. Some products may contain alcohol. Ask your health care professional if this medicine contains alcohol. Be sure to tell all health care professionals you are taking this medicine. Certain medicines, like metronidazole and disulfiram, can cause an unpleasant reaction when taken with alcohol. The reaction includes flushing, headache, nausea, vomiting, sweating, and increased thirst. Thereaction can last from 30 minutes to several hours. You may get drowsy or dizzy. Do not drive, use machinery, or do anything that needs mental alertness until you know how this medicine affects you. Do not stand or sit up quickly, especially if you are an older patient. This reduces the risk of dizzy or fainting spells. Alcohol may interfere with the effect ofthis medicine. Talk to your health care professional about your risk of cancer. You may bemore at risk for certain types of cancer if you take this medicine. Do not become pregnant while taking this medicine or for 6 months after stopping it. Women should inform their doctor if they wish to become pregnant or think they might be pregnant. There is a potential for serious side effects to an unborn child. Talk to your health care professional or pharmacist for more information. Do not  breast-feed an infant while taking this medicine orfor 1 week after stopping it. Males who get this medicine must use a condom during sex with females who can get pregnant. If you get a woman pregnant, the baby could have birth defects. The baby could die before they are born. You will need to continue wearing a condom for 3 months after stopping the medicine. Tell your health care  providerright away if your partner becomes pregnant while you are taking this medicine. This may interfere with the ability to father a child. You should talk to yourdoctor or health care professional if you are concerned about your fertility. What side effects may I notice from receiving this medication? Side effects that you should report to your doctor or health care professionalas soon as possible: allergic reactions like skin rash, itching or hives, swelling of the face, lips, or tongue blurred vision breathing problems changes in vision low blood counts - This drug may decrease the number of white blood cells, red blood cells and platelets. You may be at increased risk for infections and bleeding. nausea and vomiting pain, redness or irritation at site where injected pain, tingling, numbness in the hands or feet redness, blistering, peeling, or loosening of the skin, including inside the mouth signs of decreased platelets or bleeding - bruising, pinpoint red spots on the skin, black, tarry stools, nosebleeds signs of decreased red blood cells - unusually weak or tired, fainting spells, lightheadedness signs of infection - fever or chills, cough, sore throat, pain or difficulty passing urine swelling of the ankle, feet, hands Side effects that usually do not require medical attention (report to yourdoctor or health care professional if they continue or are bothersome): constipation diarrhea fingernail or toenail changes hair loss loss of appetite mouth sores muscle pain This list may not describe all possible side effects. Call your doctor for medical advice about side effects. You may report side effects to FDA at1-800-FDA-1088. Where should I keep my medication? This drug is given in a hospital or clinic and will not be stored at home. NOTE: This sheet is a summary. It may not cover all possible information. If you have questions about this medicine, talk to your doctor,  pharmacist, orhealth care provider.  2022 Elsevier/Gold Standard (2018-12-13 19:50:31)

## 2020-09-27 NOTE — Telephone Encounter (Signed)
Appts made per 09/27/20 los, pt to gain updated sch at [facility] and through avs/tx   Ryan Davis

## 2020-09-28 ENCOUNTER — Telehealth: Payer: Self-pay | Admitting: *Deleted

## 2020-09-28 ENCOUNTER — Ambulatory Visit: Payer: Medicare Other

## 2020-09-28 ENCOUNTER — Encounter: Payer: Self-pay | Admitting: General Practice

## 2020-09-28 LAB — PSA, TOTAL AND FREE
PSA, Free Pct: 21.8 %
PSA, Free: 15.4 ng/mL
Prostate Specific Ag, Serum: 70.8 ng/mL — ABNORMAL HIGH (ref 0.0–4.0)

## 2020-09-28 NOTE — Telephone Encounter (Signed)
-----   Message from [name], MD sent at 09/28/2020  8:56 AM EDT ----- Please call and tell him that the PSA went down to 70.  This actually is quite good.  Laurey Arrow

## 2020-09-28 NOTE — Telephone Encounter (Signed)
Notified pt of results. Pt verbalized understanding.

## 2020-09-28 NOTE — Progress Notes (Addendum)
Ryan Davis CSW Progress Notes  Call from Sherilyn Banker, sister of patient [phone]).  She wants paperwork completed for in home aide.  She has obtained CAP paperwork and requests this be completed by CSW [place].  Undersigned CSW called sister back, clarified that sister has given paperwork to PCP office and is waiting for them to complete it.  CSW also received call from [place], [place] representative from [employer].  Per Georgina Snell, there is a 5 month wait list for CAP services and patient is not considered a priority case so this will not be expedited.  He can apply for [facility] if oncologist is willing to complete paperwork.  CSW will ask [facility] treatment team to consider this request.  Per sister, "he will need help in the morning getting up and out of bed, get his breakfast and then will need help with his dinner and getting into bed at night."  They are concerned that chemotherapy will weaken patient such that he will need this assistance.  His wife does receive some kind of in home help from an aide, they would like patient to receive the same type of assistance.  CSW can provide appropriate paperwork to [facility] staff for oncologist consideration.  Call from patient, he would like to be referred to [facility] for evaluation for [place], paperwork sent to [facility] for completion.    Edwyna Shell, LCSW Clinical Social Worker Phone:  [phone]

## 2020-10-16 ENCOUNTER — Telehealth: Payer: Self-pay

## 2020-10-16 NOTE — Telephone Encounter (Signed)
Pt called in and is wanting to know if he is due for his Oxycodone refill and would like to know if he can switch back to the Percocet 10-325 mg due to stomach issues. Pt states he is taking 2-3 tablets a day. Last refill was 08/31/20 #120 0rfs.  Per the TE from 09/04/20 pt called inquiring about the same thing and was told to try cutting the Oxycodone 20 mg in half as it is the same dose as previously prescribed. Asked pt if he had been cutting the Oxy in half and he declined and stated he would try the half dose and see if it helps his stomach issues.

## 2020-10-18 ENCOUNTER — Other Ambulatory Visit: Payer: Self-pay

## 2020-10-18 ENCOUNTER — Emergency Department (HOSPITAL_BASED_OUTPATIENT_CLINIC_OR_DEPARTMENT_OTHER): Payer: Medicare Other

## 2020-10-18 ENCOUNTER — Inpatient Hospital Stay (HOSPITAL_BASED_OUTPATIENT_CLINIC_OR_DEPARTMENT_OTHER)
Admission: EM | Admit: 2020-10-18 | Discharge: 2020-10-21 | DRG: 948 | Disposition: A | Discharge status: Home Health | Payer: Medicare Other | Attending: Internal Medicine | Admitting: Internal Medicine

## 2020-10-18 ENCOUNTER — Encounter (HOSPITAL_BASED_OUTPATIENT_CLINIC_OR_DEPARTMENT_OTHER): Payer: Self-pay | Admitting: Emergency Medicine

## 2020-10-18 DIAGNOSIS — Z87891 Personal history of nicotine dependence: Secondary | ICD-10-CM

## 2020-10-18 DIAGNOSIS — T402X5A Adverse effect of other opioids, initial encounter: Secondary | ICD-10-CM | POA: Diagnosis not present

## 2020-10-18 DIAGNOSIS — Z7952 Long term (current) use of systemic steroids: Secondary | ICD-10-CM | POA: Diagnosis not present

## 2020-10-18 DIAGNOSIS — Z681 Body mass index (BMI) 19 or less, adult: Secondary | ICD-10-CM

## 2020-10-18 DIAGNOSIS — C61 Malignant neoplasm of prostate: Secondary | ICD-10-CM | POA: Diagnosis present

## 2020-10-18 DIAGNOSIS — I480 Paroxysmal atrial fibrillation: Secondary | ICD-10-CM | POA: Diagnosis not present

## 2020-10-18 DIAGNOSIS — M549 Dorsalgia, unspecified: Secondary | ICD-10-CM | POA: Diagnosis not present

## 2020-10-18 DIAGNOSIS — R3911 Hesitancy of micturition: Secondary | ICD-10-CM | POA: Diagnosis present

## 2020-10-18 DIAGNOSIS — G893 Neoplasm related pain (acute) (chronic): Secondary | ICD-10-CM | POA: Diagnosis not present

## 2020-10-18 DIAGNOSIS — R1084 Generalized abdominal pain: Secondary | ICD-10-CM | POA: Diagnosis present

## 2020-10-18 DIAGNOSIS — K5903 Drug induced constipation: Secondary | ICD-10-CM | POA: Diagnosis present

## 2020-10-18 DIAGNOSIS — D72829 Elevated white blood cell count, unspecified: Secondary | ICD-10-CM | POA: Diagnosis not present

## 2020-10-18 DIAGNOSIS — E440 Moderate protein-calorie malnutrition: Secondary | ICD-10-CM | POA: Diagnosis present

## 2020-10-18 DIAGNOSIS — Z7982 Long term (current) use of aspirin: Secondary | ICD-10-CM | POA: Diagnosis not present

## 2020-10-18 DIAGNOSIS — Z923 Personal history of irradiation: Secondary | ICD-10-CM | POA: Diagnosis not present

## 2020-10-18 DIAGNOSIS — Z20822 Contact with and (suspected) exposure to covid-19: Secondary | ICD-10-CM | POA: Diagnosis present

## 2020-10-18 DIAGNOSIS — Z91041 Radiographic dye allergy status: Secondary | ICD-10-CM | POA: Diagnosis not present

## 2020-10-18 DIAGNOSIS — R748 Abnormal levels of other serum enzymes: Secondary | ICD-10-CM | POA: Diagnosis not present

## 2020-10-18 DIAGNOSIS — Z79899 Other long term (current) drug therapy: Secondary | ICD-10-CM | POA: Diagnosis not present

## 2020-10-18 DIAGNOSIS — E876 Hypokalemia: Secondary | ICD-10-CM | POA: Diagnosis present

## 2020-10-18 DIAGNOSIS — C7951 Secondary malignant neoplasm of bone: Secondary | ICD-10-CM | POA: Diagnosis present

## 2020-10-18 DIAGNOSIS — R109 Unspecified abdominal pain: Secondary | ICD-10-CM

## 2020-10-18 DIAGNOSIS — I4891 Unspecified atrial fibrillation: Secondary | ICD-10-CM | POA: Diagnosis present

## 2020-10-18 LAB — CBC WITH DIFFERENTIAL/PLATELET
Abs Immature Granulocytes: 0.6 10*3/uL — ABNORMAL HIGH (ref 0.00–0.07)
Basophils Absolute: 0.1 10*3/uL (ref 0.0–0.1)
Basophils Relative: 0 %
Eosinophils Absolute: 0 10*3/uL (ref 0.0–0.5)
Eosinophils Relative: 0 %
HCT: 40.1 % (ref 39.0–52.0)
Hemoglobin: 13.8 g/dL (ref 13.0–17.0)
Immature Granulocytes: 5 %
Lymphocytes Relative: 16 %
Lymphs Abs: 2.2 10*3/uL (ref 0.7–4.0)
MCH: 30.4 pg (ref 26.0–34.0)
MCHC: 34.4 g/dL (ref 30.0–36.0)
MCV: 88.3 fL (ref 80.0–100.0)
Monocytes Absolute: 1 10*3/uL (ref 0.1–1.0)
Monocytes Relative: 8 %
Neutro Abs: 9.4 10*3/uL — ABNORMAL HIGH (ref 1.7–7.7)
Neutrophils Relative %: 71 %
Platelets: 251 10*3/uL (ref 150–400)
RBC: 4.54 MIL/uL (ref 4.22–5.81)
RDW: 14.6 % (ref 11.5–15.5)
WBC: 13.3 10*3/uL — ABNORMAL HIGH (ref 4.0–10.5)
nRBC: 0 % (ref 0.0–0.2)

## 2020-10-18 LAB — COMPREHENSIVE METABOLIC PANEL
ALT: 13 U/L (ref 0–44)
AST: 15 U/L (ref 15–41)
Albumin: 4.3 g/dL (ref 3.5–5.0)
Alkaline Phosphatase: 278 U/L — ABNORMAL HIGH (ref 38–126)
Anion gap: 11 (ref 5–15)
BUN: 8 mg/dL (ref 6–20)
CO2: 25 mmol/L (ref 22–32)
Calcium: 8.6 mg/dL — ABNORMAL LOW (ref 8.9–10.3)
Chloride: 99 mmol/L (ref 98–111)
Creatinine, Ser: 0.72 mg/dL (ref 0.61–1.24)
GFR, Estimated: >60 mL/min (ref >60)
Glucose, Bld: 119 mg/dL — ABNORMAL HIGH (ref 70–99)
Potassium: 3.3 mmol/L — ABNORMAL LOW (ref 3.5–5.1)
Sodium: 135 mmol/L (ref 135–145)
Total Bilirubin: 0.6 mg/dL (ref 0.3–1.2)
Total Protein: 7.1 g/dL (ref 6.5–8.1)

## 2020-10-18 LAB — RESP PANEL BY RT-PCR (FLU A&B, COVID) ARPGX2
Influenza A by PCR: NEGATIVE
Influenza B by PCR: NEGATIVE
SARS Coronavirus 2 by RT PCR: NEGATIVE

## 2020-10-18 LAB — LIPASE, BLOOD: Lipase: 18 U/L (ref 11–51)

## 2020-10-18 LAB — TROPONIN I (HIGH SENSITIVITY)
Troponin I (High Sensitivity): 4 ng/L (ref <18)
Troponin I (High Sensitivity): 4 ng/L (ref <18)

## 2020-10-18 MED ORDER — PREDNISONE 5 MG PO TABS
5.0000 mg | ORAL_TABLET | Freq: Two times a day (BID) | ORAL | Status: DC
  Filled 2020-10-18 (×4): qty 1

## 2020-10-18 MED ORDER — HYDROMORPHONE HCL 1 MG/ML IJ SOLN
0.5000 mg | INTRAMUSCULAR | Status: DC | PRN
  Administered 2020-10-18: 0.5 mg via INTRAVENOUS
  Filled 2020-10-18: qty 1

## 2020-10-18 MED ORDER — OXYCODONE HCL 5 MG PO TABS
5.0000 mg | ORAL_TABLET | ORAL | Status: DC | PRN
  Administered 2020-10-18 – 2020-10-19 (×2): 5 mg via ORAL
  Filled 2020-10-18 (×2): qty 1

## 2020-10-18 MED ORDER — METOPROLOL TARTRATE 12.5 MG HALF TABLET
12.5000 mg | ORAL_TABLET | Freq: Two times a day (BID) | ORAL | Status: DC
  Administered 2020-10-18 – 2020-10-21 (×5): 12.5 mg via ORAL
  Filled 2020-10-18 (×6): qty 1

## 2020-10-18 MED ORDER — ENOXAPARIN SODIUM 40 MG/0.4ML IJ SOSY
40.0000 mg | PREFILLED_SYRINGE | INTRAMUSCULAR | Status: DC
  Administered 2020-10-18: 40 mg via SUBCUTANEOUS
  Filled 2020-10-18 (×3): qty 0.4

## 2020-10-18 MED ORDER — FLEET ENEMA 7-19 GM/118ML RE ENEM: 1.0000 | ENEMA | Freq: Once | RECTAL | Status: DC | PRN

## 2020-10-18 MED ORDER — PANTOPRAZOLE SODIUM 40 MG IV SOLR
40.0000 mg | Freq: Every day | INTRAVENOUS | Status: DC
  Administered 2020-10-18 – 2020-10-20 (×3): 40 mg via INTRAVENOUS
  Filled 2020-10-18 (×3): qty 40

## 2020-10-18 MED ORDER — DOCUSATE SODIUM 100 MG PO CAPS
100.0000 mg | ORAL_CAPSULE | Freq: Two times a day (BID) | ORAL | Status: DC
  Administered 2020-10-18 – 2020-10-19 (×3): 100 mg via ORAL
  Filled 2020-10-18 (×3): qty 1

## 2020-10-18 MED ORDER — HYDROMORPHONE HCL 1 MG/ML IJ SOLN
2.0000 mg | Freq: Once | INTRAMUSCULAR | Status: AC
  Administered 2020-10-18: 2 mg via INTRAVENOUS
  Filled 2020-10-18: qty 2

## 2020-10-18 MED ORDER — SODIUM CHLORIDE 0.9 % IV BOLUS
500.0000 mL | Freq: Once | INTRAVENOUS | Status: AC
  Administered 2020-10-18: 500 mL via INTRAVENOUS

## 2020-10-18 MED ORDER — POLYETHYLENE GLYCOL 3350 17 G PO PACK
17.0000 g | PACK | Freq: Every day | ORAL | Status: DC
  Administered 2020-10-18: 17 g via ORAL
  Filled 2020-10-18: qty 1

## 2020-10-18 MED ORDER — ASPIRIN 81 MG PO CHEW
81.0000 mg | CHEWABLE_TABLET | Freq: Every day | ORAL | Status: DC
  Administered 2020-10-19 – 2020-10-21 (×3): 81 mg via ORAL
  Filled 2020-10-18 (×3): qty 1

## 2020-10-18 MED ORDER — HYDROMORPHONE HCL 1 MG/ML IJ SOLN
1.0000 mg | INTRAMUSCULAR | Status: DC | PRN
  Administered 2020-10-18 (×2): 1 mg via INTRAVENOUS
  Filled 2020-10-18 (×2): qty 1

## 2020-10-18 MED ORDER — HYDROMORPHONE HCL 1 MG/ML IJ SOLN
1.0000 mg | INTRAMUSCULAR | Status: DC | PRN
  Administered 2020-10-18 – 2020-10-19 (×6): 2 mg via INTRAVENOUS
  Administered 2020-10-19: 1 mg via INTRAVENOUS
  Administered 2020-10-20: 2 mg via INTRAVENOUS
  Filled 2020-10-18 (×7): qty 2
  Filled 2020-10-18: qty 1

## 2020-10-18 MED ORDER — SODIUM CHLORIDE 0.9 % IV BOLUS
1000.0000 mL | Freq: Once | INTRAVENOUS | Status: AC
  Administered 2020-10-18: 1000 mL via INTRAVENOUS

## 2020-10-18 MED ORDER — POLYETHYLENE GLYCOL 3350 17 G PO PACK: 17.0000 g | PACK | Freq: Every day | ORAL | Status: DC | PRN

## 2020-10-18 MED ORDER — FENTANYL 100 MCG/HR TD PT72: 2.0000 | MEDICATED_PATCH | TRANSDERMAL | Status: DC

## 2020-10-18 MED ORDER — GABAPENTIN 400 MG PO CAPS
800.0000 mg | ORAL_CAPSULE | Freq: Three times a day (TID) | ORAL | Status: DC
  Administered 2020-10-18 – 2020-10-21 (×9): 800 mg via ORAL
  Filled 2020-10-18 (×9): qty 2

## 2020-10-18 MED ORDER — ONDANSETRON HCL 4 MG/2ML IJ SOLN
4.0000 mg | Freq: Once | INTRAMUSCULAR | Status: AC
  Administered 2020-10-18: 4 mg via INTRAVENOUS
  Filled 2020-10-18: qty 2

## 2020-10-18 MED ORDER — METOPROLOL TARTRATE 5 MG/5ML IV SOLN: 5.0000 mg | Freq: Four times a day (QID) | INTRAVENOUS | Status: DC | PRN

## 2020-10-18 MED ORDER — BISACODYL 5 MG PO TBEC
5.0000 mg | DELAYED_RELEASE_TABLET | Freq: Every day | ORAL | Status: DC | PRN
  Administered 2020-10-19: 5 mg via ORAL
  Filled 2020-10-18: qty 1

## 2020-10-18 MED ORDER — HYDROMORPHONE HCL 1 MG/ML IJ SOLN
1.0000 mg | INTRAMUSCULAR | Status: AC | PRN
  Administered 2020-10-18: 1 mg via INTRAVENOUS
  Filled 2020-10-18: qty 1

## 2020-10-18 MED ORDER — POTASSIUM CHLORIDE 20 MEQ PO PACK
40.0000 meq | PACK | Freq: Once | ORAL | Status: AC
  Administered 2020-10-18: 40 meq via ORAL
  Filled 2020-10-18 (×2): qty 2

## 2020-10-18 MED ORDER — LACTULOSE 10 GM/15ML PO SOLN
10.0000 g | Freq: Two times a day (BID) | ORAL | Status: DC | PRN
  Administered 2020-10-19: 10 g via ORAL
  Filled 2020-10-18: qty 15

## 2020-10-18 MED ORDER — FAMOTIDINE 20 MG PO TABS
40.0000 mg | ORAL_TABLET | Freq: Two times a day (BID) | ORAL | Status: DC
  Administered 2020-10-18 – 2020-10-21 (×6): 40 mg via ORAL
  Filled 2020-10-18 (×7): qty 2

## 2020-10-18 NOTE — ED Provider Notes (Signed)
Ultrasound ED Peripheral IV (Provider)  Date/Time: 10/18/2020 12:31 PM Performed by: Tedd Sias, PA Authorized by: Tedd Sias, PA   Procedure details:    Indications: multiple failed IV attempts     Skin Prep: chlorhexidine gluconate     Location:  Left AC   Angiocath:  20 G   Bedside Ultrasound Guided: Yes     Images: not archived     Patient tolerated procedure without complications: Yes     Dressing applied: Yes      Tedd Sias, PA 10/18/20 1231    Wyvonnia Dusky, MD 10/18/20 [phone]

## 2020-10-18 NOTE — ED Triage Notes (Signed)
Pt is c/o abd pain and back pain that started yesterday  Pt states he has been vomiting  Pt has prostate cancer and is undergoing chemo  Last treatment was last month and is due for his next one September 26th

## 2020-10-18 NOTE — H&P (Addendum)
History and Physical    DOA: 10/18/2020  PCP: Robert Bellow, PA-C  Patient coming from: Home  Chief Complaint: Abdominal pain  HPI: Ryan Davis is a 60 y.o. 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 with complaints of abdominal pain and chest wall pain associated with vomiting.  Patient does have history of diffuse bone metastasis and being on fentanyl patch/oxycodone at home. Reports trouble urinating last night with hesitance/retention --states walked around the house and was able to void about 1 hr later. Last BM was 3 days ago . Denies any headache, melena, hematochezia.  Denies any chest tightness or palpitations or sweating.  ED work-up: T-max 99.33F, respiratory rate 18, SBP 140-168, O2 sat 100% on room air.  Lab work showed potassium 3.3, normal creatinine at 0.7, chronically elevated alkaline phosphatase at 278, lipase 18.  EKG and troponin x2 unremarkable.  CT CAP without contrast at outside ED showed-diffuse osseous metastatic disease throughout the chest, abdomen and pelvis. No pathologic fracture identified. No obvious extra-osseous tumor extension.Moderate retained stool in the right colon increased since last month.  Patient received IV Dilaudid 1 mg followed by another dose of 2 mg in the ED with no relief.  Patient initiated on IV fluids with additional pain medications and admission requested for pain control.       Review of Systems: As per HPI, otherwise review of systems negative.    Past Medical History:  Diagnosis Date   A-fib [facility])    see 03-28-16 admission   Back pain without radiculopathy 03/28/2016   Dyspnea    endorses since treatment radiation , still gets SOB occasionally    Dysrhythmia    afib    Goals of care, counseling/discussion 06/24/2017   History of radiation therapy 09/16/16-10/06/16   35 Gy in 14 fractions to the lumbar spine   Nocturia     Prostate cancer metastatic to bone ([place]) 03/31/2016   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[place]) 03/31/2016    Past Surgical History:  Procedure Laterality Date   IR IMAGING GUIDED PORT INSERTION  09/05/2020   MULTIPLE EXTRACTIONS WITH ALVEOLOPLASTY N/A 11/19/2016   Procedure: EXTRACTION OF TOOTH #'S 1,3,5-7,9-17, AND 20- 29 WITH ALVEOLOPLASTY, BILATERAL MANDIBULAR TORI REDUCTIONS AND BILATERAL MAXILLARY BUCCAL EXOSTOSES REDUCTIONS;  Surgeon: Lenn Cal, DDS;  Location: [facility] ORS;  Service: Oral Surgery;  Laterality: N/A;   NO PAST SURGERIES      Social history:  reports that he has quit smoking. His smoking use included cigarettes. He has a 19.50 pack-year smoking history. He has never used smokeless tobacco. He reports that he does not currently use alcohol. He reports that he does not use drugs.   Allergies  Allergen Reactions   Contrast Media [Iodinated Diagnostic Agents] Nausea And Vomiting    Patient vomits immediately after IV contrast is injected just prior to start of scan, was unable to scan patient in correct time frame.  Patient did not warn me of this when dosing the patient with oral CM and asking the patient history questions until the patient was on the table for the CT scan just prior to injection.  Ct Tech:  Karl Bales 07/29/17    Other Nausea And Vomiting    Patient vomits immediately after IV contrast is injected just prior to start of scan, was unable to scan patient in correct time frame.  Patient did not warn me of this when  dosing the patient with oral CM and asking the patient history questions until the patient was on the table for the CT scan just prior to injection.  Ct Tech:  Karl Bales 07/29/17     Family History  Problem Relation Age of Onset   Stroke Sister    Lupus Mother    Cancer Neg Hx    Heart disease Neg Hx    Diabetes Neg Hx       Prior to Admission medications   Medication Sig Start Date End Date Taking? Authorizing Provider   aspirin 81 MG chewable tablet Chew 81 mg by mouth daily. 04/09/17   [provider]  famotidine (PEPCID) 40 MG tablet Take 1 tablet (40 mg total) by mouth 2 (two) times daily. 09/27/20   [name], MD  fentaNYL (DURAGESIC) 100 MCG/HR Place 2 patches onto the skin every 3 (three) days. 09/27/20   [name], MD  gabapentin (NEURONTIN) 800 MG tablet Take 800 mg by mouth 3 (three) times daily. Took 1/2 tablet, as needed    [provider]  lidocaine-prilocaine (EMLA) cream Apply to affected area once Patient taking differently: Apply 1 application topically as needed. 09/06/20   [name], MD  metoprolol tartrate (LOPRESSOR) 25 MG tablet TAKE 1/2 TABLET(12.5 MG) BY MOUTH TWICE DAILY Patient taking differently: Take 12.5 mg by mouth 2 (two) times daily. 09/24/20   [name], MD  Naloxone HCl 0.4 MG/0.4ML SOAJ Use in the event of accidental overdose of opioids. 08/26/20   Arnaldo Natal, MD  ondansetron (ZOFRAN) 8 MG tablet Take 1 tablet (8 mg total) by mouth 2 (two) times daily as needed for refractory nausea / vomiting. 09/06/20   [name], MD  oxyCODONE 20 MG TABS Take 1 tablet (20 mg total) by mouth every 4 (four) hours as needed for severe pain. 08/31/20   [name], MD  predniSONE (DELTASONE) 5 MG tablet Take 1 tablet (5 mg total) by mouth in the morning and at bedtime. 09/06/20   [name], MD  prochlorperazine (COMPAZINE) 10 MG tablet Take 1 tablet (10 mg total) by mouth every 6 (six) hours as needed (Nausea or vomiting). 09/06/20   [name], MD    Physical Exam: Vitals:   10/18/20 1100 10/18/20 1115 10/18/20 1130 10/18/20 1337  BP: (!) 149/91  (!) 148/84 (!) 166/94  Pulse:  66 66 (!) 59  Resp:  18 18 18   Temp:    99.1 F (37.3 C)  TempSrc:    Oral  SpO2:  97% 98% 100%  Weight:      Height:        Constitutional: Patient somewhat uncomfortable due to pain , no acute distress Eyes: PERRL, lids and conjunctivae  normal ENMT: Mucous membranes are moist. Posterior pharynx clear of any exudate or lesions.Normal dentition.  Neck: normal, supple, no masses, no thyromegaly Respiratory: clear to auscultation bilaterally, no wheezing, no crackles. Normal respiratory effort. No accessory muscle use.  Cardiovascular: Reproducible chest wall/rib pain bilaterally .Regular rate and rhythm, no murmurs / rubs / gallops. No extremity edema. 2+ pedal pulses. No carotid bruits.  Abdomen: Diffuse abdominal tenderness more in epigastrium, no rebound/guarding, no masses palpated. No hepatosplenomegaly. Bowel sounds positive.  Musculoskeletal: no clubbing / cyanosis. No joint deformity upper and lower extremities. Good ROM, no contractures. Normal muscle tone.  Neurologic: CN 2-12 grossly intact. Sensation intact, DTR normal. Strength 5/5 in all 4.  Psychiatric: Normal judgment and  insight. Alert and oriented x 3. Normal mood.  SKIN/catheters: no rashes, lesions, ulcers. No induration  Labs on Admission: I have personally reviewed following labs and imaging studies  CBC: Recent Labs  Lab 10/18/20 0649  WBC 13.3*  NEUTROABS 9.4*  HGB 13.8  HCT 40.1  MCV 88.3  PLT 440   Basic Metabolic Panel: Recent Labs  Lab 10/18/20 0649  NA 135  K 3.3*  CL 99  CO2 25  GLUCOSE 119*  BUN 8  CREATININE 0.72  CALCIUM 8.6*   GFR: Estimated Creatinine Clearance: 97.6 mL/min (by C-G formula based on SCr of 0.72 mg/dL). Recent Labs  Lab 10/18/20 0649  WBC 13.3*   Liver Function Tests: Recent Labs  Lab 10/18/20 0649  AST 15  ALT 13  ALKPHOS 278*  BILITOT 0.6  PROT 7.1  ALBUMIN 4.3   Recent Labs  Lab 10/18/20 0649  LIPASE 18   No results for input(s): AMMONIA in the last 168 hours. Coagulation Profile: No results for input(s): INR, PROTIME in the last 168 hours. Cardiac Enzymes: No results for input(s): CKTOTAL, CKMB, CKMBINDEX, TROPONINI in the last 168 hours. BNP (last 3 results) No results for input(s):  PROBNP in the last 8760 hours. HbA1C: No results for input(s): HGBA1C in the last 72 hours. CBG: No results for input(s): GLUCAP in the last 168 hours. Lipid Profile: No results for input(s): CHOL, HDL, LDLCALC, TRIG, CHOLHDL, LDLDIRECT in the last 72 hours. Thyroid Function Tests: No results for input(s): TSH, T4TOTAL, FREET4, T3FREE, THYROIDAB in the last 72 hours. Anemia Panel: No results for input(s): VITAMINB12, FOLATE, FERRITIN, TIBC, IRON, RETICCTPCT in the last 72 hours. Urine analysis:    Component Value Date/Time   COLORURINE YELLOW 08/26/2020 1348   APPEARANCEUR CLEAR 08/26/2020 1348   LABSPEC 1.015 08/26/2020 1348   PHURINE 6.5 08/26/2020 1348   GLUCOSEU NEGATIVE 08/26/2020 1348   HGBUR LARGE (A) 08/26/2020 1348   BILIRUBINUR NEGATIVE 08/26/2020 1348   KETONESUR NEGATIVE 08/26/2020 1348   PROTEINUR NEGATIVE 08/26/2020 1348   NITRITE NEGATIVE 08/26/2020 1348   LEUKOCYTESUR TRACE (A) 08/26/2020 1348    Radiological Exams on Admission: Personally reviewed  CT CHEST ABDOMEN PELVIS WO CONTRAST  Result Date: 10/18/2020 CLINICAL DATA:  60 year old male with metastatic prostate cancer. Undergoing chemotherapy. Acute abdominal and back pain. Vomiting. EXAM: CT CHEST, ABDOMEN AND PELVIS WITHOUT CONTRAST TECHNIQUE: Multidetector CT imaging of the chest, abdomen and pelvis was performed following the standard protocol without IV contrast. COMPARISON:  [facility] CT Abdomen and Pelvis 09/26/2020. [place] 08/26/2020, PET-CT 08/14/2020 and earlier. FINDINGS: CT CHEST FINDINGS Cardiovascular: Right chest Port-A-Cath now in place. No cardiomegaly or pericardial effusion. Vascular patency is not evaluated in the absence of IV contrast. Mediastinum/Nodes: No mediastinal mass or lymphadenopathy is evident in the absence of IV contrast. Lungs/Pleura: Major airways are patent. Mild linear lower lobe scarring is stable, with less associated atelectasis  since July. Stable similar mild apical scarring. No pulmonary metastatic disease or pleural effusion. Musculoskeletal: Diffuse osseous metastatic disease, predominantly sclerotic lesions throughout the thorax including the bilateral ribs, sternum and scapulae. No acute pathologic fracture is identified. No noncontrast CT evidence of thoracic epidural tumor. CT ABDOMEN PELVIS FINDINGS Hepatobiliary: Stable and negative noncontrast liver and gallbladder. Pancreas: Roughly 3 cm cystic mass at the tail of the pancreas remains stable. And in July was compared back to 2018 and stable. Spleen: Stable, negative. Adrenals/Urinary Tract: Stable, negative. Stomach/Bowel: No dilated large or small bowel.  Redundant sigmoid colon in the pelvis. Moderate retained stool has increased in the right colon since last month. The cecum is partially located in the pelvis. Appendix not identified. No pericecal inflammation. No dilated small bowel. Decompressed stomach and duodenum. No free air, free fluid or mesenteric inflammation identified. Vascular/Lymphatic: Normal caliber abdominal aorta. Minimal calcified atherosclerosis. Vascular patency is not evaluated in the absence of IV contrast. No lymphadenopathy identified. Reproductive: Stable diminutive prostate. Other: No pelvic free fluid. Musculoskeletal: Diffuse osseous metastatic disease throughout the spine, pelvis and LEs affecting the proximal femurs. No pathologic fracture identified. No noncontrast CT evidence of lumbar epidural tumor. IMPRESSION: 1. Diffuse osseous metastatic disease throughout the chest, abdomen, and pelvis. No pathologic fracture identified. No obvious extra-osseous tumor extension. 2. No other acute or metastatic process identified on this noncontrast exam. Moderate retained stool in the right colon has increased since last month. Electronically Signed   By: Genevie Ann M.D.   On: 10/18/2020 07:09    EKG: Independently reviewed. NSR Qtc  470ms     Assessment and Plan:   Principal Problem:   Abdominal pain Active Problems:   Back pain without radiculopathy   Atrial fibrillation (HCC)   Prostate cancer metastatic to multiple sites [facility])   Prostate cancer metastatic to bone ([place])    1.Abdominal/chest and back pain: due to bony metastasis likely. EKG/trop unremarkable and pain reproducible. Admit with IV dilaudid prn, watch for sedation. Resume fentanyl patch . Treat constipation and r/o urinary retention. Bladder scan ordered. Resume Pepcid that he takes at home and IV PPI given epigastric tenderness/prednisone use. Wants to try CLD , still nauseous. Prn meds, advance diet as tolerated. IVF can be stopped if tolerating diet.  2.  Metastatic prostrate cancer: undergoing chemo therapy and follows Dr Marin Olp. Had radiation Rx previously for spine mets. Been on prednisone x 1 week per Dr Marin Olp to help with pain per patient. Consulted palliative care to assist with long term goals and symptom management  3. A fib , paroxysmal: Recollects isolated episode, no recurrence. Takes metoprolol/asa  4. Constipation: Last BM 3 days back and stool burden on CT in the setting of long term opiate use. Will try scheduled colace/miralax with prn lactulose/enema. If unsuccesful, can consider Relistor.  5. Urinary hesitancy: in the setting of prostrate ca h/o. Check bladder scan and treat accordingly.   6. Hypokalemia: replace. Labs in am  7. Leucocytosis: likely reactive from nausea/vomiting or from chronic prednisone. Repeat in am    DVT prophylaxis: Lovenox   COVID screen: negative  Code Status:  Full code  .Health care proxy would be his brother, [phone]. Lives with wife but apparently she has health issues and would like brother to be primary contact.  Patient/Family Communication: Discussed with patient and all questions answered to satisfaction.  Consults called: Palliative care Admission status :Patient will be  admitted under OBSERVATION status.The patient's presenting symptoms, physical exam findings, and initial radiographic and laboratory data in the context of their medical condition is felt to place them at low risk for further clinical deterioration. Furthermore, it is anticipated that the patient will be medically stable for discharge from the hospital within 2 midnights of hospital stay.       Guilford Shi MD [facility] Pager in [place]  If 7PM-7AM, please contact night-coverage [email or web address]   10/18/2020, 4:46 PM

## 2020-10-18 NOTE — ED Provider Notes (Signed)
Emergency Department Provider Note   I have reviewed the triage vital signs and the nursing notes.   HISTORY  Chief Complaint Abdominal Pain   HPI Ryan Davis is a 60 y.o. male with PMH reviewed below presents to the ED with metastatic prostate cancer presents to the ED with severe abdominal pain along with nausea/vomiting. Symptoms are similar to prior presentations but more severe. Pain is diffuse throughout the abdomen with some radiation to the lower chest. No SOB. No fever. Has been trying his home medications for cancer related pain without relief. Denies numbness/tingling. No dysuria or urgency but does describe some hesitancy.    Past Medical History:  Diagnosis Date   A-fib [facility])    see 03-28-16 admission   Back pain without radiculopathy 03/28/2016   Dyspnea    endorses since treatment radiation , still gets SOB occasionally    Dysrhythmia    afib    Goals of care, counseling/discussion 06/24/2017   History of radiation therapy 09/16/16-10/06/16   35 Gy in 14 fractions to the lumbar spine   Nocturia    Prostate cancer metastatic to bone ([place]) 03/31/2016   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[place]) 03/31/2016    Patient Active Problem List   Diagnosis Date Noted   Goals of care, counseling/discussion 06/24/2017   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[place]) 03/31/2016   Prostate cancer metastatic to bone ([place]) 03/31/2016   Atrial fibrillation ([place]) 03/30/2016   Back pain without radiculopathy 03/28/2016   Atrial fibrillation with RVR ([place]) 03/28/2016    Past Surgical History:  Procedure Laterality Date   IR IMAGING GUIDED PORT INSERTION  09/05/2020   MULTIPLE EXTRACTIONS WITH ALVEOLOPLASTY N/A 11/19/2016   Procedure: EXTRACTION OF TOOTH #'S 1,3,5-7,9-17, AND 20- 29 WITH ALVEOLOPLASTY, BILATERAL MANDIBULAR TORI REDUCTIONS AND BILATERAL MAXILLARY BUCCAL EXOSTOSES REDUCTIONS;  Surgeon: Lenn Cal, DDS;  Location: [facility];  Service: Oral Surgery;   Laterality: N/A;   NO PAST SURGERIES      Allergies Contrast media [iodinated diagnostic agents] and Other  Family History  Problem Relation Age of Onset   Stroke Sister    Lupus Mother    Cancer Neg Hx    Heart disease Neg Hx    Diabetes Neg Hx     Social History Social History   Tobacco Use   Smoking status: Former    Packs/day: 0.50    Years: 39.00    Pack years: 19.50    Types: Cigarettes   Smokeless tobacco: Never  Vaping Use   Vaping Use: Never used  Substance Use Topics   Alcohol use: Not Currently   Drug use: No    Review of Systems  Constitutional: No fever/chills Eyes: No visual changes. ENT: No sore throat. Cardiovascular: Positive chest pain. Respiratory: Denies shortness of breath. Gastrointestinal: Positive abdominal pain. Positive nausea/vomiting.  No diarrhea.  No constipation. Genitourinary: Negative for dysuria. Musculoskeletal: Positive for back pain. Skin: Negative for rash. Neurological: Negative for headaches, focal weakness or numbness.  10-point ROS otherwise negative.  ____________________________________________   PHYSICAL EXAM:  VITAL SIGNS: ED Triage Vitals  Enc Vitals Group     BP 10/18/20 0550 (!) 160/93     Pulse Rate 10/18/20 0550 63     Resp 10/18/20 0550 13     Temp 10/18/20 0550 98.3 F (36.8 C)     Temp Source 10/18/20 0550 Oral     SpO2 10/18/20 0550 98 %     Weight 10/18/20 0547 153  lb (69.4 kg)     Height 10/18/20 0547 6\' 2"  (1.88 m)    Constitutional: Alert and oriented. Patient appears uncomfortable with frequent shifting in bed.  Eyes: Conjunctivae are normal. Head: Atraumatic. Nose: No congestion/rhinnorhea. Mouth/Throat: Mucous membranes are moist. Neck: No stridor.   Cardiovascular: Normal rate, regular rhythm. Good peripheral circulation. Grossly normal heart sounds.   Respiratory: Normal respiratory effort.  No retractions. Lungs CTAB. Gastrointestinal: Soft and nontender. No distention.   Musculoskeletal: No lower extremity tenderness nor edema. No gross deformities of extremities. Neurologic:  Normal speech and language. No gross focal neurologic deficits are appreciated.  Skin:  Skin is warm, dry and intact. No rash noted.  ____________________________________________   LABS (all labs ordered are listed, but only abnormal results are displayed)  Labs Reviewed  COMPREHENSIVE METABOLIC PANEL  LIPASE, BLOOD  CBC WITH DIFFERENTIAL/PLATELET  TROPONIN I (HIGH SENSITIVITY)   ____________________________________________  EKG   EKG Interpretation  Date/Time:  Thursday October 18 2020 06:16:14 EDT Ventricular Rate:  78 PR Interval:  158 QRS Duration: 83 QT Interval:  378 QTC Calculation: 431 R Axis:   80 Text Interpretation: Sinus rhythm Anterior infarct, old Confirmed by Nanda Quinton [id number]) on 10/18/2020 6:21:52 AM        ____________________________________________  RADIOLOGY  CT pending   ____________________________________________   PROCEDURES  Procedure(s) performed:   Procedures  None ____________________________________________   INITIAL IMPRESSION / ASSESSMENT AND PLAN / ED COURSE  Pertinent labs & imaging results that were available during my care of the patient were reviewed by me and considered in my medical decision making (see chart for details).   Patient presents to the emergency department with abdominal and lower chest pain.  He has metastatic prostate cancer with known bony lesions.  His most recent CT scan did not show any pathologic fractures.  His presentation feels similar to prior pain episodes both at this emergency department and area department.  My suspicion for ACS, dissection, PE is low.  He is hypertensive but not tachycardic, hypoxic.  He has equal pulses.  Pain is more diffuse.  Abdominal exam is overall reassuring.  Will need additional imaging to rule out complication from his metastatic disease, bowel  obstruction, other acute surgical process.   CBC reviewed. CT and remaining labs are pending. Care transferred to Dr. Langston Masker pending results and re-evaluation after pain control attempts.  ____________________________________________  FINAL CLINICAL IMPRESSION(S) / ED DIAGNOSES  Final diagnoses:  Abdominal pain     MEDICATIONS GIVEN DURING THIS VISIT:  Medications  sodium chloride 0.9 % bolus 500 mL (has no administration in time range)  HYDROmorphone (DILAUDID) injection 1 mg (has no administration in time range)  ondansetron (ZOFRAN) injection 4 mg (has no administration in time range)    Note:  This document was prepared using Dragon voice recognition software and may include unintentional dictation errors.  Nanda Quinton, MD, [facility] Emergency [facility]    Latrease Kunde, [facility], MD 10/18/20 [phone]

## 2020-10-18 NOTE — ED Provider Notes (Signed)
Patient signed out to me by Dr long.  Briefly 60 yo male w/ metastatic cancer on chemo, presenting with diffuse abdominal pain and vomiting.  Similar visits in the past.  Pt underwent noncontrast CT (iodine allergies) which is result pending.  Labs pending.  Afebrile on arrival.  730 am -labs reviewed, appear to be close to baseline.  Very mild hypokalemia.  Alk phos is chronically elevated.  CT imaging reviewed and he is got diffuse osseous mets of the chest, pancreatic cysts, no obvious obstructive pathology of the gallbladder.  Liver enzymes and T bili are within normal limits.  He does have significant mount of retained stool, it is possible there is some constipation component to his abdominal pain.  Unfortunate the patient continues writhing in pain, reporting 10 out of 10 pain.  I have ordered additional 2 mg of Dilaudid to be given.  Troponin levels 4.  Lipase is within normal limits.  I doubt acute pancreatitis or acute coronary syndrome.  Clinical Course as of 10/18/20 1156  Thu Oct 18, 2020  0949 Patient continues to have intractable pain.  We will admit him for pain control. [MT]    Clinical Course User Index [MT] Tekesha Almgren, Carola Rhine, MD      Wyvonnia Dusky, MD 10/18/20 [phone]

## 2020-10-19 DIAGNOSIS — M549 Dorsalgia, unspecified: Secondary | ICD-10-CM | POA: Diagnosis present

## 2020-10-19 DIAGNOSIS — R3911 Hesitancy of micturition: Secondary | ICD-10-CM | POA: Diagnosis present

## 2020-10-19 DIAGNOSIS — Z515 Encounter for palliative care: Secondary | ICD-10-CM

## 2020-10-19 DIAGNOSIS — C61 Malignant neoplasm of prostate: Secondary | ICD-10-CM | POA: Diagnosis present

## 2020-10-19 DIAGNOSIS — E440 Moderate protein-calorie malnutrition: Secondary | ICD-10-CM | POA: Diagnosis present

## 2020-10-19 DIAGNOSIS — K5903 Drug induced constipation: Secondary | ICD-10-CM | POA: Diagnosis present

## 2020-10-19 DIAGNOSIS — Z681 Body mass index (BMI) 19 or less, adult: Secondary | ICD-10-CM | POA: Diagnosis not present

## 2020-10-19 DIAGNOSIS — Z20822 Contact with and (suspected) exposure to covid-19: Secondary | ICD-10-CM | POA: Diagnosis present

## 2020-10-19 DIAGNOSIS — Z7982 Long term (current) use of aspirin: Secondary | ICD-10-CM | POA: Diagnosis not present

## 2020-10-19 DIAGNOSIS — Z87891 Personal history of nicotine dependence: Secondary | ICD-10-CM | POA: Diagnosis not present

## 2020-10-19 DIAGNOSIS — C7951 Secondary malignant neoplasm of bone: Secondary | ICD-10-CM

## 2020-10-19 DIAGNOSIS — E876 Hypokalemia: Secondary | ICD-10-CM | POA: Diagnosis present

## 2020-10-19 DIAGNOSIS — Z7189 Other specified counseling: Secondary | ICD-10-CM | POA: Diagnosis not present

## 2020-10-19 DIAGNOSIS — G893 Neoplasm related pain (acute) (chronic): Secondary | ICD-10-CM | POA: Diagnosis present

## 2020-10-19 DIAGNOSIS — Z91041 Radiographic dye allergy status: Secondary | ICD-10-CM | POA: Diagnosis not present

## 2020-10-19 DIAGNOSIS — R1084 Generalized abdominal pain: Secondary | ICD-10-CM

## 2020-10-19 DIAGNOSIS — Z79899 Other long term (current) drug therapy: Secondary | ICD-10-CM | POA: Diagnosis not present

## 2020-10-19 DIAGNOSIS — Z7952 Long term (current) use of systemic steroids: Secondary | ICD-10-CM | POA: Diagnosis not present

## 2020-10-19 DIAGNOSIS — Z923 Personal history of irradiation: Secondary | ICD-10-CM | POA: Diagnosis not present

## 2020-10-19 DIAGNOSIS — R748 Abnormal levels of other serum enzymes: Secondary | ICD-10-CM | POA: Diagnosis present

## 2020-10-19 DIAGNOSIS — D72829 Elevated white blood cell count, unspecified: Secondary | ICD-10-CM | POA: Diagnosis present

## 2020-10-19 DIAGNOSIS — T402X5A Adverse effect of other opioids, initial encounter: Secondary | ICD-10-CM | POA: Diagnosis present

## 2020-10-19 DIAGNOSIS — I480 Paroxysmal atrial fibrillation: Secondary | ICD-10-CM | POA: Diagnosis present

## 2020-10-19 LAB — HIV ANTIBODY (ROUTINE TESTING W REFLEX): HIV Screen 4th Generation wRfx: NONREACTIVE

## 2020-10-19 LAB — CBC
HCT: 40.8 % (ref 39.0–52.0)
Hemoglobin: 13.5 g/dL (ref 13.0–17.0)
MCH: 29.9 pg (ref 26.0–34.0)
MCHC: 33.1 g/dL (ref 30.0–36.0)
MCV: 90.3 fL (ref 80.0–100.0)
Platelets: 227 10*3/uL (ref 150–400)
RBC: 4.52 MIL/uL (ref 4.22–5.81)
RDW: 14.6 % (ref 11.5–15.5)
WBC: 13.4 10*3/uL — ABNORMAL HIGH (ref 4.0–10.5)
nRBC: 0 % (ref 0.0–0.2)

## 2020-10-19 LAB — BASIC METABOLIC PANEL
Anion gap: 8 (ref 5–15)
BUN: 7 mg/dL (ref 6–20)
CO2: 25 mmol/L (ref 22–32)
Calcium: 8.9 mg/dL (ref 8.9–10.3)
Chloride: 109 mmol/L (ref 98–111)
Creatinine, Ser: 0.6 mg/dL — ABNORMAL LOW (ref 0.61–1.24)
GFR, Estimated: >60 mL/min (ref >60)
Glucose, Bld: 108 mg/dL — ABNORMAL HIGH (ref 70–99)
Potassium: 4.2 mmol/L (ref 3.5–5.1)
Sodium: 142 mmol/L (ref 135–145)

## 2020-10-19 MED ORDER — TAMSULOSIN HCL 0.4 MG PO CAPS
0.4000 mg | ORAL_CAPSULE | Freq: Every day | ORAL | Status: DC
  Administered 2020-10-19 – 2020-10-21 (×3): 0.4 mg via ORAL
  Filled 2020-10-19 (×3): qty 1

## 2020-10-19 MED ORDER — SENNA 8.6 MG PO TABS
2.0000 | ORAL_TABLET | Freq: Two times a day (BID) | ORAL | Status: DC
  Administered 2020-10-19 – 2020-10-21 (×4): 17.2 mg via ORAL
  Filled 2020-10-19 (×4): qty 2

## 2020-10-19 MED ORDER — POLYETHYLENE GLYCOL 3350 17 G PO PACK: 17.0000 g | PACK | Freq: Every day | ORAL | Status: DC | PRN

## 2020-10-19 MED ORDER — SENNA 8.6 MG PO TABS
1.0000 | ORAL_TABLET | Freq: Two times a day (BID) | ORAL | Status: DC
  Administered 2020-10-19: 8.6 mg via ORAL
  Filled 2020-10-19: qty 1

## 2020-10-19 MED ORDER — POLYETHYLENE GLYCOL 3350 17 G PO PACK
17.0000 g | PACK | Freq: Two times a day (BID) | ORAL | Status: DC
  Administered 2020-10-19: 17 g via ORAL
  Filled 2020-10-19: qty 1

## 2020-10-19 MED ORDER — FENTANYL 100 MCG/HR TD PT72
1.0000 | MEDICATED_PATCH | TRANSDERMAL | Status: DC
  Administered 2020-10-19: 1 via TRANSDERMAL
  Filled 2020-10-19: qty 2

## 2020-10-19 MED ORDER — ADULT MULTIVITAMIN W/MINERALS CH
1.0000 | ORAL_TABLET | Freq: Every day | ORAL | Status: DC
  Administered 2020-10-19 – 2020-10-21 (×3): 1 via ORAL
  Filled 2020-10-19 (×3): qty 1

## 2020-10-19 MED ORDER — SODIUM CHLORIDE 0.9 % IV SOLN: INTRAVENOUS | Status: DC

## 2020-10-19 MED ORDER — BOOST / RESOURCE BREEZE PO LIQD CUSTOM
1.0000 | Freq: Three times a day (TID) | ORAL | Status: DC
  Administered 2020-10-19 – 2020-10-20 (×3): 1 via ORAL

## 2020-10-19 MED ORDER — FENTANYL 100 MCG/HR TD PT72
1.0000 | MEDICATED_PATCH | TRANSDERMAL | Status: DC
  Administered 2020-10-19: 1 via TRANSDERMAL

## 2020-10-19 MED ORDER — POLYETHYLENE GLYCOL 3350 17 G PO PACK
17.0000 g | PACK | Freq: Two times a day (BID) | ORAL | Status: DC
  Administered 2020-10-19 – 2020-10-21 (×4): 17 g via ORAL
  Filled 2020-10-19 (×4): qty 1

## 2020-10-19 NOTE — Progress Notes (Addendum)
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 fib, prior history of drug abuse who presented with abdominal, chest wall pain, vomiting.  Has history of diffuse bony metastasis.  On narcotics at home.  Also complained of constipation.  On presentation he was mildly febrile, hypertensive.  Lab work showed hypokalemia, elevated alkaline phosphatase.  EKG, troponins were normal.  CT abdomen/pelvis showed diffuse osseous metastatic disease throughout the chest, abdomen, pelvis, no pathological fracture identified.  Also showed moderate retained stool in the right colon.  Patient was admitted for the management of pain, constipation.  Palliative care also consulted for goals of care discussion.   Assessment & Plan:   Principal Problem:   Abdominal pain Active Problems:   Back pain without radiculopathy   Atrial fibrillation (HCC)   Prostate cancer metastatic to multiple sites [facility])   Prostate cancer metastatic to bone (HCC)  Diffuse pain/chronic pain syndrome: Due to malignancy, bony metastasis.  Chest pain was atypical.  EKG/troponin were unremarkable.  On fentanyl patch, oxycodone at home.  Resumed pain medications.  Continue PPI  ppx.  Continue gentle IV fluids. Complains of abdominal pain.  Abdomen/pelvis CT scan did not show any acute intra-abdominal findings.  Metastatic prostate cancer: Currently on chemo.  Follows with Dr. Marin Olp.  Status post radiation therapy for spinal metastasis.  Was also on prednisone for severe pain.  Palliative care consulted for goals of care.  Paroxysmal A. fib: On NSR.On metoprolol for rate control, aspirin  Constipation: No bowel movement for last several days.  Stool burden seen on the CT.  Continue aggressive bowel regimen  Urinary hesitancy:  In the setting of prostate cancer.  Started on Flomax  Hypokalemia: Supplemented and corrected  Leukocytosis: Most likely reactive and also from chronic prednisone use.  Continue to monitor  Decreased appetite: We will request for dietitian consultation         DVT prophylaxis:Lovenox Code Status: Full Family Communication: None at bedside Status TD:[id number]    Dispo: The patient is from: Home              Anticipated d/c is to: Home              Patient currently is not medically stable to d/c.   Difficult to place patient No     Consultants: None  Procedures:None  Antimicrobials:  Anti-infectives (From admission, onward)    None       Subjective:  Patient seen and examined at the bedside.  Overall looks comfortable.  Hemodynamically stable.  Complains of pain everywhere.  Says he could not tolerate the diet.  He lives with his wife, ambulates without any problem   Objective: Vitals:   10/18/20 1130 10/18/20 1337 10/18/20 2116 10/19/20 0602  BP: (!) 148/84 (!) 166/94 (!) 155/98 (!) 147/95  Pulse: 66 (!) 59 67 72  Resp: 18 18 16 16   Temp:  99.1 F (37.3 C) 98.9 F (37.2 C) 98.4 F (36.9 C)  TempSrc:  Oral Oral Oral  SpO2: 98% 100% 99% 100%  Weight:      Height:        Intake/Output Summary (Last 24 hours) at 10/19/2020 0740 Last data filed at 10/19/2020 0200 Gross per 24 hour  Intake 851.24 ml  Output --  Net 851.24 ml  Filed Weights   10/18/20 0547  Weight: 69.4 kg    Examination:  General exam: Overall comfortable, not in distress, looks chronically ill looking, deconditioned HEENT: PERRL Respiratory system:  no wheezes or crackles  Cardiovascular system: S1 & S2 heard, RRR.  Chemo-Port on the right chest Gastrointestinal system: Abdomen is nondistended, soft and nontender. Central nervous system: Alert and oriented Extremities: No edema, no clubbing ,no cyanosis Skin: No rashes, no ulcers,no icterus      Data Reviewed: I have  personally reviewed following labs and imaging studies  CBC: Recent Labs  Lab 10/18/20 0649 10/19/20 0413  WBC 13.3* 13.4*  NEUTROABS 9.4*  --   HGB 13.8 13.5  HCT 40.1 40.8  MCV 88.3 90.3  PLT 251 182   Basic Metabolic Panel: Recent Labs  Lab 10/18/20 0649 10/19/20 0413  NA 135 142  K 3.3* 4.2  CL 99 109  CO2 25 25  GLUCOSE 119* 108*  BUN 8 7  CREATININE 0.72 0.60*  CALCIUM 8.6* 8.9   GFR: Estimated Creatinine Clearance: 97.6 mL/min (A) (by C-G formula based on SCr of 0.6 mg/dL (L)). Liver Function Tests: Recent Labs  Lab 10/18/20 0649  AST 15  ALT 13  ALKPHOS 278*  BILITOT 0.6  PROT 7.1  ALBUMIN 4.3   Recent Labs  Lab 10/18/20 0649  LIPASE 18   No results for input(s): AMMONIA in the last 168 hours. Coagulation Profile: No results for input(s): INR, PROTIME in the last 168 hours. Cardiac Enzymes: No results for input(s): CKTOTAL, CKMB, CKMBINDEX, TROPONINI in the last 168 hours. BNP (last 3 results) No results for input(s): PROBNP in the last 8760 hours. HbA1C: No results for input(s): HGBA1C in the last 72 hours. CBG: No results for input(s): GLUCAP in the last 168 hours. Lipid Profile: No results for input(s): CHOL, HDL, LDLCALC, TRIG, CHOLHDL, LDLDIRECT in the last 72 hours. Thyroid Function Tests: No results for input(s): TSH, T4TOTAL, FREET4, T3FREE, THYROIDAB in the last 72 hours. Anemia Panel: No results for input(s): VITAMINB12, FOLATE, FERRITIN, TIBC, IRON, RETICCTPCT in the last 72 hours. Sepsis Labs: No results for input(s): PROCALCITON, LATICACIDVEN in the last 168 hours.  Recent Results (from the past 240 hour(s))  Resp Panel by RT-PCR (Flu A&B, Covid) Nasopharyngeal Swab     Status: None   Collection Time: 10/18/20  9:50 AM   Specimen: Nasopharyngeal Swab; Nasopharyngeal(NP) swabs in vial transport medium  Result Value Ref Range Status   SARS Coronavirus 2 by RT PCR NEGATIVE NEGATIVE Final    Comment: (NOTE) SARS-CoV-2 target  nucleic acids are NOT DETECTED.  The SARS-CoV-2 RNA is generally detectable in upper respiratory specimens during the acute phase of infection. The lowest concentration of SARS-CoV-2 viral copies this assay can detect is 138 copies/mL. A negative result does not preclude SARS-Cov-2 infection and should not be used as the sole basis for treatment or other patient management decisions. A negative result may occur with  improper specimen collection/handling, submission of specimen other than nasopharyngeal swab, presence of viral mutation(s) within the areas targeted by this assay, and inadequate number of viral copies(<138 copies/mL). A negative result must be combined with clinical observations, patient history, and epidemiological information. The expected result is Negative.  Fact Sheet for Patients:  EntrepreneurPulse.com.au  Fact Sheet for Healthcare Providers:  IncredibleEmployment.be  This test is no t yet approved or cleared by the [place] FDA and  has been authorized for detection and/or diagnosis of SARS-CoV-2 by FDA under an Emergency  Use Authorization (EUA). This EUA will remain  in effect (meaning this test can be used) for the duration of the COVID-19 declaration under Section 564(b)(1) of the Act, 21 U.S.C.section 360bbb-3(b)(1), unless the authorization is terminated  or revoked sooner.       Influenza A by PCR NEGATIVE NEGATIVE Final   Influenza B by PCR NEGATIVE NEGATIVE Final    Comment: (NOTE) The Xpert Xpress SARS-CoV-2/FLU/RSV plus assay is intended as an aid in the diagnosis of influenza from Nasopharyngeal swab specimens and should not be used as a sole basis for treatment. Nasal washings and aspirates are unacceptable for Xpert Xpress SARS-CoV-2/FLU/RSV testing.  Fact Sheet for Patients: EntrepreneurPulse.com.au  Fact Sheet for Healthcare  Providers: IncredibleEmployment.be  This test is not yet approved or cleared by the [place] FDA and has been authorized for detection and/or diagnosis of SARS-CoV-2 by FDA under an Emergency Use Authorization (EUA). This EUA will remain in effect (meaning this test can be used) for the duration of the COVID-19 declaration under Section 564(b)(1) of the Act, 21 U.S.C. section 360bbb-3(b)(1), unless the authorization is terminated or revoked.  Performed at [facility], [address] [place], [place] [address]          Radiology Studies: CT CHEST ABDOMEN PELVIS WO CONTRAST  Result Date: 10/18/2020 CLINICAL DATA:  60 year old male with metastatic prostate cancer. Undergoing chemotherapy. Acute abdominal and back pain. Vomiting. EXAM: CT CHEST, ABDOMEN AND PELVIS WITHOUT CONTRAST TECHNIQUE: Multidetector CT imaging of the chest, abdomen and pelvis was performed following the standard protocol without IV contrast. COMPARISON:  [facility] CT Abdomen and Pelvis 09/26/2020. [place] 08/26/2020, PET-CT 08/14/2020 and earlier. FINDINGS: CT CHEST FINDINGS Cardiovascular: Right chest Port-A-Cath now in place. No cardiomegaly or pericardial effusion. Vascular patency is not evaluated in the absence of IV contrast. Mediastinum/Nodes: No mediastinal mass or lymphadenopathy is evident in the absence of IV contrast. Lungs/Pleura: Major airways are patent. Mild linear lower lobe scarring is stable, with less associated atelectasis since July. Stable similar mild apical scarring. No pulmonary metastatic disease or pleural effusion. Musculoskeletal: Diffuse osseous metastatic disease, predominantly sclerotic lesions throughout the thorax including the bilateral ribs, sternum and scapulae. No acute pathologic fracture is identified. No noncontrast CT evidence of thoracic epidural tumor. CT ABDOMEN PELVIS FINDINGS Hepatobiliary:  Stable and negative noncontrast liver and gallbladder. Pancreas: Roughly 3 cm cystic mass at the tail of the pancreas remains stable. And in July was compared back to 2018 and stable. Spleen: Stable, negative. Adrenals/Urinary Tract: Stable, negative. Stomach/Bowel: No dilated large or small bowel. Redundant sigmoid colon in the pelvis. Moderate retained stool has increased in the right colon since last month. The cecum is partially located in the pelvis. Appendix not identified. No pericecal inflammation. No dilated small bowel. Decompressed stomach and duodenum. No free air, free fluid or mesenteric inflammation identified. Vascular/Lymphatic: Normal caliber abdominal aorta. Minimal calcified atherosclerosis. Vascular patency is not evaluated in the absence of IV contrast. No lymphadenopathy identified. Reproductive: Stable diminutive prostate. Other: No pelvic free fluid. Musculoskeletal: Diffuse osseous metastatic disease throughout the spine, pelvis and LEs affecting the proximal femurs. No pathologic fracture identified. No noncontrast CT evidence of lumbar epidural tumor. IMPRESSION: 1. Diffuse osseous metastatic disease throughout the chest, abdomen, and pelvis. No pathologic fracture identified. No obvious extra-osseous tumor extension. 2. No other acute or metastatic process identified on this noncontrast exam. Moderate retained stool in the right colon has increased since last month.  Electronically Signed   By: Genevie Ann M.D.   On: 10/18/2020 07:09        Scheduled Meds:  aspirin  81 mg Oral Daily   docusate sodium  100 mg Oral BID   enoxaparin (LOVENOX) injection  40 mg Subcutaneous Q24H   famotidine  40 mg Oral BID   [START ON 10/21/2020] fentaNYL  2 patch Transdermal Q72H   gabapentin  800 mg Oral TID   metoprolol tartrate  12.5 mg Oral BID   pantoprazole (PROTONIX) IV  40 mg Intravenous QHS   polyethylene glycol  17 g Oral Daily   predniSONE  5 mg Oral BID WC   Continuous Infusions:    LOS: 0 days    Time spent: 35 mins.More than 50% of that time was spent in counseling and/or coordination of care.      Shelly Coss, MD [facility] P9/23/2022, 7:40 AM

## 2020-10-19 NOTE — Progress Notes (Signed)
Initial Nutrition Assessment  DOCUMENTATION CODES:   Non-severe (moderate) malnutrition in context of chronic illness  INTERVENTION:   -Boost Breeze po TID, each supplement provides 250 kcal and 9 grams of protein  -Multivitamin with minerals daily  Once pain improves, recommend Ensure Plus PO TID, each provides 350 kcals and 13g protein  NUTRITION DIAGNOSIS:   Moderate Malnutrition related to chronic illness, cancer and cancer related treatments as evidenced by percent weight loss, moderate fat depletion, moderate muscle depletion.  GOAL:   Patient will meet greater than or equal to 90% of their needs  Not meeting.  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 fib, prior history of drug abuse who presented with abdominal, chest wall pain, vomiting. Patient was admitted for the management of pain, constipation.  Patient in room reports poor appetite d/t increasing pain that started 2 days PTA. Prior to these symptoms he reports he was eating well. Was having constipation issues PTA. States he had a BM today following an enema. Pt still with some pain after drinking some milk and some nausea. Advised pt to not drink dairy at this time until pain is more resolved. Pt also sipping on juices. Does not feel ready to start solids. Is willing to try Boost Breeze at this time. Recommend Ensure once pain subsides and has more BMs.   Pt reports his recorded weight of 153 lbs on 9/22 was stated and no one weighed him. Placed order for pt to be weighed to assess weight status. Per weight records, pt weighed 173 lbs on 3/16 so this would be significant weight loss if pt weighs in the 150s.  Medications: Colace, Pepcid, Multivitamin with minerals daily, Miralax, Senokot,  Lactulose  Labs reviewed.   NUTRITION - FOCUSED PHYSICAL EXAM:  Flowsheet Row Most Recent Value  Orbital Region Moderate depletion  Upper Arm Region Moderate depletion  Thoracic and Lumbar Region Unable to assess  Buccal Region Mild depletion  Temple Region Moderate depletion  Clavicle Bone Region Moderate depletion  Clavicle and Acromion Bone Region Moderate depletion  Scapular Bone Region Mild depletion  Dorsal Hand Moderate depletion  Patellar Region Unable to assess  Anterior Thigh Region Unable to assess  Posterior Calf Region Unable to assess  Edema (RD Assessment) None  Hair Reviewed  Eyes Reviewed  Mouth Reviewed  Skin Reviewed       Diet Order:   Diet Order             Diet regular Room service appropriate? Yes; Fluid consistency: Thin  Diet effective now                   EDUCATION NEEDS:   Education needs have been addressed  Skin:  Skin Assessment: Reviewed RN Assessment  Last BM:  9/23 per pt report  Height:   Ht Readings from Last 1 Encounters:  10/18/20 6\' 2"  (1.88 m)    Weight:   Wt Readings from Last 1 Encounters:  10/18/20 69.4 kg    BMI:  Body mass index is 19.64 kg/m.  Estimated Nutritional Needs:   Kcal:  2100-2300  Protein:  105-115g  Fluid:  2.1L/day  Clayton Bibles, MS, RD, LDN Inpatient Clinical Dietitian Contact information available via Amion

## 2020-10-19 NOTE — Consult Note (Addendum)
                                                                                 Consultation Note Date: 10/19/2020   Patient Name: Ryan Davis  DOB: 01/22/1961  MRN: [id number]  Age / Sex: 60 y.o., male  PCP: [name], [name], PA-C Referring Physician: Adhikari, Amrit, MD  Reason for Consultation: Establishing goals of care and Pain control  HPI/Patient Profile: 60 y.o. 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  He additionally had urinary hesitancy but this is resolved.  Work-up also revealed retained stool.  Palliative consulted for goals and pain management recommendation..   Clinical Assessment and Goals of Care: Palliative care consult received.  Chart reviewed including personal review of pertinent labs and imaging.  I met today with Ryan Davis.  He was lying in bed in no distress at time of my encounter.  I introduced palliative care as specialized medical care for people living with serious illness. It focuses on providing relief from the symptoms and stress of a serious illness. The goal is to improve quality of life for both the patient and the family.  We discussed his pain and he reports that he has had sharp 8 out of 10 pain that is located above his bellybutton (but little more left-sided and rotates through to his back.  It is worse with certain movements and is partially relieved with pain medication.  He finds Dilaudid he has been receiving here in the hospital to be more help than oxycodone he has been getting at home.  I examined him and currently he is wearing a single 100 mcg/hr fentanyl patch.  I reviewed his PDMP earlier today and he was currently prescribed to be wearing 200 mcg/h of fentanyl in addition to utilizing oxycodone 20 mg as needed for breakthrough pain.  He tells me that his wife is disabled and he  was helping her do something and forgot to change his patch and also must have forgotten to place the second patch on it time he changed his last patch he is wearing.  He tells me patch was last changed on Sunday.  We discussed utilization of pain medications and using long and short acting agent for control of cancer pain.  He tells me that he simply forgot to change patches and he has more of them at home to use.  When talking with him about his pain further, it does appear that pain did get acutely worse beginning around Sunday (when he did not remember place second patch) and got even worse 2 days ago (which would be when the patch she is currently wearing and hit around the 72-hour Davis and should have been changed).  He did confirm to me that he had been wearing 2 of the 100 mcg/h fentanyl patches at the same time prior to this.  We discussed plan to reinstate his home regimen of fentanyl 200 mcg/h, continue rescue Dilaudid as needed overnight, and work to transition back to oral rescue medication tomorrow.  Discussed this   may be resuming home dose of oxycodone, increasing dose of oxycodone, or rotating to another medication for oral rescue (likely Dilaudid as it seems to work well for him here in the hospital in IV form).  We discussed advance care planning.  We reviewed paperwork he has on file naming his sister and brother as his surrogate decision makers in the event he cannot make his own medical decisions.  He confirms that this is up-to-date with his current wishes.  I also attempted to discuss with him regarding long-term goals of care, his understanding of his disease, and his hopes for his care in the future.  He tells me that he relies on Ryan Davis to help him understand what is going on from the standpoint of his cancer and he would like to wait to discuss further with him rather than having further discussion with me regarding long-term goals of care.  We also discussed opioid related  constipation and planning to increase his bowel regimen.   Questions and concerns addressed.   PMT will continue to support holistically.  SUMMARY OF RECOMMENDATIONS   -Pain, cancer related: He continues to report uncontrolled pain.  In talking with him and examining him today it appears that he is only wearing one 100 mcg patch which is less than the currently prescribed dose of 200 mcg/h.  In addition to this, he tells me that his patch was last changed on Sunday.  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  He states he forgot to change his current fentanyl patch and add on second patch. We will therefore begin by replacing fentanyl patches at previous home dose that controlled his pain (which was 200 mcg/h).  We will continue with as needed IV Dilaudid for breakthrough pain until patches had steady state.  At that point in time we can work to convert him back over to oral rescue medication as well.  This may be either his home prior dose of oxycodone or it may be rotating from oxycodone to hydromorphone.  Additionally, he is on steroids for an adjunct.  Question if he may benefit from another dose of Zometa (as best I can tell his last dose was on July 12, 2020) but will defer this to oncology. -Constipation, opioid related: Increase senna to 2 tabs twice daily.  Will DC Colace as her low evidence that this is beneficial in opioid related constipation. -He has documents in his chart designating his sister Ryan Davis) and brother Ryan Davis) as his surrogate decision makers in the event he cannot make his own decisions.  He confirms to me that this is up-to-date. -He follows with Ryan Davis and trust his opinion greatly.  He would like to discuss with him prior to any other conversations regarding long-term goals of care.  Code Status/Advance Care Planning: Full code  Symptom Management:  As above  Palliative Prophylaxis:  Frequent Pain  Assessment  Additional Recommendations (Limitations, Scope, Preferences): Full Scope Treatment  Psycho-social/Spiritual:  Desire for further Chaplaincy support:no Additional Recommendations: Caregiving  Support/Resources  Prognosis:  Unable to determine  Discharge Planning: Home with Home Health      Primary Diagnoses: Present on Admission:  Abdominal pain  Atrial fibrillation (HCC)  Back pain without radiculopathy  Prostate cancer metastatic to multiple sites [facility])  Prostate cancer metastatic to bone [facility])   I have reviewed the medical record, interviewed the patient and family, and examined the patient.  The following aspects are pertinent.  Past Medical History:  Diagnosis Date   A-fib (HCC)    see 03-28-16 admission   Back pain without radiculopathy 03/28/2016   Dyspnea    endorses since treatment radiation , still gets SOB occasionally    Dysrhythmia    afib    Goals of care, counseling/discussion 06/24/2017   History of radiation therapy 09/16/16-10/06/16   35 Gy in 14 fractions to the lumbar spine   Nocturia    Prostate cancer metastatic to bone (HCC) 03/31/2016   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HCC) 03/31/2016   Social History   Socioeconomic History   Marital status: Married    Spouse name: Not on file   Number of children: Not on file   Years of education: Not on file   Highest education level: Not on file  Occupational History   Not on file  Tobacco Use   Smoking status: Former    Packs/day: 0.50    Years: 39.00    Pack years: 19.50    Types: Cigarettes   Smokeless tobacco: Never  Vaping Use   Vaping Use: Never used  Substance and Sexual Activity   Alcohol use: Not Currently   Drug use: No   Sexual activity: Not on file  Other Topics Concern   Not on file  Social History Narrative   Not on file   Social Determinants of Health   Financial Resource Strain: Not on file  Food Insecurity: Not on file  Transportation Needs: Not on  file  Physical Activity: Not on file  Stress: Not on file  Social Connections: Not on file   Family History  Problem Relation Age of Onset   Stroke Sister    Lupus Mother    Cancer Neg Hx    Heart disease Neg Hx    Diabetes Neg Hx    Scheduled Meds:  aspirin  81 mg Oral Daily   docusate sodium  100 mg Oral BID   enoxaparin (LOVENOX) injection  40 mg Subcutaneous Q24H   famotidine  40 mg Oral BID   feeding supplement  1 Container Oral TID BM   fentaNYL  1 patch Transdermal Q72H   And   fentaNYL  1 patch Transdermal Q72H   gabapentin  800 mg Oral TID   metoprolol tartrate  12.5 mg Oral BID   multivitamin with minerals  1 tablet Oral Daily   pantoprazole (PROTONIX) IV  40 mg Intravenous QHS   polyethylene glycol  17 g Oral BID   predniSONE  5 mg Oral BID WC   senna  2 tablet Oral BID   tamsulosin  0.4 mg Oral Daily   Continuous Infusions:  sodium chloride 75 mL/hr at 10/19/20 1519   PRN Meds:.bisacodyl, HYDROmorphone (DILAUDID) injection, lactulose, metoprolol tartrate, oxyCODONE Medications Prior to Admission:  Prior to Admission medications   Medication Sig Start Date End Date Taking? Authorizing Provider  aspirin 81 MG chewable tablet Chew 81 mg by mouth daily. 04/09/17  Yes [provider]  famotidine (PEPCID) 40 MG tablet Take 1 tablet (40 mg total) by mouth 2 (two) times daily. 09/27/20  Yes Ennever, Peter R, MD  fentaNYL (DURAGESIC) 100 MCG/HR Place 2 patches onto the skin every 3 (three) days. 09/27/20  Yes Ennever, Peter R, MD  gabapentin (NEURONTIN) 800 MG tablet Take 800 mg by mouth 3 (three) times daily. May take an additional 400mg as needed for pain   Yes [provider]  lidocaine-prilocaine (EMLA)   cream Apply to affected area once Patient taking differently: Apply 1 application topically as needed (to port). 09/06/20  Yes Ennever, Peter R, MD  metoprolol tartrate (LOPRESSOR) 25 MG tablet TAKE 1/2 TABLET(12.5 MG) BY MOUTH TWICE DAILY Patient taking  differently: Take 12.5 mg by mouth 2 (two) times daily. 09/24/20  Yes Ennever, Peter R, MD  Naloxone HCl 0.4 MG/0.4ML SOAJ Use in the event of accidental overdose of opioids. 08/26/20  Yes Wright, Anna G, MD  ondansetron (ZOFRAN) 8 MG tablet Take 1 tablet (8 mg total) by mouth 2 (two) times daily as needed for refractory nausea / vomiting. 09/06/20  Yes Ennever, Peter R, MD  oxyCODONE 20 MG TABS Take 1 tablet (20 mg total) by mouth every 4 (four) hours as needed for severe pain. 08/31/20  Yes Ennever, Peter R, MD  predniSONE (DELTASONE) 5 MG tablet Take 1 tablet (5 mg total) by mouth in the morning and at bedtime. 09/06/20  Yes Ennever, Peter R, MD  prochlorperazine (COMPAZINE) 10 MG tablet Take 1 tablet (10 mg total) by mouth every 6 (six) hours as needed (Nausea or vomiting). 09/06/20  Yes Ennever, Peter R, MD   Allergies  Allergen Reactions   Contrast Media [Iodinated Diagnostic Agents] Nausea And Vomiting    Patient vomits immediately after IV contrast is injected just prior to start of scan, was unable to scan patient in correct time frame.  Patient did not warn me of this when dosing the patient with oral CM and asking the patient history questions until the patient was on the table for the CT scan just prior to injection.  Ct Tech:  Jenna McLawhon 07/29/17    Other Nausea And Vomiting    Patient vomits immediately after IV contrast is injected just prior to start of scan, was unable to scan patient in correct time frame.  Patient did not warn me of this when dosing the patient with oral CM and asking the patient history questions until the patient was on the table for the CT scan just prior to injection.  Ct Tech:  Jenna McLawhon 07/29/17    Review of Systems  Constitutional:  Positive for activity change, fatigue and unexpected weight change.  Gastrointestinal:  Positive for abdominal distention, abdominal pain and constipation.  Neurological:  Positive for weakness.  Psychiatric/Behavioral:  Positive  for sleep disturbance.    Physical Exam General: Alert, awake, in no acute distress.   HEENT: No bruits, no goiter, no JVD Heart: Regular rate and rhythm. No murmur appreciated. Lungs: Good air movement, clear Abdomen: Soft, nontender, nondistended, positive bowel sounds.   Ext: No significant edema Skin: Warm and dry Neuro: Grossly intact, nonfocal.   Vital Signs: BP (!) 136/97 (BP Location: Left Arm)   Pulse 84   Temp 98.4 F (36.9 C) (Oral)   Resp 18   Ht 6' 2" (1.88 Davis)   Wt 69.4 kg   SpO2 100%   BMI 19.64 kg/Davis  Pain Scale: 0-10 POSS *See Group Information*: S-Acceptable,Sleep, easy to arouse Pain Score: 8    SpO2: SpO2: 100 % O2 Device:SpO2: 100 % O2 Flow Rate: .   IO: Intake/output summary:  Intake/Output Summary (Last 24 hours) at 10/19/2020 1602 Last data filed at 10/19/2020 1519 Gross per 24 hour  Intake 751.12 ml  Output 0 ml  Net 751.12 ml    LBM: Last BM Date: 10/16/20 Baseline Weight: Weight: 69.4 kg Most recent weight: Weight: 69.4 kg     Palliative Assessment/Data:   Flowsheet   Rows    Flowsheet Row Most Recent Value  Intake Tab   Referral Department Hospitalist  Unit at Time of Referral Med/Surg Unit  Palliative Care Primary Diagnosis Pain  Date Notified 10/18/20  Palliative Care Type New Palliative care  Reason for referral Pain, Clarify Goals of Care  Date of Admission 10/18/20  Date first seen by Palliative Care 10/19/20  # of days Palliative referral response time 1 Day(s)  # of days IP prior to Palliative referral 0  Clinical Assessment   Palliative Performance Scale Score 30%  Psychosocial & Spiritual Assessment   Palliative Care Outcomes   Patient/Family meeting held? Yes  Who was at the meeting? Patient       Time In: 1440 Time Out: 1600 Time Total: 80 Greater than 50%  of this time was spent counseling and coordinating care related to the above assessment and plan.  Signed by: Gene Freeman, MD   Please contact  Palliative Medicine Team phone at [phone] for questions and concerns.  For individual provider: See Amion              

## 2020-10-20 DIAGNOSIS — T402X5A Adverse effect of other opioids, initial encounter: Secondary | ICD-10-CM

## 2020-10-20 DIAGNOSIS — K5903 Drug induced constipation: Secondary | ICD-10-CM

## 2020-10-20 LAB — CBC WITH DIFFERENTIAL/PLATELET
Abs Immature Granulocytes: 0.19 10*3/uL — ABNORMAL HIGH (ref 0.00–0.07)
Basophils Absolute: 0.1 10*3/uL (ref 0.0–0.1)
Basophils Relative: 1 %
Eosinophils Absolute: 0 10*3/uL (ref 0.0–0.5)
Eosinophils Relative: 0 %
HCT: 36.2 % — ABNORMAL LOW (ref 39.0–52.0)
Hemoglobin: 11.8 g/dL — ABNORMAL LOW (ref 13.0–17.0)
Immature Granulocytes: 2 %
Lymphocytes Relative: 26 %
Lymphs Abs: 2.9 10*3/uL (ref 0.7–4.0)
MCH: 30 pg (ref 26.0–34.0)
MCHC: 32.6 g/dL (ref 30.0–36.0)
MCV: 92.1 fL (ref 80.0–100.0)
Monocytes Absolute: 1.2 10*3/uL — ABNORMAL HIGH (ref 0.1–1.0)
Monocytes Relative: 10 %
Neutro Abs: 7.1 10*3/uL (ref 1.7–7.7)
Neutrophils Relative %: 61 %
Platelets: 173 10*3/uL (ref 150–400)
RBC: 3.93 MIL/uL — ABNORMAL LOW (ref 4.22–5.81)
RDW: 14.8 % (ref 11.5–15.5)
WBC: 11.5 10*3/uL — ABNORMAL HIGH (ref 4.0–10.5)
nRBC: 0 % (ref 0.0–0.2)

## 2020-10-20 LAB — BASIC METABOLIC PANEL
Anion gap: 5 (ref 5–15)
BUN: 7 mg/dL (ref 6–20)
CO2: 24 mmol/L (ref 22–32)
Calcium: 8.1 mg/dL — ABNORMAL LOW (ref 8.9–10.3)
Chloride: 107 mmol/L (ref 98–111)
Creatinine, Ser: 0.59 mg/dL — ABNORMAL LOW (ref 0.61–1.24)
GFR, Estimated: >60 mL/min (ref >60)
Glucose, Bld: 108 mg/dL — ABNORMAL HIGH (ref 70–99)
Potassium: 4.1 mmol/L (ref 3.5–5.1)
Sodium: 136 mmol/L (ref 135–145)

## 2020-10-20 MED ORDER — ACETAMINOPHEN 500 MG PO TABS
1000.0000 mg | ORAL_TABLET | Freq: Three times a day (TID) | ORAL | Status: DC
  Administered 2020-10-20 – 2020-10-21 (×3): 1000 mg via ORAL
  Filled 2020-10-20 (×3): qty 2

## 2020-10-20 MED ORDER — OXYCODONE HCL 5 MG PO TABS
20.0000 mg | ORAL_TABLET | ORAL | Status: DC | PRN
  Administered 2020-10-20 – 2020-10-21 (×3): 20 mg via ORAL
  Filled 2020-10-20 (×3): qty 4

## 2020-10-20 MED ORDER — HYDROMORPHONE HCL 1 MG/ML IJ SOLN
1.0000 mg | INTRAMUSCULAR | Status: DC | PRN
Start: 2020-10-20 — End: 2020-10-21
  Administered 2020-10-20 – 2020-10-21 (×2): 1 mg via INTRAVENOUS
  Filled 2020-10-20 (×2): qty 1

## 2020-10-20 NOTE — Progress Notes (Signed)
PROGRESS NOTE    Ryan Davis  FTD:[id number] DOB: [birth year] DOA: 10/18/2020 PCP: Robert Bellow, PA-C   Chief Complain:Abd pain  Brief Narrative: Patient is a 60 year old male with history of metastatic prostate cancer to multiple sites including bone, A. fib, prior history of drug abuse who presented with abdominal, chest wall pain, vomiting.  Has history of diffuse bony metastasis.  On narcotics at home.  Also complained of constipation.  On presentation he was mildly febrile, hypertensive.  Lab work showed hypokalemia, elevated alkaline phosphatase.  EKG, troponins were normal.  CT abdomen/pelvis showed diffuse osseous metastatic disease throughout the chest, abdomen, pelvis, no pathological fracture identified.  Also showed moderate retained stool in the right colon.  Patient was admitted for the management of pain, constipation.  Palliative care also consulted for goals of care discussion.   Assessment & Plan:   Principal Problem:   Abdominal pain Active Problems:   Back pain without radiculopathy   Atrial fibrillation (HCC)   Prostate cancer metastatic to multiple sites [facility])   Prostate cancer metastatic to bone (HCC)  Diffuse pain/chronic pain syndrome: Due to malignancy, bony metastasis.  Chest pain was atypical.  EKG/troponin were unremarkable.  On fentanyl patch, oxycodone at home.  Resumed pain medications.  Continue PPI  ppx.  Continue gentle IV fluids. Complains of abdominal pain.  Abdomen/pelvis CT scan did not show any acute intra-abdominal findings.  Metastatic prostate cancer: Currently on chemo.  Follows with Dr. Marin Olp.  Status post radiation therapy for spinal metastasis.  Was also on prednisone for severe pain.  Palliative care consulted for goals of care.  Remains full code.  Paroxysmal A. fib: On NSR.On metoprolol for rate control, aspirin  Constipation: No bowel movement for last several days.  Stool burden seen on the CT.  Continue aggressive bowel  regimen  Urinary hesitancy: In the setting of prostate cancer.  Started on Flomax  Hypokalemia: Supplemented and corrected  Leukocytosis: Most likely reactive and also from chronic prednisone use.  Continue to monitor  Decreased appetite:  Dietician following  Nutrition Problem: Moderate Malnutrition Etiology: chronic illness, cancer and cancer related treatments      DVT prophylaxis:Lovenox Code Status: Full Family Communication: None at bedside Status CW:[id number]    Dispo: The patient is from: Home              Anticipated d/c is to: Home tomorrow              Patient currently is not medically stable to d/c.   Difficult to place patient No     Consultants: None  Procedures:None  Antimicrobials:  Anti-infectives (From admission, onward)    None       Subjective:  Patient seen and examined at the bedside this morning.  Hemodynamically stable.  He looks better than yesterday.  He feels more comfortable today.  He wants to stay 1 more night and wants to go home tomorrow ,says he still has some abdominal discomfort.  Objective: Vitals:   10/19/20 0951 10/19/20 1333 10/19/20 2048 10/20/20 0621  BP: 125/90 (!) 136/97 (!) 143/92 98/68  Pulse: 84 84 82 74  Resp: 18  18 18   Temp: 98.4 F (36.9 C) 98.4 F (36.9 C) 98.8 F (37.1 C) 98 F (36.7 C)  TempSrc: Oral Oral Oral Oral  SpO2: 100% 100% 100% 97%  Weight:      Height:        Intake/Output Summary (Last 24 hours) at 10/20/2020 1031 Last data filed  at 10/20/2020 1000 Gross per 24 hour  Intake 2752.78 ml  Output --  Net 2752.78 ml   Filed Weights   10/18/20 0547  Weight: 69.4 kg    Examination:  General exam: Overall comfortable, not in distress HEENT: PERRL Respiratory system:  no wheezes or crackles  Cardiovascular system: S1 & S2 heard, RRR.  Chemo-Port on the right chest Gastrointestinal system: Abdomen is nondistended, soft and nontender. Central nervous system: Alert and  oriented Extremities: No edema, no clubbing ,no cyanosis Skin: No rashes, no ulcers,no icterus      Data Reviewed: I have personally reviewed following labs and imaging studies  CBC: Recent Labs  Lab 10/18/20 0649 10/19/20 0413 10/20/20 0409  WBC 13.3* 13.4* 11.5*  NEUTROABS 9.4*  --  7.1  HGB 13.8 13.5 11.8*  HCT 40.1 40.8 36.2*  MCV 88.3 90.3 92.1  PLT 251 227 841   Basic Metabolic Panel: Recent Labs  Lab 10/18/20 0649 10/19/20 0413 10/20/20 0409  NA 135 142 136  K 3.3* 4.2 4.1  CL 99 109 107  CO2 25 25 24   GLUCOSE 119* 108* 108*  BUN 8 7 7   CREATININE 0.72 0.60* 0.59*  CALCIUM 8.6* 8.9 8.1*   GFR: Estimated Creatinine Clearance: 97.6 mL/min (A) (by C-G formula based on SCr of 0.59 mg/dL (L)). Liver Function Tests: Recent Labs  Lab 10/18/20 0649  AST 15  ALT 13  ALKPHOS 278*  BILITOT 0.6  PROT 7.1  ALBUMIN 4.3   Recent Labs  Lab 10/18/20 0649  LIPASE 18   No results for input(s): AMMONIA in the last 168 hours. Coagulation Profile: No results for input(s): INR, PROTIME in the last 168 hours. Cardiac Enzymes: No results for input(s): CKTOTAL, CKMB, CKMBINDEX, TROPONINI in the last 168 hours. BNP (last 3 results) No results for input(s): PROBNP in the last 8760 hours. HbA1C: No results for input(s): HGBA1C in the last 72 hours. CBG: No results for input(s): GLUCAP in the last 168 hours. Lipid Profile: No results for input(s): CHOL, HDL, LDLCALC, TRIG, CHOLHDL, LDLDIRECT in the last 72 hours. Thyroid Function Tests: No results for input(s): TSH, T4TOTAL, FREET4, T3FREE, THYROIDAB in the last 72 hours. Anemia Panel: No results for input(s): VITAMINB12, FOLATE, FERRITIN, TIBC, IRON, RETICCTPCT in the last 72 hours. Sepsis Labs: No results for input(s): PROCALCITON, LATICACIDVEN in the last 168 hours.  Recent Results (from the past 240 hour(s))  Resp Panel by RT-PCR (Flu A&B, Covid) Nasopharyngeal Swab     Status: None   Collection Time: 10/18/20   9:50 AM   Specimen: Nasopharyngeal Swab; Nasopharyngeal(NP) swabs in vial transport medium  Result Value Ref Range Status   SARS Coronavirus 2 by RT PCR NEGATIVE NEGATIVE Final    Comment: (NOTE) SARS-CoV-2 target nucleic acids are NOT DETECTED.  The SARS-CoV-2 RNA is generally detectable in upper respiratory specimens during the acute phase of infection. The lowest concentration of SARS-CoV-2 viral copies this assay can detect is 138 copies/mL. A negative result does not preclude SARS-Cov-2 infection and should not be used as the sole basis for treatment or other patient management decisions. A negative result may occur with  improper specimen collection/handling, submission of specimen other than nasopharyngeal swab, presence of viral mutation(s) within the areas targeted by this assay, and inadequate number of viral copies(<138 copies/mL). A negative result must be combined with clinical observations, patient history, and epidemiological information. The expected result is Negative.  Fact Sheet for Patients:  EntrepreneurPulse.com.au  Fact Sheet for Healthcare Providers:  IncredibleEmployment.be  This test is no t yet approved or cleared by the [place] and  has been authorized for detection and/or diagnosis of SARS-CoV-2 by FDA under an Emergency Use Authorization (EUA). This EUA will remain  in effect (meaning this test can be used) for the duration of the COVID-19 declaration under Section 564(b)(1) of the Act, 21 U.S.C.section 360bbb-3(b)(1), unless the authorization is terminated  or revoked sooner.       Influenza A by PCR NEGATIVE NEGATIVE Final   Influenza B by PCR NEGATIVE NEGATIVE Final    Comment: (NOTE) The Xpert Xpress SARS-CoV-2/FLU/RSV plus assay is intended as an aid in the diagnosis of influenza from Nasopharyngeal swab specimens and should not be used as a sole basis for treatment. Nasal washings and aspirates  are unacceptable for Xpert Xpress SARS-CoV-2/FLU/RSV testing.  Fact Sheet for Patients: EntrepreneurPulse.com.au  Fact Sheet for Healthcare Providers: IncredibleEmployment.be  This test is not yet approved or cleared by the [place] FDA and has been authorized for detection and/or diagnosis of SARS-CoV-2 by FDA under an Emergency Use Authorization (EUA). This EUA will remain in effect (meaning this test can be used) for the duration of the COVID-19 declaration under Section 564(b)(1) of the Act, 21 U.S.C. section 360bbb-3(b)(1), unless the authorization is terminated or revoked.  Performed at [facility], [address], [place], [place] [address]          Radiology Studies: No results found.      Scheduled Meds:  aspirin  81 mg Oral Daily   enoxaparin (LOVENOX) injection  40 mg Subcutaneous Q24H   famotidine  40 mg Oral BID   feeding supplement  1 Container Oral TID BM   fentaNYL  1 patch Transdermal Q72H   And   fentaNYL  1 patch Transdermal Q72H   gabapentin  800 mg Oral TID   metoprolol tartrate  12.5 mg Oral BID   multivitamin with minerals  1 tablet Oral Daily   pantoprazole (PROTONIX) IV  40 mg Intravenous QHS   polyethylene glycol  17 g Oral BID   predniSONE  5 mg Oral BID WC   senna  2 tablet Oral BID   tamsulosin  0.4 mg Oral Daily   Continuous Infusions:  sodium chloride 75 mL/hr at 10/20/20 0052     LOS: 1 day    Time spent: 35 mins.More than 50% of that time was spent in counseling and/or coordination of care.      Shelly Coss, MD [facility] P9/24/2022, 10:31 AM

## 2020-10-20 NOTE — Progress Notes (Signed)
Daily Progress Note   Patient Name: Ryan Davis       Date: 10/20/2020 DOB: Mar 16, 1960  Age: 60 y.o. MRN#: [id number] Attending Physician: Ryan Coss, MD Primary Care Physician: Ryan Davis Admit Date: 10/18/2020  Reason for Consultation/Follow-up: Establishing goals of care and Pain control  Subjective: I saw and examined Ryan Davis today.  He appears to be much more comfortable after reinitiation of fentanyl patches.  He tells me he is still having some pain, but is much better controlled than when he arrived to the hospital.  [facility] reveals that his use of rescue medication has dropped off significantly after being on patches for more than 12 hours.  He has had 1 rescue dose of 2 mg of IV Dilaudid early this morning but none since that time.  We discussed continuation of fentanyl for his long-acting agent and working to transition to oral medication in hopes of discharge tomorrow.  We discussed options of either going back to previous rescue medication of oxycodone or trialing a different short acting medication (I would recommend trying oral hydromorphone as he has done well with IV Dilaudid).  He reports wanting to go back to oxycodone.  We also discussed scheduling high-dose Tylenol 3 times daily.  He has not had a bowel movement other than a small 1 following enema.  He declined to consider another enema or suppository today.  We discussed continuing bowel regimen of senna 2 tabs twice daily as well as twice daily MiraLAX.  He has a follow-up appointment scheduled with Ryan Davis for Monday at 1 PM.  Length of Stay: 1  Current Medications: Scheduled Meds:   acetaminophen  1,000 mg Oral TID   aspirin  81 mg Oral Daily   enoxaparin (LOVENOX) injection  40 mg  Subcutaneous Q24H   famotidine  40 mg Oral BID   feeding supplement  1 Container Oral TID BM   fentaNYL  1 patch Transdermal Q72H   And   fentaNYL  1 patch Transdermal Q72H   gabapentin  800 mg Oral TID   metoprolol tartrate  12.5 mg Oral BID   multivitamin with minerals  1 tablet Oral Daily   pantoprazole (PROTONIX) IV  40 mg Intravenous QHS   polyethylene glycol  17 g Oral BID   predniSONE  5 mg  Oral BID WC   senna  2 tablet Oral BID   tamsulosin  0.4 mg Oral Daily    Continuous Infusions:  sodium chloride 75 mL/hr at 10/20/20 0052    PRN Meds: HYDROmorphone (DILAUDID) injection, lactulose, metoprolol tartrate, oxyCODONE  Physical Exam        General: Alert, awake, in no acute distress.   HEENT: No bruits, no goiter, no JVD Heart: Regular rate and rhythm. No murmur appreciated. Lungs: Good air movement, clear Abdomen: Soft, nontender, nondistended, positive bowel sounds.   Ext: No significant edema Skin: Warm and dry Neuro: Grossly intact, nonfocal.   Vital Signs: BP 98/68 (BP Location: Right Arm)   Pulse 74   Temp 98 F (36.7 C) (Oral)   Resp 18   Ht 6\' 2"  (1.88 m)   Wt 69.4 kg   SpO2 97%   BMI 19.64 kg/m  SpO2: SpO2: 97 % O2 Device: O2 Device: Room Air O2 Flow Rate:    Intake/output summary:  Intake/Output Summary (Last 24 hours) at 10/20/2020 1125 Last data filed at 10/20/2020 1000 Gross per 24 hour  Intake 2752.78 ml  Output --  Net 2752.78 ml   LBM: Last BM Date: 10/16/20 Baseline Weight: Weight: 69.4 kg Most recent weight: Weight: 69.4 kg       Palliative Assessment/Data:    Flowsheet Rows    Flowsheet Row Most Recent Value  Intake Tab   Referral Department Hospitalist  Unit at Time of Referral Med/Surg Unit  Palliative Care Primary Diagnosis Pain  Date Notified 10/18/20  Palliative Care Type New Palliative care  Reason for referral Pain, Clarify Goals of Care  Date of Admission 10/18/20  Date first seen by Palliative Care 10/19/20  #  of days Palliative referral response time 1 Day(s)  # of days IP prior to Palliative referral 0  Clinical Assessment   Palliative Performance Scale Score 30%  Psychosocial & Spiritual Assessment   Palliative Care Outcomes   Patient/Family meeting held? Yes  Who was at the meeting? Patient       Patient Active Problem List   Diagnosis Date Noted   Abdominal pain 10/18/2020   Goals of care, counseling/discussion 06/24/2017   Prostate cancer metastatic to multiple sites [facility]) 03/31/2016   Prostate cancer metastatic to bone ([place]) 03/31/2016   Atrial fibrillation ([place]) 03/30/2016   Back pain without radiculopathy 03/28/2016   Atrial fibrillation with RVR ([place]) 03/28/2016    Palliative Care Assessment & Plan   Patient Profile: 60 y.o. 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  He additionally had urinary hesitancy but this is resolved.  Work-up also revealed retained stool.  Palliative consulted for goals and pain management recommendation.  Recommendations/Plan: Pain, cancer related: Appears to be much improved since restarting his home regimen of fentanyl 200 mcg/h.  Discussed today need to ensure that he is utilizing prescribed amount of medication.  We discussed working to transition back to oral rescue medication, and we discussed options of continuing with oxycodone versus trialing oral hydromorphone.  He would like to stay with oxycodone at this time.  We will transition back to oxycodone 20 mg every 4 hours as needed.  I did also leave a backup dose of 1 mg of IV Dilaudid to be given 45 minutes after oral medication of oral medication is not sufficient to relieve his pain.  If this turns  out to be the case, I would recommend consider bumping oxycodone to 30 mg when he gets rescue doses this would actually be closer to 10% of his  total 24-hour opioid needs and would be close in equivalency to the 2 mg dose of IV Dilaudid that has been relieving his pain.  Additionally, I added on scheduled Tylenol at a dose of 1000 mg three times daily. Constipation, opioid related: Continue senna 2 tabs twice daily.  Continue MiraLAX twice daily.  He declined to consider enema or suppository today. Goals of care: He would like to discuss further with Ryan Davis.  He has a follow-up appointment scheduled for Monday.  Goals of Care and Additional Recommendations: Limitations on Scope of Treatment: Full Scope Treatment  Code Status:    Code Status Orders  (From admission, onward)           Start     Ordered   10/18/20 1416  Full code  Continuous        10/18/20 1418           Code Status History     Date Active Date Inactive Code Status Order ID Comments User Context   03/29/2016 0151 03/31/2016 1146 Full Code [id number]  Lily Kocher, MD Inpatient       Prognosis:  Unable to determine  Discharge Planning: Home with [place] [facility] was discussed with patient, RN  Thank you for allowing the Palliative Medicine Team to assist in the care of this patient.   Total Time 50 Prolonged Time Billed No      Greater than 50%  of this time was spent counseling and coordinating care related to the above assessment and plan.  Micheline Rough, MD  Please contact Palliative Medicine Team phone at [phone] for questions and concerns.

## 2020-10-21 MED ORDER — POLYETHYLENE GLYCOL 3350 17 G PO PACK: 17.0000 g | PACK | Freq: Two times a day (BID) | ORAL | 1 refills | Status: AC

## 2020-10-21 MED ORDER — SENNA 8.6 MG PO TABS: 2.0000 | ORAL_TABLET | Freq: Two times a day (BID) | ORAL | 0 refills | Status: AC

## 2020-10-21 MED ORDER — PANTOPRAZOLE SODIUM 40 MG PO TBEC: 40.0000 mg | DELAYED_RELEASE_TABLET | Freq: Every day | ORAL | 0 refills | Status: DC

## 2020-10-21 MED ORDER — TAMSULOSIN HCL 0.4 MG PO CAPS: 0.4000 mg | ORAL_CAPSULE | Freq: Every day | ORAL | 1 refills | Status: DC

## 2020-10-21 NOTE — Progress Notes (Signed)
Daily Progress Note   Patient Name: Ryan Davis       Date: 10/21/2020 DOB: [birth year]  Age: 60 y.o. MRN#: [id number] Attending Physician: No att. providers found Primary Care Physician: Rip Harbour Admit Date: 10/18/2020  Reason for Consultation/Follow-up: Establishing goals of care and Pain control  Subjective: I saw and examined Ryan Davis today.    He was lying in bed in no distress to my encounter.  We discussed his pain control overnight.  He does report continuing to have some breakthrough pain, however, he feels that he is doing well with home dose of oxycodone 20 mg as needed for breakthrough pain.  He has not had a bowel movement other than a small 1 following enema.  He declined to consider another enema or suppository today.  We discussed continuing bowel regimen of senna 2 tabs twice daily as well as twice daily MiraLAX.  He has a follow-up appointment scheduled with Dr. Marin Olp for Monday at 1 PM.  Length of Stay: 2  Current Medications: Scheduled Meds:   acetaminophen  1,000 mg Oral TID   aspirin  81 mg Oral Daily   enoxaparin (LOVENOX) injection  40 mg Subcutaneous Q24H   famotidine  40 mg Oral BID   feeding supplement  1 Container Oral TID BM   fentaNYL  1 patch Transdermal Q72H   And   fentaNYL  1 patch Transdermal Q72H   gabapentin  800 mg Oral TID   metoprolol tartrate  12.5 mg Oral BID   multivitamin with minerals  1 tablet Oral Daily   pantoprazole (PROTONIX) IV  40 mg Intravenous QHS   polyethylene glycol  17 g Oral BID   predniSONE  5 mg Oral BID WC   senna  2 tablet Oral BID   tamsulosin  0.4 mg Oral Daily    Continuous Infusions:  sodium chloride 75 mL/hr at 10/20/20 1301    PRN Meds: HYDROmorphone (DILAUDID) injection, lactulose,  metoprolol tartrate, oxyCODONE  Physical Exam        General: Alert, awake, in no acute distress.   HEENT: No bruits, no goiter, no JVD Heart: Regular rate and rhythm. No murmur appreciated. Lungs: Good air movement, clear Abdomen: Soft, nontender, nondistended, positive bowel sounds.   Ext: No significant edema Skin: Warm and dry Neuro: Grossly  intact, nonfocal.   Vital Signs: BP 100/64 (BP Location: Right Arm)   Pulse 84   Temp 98.3 F (36.8 C) (Oral)   Resp 16   Ht 6\' 2"  (1.88 m)   Wt 69.4 kg   SpO2 100%   BMI 19.64 kg/m  SpO2: SpO2: 100 % O2 Device: O2 Device: Room Air O2 Flow Rate:    Intake/output summary:  Intake/Output Summary (Last 24 hours) at 10/21/2020 1558 Last data filed at 10/21/2020 1000 Gross per 24 hour  Intake 1714.59 ml  Output 1 ml  Net 1713.59 ml    LBM: Last BM Date: 10/21/20 Baseline Weight: Weight: 69.4 kg Most recent weight: Weight: 69.4 kg       Palliative Assessment/Data:    Flowsheet Rows    Flowsheet Row Most Recent Value  Intake Tab   Referral Department Hospitalist  Unit at Time of Referral Med/Surg Unit  Palliative Care Primary Diagnosis Pain  Date Notified 10/18/20  Palliative Care Type New Palliative care  Reason for referral Pain, Clarify Goals of Care  Date of Admission 10/18/20  Date first seen by Palliative Care 10/19/20  # of days Palliative referral response time 1 Day(s)  # of days IP prior to Palliative referral 0  Clinical Assessment   Palliative Performance Scale Score 30%  Psychosocial & Spiritual Assessment   Palliative Care Outcomes   Patient/Family meeting held? Yes  Who was at the meeting? Patient       Patient Active Problem List   Diagnosis Date Noted   Abdominal pain 10/18/2020   Goals of care, counseling/discussion 06/24/2017   Prostate cancer metastatic to multiple sites [facility]) 03/31/2016   Prostate cancer metastatic to bone ([place]) 03/31/2016   Atrial fibrillation ([place]) 03/30/2016   Back pain  without radiculopathy 03/28/2016   Atrial fibrillation with RVR ([place]) 03/28/2016    Palliative Care Assessment & Plan   Patient Profile: 60 y.o. 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  He additionally had urinary hesitancy but this is resolved.  Work-up also revealed retained stool.  Palliative consulted for goals and pain management recommendation.  Recommendations/Plan: Pain, cancer related: Improved overall since restarting home regimen.  I believe the exacerbating factor for this pain crisis and admission was him forgetting to put on second fentanyl patch and also not changing patch in a timely manner.  Discussed with him the need to follow his outpatient regimen precisely in order to control his pain as best possible.  He is to follow-up with Dr. Marin Olp tomorrow at 1 PM. Constipation, opioid related: Continue senna 2 tabs twice daily.  Continue MiraLAX twice daily.  He declined to consider enema or suppository today. Goals of care: He would like to discuss further with Dr. Marin Olp.  He has a follow-up appointment scheduled for Monday.  Goals of Care and Additional Recommendations: Limitations on Scope of Treatment: Full Scope Treatment  Code Status:    Code Status Orders  (From admission, onward)           Start     Ordered   10/18/20 1416  Full code  Continuous        10/18/20 1418           Code Status History     Date Active Date Inactive Code Status Order ID Comments User Context   03/29/2016 0151 03/31/2016 1146 Full Code [id number]  Lily Kocher, MD Inpatient       Prognosis:  Unable to determine  Discharge Planning: Home with [place] was discussed with patient, RN  Thank you for allowing the Palliative Medicine Team to assist in the care of this patient.   Total Time 20  Prolonged Time Billed No   Greater than 50%  of this time was spent counseling and coordinating care related to the above assessment and plan.   Micheline Rough, MD  Please contact Palliative Medicine Team phone at [phone] for questions and concerns.

## 2020-10-21 NOTE — Discharge Summary (Signed)
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 fib, prior history of drug abuse who presented with abdominal, chest wall pain, vomiting.  Has history of diffuse bony metastasis.  On narcotics at home.  Also complained of constipation.  On presentation he was mildly febrile, hypertensive.  Lab work showed hypokalemia, elevated alkaline phosphatase.  EKG, troponins were normal.  CT abdomen/pelvis showed diffuse osseous metastatic disease throughout the chest, abdomen, pelvis, no pathological fracture identified.  Also showed moderate retained stool in the right colon.  Patient was admitted for the management of pain, constipation.  Palliative care also consulted for goals of care discussion.  He had been on aggressive bowel regimen and currently having bowel movements.  Pain is well controlled.  Patient is medically stable for discharge to home today.  He will follow-up with his PCP and oncologist.  Following problems were addressed during his hospitalization:   Diffuse pain/chronic pain syndrome: Due to malignancy, bony metastasis.  Chest pain was atypical.  EKG/troponin were unremarkable.  On fentanyl patch, oxycodone at home.  Resumed pain medications.  Continue PPI  ppx.   Abdomen/pelvis CT scan did not show any acute intra-abdominal findings.   Metastatic prostate cancer: Currently on chemo.  Follows with Dr. Marin Olp.  Status post radiation therapy for spinal metastasis.  Was also on prednisone for severe pain.  Palliative care consulted for goals of care.  Remains full code.   Paroxysmal A. fib: On NSR.On metoprolol for rate  control, aspirin   Constipation: No bowel movement for last several days.  Stool burden seen on the CT.  Finally had  bowel movements.Continue aggressive bowel regimen   Urinary hesitancy: In the setting of prostate cancer.  Started on Flomax   Hypokalemia: Supplemented and corrected   Leukocytosis: Most likely reactive and also from chronic prednisone use.  Continue to monitor   Decreased appetite:  Dietician was following   Discharge Diagnoses:  Principal Problem:   Abdominal pain Active Problems:   Back pain without radiculopathy   Atrial fibrillation (HCC)   Prostate cancer metastatic to multiple sites [facility])   Prostate cancer metastatic to bone [facility])    Discharge Instructions  Discharge Instructions     Diet general   Complete by: As directed    Discharge instructions   Complete by: As directed    1)Please take prescribed medications as instructed 2)Follow up with your PCP/oncologist   Increase activity slowly   Complete by: As directed       Allergies as of 10/21/2020       Reactions   Contrast Media [iodinated Diagnostic Agents] Nausea And Vomiting   Patient vomits immediately after IV contrast is injected just prior to start of scan, was unable to scan patient in correct time frame.  Patient did not warn me of this when dosing the patient with oral CM and asking the patient history questions until the patient was on the table for the CT scan just prior to injection.  Ct Tech:  Karl Bales 07/29/17    Other Nausea And Vomiting   Patient vomits immediately after IV contrast is injected just prior to start of scan, was unable to scan patient in correct  time frame.  Patient did not warn me of this when dosing the patient with oral CM and asking the patient history questions until the patient was on the table for the CT scan just prior to injection.  Ct Tech:  Karl Bales 07/29/17         Medication List     TAKE these medications    aspirin 81 MG chewable  tablet Chew 81 mg by mouth daily.   famotidine 40 MG tablet Commonly known as: PEPCID Take 1 tablet (40 mg total) by mouth 2 (two) times daily.   fentaNYL 100 MCG/HR Commonly known as: [place] 2 patches onto the skin every 3 (three) days.   gabapentin 800 MG tablet Commonly known as: NEURONTIN Take 800 mg by mouth 3 (three) times daily. May take an additional 400mg  as needed for pain   lidocaine-prilocaine cream Commonly known as: EMLA Apply to affected area once What changed:  how much to take how to take this when to take this reasons to take this additional instructions   metoprolol tartrate 25 MG tablet Commonly known as: LOPRESSOR TAKE 1/2 TABLET(12.5 MG) BY MOUTH TWICE DAILY What changed: See the new instructions.   Naloxone HCl 0.4 MG/0.4ML Soaj Use in the event of accidental overdose of opioids.   ondansetron 8 MG tablet Commonly known as: Zofran Take 1 tablet (8 mg total) by mouth 2 (two) times daily as needed for refractory nausea / vomiting.   Oxycodone HCl 20 MG Tabs Take 1 tablet (20 mg total) by mouth every 4 (four) hours as needed for severe pain.   polyethylene glycol 17 g packet Commonly known as: MIRALAX / GLYCOLAX Take 17 g by mouth 2 (two) times daily.   predniSONE 5 MG tablet Commonly known as: DELTASONE Take 1 tablet (5 mg total) by mouth in the morning and at bedtime.   prochlorperazine 10 MG tablet Commonly known as: COMPAZINE Take 1 tablet (10 mg total) by mouth every 6 (six) hours as needed (Nausea or vomiting).   senna 8.6 MG Tabs tablet Commonly known as: SENOKOT Take 2 tablets (17.2 mg total) by mouth 2 (two) times daily.   tamsulosin 0.4 MG Caps capsule Commonly known as: FLOMAX Take 1 capsule (0.4 mg total) by mouth daily.        Follow-up Information     Robert Bellow, PA-C. Schedule an appointment as soon as possible for a visit in 1 week(s).   Specialty: Internal Medicine Contact information: [phone] [place] [address] [phone]                Allergies  Allergen Reactions   Contrast Media [Iodinated Diagnostic Agents] Nausea And Vomiting    Patient vomits immediately after IV contrast is injected just prior to start of scan, was unable to scan patient in correct time frame.  Patient did not warn me of this when dosing the patient with oral CM and asking the patient history questions until the patient was on the table for the CT scan just prior to injection.  Ct Tech:  Karl Bales 07/29/17    Other Nausea And Vomiting    Patient vomits immediately after IV contrast is injected just prior to start of scan, was unable to scan patient in correct time frame.  Patient did not warn me of this when dosing the patient with oral CM and asking the patient history questions until the patient was on the table for the CT  scan just prior to injection.  Ct Tech:  Karl Bales 07/29/17     Consultations: Palliative care   Procedures/Studies: CT CHEST ABDOMEN PELVIS WO CONTRAST  Result Date: 10/18/2020 CLINICAL DATA:  60 year old male with metastatic prostate cancer. Undergoing chemotherapy. Acute abdominal and back pain. Vomiting. EXAM: CT CHEST, ABDOMEN AND PELVIS WITHOUT CONTRAST TECHNIQUE: Multidetector CT imaging of the chest, abdomen and pelvis was performed following the standard protocol without IV contrast. COMPARISON:  [facility] CT Abdomen and Pelvis 09/26/2020. [place] 08/26/2020, PET-CT 08/14/2020 and earlier. FINDINGS: CT CHEST FINDINGS Cardiovascular: Right chest Port-A-Cath now in place. No cardiomegaly or pericardial effusion. Vascular patency is not evaluated in the absence of IV contrast. Mediastinum/Nodes: No mediastinal mass or lymphadenopathy is evident in the absence of IV contrast. Lungs/Pleura: Major airways are patent. Mild linear lower lobe scarring is stable, with less associated atelectasis  since July. Stable similar mild apical scarring. No pulmonary metastatic disease or pleural effusion. Musculoskeletal: Diffuse osseous metastatic disease, predominantly sclerotic lesions throughout the thorax including the bilateral ribs, sternum and scapulae. No acute pathologic fracture is identified. No noncontrast CT evidence of thoracic epidural tumor. CT ABDOMEN PELVIS FINDINGS Hepatobiliary: Stable and negative noncontrast liver and gallbladder. Pancreas: Roughly 3 cm cystic mass at the tail of the pancreas remains stable. And in July was compared back to 2018 and stable. Spleen: Stable, negative. Adrenals/Urinary Tract: Stable, negative. Stomach/Bowel: No dilated large or small bowel. Redundant sigmoid colon in the pelvis. Moderate retained stool has increased in the right colon since last month. The cecum is partially located in the pelvis. Appendix not identified. No pericecal inflammation. No dilated small bowel. Decompressed stomach and duodenum. No free air, free fluid or mesenteric inflammation identified. Vascular/Lymphatic: Normal caliber abdominal aorta. Minimal calcified atherosclerosis. Vascular patency is not evaluated in the absence of IV contrast. No lymphadenopathy identified. Reproductive: Stable diminutive prostate. Other: No pelvic free fluid. Musculoskeletal: Diffuse osseous metastatic disease throughout the spine, pelvis and LEs affecting the proximal femurs. No pathologic fracture identified. No noncontrast CT evidence of lumbar epidural tumor. IMPRESSION: 1. Diffuse osseous metastatic disease throughout the chest, abdomen, and pelvis. No pathologic fracture identified. No obvious extra-osseous tumor extension. 2. No other acute or metastatic process identified on this noncontrast exam. Moderate retained stool in the right colon has increased since last month. Electronically Signed   By: Genevie Ann M.D.   On: 10/18/2020 07:09      Subjective:  Patient seen and examined at the bedside  this morning.  Hemodynamically stable for discharge today.  Discharge Exam: Vitals:   10/20/20 2055 10/21/20 0614  BP: 106/75 100/64  Pulse: 79 84  Resp: 16 16  Temp: 98.3 F (36.8 C) 98.3 F (36.8 C)  SpO2: 99% 100%   Vitals:   10/20/20 0621 10/20/20 1419 10/20/20 2055 10/21/20 0614  BP: 98/68 108/74 106/75 100/64  Pulse: 74 89 79 84  Resp: 18 18 16 16   Temp: 98 F (36.7 C) 98.5 F (36.9 C) 98.3 F (36.8 C) 98.3 F (36.8 C)  TempSrc: Oral Oral Oral Oral  SpO2: 97% 98% 99% 100%  Weight:      Height:        General: Pt is alert, awake, not in acute distress Cardiovascular: RRR, S1/S2 +, no rubs, no gallops Respiratory: CTA bilaterally, no wheezing, no rhonchi Abdominal: Soft, NT, ND, bowel sounds + Extremities: no edema, no cyanosis    The results of significant diagnostics from this hospitalization (  including imaging, microbiology, ancillary and laboratory) are listed below for reference.     Microbiology: Recent Results (from the past 240 hour(s))  Resp Panel by RT-PCR (Flu A&B, Covid) Nasopharyngeal Swab     Status: None   Collection Time: 10/18/20  9:50 AM   Specimen: Nasopharyngeal Swab; Nasopharyngeal(NP) swabs in vial transport medium  Result Value Ref Range Status   SARS Coronavirus 2 by RT PCR NEGATIVE NEGATIVE Final    Comment: (NOTE) SARS-CoV-2 target nucleic acids are NOT DETECTED.  The SARS-CoV-2 RNA is generally detectable in upper respiratory specimens during the acute phase of infection. The lowest concentration of SARS-CoV-2 viral copies this assay can detect is 138 copies/mL. A negative result does not preclude SARS-Cov-2 infection and should not be used as the sole basis for treatment or other patient management decisions. A negative result may occur with  improper specimen collection/handling, submission of specimen other than nasopharyngeal swab, presence of viral mutation(s) within the areas targeted by this assay, and inadequate number  of viral copies(<138 copies/mL). A negative result must be combined with clinical observations, patient history, and epidemiological information. The expected result is Negative.  Fact Sheet for Patients:  EntrepreneurPulse.com.au  Fact Sheet for Healthcare Providers:  IncredibleEmployment.be  This test is no t yet approved or cleared by the [place] FDA and  has been authorized for detection and/or diagnosis of SARS-CoV-2 by FDA under an Emergency Use Authorization (EUA). This EUA will remain  in effect (meaning this test can be used) for the duration of the COVID-19 declaration under Section 564(b)(1) of the Act, 21 U.S.C.section 360bbb-3(b)(1), unless the authorization is terminated  or revoked sooner.       Influenza A by PCR NEGATIVE NEGATIVE Final   Influenza B by PCR NEGATIVE NEGATIVE Final    Comment: (NOTE) The Xpert Xpress SARS-CoV-2/FLU/RSV plus assay is intended as an aid in the diagnosis of influenza from Nasopharyngeal swab specimens and should not be used as a sole basis for treatment. Nasal washings and aspirates are unacceptable for Xpert Xpress SARS-CoV-2/FLU/RSV testing.  Fact Sheet for Patients: EntrepreneurPulse.com.au  Fact Sheet for Healthcare Providers: IncredibleEmployment.be  This test is not yet approved or cleared by the [place] FDA and has been authorized for detection and/or diagnosis of SARS-CoV-2 by FDA under an Emergency Use Authorization (EUA). This EUA will remain in effect (meaning this test can be used) for the duration of the COVID-19 declaration under Section 564(b)(1) of the Act, 21 U.S.C. section 360bbb-3(b)(1), unless the authorization is terminated or revoked.  Performed at [facility], [address] [place], [place] [address]      Labs: BNP (last 3 results) No results for input(s): BNP in the last 8760 hours. Basic Metabolic  Panel: Recent Labs  Lab 10/18/20 0649 10/19/20 0413 10/20/20 0409  NA 135 142 136  K 3.3* 4.2 4.1  CL 99 109 107  CO2 25 25 24   GLUCOSE 119* 108* 108*  BUN 8 7 7   CREATININE 0.72 0.60* 0.59*  CALCIUM 8.6* 8.9 8.1*   Liver Function Tests: Recent Labs  Lab 10/18/20 0649  AST 15  ALT 13  ALKPHOS 278*  BILITOT 0.6  PROT 7.1  ALBUMIN 4.3   Recent Labs  Lab 10/18/20 0649  LIPASE 18   No results for input(s): AMMONIA in the last 168 hours. CBC: Recent Labs  Lab 10/18/20 0649 10/19/20 0413 10/20/20 0409  WBC 13.3* 13.4* 11.5*  NEUTROABS 9.4*  --  7.1  HGB  13.8 13.5 11.8*  HCT 40.1 40.8 36.2*  MCV 88.3 90.3 92.1  PLT 251 227 173   Cardiac Enzymes: No results for input(s): CKTOTAL, CKMB, CKMBINDEX, TROPONINI in the last 168 hours. BNP: Invalid input(s): POCBNP CBG: No results for input(s): GLUCAP in the last 168 hours. D-Dimer No results for input(s): DDIMER in the last 72 hours. Hgb A1c No results for input(s): HGBA1C in the last 72 hours. Lipid Profile No results for input(s): CHOL, HDL, LDLCALC, TRIG, CHOLHDL, LDLDIRECT in the last 72 hours. Thyroid function studies No results for input(s): TSH, T4TOTAL, T3FREE, THYROIDAB in the last 72 hours.  Invalid input(s): FREET3 Anemia work up No results for input(s): VITAMINB12, FOLATE, FERRITIN, TIBC, IRON, RETICCTPCT in the last 72 hours. Urinalysis    Component Value Date/Time   COLORURINE YELLOW 08/26/2020 1348   APPEARANCEUR CLEAR 08/26/2020 1348   LABSPEC 1.015 08/26/2020 1348   PHURINE 6.5 08/26/2020 1348   GLUCOSEU NEGATIVE 08/26/2020 1348   HGBUR LARGE (A) 08/26/2020 1348   BILIRUBINUR NEGATIVE 08/26/2020 1348   KETONESUR NEGATIVE 08/26/2020 1348   PROTEINUR NEGATIVE 08/26/2020 1348   NITRITE NEGATIVE 08/26/2020 1348   LEUKOCYTESUR TRACE (A) 08/26/2020 1348   Sepsis Labs Invalid input(s): PROCALCITONIN,  WBC,  LACTICIDVEN Microbiology Recent Results (from the past 240 hour(s))  Resp Panel by  RT-PCR (Flu A&B, Covid) Nasopharyngeal Swab     Status: None   Collection Time: 10/18/20  9:50 AM   Specimen: Nasopharyngeal Swab; Nasopharyngeal(NP) swabs in vial transport medium  Result Value Ref Range Status   SARS Coronavirus 2 by RT PCR NEGATIVE NEGATIVE Final    Comment: (NOTE) SARS-CoV-2 target nucleic acids are NOT DETECTED.  The SARS-CoV-2 RNA is generally detectable in upper respiratory specimens during the acute phase of infection. The lowest concentration of SARS-CoV-2 viral copies this assay can detect is 138 copies/mL. A negative result does not preclude SARS-Cov-2 infection and should not be used as the sole basis for treatment or other patient management decisions. A negative result may occur with  improper specimen collection/handling, submission of specimen other than nasopharyngeal swab, presence of viral mutation(s) within the areas targeted by this assay, and inadequate number of viral copies(<138 copies/mL). A negative result must be combined with clinical observations, patient history, and epidemiological information. The expected result is Negative.  Fact Sheet for Patients:  EntrepreneurPulse.com.au  Fact Sheet for Healthcare Providers:  IncredibleEmployment.be  This test is no t yet approved or cleared by the [place] FDA and  has been authorized for detection and/or diagnosis of SARS-CoV-2 by FDA under an Emergency Use Authorization (EUA). This EUA will remain  in effect (meaning this test can be used) for the duration of the COVID-19 declaration under Section 564(b)(1) of the Act, 21 U.S.C.section 360bbb-3(b)(1), unless the authorization is terminated  or revoked sooner.       Influenza A by PCR NEGATIVE NEGATIVE Final   Influenza B by PCR NEGATIVE NEGATIVE Final    Comment: (NOTE) The Xpert Xpress SARS-CoV-2/FLU/RSV plus assay is intended as an aid in the diagnosis of influenza from Nasopharyngeal swab  specimens and should not be used as a sole basis for treatment. Nasal washings and aspirates are unacceptable for Xpert Xpress SARS-CoV-2/FLU/RSV testing.  Fact Sheet for Patients: EntrepreneurPulse.com.au  Fact Sheet for Healthcare Providers: IncredibleEmployment.be  This test is not yet approved or cleared by the [place] FDA and has been authorized for detection and/or diagnosis of SARS-CoV-2 by FDA under an Emergency Use Authorization (EUA). This EUA  will remain in effect (meaning this test can be used) for the duration of the COVID-19 declaration under Section 564(b)(1) of the Act, 21 U.S.C. section 360bbb-3(b)(1), unless the authorization is terminated or revoked.  Performed at [facility], [address], [place], [place] [address]     Please note: You were cared for by a hospitalist during your hospital stay. Once you are discharged, your primary care physician will handle any further medical issues. Please note that NO REFILLS for any discharge medications will be authorized once you are discharged, as it is imperative that you return to your primary care physician (or establish a relationship with a primary care physician if you do not have one) for your post hospital discharge needs so that they can reassess your need for medications and monitor your lab values.    Time coordinating discharge: 40 minutes  SIGNED:   Shelly Coss, MD  [facility] 10/21/2020, 9:35 AM Pager [phone]  If 7PM-7AM, please contact night-coverage [email or web address] Password [email or web address]

## 2020-10-22 ENCOUNTER — Other Ambulatory Visit: Payer: Self-pay

## 2020-10-22 ENCOUNTER — Inpatient Hospital Stay: Payer: Medicare Other

## 2020-10-22 ENCOUNTER — Encounter: Payer: Self-pay | Admitting: Hematology & Oncology

## 2020-10-22 ENCOUNTER — Inpatient Hospital Stay (HOSPITAL_BASED_OUTPATIENT_CLINIC_OR_DEPARTMENT_OTHER): Payer: Medicare Other | Admitting: Hematology & Oncology

## 2020-10-22 VITALS — BP 143/92 | HR 73 | Temp 98.2°F | Resp 18 | Ht 74.02 in | Wt 153.4 lb

## 2020-10-22 DIAGNOSIS — C7951 Secondary malignant neoplasm of bone: Secondary | ICD-10-CM

## 2020-10-22 DIAGNOSIS — C61 Malignant neoplasm of prostate: Secondary | ICD-10-CM

## 2020-10-22 DIAGNOSIS — Z5111 Encounter for antineoplastic chemotherapy: Secondary | ICD-10-CM | POA: Diagnosis present

## 2020-10-22 LAB — CBC WITH DIFFERENTIAL (CANCER CENTER ONLY)
Abs Immature Granulocytes: 0.09 10*3/uL — ABNORMAL HIGH (ref 0.00–0.07)
Basophils Absolute: 0 10*3/uL (ref 0.0–0.1)
Basophils Relative: 0 %
Eosinophils Absolute: 0.1 10*3/uL (ref 0.0–0.5)
Eosinophils Relative: 1 %
HCT: 36.7 % — ABNORMAL LOW (ref 39.0–52.0)
Hemoglobin: 12.4 g/dL — ABNORMAL LOW (ref 13.0–17.0)
Immature Granulocytes: 1 %
Lymphocytes Relative: 10 %
Lymphs Abs: 1.2 10*3/uL (ref 0.7–4.0)
MCH: 30.7 pg (ref 26.0–34.0)
MCHC: 33.8 g/dL (ref 30.0–36.0)
MCV: 90.8 fL (ref 80.0–100.0)
Monocytes Absolute: 0.5 10*3/uL (ref 0.1–1.0)
Monocytes Relative: 4 %
Neutro Abs: 10.7 10*3/uL — ABNORMAL HIGH (ref 1.7–7.7)
Neutrophils Relative %: 84 %
Platelet Count: 152 10*3/uL (ref 150–400)
RBC: 4.04 MIL/uL — ABNORMAL LOW (ref 4.22–5.81)
RDW: 15 % (ref 11.5–15.5)
WBC Count: 12.6 10*3/uL — ABNORMAL HIGH (ref 4.0–10.5)
nRBC: 0 % (ref 0.0–0.2)

## 2020-10-22 LAB — CMP (CANCER CENTER ONLY)
ALT: 12 U/L (ref 0–44)
AST: 17 U/L (ref 15–41)
Albumin: 4.1 g/dL (ref 3.5–5.0)
Alkaline Phosphatase: 215 U/L — ABNORMAL HIGH (ref 38–126)
Anion gap: 9 (ref 5–15)
BUN: 6 mg/dL (ref 6–20)
CO2: 27 mmol/L (ref 22–32)
Calcium: 8.6 mg/dL — ABNORMAL LOW (ref 8.9–10.3)
Chloride: 104 mmol/L (ref 98–111)
Creatinine: 0.68 mg/dL (ref 0.61–1.24)
GFR, Estimated: >60 mL/min (ref >60)
Glucose, Bld: 138 mg/dL — ABNORMAL HIGH (ref 70–99)
Potassium: 3.9 mmol/L (ref 3.5–5.1)
Sodium: 140 mmol/L (ref 135–145)
Total Bilirubin: 0.4 mg/dL (ref 0.3–1.2)
Total Protein: 6.3 g/dL — ABNORMAL LOW (ref 6.5–8.1)

## 2020-10-22 MED ORDER — ZOLEDRONIC ACID 4 MG/100ML IV SOLN
4.0000 mg | Freq: Once | INTRAVENOUS | Status: DC
  Filled 2020-10-22: qty 100

## 2020-10-22 MED ORDER — SODIUM CHLORIDE 0.9 % IV SOLN: Freq: Once | INTRAVENOUS | Status: AC

## 2020-10-22 MED ORDER — SODIUM CHLORIDE 0.9% FLUSH
10.0000 mL | INTRAVENOUS | Status: DC | PRN
  Administered 2020-10-22: 10 mL

## 2020-10-22 MED ORDER — HYDROMORPHONE HCL 1 MG/ML IJ SOLN
1.0000 mg | Freq: Once | INTRAMUSCULAR | Status: AC
Start: 2020-10-22 — End: 2020-10-22
  Administered 2020-10-22: 1 mg via INTRAVENOUS
  Filled 2020-10-22: qty 1

## 2020-10-22 MED ORDER — LEUPROLIDE ACETATE (3 MONTH) 22.5 MG ~~LOC~~ KIT
22.5000 mg | PACK | Freq: Once | SUBCUTANEOUS | Status: AC
  Administered 2020-10-22: 22.5 mg via SUBCUTANEOUS
  Filled 2020-10-22: qty 22.5

## 2020-10-22 MED ORDER — SODIUM CHLORIDE 0.9 % IV SOLN
67.5000 mg/m2 | Freq: Once | INTRAVENOUS | Status: AC
  Administered 2020-10-22: 130 mg via INTRAVENOUS
  Filled 2020-10-22: qty 13

## 2020-10-22 MED ORDER — SODIUM CHLORIDE 0.9 % IV SOLN
10.0000 mg | Freq: Once | INTRAVENOUS | Status: AC
  Administered 2020-10-22: 10 mg via INTRAVENOUS
  Filled 2020-10-22: qty 10

## 2020-10-22 MED ORDER — HEPARIN SOD (PORK) LOCK FLUSH 100 UNIT/ML IV SOLN
500.0000 [IU] | Freq: Once | INTRAVENOUS | Status: AC | PRN
  Administered 2020-10-22: 500 [IU]

## 2020-10-22 NOTE — Patient Instructions (Signed)
Implanted Port Home Guide An implanted port is a device that is placed under the skin. It is usually placed in the chest. The device can be used to give IV medicine, to take blood, or for dialysis. You may have an implanted port if: You need IV medicine that would be irritating to the small veins in your hands or arms. You need IV medicines, such as antibiotics, for a long period of time. You need IV nutrition for a long period of time. You need dialysis. When you have a port, your health care provider can choose to use the port instead of veins in your arms for these procedures. You may have fewer limitations when using a port than you would if you used other types of long-term IVs, and you will likely be able to return to normal activities after your incision heals. An implanted port has two main parts: Reservoir. The reservoir is the part where a needle is inserted to give medicines or draw blood. The reservoir is round. After it is placed, it appears as a small, raised area under your skin. Catheter. The catheter is a thin, flexible tube that connects the reservoir to a vein. Medicine that is inserted into the reservoir goes into the catheter and then into the vein. How is my port accessed? To access your port: A numbing cream may be placed on the skin over the port site. Your health care provider will put on a mask and sterile gloves. The skin over your port will be cleaned carefully with a germ-killing soap and allowed to dry. Your health care provider will gently pinch the port and insert a needle into it. Your health care provider will check for a blood return to make sure the port is in the vein and is not clogged. If your port needs to remain accessed to get medicine continuously (constant infusion), your health care provider will place a clear bandage (dressing) over the needle site. The dressing and needle will need to be changed every week, or as told by your health care provider. What  is flushing? Flushing helps keep the port from getting clogged. Follow instructions from your health care provider about how and when to flush the port. Ports are usually flushed with saline solution or a medicine called heparin. The need for flushing will depend on how the port is used: If the port is only used from time to time to give medicines or draw blood, the port may need to be flushed: Before and after medicines have been given. Before and after blood has been drawn. As part of routine maintenance. Flushing may be recommended every 4-6 weeks. If a constant infusion is running, the port may not need to be flushed. Throw away any syringes in a disposal container that is meant for sharp items (sharps container). You can buy a sharps container from a pharmacy, or you can make one by using an empty hard plastic bottle with a cover. How long will my port stay implanted? The port can stay in for as long as your health care provider thinks it is needed. When it is time for the port to come out, a surgery will be done to remove it. The surgery will be similar to the procedure that was done to put the port in. Follow these instructions at home:  Flush your port as told by your health care provider. If you need an infusion over several days, follow instructions from your health care provider about how   to take care of your port site. Make sure you: Wash your hands with soap and water before you change your dressing. If soap and water are not available, use alcohol-based hand sanitizer. Change your dressing as told by your health care provider. Place any used dressings or infusion bags into a plastic bag. Throw that bag in the trash. Keep the dressing that covers the needle clean and dry. Do not get it wet. Do not use scissors or sharp objects near the tube. Keep the tube clamped, unless it is being used. Check your port site every day for signs of infection. Check for: Redness, swelling, or  pain. Fluid or blood. Pus or a bad smell. Protect the skin around the port site. Avoid wearing bra straps that rub or irritate the site. Protect the skin around your port from seat belts. Place a soft pad over your chest if needed. Bathe or shower as told by your health care provider. The site may get wet as long as you are not actively receiving an infusion. Return to your normal activities as told by your health care provider. Ask your health care provider what activities are safe for you. Carry a medical alert card or wear a medical alert bracelet at all times. This will let health care providers know that you have an implanted port in case of an emergency. Get help right away if: You have redness, swelling, or pain at the port site. You have fluid or blood coming from your port site. You have pus or a bad smell coming from the port site. You have a fever. Summary Implanted ports are usually placed in the chest for long-term IV access. Follow instructions from your health care provider about flushing the port and changing bandages (dressings). Take care of the area around your port by avoiding clothing that puts pressure on the area, and by watching for signs of infection. Protect the skin around your port from seat belts. Place a soft pad over your chest if needed. Get help right away if you have a fever or you have redness, swelling, pain, drainage, or a bad smell at the port site. This information is not intended to replace advice given to you by your health care provider. Make sure you discuss any questions you have with your health care provider. Document Revised: 04/04/2020 Document Reviewed: 05/30/2019 Elsevier Patient Education  2022 Elsevier Inc.  

## 2020-10-22 NOTE — Progress Notes (Signed)
Hematology and Oncology Follow Up Visit  Ryan Davis [id number] 1960/06/28 60 y.o. 10/22/2020   Principle Diagnosis:  Metastatic prostate cancer - androgen resistant    Past Therapy: 14 fractions of radiation to the lumbar spine Casodex 50 mg by mouth daily - stopped 04/02/2017 due to progression  Zytiga 1000 mg by mouth daily/prednisone 10 mg by mouth daily - started on 04/20/2016 - d/c on 04/02/2017 Xtandi 160 mg po q day - started on 04/06/2017 -- d/c on 06/24/2017 Flutamide 250 mg po q8hr - started 07/08/2017 -- d/c on 04/23/18   Current Therapy:        Lupron 11.5 mg IM every 3 month - next dose 09/2020  Zometa 4 mg IV q 3 months - next dose 09/2020 Apalutamide 240 mg po q day -- started on 04/28/2018 Taxotere --  s/p cycle #2--  start on 09/05/2020   Interim History:  Ryan Davis is here today for follow-up.  Unfortunately, he was in the hospital recently.  He came in because of abdominal pain and back pain.  He gets constipated.  I am not sure he really has take anything at home for this.  He was given some MiraLAX and had some enemas in the hospital.  I told him to take MiraLAX twice a day.  If this does not work, then we will likely need to use lactulose.  His last PSA was 70.  This is come down a little bit.  We will have to see that it will hopefully come down further.  He had a CT scan done in the hospital.  This was done on 10/18/2020.  This showed no evidence of visceral organ involvement.  He had diffuse osseous metastatic disease.  No pathologic fractures were noted.  He says his pain is doing okay.  He is has some chronic back issues.  He has had no cough or shortness of breath.  He has had no rashes.  Says appetite is doing pretty well.  There has been no leg swelling.   He has had no fever.  There has been no double vision or blurred vision.  He does had a little bit of unsteadiness.  Overall, his performance status is probably ECOG 1.   .    Medications:   Allergies as of 10/22/2020       Reactions   Contrast Media [iodinated Diagnostic Agents] Nausea And Vomiting   Patient vomits immediately after IV contrast is injected just prior to start of scan, was unable to scan patient in correct time frame.  Patient did not warn me of this when dosing the patient with oral CM and asking the patient history questions until the patient was on the table for the CT scan just prior to injection.  Ct Tech:  Karl Bales 07/29/17    Other Nausea And Vomiting   Patient vomits immediately after IV contrast is injected just prior to start of scan, was unable to scan patient in correct time frame.  Patient did not warn me of this when dosing the patient with oral CM and asking the patient history questions until the patient was on the table for the CT scan just prior to injection.  Ct Tech:  Karl Bales 07/29/17         Medication List        Accurate as of October 22, 2020  2:04 PM. If you have any questions, ask your nurse or doctor.  aspirin 81 MG chewable tablet Chew 81 mg by mouth daily.   famotidine 40 MG tablet Commonly known as: PEPCID Take 1 tablet (40 mg total) by mouth 2 (two) times daily.   fentaNYL 100 MCG/HR Commonly known as: [place] 2 patches onto the skin every 3 (three) days.   gabapentin 800 MG tablet Commonly known as: NEURONTIN Take 800 mg by mouth 3 (three) times daily. May take an additional 400mg  as needed for pain   lidocaine-prilocaine cream Commonly known as: EMLA Apply to affected area once What changed:  how much to take how to take this when to take this reasons to take this additional instructions   metoprolol tartrate 25 MG tablet Commonly known as: LOPRESSOR TAKE 1/2 TABLET(12.5 MG) BY MOUTH TWICE DAILY What changed: See the new instructions.   Naloxone HCl 0.4 MG/0.4ML Soaj Use in the event of accidental overdose of opioids.   ondansetron 8 MG tablet Commonly known as:  Zofran Take 1 tablet (8 mg total) by mouth 2 (two) times daily as needed for refractory nausea / vomiting.   Oxycodone HCl 20 MG Tabs Take 1 tablet (20 mg total) by mouth every 4 (four) hours as needed for severe pain.   pantoprazole 40 MG tablet Commonly known as: Protonix Take 1 tablet (40 mg total) by mouth daily.   polyethylene glycol 17 g packet Commonly known as: MIRALAX / GLYCOLAX Take 17 g by mouth 2 (two) times daily.   predniSONE 5 MG tablet Commonly known as: DELTASONE Take 1 tablet (5 mg total) by mouth in the morning and at bedtime.   prochlorperazine 10 MG tablet Commonly known as: COMPAZINE Take 1 tablet (10 mg total) by mouth every 6 (six) hours as needed (Nausea or vomiting).   senna 8.6 MG Tabs tablet Commonly known as: SENOKOT Take 2 tablets (17.2 mg total) by mouth 2 (two) times daily.   tamsulosin 0.4 MG Caps capsule Commonly known as: FLOMAX Take 1 capsule (0.4 mg total) by mouth daily.        Allergies:  Allergies  Allergen Reactions   Contrast Media [Iodinated Diagnostic Agents] Nausea And Vomiting    Patient vomits immediately after IV contrast is injected just prior to start of scan, was unable to scan patient in correct time frame.  Patient did not warn me of this when dosing the patient with oral CM and asking the patient history questions until the patient was on the table for the CT scan just prior to injection.  Ct Tech:  Karl Bales 07/29/17    Other Nausea And Vomiting    Patient vomits immediately after IV contrast is injected just prior to start of scan, was unable to scan patient in correct time frame.  Patient did not warn me of this when dosing the patient with oral CM and asking the patient history questions until the patient was on the table for the CT scan just prior to injection.  Ct Tech:  Karl Bales 07/29/17     Past Medical History, Surgical history, Social history, and Family History were reviewed and updated.  Review of  Systems: Review of Systems  Constitutional: Negative.   HENT: Negative.    Eyes: Negative.   Respiratory: Negative.    Cardiovascular: Negative.   Gastrointestinal: Negative.   Genitourinary: Negative.   Musculoskeletal:  Positive for back pain, joint pain and neck pain.  Skin: Negative.   Neurological: Negative.   Endo/Heme/Allergies: Negative.   Psychiatric/Behavioral: Negative.  Physical Exam:  height is 6' 2.02" (1.88 m) and weight is 153 lb 6.4 oz (69.6 kg). His oral temperature is 98.2 F (36.8 C). His blood pressure is 143/92 (abnormal) and his pulse is 73. His respiration is 18 and oxygen saturation is 100%.   Wt Readings from Last 3 Encounters:  10/22/20 153 lb 6.4 oz (69.6 kg)  10/18/20 153 lb (69.4 kg)  09/27/20 153 lb 1.9 oz (69.5 kg)    Physical Exam Vitals reviewed.  HENT:     Head: Normocephalic and atraumatic.  Eyes:     Pupils: Pupils are equal, round, and reactive to light.  Cardiovascular:     Rate and Rhythm: Normal rate and regular rhythm.     Heart sounds: Normal heart sounds.  Pulmonary:     Effort: Pulmonary effort is normal.     Breath sounds: Normal breath sounds.  Abdominal:     General: Bowel sounds are normal.     Palpations: Abdomen is soft.  Musculoskeletal:        General: No tenderness or deformity. Normal range of motion.     Cervical back: Normal range of motion.  Lymphadenopathy:     Cervical: No cervical adenopathy.  Skin:    General: Skin is warm and dry.     Findings: No erythema or rash.  Neurological:     Mental Status: He is alert and oriented to person, place, and time.  Psychiatric:        Behavior: Behavior normal.        Thought Content: Thought content normal.        Judgment: Judgment normal.    Lab Results  Component Value Date   WBC 12.6 (H) 10/22/2020   HGB 12.4 (L) 10/22/2020   HCT 36.7 (L) 10/22/2020   MCV 90.8 10/22/2020   PLT 152 10/22/2020   Lab Results  Component Value Date   FERRITIN 216  11/04/2017   IRON 77 11/04/2017   TIBC 261 11/04/2017   UIBC 184 11/04/2017   IRONPCTSAT 30 (L) 11/04/2017   Lab Results  Component Value Date   RBC 4.04 (L) 10/22/2020   Lab Results  Component Value Date   KAPLAMBRATIO 0.89 03/28/2016   Lab Results  Component Value Date   IGGSERUM 1,066 03/28/2016   IGMSERUM 106 03/28/2016   No results found for: Odetta Pink, SPEI   Chemistry      Component Value Date/Time   NA 136 10/20/2020 0409   NA 143 12/16/2016 1022   K 4.1 10/20/2020 0409   K 3.5 12/16/2016 1022   CL 107 10/20/2020 0409   CL 101 12/16/2016 1022   CO2 24 10/20/2020 0409   CO2 33 12/16/2016 1022   BUN 7 10/20/2020 0409   BUN 4 (L) 12/16/2016 1022   CREATININE 0.59 (L) 10/20/2020 0409   CREATININE 0.76 09/06/2020 1053   CREATININE 0.8 12/16/2016 1022      Component Value Date/Time   CALCIUM 8.1 (L) 10/20/2020 0409   CALCIUM 9.4 12/16/2016 1022   ALKPHOS 278 (H) 10/18/2020 0649   ALKPHOS 77 12/16/2016 1022   AST 15 10/18/2020 0649   AST 13 (L) 09/06/2020 1053   ALT 13 10/18/2020 0649   ALT 14 09/06/2020 1053   ALT 16 12/16/2016 1022   BILITOT 0.6 10/18/2020 0649   BILITOT 0.4 09/06/2020 1053       Impression and Plan: Mr. Sanchez is a very pleasant 60 yo African American gentleman  with metastatic prostate cancer.   We will see what his PSA level is.  Hopefully will be down significantly.  We will stop the prednisone.  I will know if this is causing any of the abdominal discomfort.  He is on Pepcid.  I hope he is taking this twice a day.  Again, we have to stop him from getting constipated.  If MiraLAX does not work, we will going to have to think about trying lactulose.  I really think that the most cycles that he will tolerate of Taxotere will be 6.  We will try for his third cycle today.  I will plan to get him back in another 3 weeks or so.   [name], MD 9/26/20222:04 PM

## 2020-10-22 NOTE — Progress Notes (Signed)
Hold Zometa today for Ca 8.6 per Dr. Marin Olp.

## 2020-10-23 ENCOUNTER — Telehealth: Payer: Self-pay

## 2020-10-23 LAB — PSA, TOTAL AND FREE
PSA, Free Pct: 22.2 %
PSA, Free: 28.7 ng/mL
Prostate Specific Ag, Serum: 129 ng/mL — ABNORMAL HIGH (ref 0.0–4.0)

## 2020-10-23 LAB — TESTOSTERONE: Testosterone: <3 ng/dL — ABNORMAL LOW (ref 264–916)

## 2020-10-23 NOTE — Telephone Encounter (Signed)
Pt called requesting he his PSA results came back,. Called patient and informed him results were not back yet and would call when they resulted. He confirmed and declines any other questions or concerns.

## 2020-10-29 ENCOUNTER — Other Ambulatory Visit: Payer: Self-pay | Admitting: *Deleted

## 2020-10-29 DIAGNOSIS — M5442 Lumbago with sciatica, left side: Secondary | ICD-10-CM

## 2020-10-29 DIAGNOSIS — C7951 Secondary malignant neoplasm of bone: Secondary | ICD-10-CM

## 2020-10-29 DIAGNOSIS — C61 Malignant neoplasm of prostate: Secondary | ICD-10-CM

## 2020-10-29 DIAGNOSIS — G8929 Other chronic pain: Secondary | ICD-10-CM

## 2020-10-29 MED ORDER — OXYCODONE HCL 20 MG PO TABS: 20.0000 mg | ORAL_TABLET | ORAL | 0 refills | Status: DC | PRN

## 2020-10-29 MED ORDER — FENTANYL 100 MCG/HR TD PT72: 2.0000 | MEDICATED_PATCH | TRANSDERMAL | 0 refills | Status: DC

## 2020-11-06 ENCOUNTER — Encounter: Payer: Self-pay | Admitting: *Deleted

## 2020-11-07 ENCOUNTER — Other Ambulatory Visit: Payer: Self-pay | Admitting: Hematology & Oncology

## 2020-11-07 DIAGNOSIS — C61 Malignant neoplasm of prostate: Secondary | ICD-10-CM

## 2020-11-12 ENCOUNTER — Inpatient Hospital Stay: Payer: Medicare Other

## 2020-11-12 ENCOUNTER — Encounter: Payer: Self-pay | Admitting: Hematology & Oncology

## 2020-11-12 ENCOUNTER — Other Ambulatory Visit: Payer: Self-pay

## 2020-11-12 ENCOUNTER — Inpatient Hospital Stay: Payer: Medicare Other | Attending: Hematology & Oncology

## 2020-11-12 ENCOUNTER — Inpatient Hospital Stay (HOSPITAL_BASED_OUTPATIENT_CLINIC_OR_DEPARTMENT_OTHER): Payer: Medicare Other | Admitting: Hematology & Oncology

## 2020-11-12 DIAGNOSIS — C61 Malignant neoplasm of prostate: Secondary | ICD-10-CM

## 2020-11-12 DIAGNOSIS — M5441 Lumbago with sciatica, right side: Secondary | ICD-10-CM

## 2020-11-12 DIAGNOSIS — R972 Elevated prostate specific antigen [PSA]: Secondary | ICD-10-CM | POA: Insufficient documentation

## 2020-11-12 DIAGNOSIS — R52 Pain, unspecified: Secondary | ICD-10-CM | POA: Insufficient documentation

## 2020-11-12 DIAGNOSIS — M5442 Lumbago with sciatica, left side: Secondary | ICD-10-CM

## 2020-11-12 DIAGNOSIS — C7951 Secondary malignant neoplasm of bone: Secondary | ICD-10-CM

## 2020-11-12 DIAGNOSIS — G8929 Other chronic pain: Secondary | ICD-10-CM

## 2020-11-12 LAB — CBC WITH DIFFERENTIAL (CANCER CENTER ONLY)
Abs Immature Granulocytes: 0.47 10*3/uL — ABNORMAL HIGH (ref 0.00–0.07)
Basophils Absolute: 0.1 10*3/uL (ref 0.0–0.1)
Basophils Relative: 0 %
Eosinophils Absolute: 0 10*3/uL (ref 0.0–0.5)
Eosinophils Relative: 0 %
HCT: 36.7 % — ABNORMAL LOW (ref 39.0–52.0)
Hemoglobin: 12.4 g/dL — ABNORMAL LOW (ref 13.0–17.0)
Immature Granulocytes: 3 %
Lymphocytes Relative: 11 %
Lymphs Abs: 1.7 10*3/uL (ref 0.7–4.0)
MCH: 30.2 pg (ref 26.0–34.0)
MCHC: 33.8 g/dL (ref 30.0–36.0)
MCV: 89.5 fL (ref 80.0–100.0)
Monocytes Absolute: 0.3 10*3/uL (ref 0.1–1.0)
Monocytes Relative: 2 %
Neutro Abs: 12.4 10*3/uL — ABNORMAL HIGH (ref 1.7–7.7)
Neutrophils Relative %: 84 %
Platelet Count: 217 10*3/uL (ref 150–400)
RBC: 4.1 MIL/uL — ABNORMAL LOW (ref 4.22–5.81)
RDW: 15.5 % (ref 11.5–15.5)
WBC Count: 14.9 10*3/uL — ABNORMAL HIGH (ref 4.0–10.5)
nRBC: 0 % (ref 0.0–0.2)

## 2020-11-12 LAB — CMP (CANCER CENTER ONLY)
ALT: 42 U/L (ref 0–44)
AST: 29 U/L (ref 15–41)
Albumin: 4.1 g/dL (ref 3.5–5.0)
Alkaline Phosphatase: 367 U/L — ABNORMAL HIGH (ref 38–126)
Anion gap: 7 (ref 5–15)
BUN: 10 mg/dL (ref 6–20)
CO2: 26 mmol/L (ref 22–32)
Calcium: 9.1 mg/dL (ref 8.9–10.3)
Chloride: 104 mmol/L (ref 98–111)
Creatinine: 0.62 mg/dL (ref 0.61–1.24)
GFR, Estimated: >60 mL/min (ref >60)
Glucose, Bld: 161 mg/dL — ABNORMAL HIGH (ref 70–99)
Potassium: 4.3 mmol/L (ref 3.5–5.1)
Sodium: 137 mmol/L (ref 135–145)
Total Bilirubin: 0.6 mg/dL (ref 0.3–1.2)
Total Protein: 6.5 g/dL (ref 6.5–8.1)

## 2020-11-12 MED ORDER — HYDROMORPHONE HCL 4 MG/ML IJ SOLN
4.0000 mg | Freq: Once | INTRAMUSCULAR | Status: AC
  Administered 2020-11-12: 4 mg via INTRAVENOUS
  Filled 2020-11-12: qty 1

## 2020-11-12 MED ORDER — FENTANYL 100 MCG/HR TD PT72: 2.0000 | MEDICATED_PATCH | TRANSDERMAL | 0 refills | Status: DC

## 2020-11-12 MED ORDER — KETOROLAC TROMETHAMINE 15 MG/ML IJ SOLN
30.0000 mg | Freq: Once | INTRAMUSCULAR | Status: AC
Start: 2020-11-12 — End: 2020-11-12
  Administered 2020-11-12: 30 mg via INTRAVENOUS
  Filled 2020-11-12: qty 2

## 2020-11-12 MED ORDER — KETOROLAC TROMETHAMINE 30 MG/ML IJ SOLN: 30.0000 mg | Freq: Once | INTRAMUSCULAR | Status: DC

## 2020-11-12 MED ORDER — HYDROMORPHONE HCL 1 MG/ML IJ SOLN: 4.0000 mg | Freq: Once | INTRAMUSCULAR | Status: DC

## 2020-11-12 MED ORDER — SODIUM CHLORIDE 0.9% FLUSH
10.0000 mL | Freq: Once | INTRAVENOUS | Status: AC
  Administered 2020-11-12: 10 mL via INTRAVENOUS

## 2020-11-12 MED ORDER — HEPARIN SOD (PORK) LOCK FLUSH 100 UNIT/ML IV SOLN
500.0000 [IU] | Freq: Once | INTRAVENOUS | Status: AC
  Administered 2020-11-12: 500 [IU] via INTRAVENOUS

## 2020-11-12 MED ORDER — SODIUM CHLORIDE 0.9 % IV SOLN: INTRAVENOUS | Status: DC

## 2020-11-12 MED ORDER — MORPHINE SULFATE 30 MG PO TABS: 30.0000 mg | ORAL_TABLET | ORAL | 0 refills | Status: DC | PRN

## 2020-11-12 MED ORDER — HYDROMORPHONE HCL 1 MG/ML IJ SOLN
4.0000 mg | Freq: Once | INTRAMUSCULAR | Status: DC
  Filled 2020-11-12: qty 4

## 2020-11-12 MED ORDER — SODIUM CHLORIDE 0.9 % IV SOLN: INTRAVENOUS | Status: AC

## 2020-11-12 NOTE — Patient Instructions (Signed)

## 2020-11-12 NOTE — Patient Instructions (Signed)
Implanted Port Home Guide An implanted port is a device that is placed under the skin. It is usually placed in the chest. The device can be used to give IV medicine, to take blood, or for dialysis. You may have an implanted port if: You need IV medicine that would be irritating to the small veins in your hands or arms. You need IV medicines, such as antibiotics, for a long period of time. You need IV nutrition for a long period of time. You need dialysis. When you have a port, your health care provider can choose to use the port instead of veins in your arms for these procedures. You may have fewer limitations when using a port than you would if you used other types of long-term IVs, and you will likely be able to return to normal activities after your incision heals. An implanted port has two main parts: Reservoir. The reservoir is the part where a needle is inserted to give medicines or draw blood. The reservoir is round. After it is placed, it appears as a small, raised area under your skin. Catheter. The catheter is a thin, flexible tube that connects the reservoir to a vein. Medicine that is inserted into the reservoir goes into the catheter and then into the vein. How is my port accessed? To access your port: A numbing cream may be placed on the skin over the port site. Your health care provider will put on a mask and sterile gloves. The skin over your port will be cleaned carefully with a germ-killing soap and allowed to dry. Your health care provider will gently pinch the port and insert a needle into it. Your health care provider will check for a blood return to make sure the port is in the vein and is not clogged. If your port needs to remain accessed to get medicine continuously (constant infusion), your health care provider will place a clear bandage (dressing) over the needle site. The dressing and needle will need to be changed every week, or as told by your health care provider. What  is flushing? Flushing helps keep the port from getting clogged. Follow instructions from your health care provider about how and when to flush the port. Ports are usually flushed with saline solution or a medicine called heparin. The need for flushing will depend on how the port is used: If the port is only used from time to time to give medicines or draw blood, the port may need to be flushed: Before and after medicines have been given. Before and after blood has been drawn. As part of routine maintenance. Flushing may be recommended every 4-6 weeks. If a constant infusion is running, the port may not need to be flushed. Throw away any syringes in a disposal container that is meant for sharp items (sharps container). You can buy a sharps container from a pharmacy, or you can make one by using an empty hard plastic bottle with a cover. How long will my port stay implanted? The port can stay in for as long as your health care provider thinks it is needed. When it is time for the port to come out, a surgery will be done to remove it. The surgery will be similar to the procedure that was done to put the port in. Follow these instructions at home:  Flush your port as told by your health care provider. If you need an infusion over several days, follow instructions from your health care provider about how   to take care of your port site. Make sure you: Wash your hands with soap and water before you change your dressing. If soap and water are not available, use alcohol-based hand sanitizer. Change your dressing as told by your health care provider. Place any used dressings or infusion bags into a plastic bag. Throw that bag in the trash. Keep the dressing that covers the needle clean and dry. Do not get it wet. Do not use scissors or sharp objects near the tube. Keep the tube clamped, unless it is being used. Check your port site every day for signs of infection. Check for: Redness, swelling, or  pain. Fluid or blood. Pus or a bad smell. Protect the skin around the port site. Avoid wearing bra straps that rub or irritate the site. Protect the skin around your port from seat belts. Place a soft pad over your chest if needed. Bathe or shower as told by your health care provider. The site may get wet as long as you are not actively receiving an infusion. Return to your normal activities as told by your health care provider. Ask your health care provider what activities are safe for you. Carry a medical alert card or wear a medical alert bracelet at all times. This will let health care providers know that you have an implanted port in case of an emergency. Get help right away if: You have redness, swelling, or pain at the port site. You have fluid or blood coming from your port site. You have pus or a bad smell coming from the port site. You have a fever. Summary Implanted ports are usually placed in the chest for long-term IV access. Follow instructions from your health care provider about flushing the port and changing bandages (dressings). Take care of the area around your port by avoiding clothing that puts pressure on the area, and by watching for signs of infection. Protect the skin around your port from seat belts. Place a soft pad over your chest if needed. Get help right away if you have a fever or you have redness, swelling, pain, drainage, or a bad smell at the port site. This information is not intended to replace advice given to you by your health care provider. Make sure you discuss any questions you have with your health care provider. Document Revised: 04/04/2020 Document Reviewed: 05/30/2019 Elsevier Patient Education  2022 Elsevier Inc.  

## 2020-11-12 NOTE — Progress Notes (Signed)
Hematology and Oncology Follow Up Visit  Ryan Davis [id number] 04/19/1960 60 y.o. 11/12/2020   Principle Diagnosis:  Metastatic prostate cancer - androgen resistant    Past Therapy: 14 fractions of radiation to the lumbar spine Casodex 50 mg by mouth daily - stopped 04/02/2017 due to progression  Zytiga 1000 mg by mouth daily/prednisone 10 mg by mouth daily - started on 04/20/2016 - d/c on 04/02/2017 Xtandi 160 mg po q day - started on 04/06/2017 -- d/c on 06/24/2017 Flutamide 250 mg po q8hr - started 07/08/2017 -- d/c on 04/23/18   Current Therapy:        Lupron 11.5 mg IM every 3 month - next dose 09/2020  Zometa 4 mg IV q 3 months - next dose 09/2020 Apalutamide 240 mg po q day -- started on 04/28/2018 Taxotere --  s/p cycle #3--  start on 09/05/2020 -- d/c on 11/12/2020 due to progression Pluvicto -- Start on 11/2020   Interim History:  [name] is here today for follow-up.  Surprisingly, the Taxotere is no longer working.  His last PSA was up to 120.   I am not sure as to why this is not working.  I really thought that we would get some mileage out of the Taxotere.  He had never had chemotherapy.  I think that the next step for him is Pluvicto.  He did have the  PSMA PET scan.  This is show that he had marked activity with his disease.  As such, Pluvicto would not be a bad idea.  His pain is the real problem.  He is having more in the way of pain.  This is bony pain.  This is where his cancer really is attacking.  He is on Duragesic at 200 mcg every 3 days.  I told him to increase this to every 2-day dosing.  We we will see about some morphine for him.  He cannot tolerate oxycodone.  We will try morphine sulfate at 30 mg every 4 hours as needed.  He is gained little bit of weight.  He is eating a little bit better.  He is little bit constipated.  I told him to try the MiraLAX twice a day.  Quality of life clearly is what we want.  I just wanted to be more comfortable and more  active.  I know he has a lot of good support at home.  There is no cough.  He has had no problems with fever.  He has had no bleeding.  He has had no rashes.  There is been no leg swelling.  Overall, his performance status is probably ECOG 1.    .    Medications:  Allergies as of 11/12/2020       Reactions   Contrast Media [iodinated Diagnostic Agents] Nausea And Vomiting   Patient vomits immediately after IV contrast is injected just prior to start of scan, was unable to scan patient in correct time frame.  Patient did not warn me of this when dosing the patient with oral CM and asking the patient history questions until the patient was on the table for the CT scan just prior to injection.  Ct Tech:  Karl Bales 07/29/17    Other Nausea And Vomiting   Patient vomits immediately after IV contrast is injected just prior to start of scan, was unable to scan patient in correct time frame.  Patient did not warn me of this when dosing the patient with oral CM  and asking the patient history questions until the patient was on the table for the CT scan just prior to injection.  Ct Tech:  Karl Bales 07/29/17         Medication List        Accurate as of November 12, 2020  8:34 AM. If you have any questions, ask your nurse or doctor.          aspirin 81 MG chewable tablet Chew 81 mg by mouth daily.   famotidine 40 MG tablet Commonly known as: PEPCID Take 1 tablet (40 mg total) by mouth 2 (two) times daily.   fentaNYL 100 MCG/HR Commonly known as: [place] 2 patches onto the skin every 3 (three) days.   gabapentin 800 MG tablet Commonly known as: NEURONTIN Take 800 mg by mouth 3 (three) times daily. May take an additional 400mg  as needed for pain   lidocaine-prilocaine cream Commonly known as: EMLA Apply to affected area once What changed:  how much to take how to take this when to take this reasons to take this additional instructions   metoprolol tartrate 25 MG  tablet Commonly known as: LOPRESSOR TAKE 1/2 TABLET(12.5 MG) BY MOUTH TWICE DAILY What changed: See the new instructions.   Naloxone HCl 0.4 MG/0.4ML Soaj Use in the event of accidental overdose of opioids.   ondansetron 8 MG tablet Commonly known as: Zofran Take 1 tablet (8 mg total) by mouth 2 (two) times daily as needed for refractory nausea / vomiting.   Oxycodone HCl 20 MG Tabs Take 1 tablet (20 mg total) by mouth every 4 (four) hours as needed.   pantoprazole 40 MG tablet Commonly known as: Protonix Take 1 tablet (40 mg total) by mouth daily.   polyethylene glycol 17 g packet Commonly known as: MIRALAX / GLYCOLAX Take 17 g by mouth 2 (two) times daily.   predniSONE 5 MG tablet Commonly known as: DELTASONE Take 1 tablet (5 mg total) by mouth in the morning and at bedtime.   prochlorperazine 10 MG tablet Commonly known as: COMPAZINE Take 1 tablet (10 mg total) by mouth every 6 (six) hours as needed (Nausea or vomiting).   senna 8.6 MG Tabs tablet Commonly known as: SENOKOT Take 2 tablets (17.2 mg total) by mouth 2 (two) times daily.   tamsulosin 0.4 MG Caps capsule Commonly known as: FLOMAX Take 1 capsule (0.4 mg total) by mouth daily.   Trulance 3 MG Tabs Generic drug: Plecanatide Take by mouth.        Allergies:  Allergies  Allergen Reactions   Contrast Media [Iodinated Diagnostic Agents] Nausea And Vomiting    Patient vomits immediately after IV contrast is injected just prior to start of scan, was unable to scan patient in correct time frame.  Patient did not warn me of this when dosing the patient with oral CM and asking the patient history questions until the patient was on the table for the CT scan just prior to injection.  Ct Tech:  Karl Bales 07/29/17    Other Nausea And Vomiting    Patient vomits immediately after IV contrast is injected just prior to start of scan, was unable to scan patient in correct time frame.  Patient did not warn me of this  when dosing the patient with oral CM and asking the patient history questions until the patient was on the table for the CT scan just prior to injection.  Ct Tech:  Karl Bales 07/29/17     Past  Medical History, Surgical history, Social history, and Family History were reviewed and updated.  Review of Systems: Review of Systems  Constitutional: Negative.   HENT: Negative.    Eyes: Negative.   Respiratory: Negative.    Cardiovascular: Negative.   Gastrointestinal: Negative.   Genitourinary: Negative.   Musculoskeletal:  Positive for back pain, joint pain and neck pain.  Skin: Negative.   Neurological: Negative.   Endo/Heme/Allergies: Negative.   Psychiatric/Behavioral: Negative.      Physical Exam:  weight is 161 lb (73 kg). His oral temperature is 98.6 F (37 C). His blood pressure is 148/96 (abnormal) and his pulse is 78. His respiration is 20 and oxygen saturation is 100%.   Wt Readings from Last 3 Encounters:  11/12/20 161 lb (73 kg)  10/22/20 153 lb 6.4 oz (69.6 kg)  10/18/20 153 lb (69.4 kg)    Physical Exam Vitals reviewed.  HENT:     Head: Normocephalic and atraumatic.  Eyes:     Pupils: Pupils are equal, round, and reactive to light.  Cardiovascular:     Rate and Rhythm: Normal rate and regular rhythm.     Heart sounds: Normal heart sounds.  Pulmonary:     Effort: Pulmonary effort is normal.     Breath sounds: Normal breath sounds.  Abdominal:     General: Bowel sounds are normal.     Palpations: Abdomen is soft.  Musculoskeletal:        General: No tenderness or deformity. Normal range of motion.     Cervical back: Normal range of motion.  Lymphadenopathy:     Cervical: No cervical adenopathy.  Skin:    General: Skin is warm and dry.     Findings: No erythema or rash.  Neurological:     Mental Status: He is alert and oriented to person, place, and time.  Psychiatric:        Behavior: Behavior normal.        Thought Content: Thought content normal.         Judgment: Judgment normal.    Lab Results  Component Value Date   WBC 14.9 (H) 11/12/2020   HGB 12.4 (L) 11/12/2020   HCT 36.7 (L) 11/12/2020   MCV 89.5 11/12/2020   PLT 217 11/12/2020   Lab Results  Component Value Date   FERRITIN 216 11/04/2017   IRON 77 11/04/2017   TIBC 261 11/04/2017   UIBC 184 11/04/2017   IRONPCTSAT 30 (L) 11/04/2017   Lab Results  Component Value Date   RBC 4.10 (L) 11/12/2020   Lab Results  Component Value Date   KAPLAMBRATIO 0.89 03/28/2016   Lab Results  Component Value Date   IGGSERUM 1,066 03/28/2016   IGMSERUM 106 03/28/2016   No results found for: Odetta Pink, SPEI   Chemistry      Component Value Date/Time   NA 140 10/22/2020 1316   NA 143 12/16/2016 1022   K 3.9 10/22/2020 1316   K 3.5 12/16/2016 1022   CL 104 10/22/2020 1316   CL 101 12/16/2016 1022   CO2 27 10/22/2020 1316   CO2 33 12/16/2016 1022   BUN 6 10/22/2020 1316   BUN 4 (L) 12/16/2016 1022   CREATININE 0.68 10/22/2020 1316   CREATININE 0.8 12/16/2016 1022      Component Value Date/Time   CALCIUM 8.6 (L) 10/22/2020 1316   CALCIUM 9.4 12/16/2016 1022   ALKPHOS 215 (H) 10/22/2020 1316   ALKPHOS 77 12/16/2016  1022   AST 17 10/22/2020 1316   ALT 12 10/22/2020 1316   ALT 16 12/16/2016 1022   BILITOT 0.4 10/22/2020 1316       Impression and Plan: Mr. Murch is a very pleasant 60 yo African American gentleman with metastatic prostate cancer.  He had progressed despite Taxotere.  Hopefully, we can get Pluvicto for him.  I will see a reason why we could not.  He is already had the scan that shows that his tumor is highly PSMA active.  Again, our goal is to make sure that he is comfortable.  We will hopefully make adjustments to his pain regimen and see if this can help.  Given some IV fluid in the office today.  He will get some IV Dilaudid and IV Toradol.  We will see if this can help a little bit.  We  will plan to get him back to see [place] in about a month.  I would like to hope that by then, he would have had his first dose of Pluvicto.   [name], MD 10/17/20228:34 AM

## 2020-11-13 LAB — PSA, TOTAL AND FREE
PSA, Free Pct: 15.3 %
PSA, Free: 22.7 ng/mL
Prostate Specific Ag, Serum: 148 ng/mL — ABNORMAL HIGH (ref 0.0–4.0)

## 2020-11-14 ENCOUNTER — Other Ambulatory Visit: Payer: Self-pay

## 2020-11-14 ENCOUNTER — Emergency Department (HOSPITAL_BASED_OUTPATIENT_CLINIC_OR_DEPARTMENT_OTHER)
Admission: EM | Admit: 2020-11-14 | Discharge: 2020-11-15 | Disposition: A | Discharge status: Home / Self Care | Payer: Medicare Other | Attending: Emergency Medicine | Admitting: Emergency Medicine

## 2020-11-14 ENCOUNTER — Encounter (HOSPITAL_BASED_OUTPATIENT_CLINIC_OR_DEPARTMENT_OTHER): Payer: Self-pay | Admitting: Emergency Medicine

## 2020-11-14 DIAGNOSIS — Z8546 Personal history of malignant neoplasm of prostate: Secondary | ICD-10-CM | POA: Diagnosis not present

## 2020-11-14 DIAGNOSIS — Z87891 Personal history of nicotine dependence: Secondary | ICD-10-CM | POA: Diagnosis not present

## 2020-11-14 DIAGNOSIS — R109 Unspecified abdominal pain: Secondary | ICD-10-CM | POA: Diagnosis not present

## 2020-11-14 DIAGNOSIS — R0602 Shortness of breath: Secondary | ICD-10-CM | POA: Diagnosis not present

## 2020-11-14 DIAGNOSIS — R2243 Localized swelling, mass and lump, lower limb, bilateral: Secondary | ICD-10-CM | POA: Insufficient documentation

## 2020-11-14 DIAGNOSIS — G893 Neoplasm related pain (acute) (chronic): Secondary | ICD-10-CM | POA: Insufficient documentation

## 2020-11-14 DIAGNOSIS — G8929 Other chronic pain: Secondary | ICD-10-CM | POA: Diagnosis not present

## 2020-11-14 DIAGNOSIS — M545 Low back pain, unspecified: Secondary | ICD-10-CM | POA: Insufficient documentation

## 2020-11-14 DIAGNOSIS — R112 Nausea with vomiting, unspecified: Secondary | ICD-10-CM | POA: Insufficient documentation

## 2020-11-14 MED ORDER — HYDROMORPHONE HCL 1 MG/ML IJ SOLN
1.0000 mg | Freq: Once | INTRAMUSCULAR | Status: AC
Start: 2020-11-15 — End: 2020-11-15
  Administered 2020-11-15: 1 mg via INTRAMUSCULAR
  Filled 2020-11-14: qty 1

## 2020-11-14 NOTE — ED Triage Notes (Signed)
Pt is c/o shortness of breath all day  Pt states his legs are swollen  Pt is c/o abd pain and back pain   Pt states he has had nausea, vomiting, and diarrhea  Pt moaning in triage

## 2020-11-15 ENCOUNTER — Other Ambulatory Visit: Payer: Self-pay

## 2020-11-15 ENCOUNTER — Emergency Department (HOSPITAL_BASED_OUTPATIENT_CLINIC_OR_DEPARTMENT_OTHER): Payer: Medicare Other

## 2020-11-15 DIAGNOSIS — M545 Low back pain, unspecified: Secondary | ICD-10-CM | POA: Diagnosis not present

## 2020-11-15 LAB — URINALYSIS, ROUTINE W REFLEX MICROSCOPIC
Bilirubin Urine: NEGATIVE
Glucose, UA: NEGATIVE mg/dL
Hgb urine dipstick: NEGATIVE
Ketones, ur: 15 mg/dL — AB
Leukocytes,Ua: NEGATIVE
Nitrite: NEGATIVE
Protein, ur: NEGATIVE mg/dL
Specific Gravity, Urine: 1.02 (ref 1.005–1.030)
pH: 7.5 (ref 5.0–8.0)

## 2020-11-15 LAB — CBC WITH DIFFERENTIAL/PLATELET
Abs Immature Granulocytes: 0.45 10*3/uL — ABNORMAL HIGH (ref 0.00–0.07)
Basophils Absolute: 0.1 10*3/uL (ref 0.0–0.1)
Basophils Relative: 0 %
Eosinophils Absolute: 0 10*3/uL (ref 0.0–0.5)
Eosinophils Relative: 0 %
HCT: 32.6 % — ABNORMAL LOW (ref 39.0–52.0)
Hemoglobin: 11.3 g/dL — ABNORMAL LOW (ref 13.0–17.0)
Immature Granulocytes: 2 %
Lymphocytes Relative: 9 %
Lymphs Abs: 1.7 10*3/uL (ref 0.7–4.0)
MCH: 30.4 pg (ref 26.0–34.0)
MCHC: 34.7 g/dL (ref 30.0–36.0)
MCV: 87.6 fL (ref 80.0–100.0)
Monocytes Absolute: 1.2 10*3/uL — ABNORMAL HIGH (ref 0.1–1.0)
Monocytes Relative: 7 %
Neutro Abs: 15.3 10*3/uL — ABNORMAL HIGH (ref 1.7–7.7)
Neutrophils Relative %: 82 %
Platelets: 166 10*3/uL (ref 150–400)
RBC: 3.72 MIL/uL — ABNORMAL LOW (ref 4.22–5.81)
RDW: 15.9 % — ABNORMAL HIGH (ref 11.5–15.5)
WBC: 18.8 10*3/uL — ABNORMAL HIGH (ref 4.0–10.5)
nRBC: 0.1 % (ref 0.0–0.2)

## 2020-11-15 LAB — COMPREHENSIVE METABOLIC PANEL
ALT: 43 U/L (ref 0–44)
AST: 29 U/L (ref 15–41)
Albumin: 4 g/dL (ref 3.5–5.0)
Alkaline Phosphatase: 329 U/L — ABNORMAL HIGH (ref 38–126)
Anion gap: 12 (ref 5–15)
BUN: 11 mg/dL (ref 6–20)
CO2: 21 mmol/L — ABNORMAL LOW (ref 22–32)
Calcium: 8.7 mg/dL — ABNORMAL LOW (ref 8.9–10.3)
Chloride: 101 mmol/L (ref 98–111)
Creatinine, Ser: 0.6 mg/dL — ABNORMAL LOW (ref 0.61–1.24)
GFR, Estimated: >60 mL/min (ref >60)
Glucose, Bld: 122 mg/dL — ABNORMAL HIGH (ref 70–99)
Potassium: 3.5 mmol/L (ref 3.5–5.1)
Sodium: 134 mmol/L — ABNORMAL LOW (ref 135–145)
Total Bilirubin: 0.6 mg/dL (ref 0.3–1.2)
Total Protein: 6.5 g/dL (ref 6.5–8.1)

## 2020-11-15 LAB — TROPONIN I (HIGH SENSITIVITY): Troponin I (High Sensitivity): 3 ng/L (ref <18)

## 2020-11-15 LAB — LIPASE, BLOOD: Lipase: 19 U/L (ref 11–51)

## 2020-11-15 LAB — BRAIN NATRIURETIC PEPTIDE: B Natriuretic Peptide: 78.2 pg/mL (ref 0.0–100.0)

## 2020-11-15 MED ORDER — HEPARIN SOD (PORK) LOCK FLUSH 100 UNIT/ML IV SOLN
500.0000 [IU] | Freq: Once | INTRAVENOUS | Status: AC
  Administered 2020-11-15: 500 [IU]
  Filled 2020-11-15: qty 5

## 2020-11-15 MED ORDER — OXYCODONE-ACETAMINOPHEN 5-325 MG PO TABS
2.0000 | ORAL_TABLET | Freq: Once | ORAL | Status: AC
  Administered 2020-11-15: 2 via ORAL
  Filled 2020-11-15: qty 2

## 2020-11-15 NOTE — ED Provider Notes (Signed)
Emergency Department Provider Note   I have reviewed the triage vital signs and the nursing notes.   HISTORY  Chief Complaint Back Pain and Shortness of Breath   HPI Ryan Davis is a 60 y.o. male with past medical history reviewed below including metastatic cancer with bony metastasis and chronic pain presents to the emergency department with pain in his back and abdomen.  Patient states symptoms are severe and consistent with his chronic cancer pain.  He describes swelling in the legs along with feeling shortness of breath.  He has had nausea and vomiting.  He is not having chest pain or headaches.  He is compliant with his home medications including fentanyl patch and oral pain medications at home. Pain is severe and constant. Denies fever.   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    Patient Active Problem List   Diagnosis Date Noted   Abdominal pain 10/18/2020   Goals of care, counseling/discussion 06/24/2017   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   Atrial fibrillation ([place]) 03/30/2016   Back pain without radiculopathy 03/28/2016   Atrial fibrillation with RVR ([place]) 03/28/2016    Past Surgical History:  Procedure Laterality Date   IR IMAGING GUIDED PORT INSERTION  09/05/2020   MULTIPLE EXTRACTIONS WITH ALVEOLOPLASTY N/A 11/19/2016   Procedure: EXTRACTION OF TOOTH #'S 1,3,5-7,9-17, AND 20- 29 WITH ALVEOLOPLASTY, BILATERAL MANDIBULAR TORI  REDUCTIONS AND BILATERAL MAXILLARY BUCCAL EXOSTOSES REDUCTIONS;  Surgeon: Lenn Cal, DDS;  Location: [facility] OR[facility];  Service: Oral Surgery;  Laterality: N/A;   NO PAST SURGERIES      Allergies Contrast media [iodinated diagnostic agents] and Other  Family History  Problem Relation Age of Onset   Stroke Sister    Lupus Mother    Cancer Neg Hx    Heart disease Neg Hx    Diabetes Neg Hx     Social History Social History   Tobacco Use   Smoking status: Former    Packs/day: 0.50    Years: 39.00    Pack years: 19.50    Types: Cigarettes    Quit date: 04/30/2020    Years since quitting: 0.5   Smokeless tobacco: Never  Vaping Use   Vaping Use: Never used  Substance Use Topics   Alcohol use: Not Currently   Drug use: No    Review of Systems  Constitutional: No fever/chills Eyes: No visual changes. ENT: No sore throat. Cardiovascular: Denies chest pain. Respiratory: Positive shortness of breath. Gastrointestinal: Positive abdominal pain. Positive nausea and vomiting.  No diarrhea.  No constipation. Genitourinary: Negative for dysuria. Musculoskeletal: Positive for back pain. Skin: Negative for rash. Neurological: Negative for headaches, focal weakness or numbness.  10-point ROS otherwise negative.  ____________________________________________   PHYSICAL EXAM:  VITAL SIGNS: ED Triage Vitals  Enc Vitals Group     BP 11/14/20 2340 (!) 151/87     Pulse Rate 11/14/20 2340 85  Resp 11/14/20 2340 20     Temp 11/14/20 2340 97.8 F (36.6 C)     Temp Source 11/14/20 2340 Oral     SpO2 11/14/20 2340 97 %     Weight 11/14/20 2337 165 lb (74.8 kg)     Height 11/14/20 2337 6\' 2"  (1.88 m)   Constitutional: Alert and oriented. Chronically ill appearing with frequent moaning and shifting in bed.  Eyes: Conjunctivae are normal.  Head: Atraumatic. Nose: No congestion/rhinnorhea. Mouth/Throat: Mucous membranes are slightly dry.  Neck: No stridor.   Cardiovascular:  Normal rate, regular rhythm. Good peripheral circulation. Grossly normal heart sounds.   Respiratory: Normal respiratory effort.  No retractions. Lungs CTAB. Gastrointestinal: Soft with diffuse mild tenderness. No peritoneal findings. No distention.  Musculoskeletal: No lower extremity tenderness nor edema. No gross deformities of extremities. Neurologic:  Normal speech and language.  Skin:  Skin is warm, dry and intact. No rash noted.   ____________________________________________   LABS (all labs ordered are listed, but only abnormal results are displayed)  Labs Reviewed  COMPREHENSIVE METABOLIC PANEL - Abnormal; Notable for the following components:      Result Value   Sodium 134 (*)    CO2 21 (*)    Glucose, Bld 122 (*)    Creatinine, Ser 0.60 (*)    Calcium 8.7 (*)    Alkaline Phosphatase 329 (*)    All other components within normal limits  CBC WITH DIFFERENTIAL/PLATELET - Abnormal; Notable for the following components:   WBC 18.8 (*)    RBC 3.72 (*)    Hemoglobin 11.3 (*)    HCT 32.6 (*)    RDW 15.9 (*)    Neutro Abs 15.3 (*)    Monocytes Absolute 1.2 (*)    Abs Immature Granulocytes 0.45 (*)    All other components within normal limits  URINALYSIS, ROUTINE W REFLEX MICROSCOPIC - Abnormal; Notable for the following components:   Ketones, ur 15 (*)    All other components within normal limits  LIPASE, BLOOD  BRAIN NATRIURETIC PEPTIDE  TROPONIN I (HIGH SENSITIVITY)  TROPONIN I (HIGH SENSITIVITY)   ____________________________________________  EKG   EKG Interpretation  Date/Time:  Thursday November 15 2020 00:11:58 EDT Ventricular Rate:  85 PR Interval:  149 QRS Duration: 87 QT Interval:  363 QTC Calculation: 432 R Axis:   82 Text Interpretation: Sinus rhythm Probable anteroseptal infarct, old Baseline wander in lead(s) I II III aVR aVL aVF V4 V6 Confirmed by Nanda Quinton [phone]) on 11/15/2020 1:01:20 AM         ____________________________________________  RADIOLOGY  CT abdomen/pelvis reviewed.   ____________________________________________   PROCEDURES  Procedure(s) performed:   Procedures  None  ____________________________________________   INITIAL IMPRESSION / ASSESSMENT AND PLAN / ED COURSE  Pertinent labs & imaging results that were available during my care of the patient were reviewed by me and considered in my medical decision making (see chart for details).   Patient presents to the emergency department with acute on chronic pain along with nausea and vomiting.  Abdomen is mildly tender but no peritonitis.  CT imaging obtained showing bony metastatic disease but no pathologic fracture or other acute abnormality to explain worsening pain.  Patient's labs show leukocytosis, question hemoconcentration.  No acute kidney injury.  Troponin along with BNP are within normal limits.  I do not appreciate significant swelling in the lower extremities.  Lung sounds are clear and oxygen saturation is 100%.  Patient is afebrile.  He is not neutropenic.  After pain management in the emergency department he is feeling improved.  Will discharge home with plan to continue his home pain regimen and follow closely with his PCP and oncology teams as an outpatient.   ____________________________________________  FINAL CLINICAL IMPRESSION(S) / ED DIAGNOSES  Final diagnoses:  Chronic bilateral low back pain without sciatica     MEDICATIONS GIVEN DURING THIS VISIT:  Medications  HYDROmorphone (DILAUDID) injection 1 mg (1 mg Intramuscular Given 11/15/20 0055)  oxyCODONE-acetaminophen (PERCOCET/ROXICET) 5-325 MG per tablet 2 tablet (2 tablets Oral Given 11/15/20 0342)  heparin lock flush 100 unit/mL (500 Units Intracatheter Given 11/15/20 0457)     Note:  This document was prepared using Dragon voice recognition software and may include unintentional dictation errors.  Nanda Quinton, MD,  [facility] Emergency Medicine    Henlee Donovan, [facility], MD 11/17/20 [phone]

## 2020-11-15 NOTE — ED Notes (Signed)
Patient refuses to keep VS equipment on. Have spoken with patient extensively and provider is aware.

## 2020-11-15 NOTE — Discharge Instructions (Signed)
Please continue to take your home medications for pain.  Please follow closely with your oncology team.  Return to the emergency department any new or suddenly worsening symptoms.

## 2020-11-16 ENCOUNTER — Other Ambulatory Visit: Payer: Self-pay | Admitting: *Deleted

## 2020-11-16 ENCOUNTER — Encounter: Payer: Self-pay | Admitting: *Deleted

## 2020-11-16 ENCOUNTER — Telehealth: Payer: Self-pay | Admitting: *Deleted

## 2020-11-16 DIAGNOSIS — G8929 Other chronic pain: Secondary | ICD-10-CM

## 2020-11-16 DIAGNOSIS — M549 Dorsalgia, unspecified: Secondary | ICD-10-CM

## 2020-11-16 DIAGNOSIS — C7951 Secondary malignant neoplasm of bone: Secondary | ICD-10-CM

## 2020-11-16 DIAGNOSIS — C61 Malignant neoplasm of prostate: Secondary | ICD-10-CM

## 2020-11-16 DIAGNOSIS — M5442 Lumbago with sciatica, left side: Secondary | ICD-10-CM

## 2020-11-16 MED ORDER — CYCLOBENZAPRINE HCL 10 MG PO TABS
10.0000 mg | ORAL_TABLET | Freq: Three times a day (TID) | ORAL | 0 refills | Status: DC
Start: 2020-11-16 — End: 2020-12-14

## 2020-11-16 NOTE — Telephone Encounter (Signed)
This nurse called patient to inform him that Dr. Marin Olp has sent in a prescription for Flexeril 10 mg every 8 hours. This is a scheduled medication. I asked him to call [facility] to see if it was ready.   Update on PA for Fentanyl: Patient did bring new insurance card today. I called [facility] and gave them the informaton. Also, I called [phone] to initiate PA and gave them new insurance card. Reference # for PA [id number]. The lady I spoke with stated,"the ordering physician will be notified by fax. It takes up to 72 hours for approval. Patient will also be notified by [place]."   I called and spoke with Blayde. I asked him if he had on a Fentanyl patch and how many did he have on hand. He stated,"I'm wearing two 100 mcg patches today. I have 3 boxes (which is 15 patches). I instructed him to continue applying two patches every other day like Dr. Marin Olp ordered. He is aware that we are still waiting on PA for the new prescription. Also, I instructed him to go to the ER over the weekend if the pain was intolerable. He verbalized understanding of conversation.

## 2020-11-16 NOTE — Telephone Encounter (Signed)
This nurse called patient and instructed him to bring his new insurance card to the office so we can make a copy. I explained to him that we need it for PA for his Fentanyl patches and also schedule his upcoming procedure. He stated he would have someone drive him up here today because he is out of patches. He verbalized understanding of conversation.

## 2020-11-16 NOTE — Progress Notes (Signed)
Pt.'s current insurance card brought in today per patient and faxed to [facility] in [facility] at [phone].

## 2020-11-30 ENCOUNTER — Other Ambulatory Visit: Payer: Self-pay

## 2020-11-30 MED ORDER — OXYCODONE HCL 20 MG PO TABS: 20.0000 mg | ORAL_TABLET | ORAL | 0 refills | Status: DC | PRN

## 2020-11-30 NOTE — Telephone Encounter (Signed)
Pt called for refill on his oxycodone that dr.ennever prescribed. Rx request sent to md

## 2020-12-04 ENCOUNTER — Other Ambulatory Visit: Payer: Self-pay

## 2020-12-04 ENCOUNTER — Emergency Department (HOSPITAL_BASED_OUTPATIENT_CLINIC_OR_DEPARTMENT_OTHER): Payer: Medicare Other

## 2020-12-04 ENCOUNTER — Observation Stay (HOSPITAL_COMMUNITY): Payer: Medicare Other

## 2020-12-04 ENCOUNTER — Encounter (HOSPITAL_BASED_OUTPATIENT_CLINIC_OR_DEPARTMENT_OTHER): Payer: Self-pay | Admitting: Emergency Medicine

## 2020-12-04 ENCOUNTER — Inpatient Hospital Stay (HOSPITAL_BASED_OUTPATIENT_CLINIC_OR_DEPARTMENT_OTHER)
Admission: EM | Admit: 2020-12-04 | Discharge: 2020-12-10 | DRG: 948 | Disposition: A | Discharge status: Home / Self Care | Payer: Medicare Other | Attending: Internal Medicine | Admitting: Internal Medicine

## 2020-12-04 ENCOUNTER — Emergency Department: Payer: Medicare Other

## 2020-12-04 DIAGNOSIS — M546 Pain in thoracic spine: Secondary | ICD-10-CM | POA: Diagnosis not present

## 2020-12-04 DIAGNOSIS — Z7982 Long term (current) use of aspirin: Secondary | ICD-10-CM | POA: Diagnosis not present

## 2020-12-04 DIAGNOSIS — G894 Chronic pain syndrome: Secondary | ICD-10-CM | POA: Diagnosis not present

## 2020-12-04 DIAGNOSIS — E876 Hypokalemia: Secondary | ICD-10-CM | POA: Diagnosis present

## 2020-12-04 DIAGNOSIS — Z79899 Other long term (current) drug therapy: Secondary | ICD-10-CM

## 2020-12-04 DIAGNOSIS — C7951 Secondary malignant neoplasm of bone: Secondary | ICD-10-CM | POA: Diagnosis not present

## 2020-12-04 DIAGNOSIS — Z832 Family history of diseases of the blood and blood-forming organs and certain disorders involving the immune mechanism: Secondary | ICD-10-CM | POA: Diagnosis not present

## 2020-12-04 DIAGNOSIS — M5442 Lumbago with sciatica, left side: Secondary | ICD-10-CM

## 2020-12-04 DIAGNOSIS — Z800 Family history of malignant neoplasm of digestive organs: Secondary | ICD-10-CM | POA: Diagnosis not present

## 2020-12-04 DIAGNOSIS — R109 Unspecified abdominal pain: Secondary | ICD-10-CM

## 2020-12-04 DIAGNOSIS — R6881 Early satiety: Secondary | ICD-10-CM | POA: Diagnosis not present

## 2020-12-04 DIAGNOSIS — R7989 Other specified abnormal findings of blood chemistry: Secondary | ICD-10-CM | POA: Diagnosis present

## 2020-12-04 DIAGNOSIS — C61 Malignant neoplasm of prostate: Secondary | ICD-10-CM

## 2020-12-04 DIAGNOSIS — Z87891 Personal history of nicotine dependence: Secondary | ICD-10-CM

## 2020-12-04 DIAGNOSIS — Z823 Family history of stroke: Secondary | ICD-10-CM | POA: Diagnosis not present

## 2020-12-04 DIAGNOSIS — I480 Paroxysmal atrial fibrillation: Secondary | ICD-10-CM | POA: Diagnosis present

## 2020-12-04 DIAGNOSIS — K219 Gastro-esophageal reflux disease without esophagitis: Principal | ICD-10-CM

## 2020-12-04 DIAGNOSIS — K838 Other specified diseases of biliary tract: Secondary | ICD-10-CM | POA: Diagnosis present

## 2020-12-04 DIAGNOSIS — G8929 Other chronic pain: Secondary | ICD-10-CM

## 2020-12-04 DIAGNOSIS — G893 Neoplasm related pain (acute) (chronic): Secondary | ICD-10-CM | POA: Diagnosis not present

## 2020-12-04 DIAGNOSIS — Z91041 Radiographic dye allergy status: Secondary | ICD-10-CM

## 2020-12-04 DIAGNOSIS — Z20822 Contact with and (suspected) exposure to covid-19: Secondary | ICD-10-CM | POA: Diagnosis not present

## 2020-12-04 DIAGNOSIS — R52 Pain, unspecified: Secondary | ICD-10-CM

## 2020-12-04 LAB — URINALYSIS, ROUTINE W REFLEX MICROSCOPIC
Bilirubin Urine: NEGATIVE
Glucose, UA: NEGATIVE mg/dL
Hgb urine dipstick: NEGATIVE
Ketones, ur: 15 mg/dL — AB
Nitrite: NEGATIVE
Protein, ur: NEGATIVE mg/dL
Specific Gravity, Urine: 1.025 (ref 1.005–1.030)
pH: 7 (ref 5.0–8.0)

## 2020-12-04 LAB — RESP PANEL BY RT-PCR (FLU A&B, COVID) ARPGX2
Influenza A by PCR: NEGATIVE
Influenza B by PCR: NEGATIVE
SARS Coronavirus 2 by RT PCR: NEGATIVE

## 2020-12-04 LAB — COMPREHENSIVE METABOLIC PANEL
ALT: 102 U/L — ABNORMAL HIGH (ref 0–44)
AST: 76 U/L — ABNORMAL HIGH (ref 15–41)
Albumin: 4.8 g/dL (ref 3.5–5.0)
Alkaline Phosphatase: 720 U/L — ABNORMAL HIGH (ref 38–126)
Anion gap: 13 (ref 5–15)
BUN: 9 mg/dL (ref 6–20)
CO2: 25 mmol/L (ref 22–32)
Calcium: 9.2 mg/dL (ref 8.9–10.3)
Chloride: 99 mmol/L (ref 98–111)
Creatinine, Ser: 0.71 mg/dL (ref 0.61–1.24)
GFR, Estimated: >60 mL/min (ref >60)
Glucose, Bld: 120 mg/dL — ABNORMAL HIGH (ref 70–99)
Potassium: 3.3 mmol/L — ABNORMAL LOW (ref 3.5–5.1)
Sodium: 137 mmol/L (ref 135–145)
Total Bilirubin: 1.1 mg/dL (ref 0.3–1.2)
Total Protein: 7.9 g/dL (ref 6.5–8.1)

## 2020-12-04 LAB — CBC WITH DIFFERENTIAL/PLATELET
Abs Immature Granulocytes: 0.45 10*3/uL — ABNORMAL HIGH (ref 0.00–0.07)
Basophils Absolute: 0.1 10*3/uL (ref 0.0–0.1)
Basophils Relative: 1 %
Eosinophils Absolute: 0.2 10*3/uL (ref 0.0–0.5)
Eosinophils Relative: 2 %
HCT: 38.1 % — ABNORMAL LOW (ref 39.0–52.0)
Hemoglobin: 12.7 g/dL — ABNORMAL LOW (ref 13.0–17.0)
Immature Granulocytes: 5 %
Lymphocytes Relative: 19 %
Lymphs Abs: 1.9 10*3/uL (ref 0.7–4.0)
MCH: 30 pg (ref 26.0–34.0)
MCHC: 33.3 g/dL (ref 30.0–36.0)
MCV: 89.9 fL (ref 80.0–100.0)
Monocytes Absolute: 0.6 10*3/uL (ref 0.1–1.0)
Monocytes Relative: 6 %
Neutro Abs: 6.7 10*3/uL (ref 1.7–7.7)
Neutrophils Relative %: 67 %
Platelets: 183 10*3/uL (ref 150–400)
RBC: 4.24 MIL/uL (ref 4.22–5.81)
RDW: 17.2 % — ABNORMAL HIGH (ref 11.5–15.5)
WBC: 9.9 10*3/uL (ref 4.0–10.5)
nRBC: 0 % (ref 0.0–0.2)

## 2020-12-04 LAB — URINALYSIS, MICROSCOPIC (REFLEX)

## 2020-12-04 LAB — LIPASE, BLOOD: Lipase: 20 U/L (ref 11–51)

## 2020-12-04 LAB — MAGNESIUM: Magnesium: 2.3 mg/dL (ref 1.7–2.4)

## 2020-12-04 MED ORDER — CYCLOBENZAPRINE HCL 10 MG PO TABS
10.0000 mg | ORAL_TABLET | Freq: Three times a day (TID) | ORAL | Status: DC | PRN
  Administered 2020-12-06 – 2020-12-08 (×2): 10 mg via ORAL
  Filled 2020-12-04 (×2): qty 1

## 2020-12-04 MED ORDER — ONDANSETRON HCL 4 MG/2ML IJ SOLN
4.0000 mg | Freq: Once | INTRAMUSCULAR | Status: AC
  Administered 2020-12-04: 4 mg via INTRAVENOUS
  Filled 2020-12-04: qty 2

## 2020-12-04 MED ORDER — ONDANSETRON HCL 4 MG PO TABS: 4.0000 mg | ORAL_TABLET | Freq: Four times a day (QID) | ORAL | Status: DC | PRN

## 2020-12-04 MED ORDER — KETOROLAC TROMETHAMINE 15 MG/ML IJ SOLN
15.0000 mg | Freq: Once | INTRAMUSCULAR | Status: AC
  Administered 2020-12-04: 15 mg via INTRAVENOUS
  Filled 2020-12-04: qty 1

## 2020-12-04 MED ORDER — PLECANATIDE 3 MG PO TABS: 3.0000 mg | ORAL_TABLET | Freq: Every day | ORAL | Status: DC

## 2020-12-04 MED ORDER — PANTOPRAZOLE SODIUM 40 MG PO TBEC: 40.0000 mg | DELAYED_RELEASE_TABLET | Freq: Every day | ORAL | Status: DC

## 2020-12-04 MED ORDER — METOPROLOL TARTRATE 25 MG PO TABS
12.5000 mg | ORAL_TABLET | Freq: Two times a day (BID) | ORAL | Status: DC
  Administered 2020-12-05 – 2020-12-10 (×12): 12.5 mg via ORAL
  Filled 2020-12-04 (×13): qty 1

## 2020-12-04 MED ORDER — POLYETHYLENE GLYCOL 3350 17 G PO PACK: 17.0000 g | PACK | Freq: Every day | ORAL | Status: DC | PRN

## 2020-12-04 MED ORDER — OXYCODONE HCL 5 MG PO TABS
20.0000 mg | ORAL_TABLET | ORAL | Status: DC | PRN
  Administered 2020-12-06 – 2020-12-08 (×6): 20 mg via ORAL
  Filled 2020-12-04 (×6): qty 4

## 2020-12-04 MED ORDER — FENTANYL 100 MCG/HR TD PT72
2.0000 | MEDICATED_PATCH | TRANSDERMAL | Status: DC
  Administered 2020-12-05: 2 via TRANSDERMAL
  Filled 2020-12-04 (×3): qty 1

## 2020-12-04 MED ORDER — LACTATED RINGERS IV BOLUS
1000.0000 mL | Freq: Once | INTRAVENOUS | Status: AC
  Administered 2020-12-04: 1000 mL via INTRAVENOUS

## 2020-12-04 MED ORDER — ACETAMINOPHEN 325 MG PO TABS
650.0000 mg | ORAL_TABLET | Freq: Four times a day (QID) | ORAL | Status: DC | PRN
  Administered 2020-12-08: 650 mg via ORAL
  Filled 2020-12-04: qty 2

## 2020-12-04 MED ORDER — GABAPENTIN 400 MG PO CAPS
800.0000 mg | ORAL_CAPSULE | Freq: Three times a day (TID) | ORAL | Status: DC
  Administered 2020-12-05 – 2020-12-10 (×15): 800 mg via ORAL
  Filled 2020-12-04 (×15): qty 2

## 2020-12-04 MED ORDER — ENOXAPARIN SODIUM 40 MG/0.4ML IJ SOSY
40.0000 mg | PREFILLED_SYRINGE | INTRAMUSCULAR | Status: DC
  Filled 2020-12-04 (×3): qty 0.4

## 2020-12-04 MED ORDER — TAMSULOSIN HCL 0.4 MG PO CAPS
0.4000 mg | ORAL_CAPSULE | Freq: Every day | ORAL | Status: DC
  Administered 2020-12-05 – 2020-12-10 (×6): 0.4 mg via ORAL
  Filled 2020-12-04 (×6): qty 1

## 2020-12-04 MED ORDER — DIPHENHYDRAMINE HCL 25 MG PO CAPS: 50.0000 mg | ORAL_CAPSULE | Freq: Once | ORAL | Status: AC

## 2020-12-04 MED ORDER — POTASSIUM CHLORIDE CRYS ER 20 MEQ PO TBCR
40.0000 meq | EXTENDED_RELEASE_TABLET | Freq: Once | ORAL | Status: DC
Start: 2020-12-04 — End: 2020-12-07

## 2020-12-04 MED ORDER — ASPIRIN 81 MG PO CHEW
81.0000 mg | CHEWABLE_TABLET | Freq: Every day | ORAL | Status: DC
  Administered 2020-12-05 – 2020-12-10 (×6): 81 mg via ORAL
  Filled 2020-12-04 (×6): qty 1

## 2020-12-04 MED ORDER — HYDROMORPHONE HCL 1 MG/ML IJ SOLN
1.0000 mg | Freq: Once | INTRAMUSCULAR | Status: AC
Start: 2020-12-04 — End: 2020-12-04
  Administered 2020-12-04: 1 mg via INTRAVENOUS
  Filled 2020-12-04: qty 1

## 2020-12-04 MED ORDER — SENNA 8.6 MG PO TABS
2.0000 | ORAL_TABLET | Freq: Two times a day (BID) | ORAL | Status: DC
  Administered 2020-12-05 – 2020-12-10 (×11): 17.2 mg via ORAL
  Filled 2020-12-04 (×12): qty 2

## 2020-12-04 MED ORDER — ONDANSETRON HCL 4 MG/2ML IJ SOLN: 4.0000 mg | Freq: Four times a day (QID) | INTRAMUSCULAR | Status: DC | PRN

## 2020-12-04 MED ORDER — IOHEXOL 350 MG/ML SOLN
100.0000 mL | Freq: Once | INTRAVENOUS | Status: AC | PRN
  Administered 2020-12-04: 100 mL via INTRAVENOUS

## 2020-12-04 MED ORDER — HYDROMORPHONE HCL 1 MG/ML IJ SOLN
2.0000 mg | INTRAMUSCULAR | Status: DC | PRN
  Administered 2020-12-05 – 2020-12-06 (×7): 2 mg via INTRAVENOUS
  Filled 2020-12-04 (×8): qty 2

## 2020-12-04 MED ORDER — HYDROMORPHONE HCL 1 MG/ML IJ SOLN
1.0000 mg | Freq: Once | INTRAMUSCULAR | Status: AC
  Administered 2020-12-04: 1 mg via INTRAVENOUS
  Filled 2020-12-04: qty 1

## 2020-12-04 MED ORDER — LACTATED RINGERS IV SOLN: INTRAVENOUS | Status: AC

## 2020-12-04 MED ORDER — POLYETHYLENE GLYCOL 3350 17 G PO PACK
17.0000 g | PACK | Freq: Two times a day (BID) | ORAL | Status: DC
  Administered 2020-12-05 – 2020-12-06 (×3): 17 g via ORAL
  Filled 2020-12-04 (×5): qty 1

## 2020-12-04 MED ORDER — DIPHENHYDRAMINE HCL 50 MG/ML IJ SOLN
50.0000 mg | Freq: Once | INTRAMUSCULAR | Status: AC
  Administered 2020-12-04: 50 mg via INTRAVENOUS
  Filled 2020-12-04: qty 1

## 2020-12-04 MED ORDER — HYDROCORTISONE SOD SUC (PF) 250 MG IJ SOLR: 200.0000 mg | Freq: Once | INTRAMUSCULAR | Status: DC

## 2020-12-04 MED ORDER — HYDROCORTISONE SOD SUC (PF) 100 MG IJ SOLR
200.0000 mg | Freq: Once | INTRAMUSCULAR | Status: AC
  Administered 2020-12-04: 200 mg via INTRAVENOUS
  Filled 2020-12-04: qty 4

## 2020-12-04 MED ORDER — HYDROMORPHONE HCL 1 MG/ML IJ SOLN
1.0000 mg | INTRAMUSCULAR | Status: DC | PRN
  Administered 2020-12-04 (×2): 1 mg via INTRAVENOUS
  Filled 2020-12-04 (×2): qty 1

## 2020-12-04 MED ORDER — ACETAMINOPHEN 650 MG RE SUPP: 650.0000 mg | Freq: Four times a day (QID) | RECTAL | Status: DC | PRN

## 2020-12-04 MED ORDER — GADOBUTROL 1 MMOL/ML IV SOLN
8.0000 mL | Freq: Once | INTRAVENOUS | Status: AC | PRN
  Administered 2020-12-04: 8 mL via INTRAVENOUS

## 2020-12-04 MED ORDER — PANTOPRAZOLE SODIUM 40 MG IV SOLR
40.0000 mg | INTRAVENOUS | Status: DC
  Administered 2020-12-05: 40 mg via INTRAVENOUS
  Filled 2020-12-04: qty 40

## 2020-12-04 MED ORDER — MELATONIN 5 MG PO TABS
10.0000 mg | ORAL_TABLET | Freq: Every evening | ORAL | Status: DC | PRN
  Administered 2020-12-05 – 2020-12-08 (×2): 10 mg via ORAL
  Filled 2020-12-04 (×3): qty 2

## 2020-12-04 MED ORDER — HYDROCORTISONE SOD SUC (PF) 250 MG IJ SOLR
200.0000 mg | Freq: Once | INTRAMUSCULAR | Status: DC
  Filled 2020-12-04: qty 200

## 2020-12-04 NOTE — Assessment & Plan Note (Signed)
   While elevated transaminases could prove to be a new hepatobiliary process, markedly elevated alkaline phosphatase could additionally be secondary to bony destruction from diffuse metastases  Remainder of assessment and plan as above

## 2020-12-04 NOTE — Assessment & Plan Note (Signed)
·   Please see assessment and plan above °

## 2020-12-04 NOTE — ED Notes (Signed)
Report given to Judson Roch at [facility]

## 2020-12-04 NOTE — ED Notes (Signed)
Pt resting comfortably at this time.

## 2020-12-04 NOTE — Assessment & Plan Note (Signed)
   Currently rate controlled.  On aspirin monotherapy for anticoagulation in the outpatient setting due to low CHA2DS2-VASc score, will continue  Continue home regimen of metoprolol  Monitoring on telemetry

## 2020-12-04 NOTE — ED Notes (Addendum)
Report given to [facility] spoke with Erling Conte they should be here in 20-25 min

## 2020-12-04 NOTE — ED Notes (Signed)
Attempted to obtain lactic acid x 2 without success .  Pt refusing to allow any further sticks.  MD made aware

## 2020-12-04 NOTE — ED Notes (Signed)
Pt states medications not effective for pain, continues to report 10/10

## 2020-12-04 NOTE — Assessment & Plan Note (Signed)
   Transitioning from oral PPI to intravenous Protonix for now

## 2020-12-04 NOTE — ED Triage Notes (Signed)
Pt is c/o abd pain and back pain that started last night  Pt denies nausea, vomiting, or diarrhea  Pt moaning and groaning during triage

## 2020-12-04 NOTE — ED Notes (Signed)
Pt states Back and abdominal pain with leg numbness. States "It just happens sometimes." requesting something for pain.

## 2020-12-04 NOTE — Assessment & Plan Note (Signed)
·   Replacing with potassium chloride °· Evaluating for concurrent hypomagnesemia  °· Monitoring potassium levels with serial chemistries. ° °

## 2020-12-04 NOTE — Assessment & Plan Note (Signed)
   Diagnosed 03/2016  Well-documented history of extensive metastatic disease to multiple bony sites  Actively receiving Lupron, Zometa and seem to be initiated on Pluvicto   Follows with Dr. Marin Olp with oncology  Continue outpatient follow-up after discharge

## 2020-12-04 NOTE — Assessment & Plan Note (Addendum)
   Ongoing substantial abdominal pain.  While patient does have a history of chronic pain, appearance of dilation of the biliary tree in the setting of elevated transaminases could be a new hepatobiliary process and therefore prove to be the source of patient's substantial abdominal and back discomfort.  MRCP ordered  As needed opiate-based analgesics for substantial pain  Transitioning from home p.o. PPI to IV Protonix while here  As needed antiemetics  Intravenous volume resuscitation with isotonic fluids  Based on MRCP results we will consider gastroenterology consultation

## 2020-12-04 NOTE — H&P (Addendum)
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  Patient explains that he suddenly developed epigastric pain 30 minutes after eating dinner evening of 11/7.  Patient describes his abdominal pain as radiating to his right flank.  Pain is severe in intensity, sharp in quality and worse with movement.  Patient states that his home regimen of analgesics are not improving his pain.  Patient does also complain of associated nausea with occasional bouts of vomiting and poor oral intake.  Patient denies fevers, recent travel, sick contacts, recent ingestion of undercooked food or recent contacts with known COVID-19 infection.  Patient's symptoms continued to worsen until he eventually presented to [place] emergency department for evaluation.  Upon evaluation in the emergency department patient underwent extensive work-up including CT angiogram of the chest abdomen and pelvis.  This revealed no evidence of dissection or acute vaso-occlusive disease.  He did redemonstrate stable diffuse sclerotic metastatic disease.  I did also additionally identify intra and extrahepatic biliary dilatation for which radiology recommended MRCP for further evaluation.  Patient was additionally identified to have a rising AST and ALT with elevated alkaline phosphatase.  Right upper quadrant ultrasound was additionally performed which  redemonstrated central intrahepatic biliary dilatation.  The hospitalist group was then called and patient was accepted for transfer to [facility] for continued medical care.  Review of Systems:   Review of Systems  Gastrointestinal:  Positive for abdominal pain, nausea and vomiting.  All other systems reviewed and are negative.  Past Medical History:  Diagnosis Date   A-fib [facility])    see 03-28-16 admission   Back pain without radiculopathy 03/28/2016   Dyspnea    endorses since treatment radiation , still gets SOB occasionally    Dysrhythmia    afib    Goals of care, counseling/discussion 06/24/2017   History of radiation therapy 09/16/16-10/06/16   35 Gy in 14 fractions to the lumbar spine   Nocturia    Prostate cancer metastatic to bone ([place]) 03/31/2016   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[place]) 03/31/2016    Past Surgical History:  Procedure Laterality Date   IR IMAGING GUIDED PORT INSERTION  09/05/2020   MULTIPLE EXTRACTIONS WITH ALVEOLOPLASTY N/A 11/19/2016   Procedure: EXTRACTION OF TOOTH #'S 1,3,5-7,9-17, AND 20- 29 WITH ALVEOLOPLASTY, BILATERAL MANDIBULAR TORI REDUCTIONS AND BILATERAL MAXILLARY BUCCAL EXOSTOSES REDUCTIONS;  Surgeon: Lenn Cal, DDS;  Location: [facility];  Service: Oral Surgery;  Laterality: N/A;   NO PAST SURGERIES       reports that he quit smoking about 7 months ago. His smoking use included cigarettes. He has a 19.50 pack-year smoking history. He has never used smokeless tobacco. He reports that he does not currently use alcohol. He reports that he does not use drugs.  Allergies  Allergen Reactions   Contrast Media [Iodinated Diagnostic Agents] Nausea And Vomiting    Patient vomits immediately after IV contrast is injected just  prior to start of scan, was unable to scan patient in correct time frame.  Patient did not warn me of this when dosing the patient with oral CM and asking the patient history questions until the patient was on  the table for the CT scan just prior to injection.  Ct Tech:  Karl Bales 07/29/17    Other Nausea And Vomiting    Patient vomits immediately after IV contrast is injected just prior to start of scan, was unable to scan patient in correct time frame.  Patient did not warn me of this when dosing the patient with oral CM and asking the patient history questions until the patient was on the table for the CT scan just prior to injection.  Ct Tech:  Karl Bales 07/29/17     Family History  Problem Relation Age of Onset   Stroke Sister    Lupus Mother    Cancer Neg Hx    Heart disease Neg Hx    Diabetes Neg Hx      Prior to Admission medications   Medication Sig Start Date End Date Taking? Authorizing Provider  morphine (MSIR) 30 MG tablet Take 1 tablet (30 mg total) by mouth every 4 (four) hours as needed for severe pain. 11/12/20  Yes [name], MD  aspirin 81 MG chewable tablet Chew 81 mg by mouth daily. 04/09/17   [provider]  cyclobenzaprine (FLEXERIL) 10 MG tablet Take 1 tablet (10 mg total) by mouth 3 (three) times daily. 11/16/20   [name], MD  famotidine (PEPCID) 40 MG tablet Take 1 tablet (40 mg total) by mouth 2 (two) times daily. 09/27/20   [name], MD  fentaNYL (DURAGESIC) 100 MCG/HR Place 2 patches onto the skin every other day. 11/12/20   [name], MD  gabapentin (NEURONTIN) 800 MG tablet Take 800 mg by mouth 3 (three) times daily. May take an additional 400mg  as needed for pain    [provider]  metoprolol tartrate (LOPRESSOR) 25 MG tablet TAKE 1/2 TABLET(12.5 MG) BY MOUTH TWICE DAILY Patient taking differently: Take 12.5 mg by mouth 2 (two) times daily. 09/24/20   [name], MD  Naloxone HCl 0.4 MG/0.4ML SOAJ Use in the event of accidental overdose of opioids. Patient not taking: No sig reported 08/26/20   Arnaldo Natal, MD  Oxycodone HCl 20 MG TABS Take 1 tablet (20 mg total) by mouth every 4 (four) hours as needed  for up to 120 doses. 11/30/20   [name], MD  pantoprazole (PROTONIX) 40 MG tablet Take 1 tablet (40 mg total) by mouth daily. 10/21/20 10/21/21  Shelly Coss, MD  Plecanatide (TRULANCE) 3 MG TABS Take by mouth. 10/24/20   [provider]  polyethylene glycol (MIRALAX / GLYCOLAX) 17 g packet Take 17 g by mouth 2 (two) times daily. 10/21/20   Shelly Coss, MD  senna (SENOKOT) 8.6 MG TABS tablet Take 2 tablets (17.2 mg total) by mouth 2 (two) times daily. 10/21/20   Shelly Coss, MD  tamsulosin (FLOMAX) 0.4 MG CAPS capsule Take 1 capsule (0.4 mg total) by mouth daily. 10/21/20   Shelly Coss, MD  prochlorperazine (COMPAZINE) 10 MG tablet Take 1 tablet (10 mg total) by mouth every 6 (six) hours as needed (Nausea or vomiting). Patient not taking: Reported on 11/12/2020 09/06/20 11/12/20  [name], MD    Physical Exam: Vitals:   12/04/20 1502 12/04/20 1735 12/04/20 1800 12/04/20 1943  BP: Marland Kitchen)  172/95 (!) 148/84 (!) 162/82 (!) 160/103  Pulse: 80 84 84 83  Resp: 18 18 18 15   Temp:    98.6 F (37 C)  TempSrc:    Oral  SpO2: 99% 99% 99% 100%  Weight:      Height:        Constitutional: Awake alert and oriented x3, in mild distress due to abdominal pain  skin: no rashes, no lesions, poor skin turgor noted. Eyes: Pupils are equally reactive to light.  No evidence of scleral icterus or conjunctival pallor.  ENMT: dry mucous membranes noted.  Posterior pharynx clear of any exudate or lesions.   Neck: normal, supple, no masses, no thyromegaly.  No evidence of jugular venous distension.   Respiratory: clear to auscultation bilaterally, no wheezing, no crackles. Normal respiratory effort. No accessory muscle use.  Cardiovascular: Regular rate and rhythm, no murmurs / rubs / gallops. No extremity edema. 2+ pedal pulses. No carotid bruits.  Chest:   Nontender without crepitus or deformity.   Back:   Nontender without crepitus or deformity. Abdomen: Diffuse abdominal  tenderness worst in the epigastric region.  Abdomen is soft however.  No evidence of intra-abdominal masses.  Positive bowel sounds noted in all quadrants.   Musculoskeletal: No joint deformity upper and lower extremities. Good ROM, no contractures. Normal muscle tone.  Neurologic: CN 2-12 grossly intact. Sensation intact.  Patient moving all 4 extremities spontaneously.  Patient is following all commands.  Patient is responsive to verbal stimuli.   Psychiatric: Patient exhibits normal mood with appropriate affect.  Patient seems to possess insight as to their current situation.     Labs on Admission: I have personally reviewed following labs and imaging studies -   CBC: Recent Labs  Lab 12/04/20 0745  WBC 9.9  NEUTROABS 6.7  HGB 12.7*  HCT 38.1*  MCV 89.9  PLT 272   Basic Metabolic Panel: Recent Labs  Lab 12/04/20 0735  NA 137  K 3.3*  CL 99  CO2 25  GLUCOSE 120*  BUN 9  CREATININE 0.71  CALCIUM 9.2  MG 2.3   GFR: Estimated Creatinine Clearance: 102.1 mL/min (by C-G formula based on SCr of 0.71 mg/dL). Liver Function Tests: Recent Labs  Lab 12/04/20 0735  AST 76*  ALT 102*  ALKPHOS 720*  BILITOT 1.1  PROT 7.9  ALBUMIN 4.8   Recent Labs  Lab 12/04/20 0735  LIPASE 20   No results for input(s): AMMONIA in the last 168 hours. Coagulation Profile: No results for input(s): INR, PROTIME in the last 168 hours. Cardiac Enzymes: No results for input(s): CKTOTAL, CKMB, CKMBINDEX, TROPONINI in the last 168 hours. BNP (last 3 results) No results for input(s): PROBNP in the last 8760 hours. HbA1C: No results for input(s): HGBA1C in the last 72 hours. CBG: No results for input(s): GLUCAP in the last 168 hours. Lipid Profile: No results for input(s): CHOL, HDL, LDLCALC, TRIG, CHOLHDL, LDLDIRECT in the last 72 hours. Thyroid Function Tests: No results for input(s): TSH, T4TOTAL, FREET4, T3FREE, THYROIDAB in the last 72 hours. Anemia Panel: No results for input(s):  VITAMINB12, FOLATE, FERRITIN, TIBC, IRON, RETICCTPCT in the last 72 hours. Urine analysis:    Component Value Date/Time   COLORURINE YELLOW 12/04/2020 1059   APPEARANCEUR CLEAR 12/04/2020 1059   LABSPEC 1.025 12/04/2020 1059   PHURINE 7.0 12/04/2020 1059   GLUCOSEU NEGATIVE 12/04/2020 1059   HGBUR NEGATIVE 12/04/2020 1059   BILIRUBINUR NEGATIVE 12/04/2020 1059   KETONESUR 15 (A) 12/04/2020 1059  PROTEINUR NEGATIVE 12/04/2020 1059   NITRITE NEGATIVE 12/04/2020 1059   LEUKOCYTESUR TRACE (A) 12/04/2020 1059    Radiological Exams on Admission - Personally Reviewed: CT Angio Chest Pulmonary Embolism (PE) W or WO Contrast  Result Date: 12/04/2020 CLINICAL DATA:  Chest pain short of breath EXAM: CT ANGIOGRAPHY CHEST WITH CONTRAST TECHNIQUE: Multidetector CT imaging of the chest was performed using the standard protocol during bolus administration of intravenous contrast. Multiplanar CT image reconstructions and MIPs were obtained to evaluate the vascular anatomy. CONTRAST:  173mL OMNIPAQUE IOHEXOL 350 MG/ML SOLN COMPARISON:  CT 10/18/2020 FINDINGS: Cardiovascular: Satisfactory opacification of the pulmonary arteries to the segmental level. No evidence of pulmonary embolism. Normal heart size. No pericardial effusion. Nonaneurysmal aorta. No dissection is seen. Mediastinum/Nodes: No enlarged mediastinal, hilar, or axillary lymph nodes. Thyroid gland, trachea, and esophagus demonstrate no significant findings. Lungs/Pleura: Emphysema. No acute airspace disease, pleural effusion, or pneumothorax Upper Abdomen: No acute abnormality. Musculoskeletal: Diffuse skeletal metastatic disease as before Review of the MIP images confirms the above findings. IMPRESSION: 1. Negative for acute pulmonary embolus or aortic dissection 2. Emphysema without acute airspace disease 3. Diffuse skeletal metastatic disease Aortic Atherosclerosis (ICD10-I70.0) and Emphysema (ICD10-J43.9). Electronically Signed   By: Donavan Foil  M.D.   On: 12/04/2020 18:45   CT RENAL STONE STUDY  Result Date: 12/04/2020 CLINICAL DATA:  Flank pain since last evening. EXAM: CT ABDOMEN AND PELVIS WITHOUT CONTRAST TECHNIQUE: Multidetector CT imaging of the abdomen and pelvis was performed following the standard protocol without IV contrast. COMPARISON:  CT scan 11/15/2020 FINDINGS: Lower chest: The lung bases are clear of acute process. No pleural effusion or pulmonary lesions. The heart is normal in size. No pericardial effusion. The distal esophagus and aorta are unremarkable. Hepatobiliary: No hepatic lesions or intrahepatic biliary dilatation. The gallbladder is unremarkable. No common bile duct dilatation. Pancreas: Stable 2.7 cm cystic lesion in the pancreatic tail. No acute inflammation or ductal dilatation. Spleen: Normal size. No focal lesions. Adrenals/Urinary Tract: Adrenal glands and kidneys are unremarkable. No renal, ureteral bladder calculi obvious mass without contrast. Stomach/Bowel: The stomach, duodenum, small bowel and colon are grossly normal. Vascular/Lymphatic: The aorta is normal in caliber. No atheroscerlotic calcifications. No mesenteric of retroperitoneal mass or adenopathy. Small scattered lymph nodes are noted. Reproductive: The prostate gland and seminal vesicles unremarkable. Other: No pelvic mass or adenopathy. No free pelvic fluid collections. No inguinal mass or adenopathy. No abdominal wall hernia or subcutaneous lesions. Musculoskeletal: Stable diffuse sclerotic metastatic bone disease related to patient's known prostate cancer. No acute bony findings are identified. IMPRESSION: 1. No acute abdominal/pelvic findings, mass lesions or adenopathy. 2. No renal, ureteral or bladder calculi or obvious mass without contrast. 3. Stable diffuse sclerotic metastatic bone disease related to patient's known prostate cancer. 4. Stable 2.7 cm cystic lesion in the pancreatic tail. Electronically Signed   By: Marijo Sanes M.D.   On:  12/04/2020 07:45   CT Angio Abd/Pel W and/or Wo Contrast  Result Date: 12/04/2020 CLINICAL DATA:  Chest pain short of breath nausea vomiting EXAM: CTA ABDOMEN AND PELVIS WITHOUT AND WITH CONTRAST TECHNIQUE: Multidetector CT imaging of the abdomen and pelvis was performed using the standard protocol during bolus administration of intravenous contrast. Multiplanar reconstructed images and MIPs were obtained and reviewed to evaluate the vascular anatomy. CONTRAST:  151mL OMNIPAQUE IOHEXOL 350 MG/ML SOLN COMPARISON:  CT 12/04/2020, 11/15/2020, 10/18/2020, PET CT 03/03/2017, CT 09/26/2020 FINDINGS: VASCULAR Aorta: Normal caliber aorta without aneurysm, dissection, vasculitis or significant stenosis. Celiac:  Patent without evidence of aneurysm, dissection, vasculitis or significant stenosis. SMA: Patent without evidence of aneurysm, dissection, vasculitis or significant stenosis. Renals: Single right renal artery with mild stenosis about a cm distal to the origin. Two left renal arteries, more cranial accessory artery is patent and without occlusion or stenosis. Dominant main renal artery demonstrates moderate severe focal stenosis of the proximal renal artery about 8 mm distal to the origin, coronal series 18 image 60. IMA: Patent without evidence of aneurysm, dissection, vasculitis or significant stenosis. Inflow: Patent without evidence of aneurysm, dissection, vasculitis or significant stenosis. Proximal Outflow: Bilateral common femoral and visualized portions of the superficial and profunda femoral arteries are patent without evidence of aneurysm, dissection, vasculitis or significant stenosis. Review of the MIP images confirms the above findings. NON-VASCULAR Lower chest: Emphysema.  No acute 1 airspace disease or effusion. Hepatobiliary: No calcified gallstone. No focal hepatic abnormality. Mild intra hepatic biliary dilatation. Common duct diameter up to 8 mm. Pancreas: No inflammation. 2.9 cm cyst in the  pancreatic tail no change. Spleen: Normal in size without focal abnormality. Adrenals/Urinary Tract: Adrenal glands are unremarkable. Kidneys are normal, without renal calculi, focal lesion, or hydronephrosis. Bladder is unremarkable. Stomach/Bowel: Stomach is within normal limits. Appendix not well seen but no right lower quadrant inflammatory process. No evidence of bowel wall thickening, distention, or inflammatory changes. Negative for intramural air. Liquid stools in the colon. Lymphatic: No suspicious lymph nodes Reproductive: Negative prostate Other: Negative for free air. Trace fluid in the right upper quadrant adjacent to the inferior margin of the right hepatic lobe Musculoskeletal: Diffuse skeletal sclerotic metastatic disease as noted on previous exams. IMPRESSION: VASCULAR 1. Negative for aortic dissection or acute occlusive disease. 2. Mild right stenosis of proximal right renal artery. Moderate to marked focal stenosis of dominant proximal left renal artery. NON-VASCULAR 1. No CT evidence for acute intra-abdominal or pelvic abnormality. 2. Mild intra and extrahepatic biliary dilatation which may be correlated with LFTs with follow-up MR or ERCP is indicated 3. Stable sclerotic metastatic disease 4. Stable cystic lesion in the pancreatic tail Electronically Signed   By: Donavan Foil M.D.   On: 12/04/2020 18:39   US Abdomen Limited RUQ (LIVER/GB)  Result Date: 12/04/2020 CLINICAL DATA:  Right upper quadrant pain. EXAM: ULTRASOUND ABDOMEN LIMITED RIGHT UPPER QUADRANT COMPARISON:  CT abdomen pelvis from same day. FINDINGS: Gallbladder: No gallstones or wall thickening visualized. No sonographic Murphy sign noted by sonographer. Common bile duct: Diameter: 5 mm, normal. Liver: Mild central intrahepatic biliary dilatation. No focal lesion identified. Within normal limits in parenchymal echogenicity. Portal vein is patent on color Doppler imaging with normal direction of blood flow towards the liver.  Other: None. IMPRESSION: 1. Mild central intrahepatic biliary dilatation without common bile duct dilatation. Consider further evaluation with ERCP or MRCP. Electronically Signed   By: Titus Dubin M.D.   On: 12/04/2020 11:11     Assessment/Plan  * Intractable abdominal pain Ongoing substantial abdominal pain. While patient does have a history of chronic pain, appearance of dilation of the biliary tree in the setting of elevated transaminases could be a new hepatobiliary process and therefore prove to be the source of patient's substantial abdominal and back discomfort. MRCP ordered As needed opiate-based analgesics for substantial pain Transitioning from home p.o. PPI to IV Protonix while here As needed antiemetics Intravenous volume resuscitation with isotonic fluids Based on MRCP results we will consider gastroenterology consultation  Dilation of biliary tract Please see assessment and plan above  Abnormal LFTs While elevated transaminases could prove to be a new hepatobiliary process, markedly elevated alkaline phosphatase could additionally be secondary to bony destruction from diffuse metastases Remainder of assessment and plan as above  Hypokalemia due to excessive gastrointestinal loss of potassium Replacing with potassium chloride Evaluating for concurrent hypomagnesemia  Monitoring potassium levels with serial chemistries.   Prostate cancer metastatic to multiple sites [facility]) Diagnosed 03/2016 Well-documented history of extensive metastatic disease to multiple bony sites Actively receiving Lupron, Zometa and seem to be initiated on Pluvicto  Follows with Dr. Marin Olp with oncology Continue outpatient follow-up after discharge  AF (paroxysmal atrial fibrillation) ([place]) Currently rate controlled. On aspirin monotherapy for anticoagulation in the outpatient setting due to low CHA2DS2-VASc score, will continue Continue home regimen of metoprolol Monitoring on  telemetry   GERD without esophagitis Transitioning from oral PPI to intravenous Protonix for now     Code Status:  Full code  code status decision has been confirmed with: patient Family Communication: deferred   Status is: Observation  The patient remains OBS appropriate and will d/c before 2 midnights.       Vernelle Emerald MD [facility] Pager [phone]  If 7PM-7AM, please contact night-coverage [email or web address] Use universal [place] password for that web site. If you do not have the password, please call the hospital operator.  12/04/2020, 10:53 PM

## 2020-12-04 NOTE — ED Provider Notes (Addendum)
[facility] EMERGENCY DEPARTMENT Provider Note   CSN: [id number] Arrival date & time: 12/04/20  4742     History Chief Complaint  Patient presents with   Abdominal Pain   Back Pain    DEVONTAY CELAYA is a 60 y.o. male.   Abdominal Pain Associated symptoms: diarrhea and vomiting   Associated symptoms: no chest pain, no chills, no cough, no dysuria, no fever, no hematuria, no shortness of breath and no sore throat   Back Pain Associated symptoms: abdominal pain   Associated symptoms: no chest pain, no dysuria and no fever    60 year old male presenting to the emergency department with sudden onset abdominal pain radiating to his back.  He endorses right-sided and generalized abdominal pain radiating to his right flank.  He endorses mild nausea, some NBNB emesis.  He has been unable to tolerate oral intake.  The abdominal pain is sharp, moderate in intensity and came on suddenly.  No aggravating or alleviating factors.  He denies a history of kidney stones.  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    Patient Active Problem List   Diagnosis Date Noted   Intractable abdominal pain 12/04/2020   Abdominal pain 10/18/2020   Goals of care, counseling/discussion 06/24/2017   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   Atrial fibrillation ([place]) 03/30/2016   Back pain without radiculopathy 03/28/2016   Atrial fibrillation with RVR ([place]) 03/28/2016     Past Surgical History:  Procedure Laterality Date   IR IMAGING GUIDED PORT INSERTION  09/05/2020   MULTIPLE EXTRACTIONS WITH ALVEOLOPLASTY N/A 11/19/2016   Procedure: EXTRACTION OF TOOTH #'S 1,3,5-7,9-17, AND 20- 29 WITH ALVEOLOPLASTY, BILATERAL MANDIBULAR TORI REDUCTIONS AND BILATERAL MAXILLARY BUCCAL EXOSTOSES REDUCTIONS;  Surgeon: Lenn Cal, DDS;  Location: [facility] ORS;  Service: Oral Surgery;  Laterality: N/A;   NO PAST SURGERIES         Family History  Problem Relation Age of Onset   Stroke Sister    Lupus Mother    Cancer Neg Hx    Heart disease Neg Hx    Diabetes Neg Hx     Social History   Tobacco Use   Smoking status: Former    Packs/day: 0.50    Years: 39.00    Pack years: 19.50    Types: Cigarettes    Quit date: 04/30/2020    Years since quitting: 0.5   Smokeless tobacco: Never  Vaping Use   Vaping Use: Never used  Substance Use Topics   Alcohol use: Not Currently   Drug use: No    Home Medications Prior to Admission medications   Medication Sig Start Date End Date Taking? Authorizing Provider  morphine (MSIR) 30 MG tablet Take 1 tablet (30 mg total) by mouth every 4 (four) hours as needed for severe pain. 11/12/20  Yes [name], MD  aspirin 81 MG chewable tablet Chew 81 mg by mouth  daily. 04/09/17   [provider]  cyclobenzaprine (FLEXERIL) 10 MG tablet Take 1 tablet (10 mg total) by mouth 3 (three) times daily. 11/16/20   [name], MD  famotidine (PEPCID) 40 MG tablet Take 1 tablet (40 mg total) by mouth 2 (two) times daily. 09/27/20   [name], MD  fentaNYL (DURAGESIC) 100 MCG/HR Place 2 patches onto the skin every other day. 11/12/20   [name], MD  gabapentin (NEURONTIN) 800 MG tablet Take 800 mg by mouth 3 (three) times daily. May take an additional 458m as needed for pain    [provider]  metoprolol tartrate (LOPRESSOR) 25 MG tablet TAKE 1/2 TABLET(12.5 MG) BY MOUTH TWICE DAILY Patient taking  differently: Take 12.5 mg by mouth 2 (two) times daily. 09/24/20   EVolanda Napoleon MD  Naloxone HCl 0.4 MG/0.4ML SOAJ Use in the event of accidental overdose of opioids. Patient not taking: No sig reported 08/26/20   WArnaldo Natal MD  Oxycodone HCl 20 MG TABS Take 1 tablet (20 mg total) by mouth every 4 (four) hours as needed for up to 120 doses. 11/30/20   EVolanda Napoleon MD  pantoprazole (PROTONIX) 40 MG tablet Take 1 tablet (40 mg total) by mouth daily. 10/21/20 10/21/21  AShelly Coss MD  Plecanatide (TRULANCE) 3 MG TABS Take by mouth. 10/24/20   [provider]  polyethylene glycol (MIRALAX / GLYCOLAX) 17 g packet Take 17 g by mouth 2 (two) times daily. 10/21/20   AShelly Coss MD  senna (SENOKOT) 8.6 MG TABS tablet Take 2 tablets (17.2 mg total) by mouth 2 (two) times daily. 10/21/20   AShelly Coss MD  tamsulosin (FLOMAX) 0.4 MG CAPS capsule Take 1 capsule (0.4 mg total) by mouth daily. 10/21/20   AShelly Coss MD  prochlorperazine (COMPAZINE) 10 MG tablet Take 1 tablet (10 mg total) by mouth every 6 (six) hours as needed (Nausea or vomiting). Patient not taking: Reported on 11/12/2020 09/06/20 11/12/20  EVolanda Napoleon MD    Allergies    Contrast media [iodinated diagnostic agents] and Other  Review of Systems   Review of Systems  Constitutional:  Negative for chills and fever.  HENT:  Negative for ear pain and sore throat.   Eyes:  Negative for pain and visual disturbance.  Respiratory:  Negative for cough and shortness of breath.   Cardiovascular:  Negative for chest pain and palpitations.  Gastrointestinal:  Positive for abdominal pain, diarrhea and vomiting.  Genitourinary:  Negative for dysuria and hematuria.  Musculoskeletal:  Positive for back pain. Negative for arthralgias.  Skin:  Negative for color change and rash.  Neurological:  Negative for seizures and syncope.  All other systems reviewed and are negative.  Physical Exam Updated Vital Signs BP  (!) 158/74 (BP Location: Left Arm)   Pulse 80   Temp 98.9 F (37.2 C) (Oral)   Resp 18   Ht _0  (1.88 m)   Wt 72.6 kg   SpO2 99%   BMI 20.54 kg/m   Physical Exam Vitals and nursing note reviewed.  Constitutional:      Appearance: He is well-developed.     Comments: Uncomfortable appearing  HENT:     Head: Normocephalic and atraumatic.  Eyes:     Conjunctiva/sclera: Conjunctivae normal.  Cardiovascular:     Rate and Rhythm: Normal rate and regular rhythm.     Heart sounds: No murmur heard. Pulmonary:     Effort: Pulmonary effort is normal.  No respiratory distress.     Breath sounds: Normal breath sounds.  Abdominal:     Palpations: Abdomen is soft.     Tenderness: There is generalized abdominal tenderness. There is guarding. There is no right CVA tenderness, left CVA tenderness or rebound.  Musculoskeletal:     Cervical back: Neck supple.  Skin:    General: Skin is warm and dry.  Neurological:     Mental Status: He is alert.    ED Results / Procedures / Treatments   Labs (all labs ordered are listed, but only abnormal results are displayed) Labs Reviewed  COMPREHENSIVE METABOLIC PANEL - Abnormal; Notable for the following components:      Result Value   Potassium 3.3 (*)    Glucose, Bld 120 (*)    AST 76 (*)    ALT 102 (*)    Alkaline Phosphatase 720 (*)    All other components within normal limits  URINALYSIS, ROUTINE W REFLEX MICROSCOPIC - Abnormal; Notable for the following components:   Ketones, ur 15 (*)    Leukocytes,Ua TRACE (*)    All other components within normal limits  CBC WITH DIFFERENTIAL/PLATELET - Abnormal; Notable for the following components:   Hemoglobin 12.7 (*)    HCT 38.1 (*)    RDW 17.2 (*)    Abs Immature Granulocytes 0.45 (*)    All other components within normal limits  URINALYSIS, MICROSCOPIC (REFLEX) - Abnormal; Notable for the following components:   Bacteria, UA MANY (*)    All other components within normal limits  RESP  PANEL BY RT-PCR (FLU A&B, COVID) ARPGX2  LIPASE, BLOOD  MAGNESIUM  CBC WITH DIFFERENTIAL/PLATELET    EKG None  Radiology CT RENAL STONE STUDY  Result Date: 12/04/2020 CLINICAL DATA:  Flank pain since last evening. EXAM: CT ABDOMEN AND PELVIS WITHOUT CONTRAST TECHNIQUE: Multidetector CT imaging of the abdomen and pelvis was performed following the standard protocol without IV contrast. COMPARISON:  CT scan 11/15/2020 FINDINGS: Lower chest: The lung bases are clear of acute process. No pleural effusion or pulmonary lesions. The heart is normal in size. No pericardial effusion. The distal esophagus and aorta are unremarkable. Hepatobiliary: No hepatic lesions or intrahepatic biliary dilatation. The gallbladder is unremarkable. No common bile duct dilatation. Pancreas: Stable 2.7 cm cystic lesion in the pancreatic tail. No acute inflammation or ductal dilatation. Spleen: Normal size. No focal lesions. Adrenals/Urinary Tract: Adrenal glands and kidneys are unremarkable. No renal, ureteral bladder calculi obvious mass without contrast. Stomach/Bowel: The stomach, duodenum, small bowel and colon are grossly normal. Vascular/Lymphatic: The aorta is normal in caliber. No atheroscerlotic calcifications. No mesenteric of retroperitoneal mass or adenopathy. Small scattered lymph nodes are noted. Reproductive: The prostate gland and seminal vesicles unremarkable. Other: No pelvic mass or adenopathy. No free pelvic fluid collections. No inguinal mass or adenopathy. No abdominal wall hernia or subcutaneous lesions. Musculoskeletal: Stable diffuse sclerotic metastatic bone disease related to patient's known prostate cancer. No acute bony findings are identified. IMPRESSION: 1. No acute abdominal/pelvic findings, mass lesions or adenopathy. 2. No renal, ureteral or bladder calculi or obvious mass without contrast. 3. Stable diffuse sclerotic metastatic bone disease related to patient's known prostate cancer. 4. Stable  2.7 cm cystic lesion in the pancreatic tail. Electronically Signed   By: Marijo Sanes M.D.   On: 12/04/2020 07:45   US Abdomen Limited RUQ (LIVER/GB)  Result Date: 12/04/2020 CLINICAL DATA:  Right upper quadrant pain. EXAM: ULTRASOUND ABDOMEN LIMITED RIGHT UPPER QUADRANT COMPARISON:  CT abdomen  pelvis from same day. FINDINGS: Gallbladder: No gallstones or wall thickening visualized. No sonographic Murphy sign noted by sonographer. Common bile duct: Diameter: 5 mm, normal. Liver: Mild central intrahepatic biliary dilatation. No focal lesion identified. Within normal limits in parenchymal echogenicity. Portal vein is patent on color Doppler imaging with normal direction of blood flow towards the liver. Other: None. IMPRESSION: 1. Mild central intrahepatic biliary dilatation without common bile duct dilatation. Consider further evaluation with ERCP or MRCP. Electronically Signed   By: Titus Dubin M.D.   On: 12/04/2020 11:11    Procedures Procedures   Medications Ordered in ED Medications  potassium chloride SA (KLOR-CON) CR tablet 40 mEq (40 mEq Oral Not Given 12/04/20 0910)  HYDROmorphone (DILAUDID) injection 1 mg (has no administration in time range)  ketorolac (TORADOL) 15 MG/ML injection 15 mg (15 mg Intravenous Given 12/04/20 0750)  lactated ringers bolus 1,000 mL (0 mLs Intravenous Stopped 12/04/20 0923)  ondansetron (ZOFRAN) injection 4 mg (4 mg Intravenous Given 12/04/20 0750)  HYDROmorphone (DILAUDID) injection 1 mg (1 mg Intravenous Given 12/04/20 0856)  HYDROmorphone (DILAUDID) injection 1 mg (1 mg Intravenous Given 12/04/20 1125)  diphenhydrAMINE (BENADRYL) capsule 50 mg ( Oral See Alternative 12/04/20 1550)    Or  diphenhydrAMINE (BENADRYL) injection 50 mg (50 mg Intravenous Given 12/04/20 1550)  hydrocortisone sodium succinate (SOLU-CORTEF) 100 MG injection 200 mg (200 mg Intravenous Given 12/04/20 1330)  iohexol (OMNIPAQUE) 350 MG/ML injection 100 mL (100 mLs Intravenous Contrast Given  12/04/20 1641)    ED Course  I have reviewed the triage vital signs and the nursing notes.  Pertinent labs & imaging results that were available during my care of the patient were reviewed by me and considered in my medical decision making (see chart for details).    MDM Rules/Calculators/A&P                           59 year old male presenting to the emergency department with sudden onset abdominal pain radiating to his back.  He endorses right-sided and generalized abdominal pain radiating to his right flank.  He endorses mild nausea, some NBNB emesis.  He has been unable to tolerate oral intake.  The abdominal pain is sharp, moderate in intensity and came on suddenly.  No aggravating or alleviating factors.  He denies a history of kidney stones.  Pertinent exam findings include:Diffuse generalized abdominal tenderness to palpation with guarding. Medical record reviewed and significant for metastatic prostate cancer.  Lab results included: No leukocytosis, anemia, or platelet abnormality. UA with many bacteria, 6-10 WBCs, trace leukocytes, negative nitrites. Lipase and Mag normal. CMP with mild hypokalemia to 3.3, replenished orally. Mildly elevated AST to 76, ALT to 102, Alk Phos elevated to 720 which would be expected in prostate cancer. T bili normal.   Imaging results include:RUQ US revealed mildly dilated intrahepatic veins with a normal common bile duct. CT Stone study without etiology of the patient's pain.  Course of tx has consisted of: Dilaudid, IVF bolus, Malox and Lidocaine.  With presenting history and physical exam, considered Bowel Obstruction, AAA, Pyelonephritis, Nephrolithiasis, Pancreatitis, Cholecystitis, Shingles, Perforated Bowel or Ulcer, Diverticulosis/itis, Ischemic Mesentery, Inflammatory Bowel Disease, Strangulated/Incarcerated Hernia, gastritis, PUD. Patient without peritoneal signs or other indication of need for surgical intervention.  On reassessment, patient  remains in severe pain despite the above interventions. Unclear etiology. Patient with a known contrast allergy and requires premedication. Lactic acid ordered.  Some biliary dilatation noted on ultrasound.  Pt  vomiting and unable to tolerate PO.  Given uncontrolled pain, hospitalist medicine consulted for admission. I did speak to [facility] GI on-call who stated that the patient could benefit from MRCP while inpatient.  Communicated this to inpatient hospitalist medicine.  CTA abdomen and pelvis to evaluate for possible mesenteric ischemia or aortic pathology. Abdomen soft, non-peritonitic on repeat assessments. Admitted in stable condition.  Final Clinical Impression(s) / ED Diagnoses Final diagnoses:  Abdominal pain    Rx / DC Orders ED Discharge Orders     None        Regan Lemming, MD 12/04/20 1658    Regan Lemming, MD 12/04/20 2004

## 2020-12-05 ENCOUNTER — Inpatient Hospital Stay (HOSPITAL_COMMUNITY): Payer: Medicare Other

## 2020-12-05 DIAGNOSIS — K219 Gastro-esophageal reflux disease without esophagitis: Secondary | ICD-10-CM | POA: Diagnosis present

## 2020-12-05 DIAGNOSIS — M549 Dorsalgia, unspecified: Secondary | ICD-10-CM

## 2020-12-05 DIAGNOSIS — Z87891 Personal history of nicotine dependence: Secondary | ICD-10-CM | POA: Diagnosis not present

## 2020-12-05 DIAGNOSIS — Z20822 Contact with and (suspected) exposure to covid-19: Secondary | ICD-10-CM | POA: Diagnosis present

## 2020-12-05 DIAGNOSIS — R109 Unspecified abdominal pain: Secondary | ICD-10-CM | POA: Diagnosis present

## 2020-12-05 DIAGNOSIS — Z515 Encounter for palliative care: Secondary | ICD-10-CM | POA: Diagnosis not present

## 2020-12-05 DIAGNOSIS — Z91041 Radiographic dye allergy status: Secondary | ICD-10-CM | POA: Diagnosis not present

## 2020-12-05 DIAGNOSIS — R52 Pain, unspecified: Secondary | ICD-10-CM | POA: Diagnosis not present

## 2020-12-05 DIAGNOSIS — M546 Pain in thoracic spine: Secondary | ICD-10-CM | POA: Diagnosis present

## 2020-12-05 DIAGNOSIS — C7951 Secondary malignant neoplasm of bone: Secondary | ICD-10-CM

## 2020-12-05 DIAGNOSIS — R1084 Generalized abdominal pain: Secondary | ICD-10-CM | POA: Diagnosis not present

## 2020-12-05 DIAGNOSIS — Z7982 Long term (current) use of aspirin: Secondary | ICD-10-CM | POA: Diagnosis not present

## 2020-12-05 DIAGNOSIS — Z800 Family history of malignant neoplasm of digestive organs: Secondary | ICD-10-CM | POA: Diagnosis not present

## 2020-12-05 DIAGNOSIS — C61 Malignant neoplasm of prostate: Secondary | ICD-10-CM

## 2020-12-05 DIAGNOSIS — I480 Paroxysmal atrial fibrillation: Secondary | ICD-10-CM | POA: Diagnosis present

## 2020-12-05 DIAGNOSIS — R7989 Other specified abnormal findings of blood chemistry: Secondary | ICD-10-CM | POA: Diagnosis not present

## 2020-12-05 DIAGNOSIS — Z823 Family history of stroke: Secondary | ICD-10-CM | POA: Diagnosis not present

## 2020-12-05 DIAGNOSIS — Z832 Family history of diseases of the blood and blood-forming organs and certain disorders involving the immune mechanism: Secondary | ICD-10-CM | POA: Diagnosis not present

## 2020-12-05 DIAGNOSIS — R748 Abnormal levels of other serum enzymes: Secondary | ICD-10-CM

## 2020-12-05 DIAGNOSIS — Z79899 Other long term (current) drug therapy: Secondary | ICD-10-CM | POA: Diagnosis not present

## 2020-12-05 DIAGNOSIS — G893 Neoplasm related pain (acute) (chronic): Secondary | ICD-10-CM | POA: Diagnosis present

## 2020-12-05 DIAGNOSIS — Z7189 Other specified counseling: Secondary | ICD-10-CM | POA: Diagnosis not present

## 2020-12-05 DIAGNOSIS — G894 Chronic pain syndrome: Secondary | ICD-10-CM | POA: Diagnosis present

## 2020-12-05 DIAGNOSIS — R6881 Early satiety: Secondary | ICD-10-CM | POA: Diagnosis present

## 2020-12-05 DIAGNOSIS — E876 Hypokalemia: Secondary | ICD-10-CM | POA: Diagnosis present

## 2020-12-05 LAB — CBC WITH DIFFERENTIAL/PLATELET
Abs Immature Granulocytes: 0.16 10*3/uL — ABNORMAL HIGH (ref 0.00–0.07)
Basophils Absolute: 0 10*3/uL (ref 0.0–0.1)
Basophils Relative: 0 %
Eosinophils Absolute: 0 10*3/uL (ref 0.0–0.5)
Eosinophils Relative: 0 %
HCT: 33.6 % — ABNORMAL LOW (ref 39.0–52.0)
Hemoglobin: 11.2 g/dL — ABNORMAL LOW (ref 13.0–17.0)
Immature Granulocytes: 2 %
Lymphocytes Relative: 21 %
Lymphs Abs: 2.2 10*3/uL (ref 0.7–4.0)
MCH: 29.6 pg (ref 26.0–34.0)
MCHC: 33.3 g/dL (ref 30.0–36.0)
MCV: 88.9 fL (ref 80.0–100.0)
Monocytes Absolute: 1.1 10*3/uL — ABNORMAL HIGH (ref 0.1–1.0)
Monocytes Relative: 11 %
Neutro Abs: 6.7 10*3/uL (ref 1.7–7.7)
Neutrophils Relative %: 66 %
Platelets: 151 10*3/uL (ref 150–400)
RBC: 3.78 MIL/uL — ABNORMAL LOW (ref 4.22–5.81)
RDW: 17.2 % — ABNORMAL HIGH (ref 11.5–15.5)
WBC: 10.2 10*3/uL (ref 4.0–10.5)
nRBC: 0 % (ref 0.0–0.2)

## 2020-12-05 LAB — BASIC METABOLIC PANEL
Anion gap: 6 (ref 5–15)
BUN: 10 mg/dL (ref 6–20)
CO2: 28 mmol/L (ref 22–32)
Calcium: 7.9 mg/dL — ABNORMAL LOW (ref 8.9–10.3)
Chloride: 104 mmol/L (ref 98–111)
Creatinine, Ser: 0.58 mg/dL — ABNORMAL LOW (ref 0.61–1.24)
GFR, Estimated: >60 mL/min (ref >60)
Glucose, Bld: 98 mg/dL (ref 70–99)
Potassium: 4 mmol/L (ref 3.5–5.1)
Sodium: 138 mmol/L (ref 135–145)

## 2020-12-05 LAB — HEPATIC FUNCTION PANEL
ALT: 66 U/L — ABNORMAL HIGH (ref 0–44)
AST: 31 U/L (ref 15–41)
Albumin: 3.8 g/dL (ref 3.5–5.0)
Alkaline Phosphatase: 577 U/L — ABNORMAL HIGH (ref 38–126)
Bilirubin, Direct: 0.2 mg/dL (ref 0.0–0.2)
Indirect Bilirubin: 0.7 mg/dL (ref 0.3–0.9)
Total Bilirubin: 0.9 mg/dL (ref 0.3–1.2)
Total Protein: 6.2 g/dL — ABNORMAL LOW (ref 6.5–8.1)

## 2020-12-05 LAB — MAGNESIUM: Magnesium: 2.4 mg/dL (ref 1.7–2.4)

## 2020-12-05 MED ORDER — GADOBUTROL 1 MMOL/ML IV SOLN
8.0000 mL | Freq: Once | INTRAVENOUS | Status: AC | PRN
  Administered 2020-12-05: 8 mL via INTRAVENOUS

## 2020-12-05 MED ORDER — PIPERACILLIN-TAZOBACTAM 3.375 G IVPB
3.3750 g | Freq: Three times a day (TID) | INTRAVENOUS | Status: DC
  Administered 2020-12-05 – 2020-12-10 (×16): 3.375 g via INTRAVENOUS
  Filled 2020-12-05 (×17): qty 50

## 2020-12-05 MED ORDER — MORPHINE SULFATE 15 MG PO TABS
30.0000 mg | ORAL_TABLET | ORAL | Status: DC | PRN
  Administered 2020-12-06 (×2): 30 mg via ORAL
  Filled 2020-12-05 (×2): qty 2

## 2020-12-05 MED ORDER — CHLORHEXIDINE GLUCONATE CLOTH 2 % EX PADS
6.0000 | MEDICATED_PAD | Freq: Every day | CUTANEOUS | Status: DC
  Administered 2020-12-05 – 2020-12-09 (×5): 6 via TOPICAL

## 2020-12-05 MED ORDER — SUCRALFATE 1 G PO TABS
1.0000 g | ORAL_TABLET | Freq: Three times a day (TID) | ORAL | Status: DC
  Administered 2020-12-05 – 2020-12-10 (×19): 1 g via ORAL
  Filled 2020-12-05 (×19): qty 1

## 2020-12-05 MED ORDER — PANTOPRAZOLE 80MG IVPB - SIMPLE MED: 80.0000 mg | Freq: Two times a day (BID) | INTRAVENOUS | Status: DC

## 2020-12-05 MED ORDER — FENTANYL 100 MCG/HR TD PT72
2.0000 | MEDICATED_PATCH | TRANSDERMAL | Status: DC
  Administered 2020-12-05 – 2020-12-07 (×2): 2 via TRANSDERMAL
  Filled 2020-12-05 (×2): qty 2

## 2020-12-05 MED ORDER — SODIUM CHLORIDE 0.9% FLUSH: 10.0000 mL | INTRAVENOUS | Status: DC | PRN

## 2020-12-05 MED ORDER — PANTOPRAZOLE SODIUM 40 MG PO TBEC
40.0000 mg | DELAYED_RELEASE_TABLET | Freq: Two times a day (BID) | ORAL | Status: DC
  Administered 2020-12-05 – 2020-12-10 (×11): 40 mg via ORAL
  Filled 2020-12-05 (×11): qty 1

## 2020-12-05 NOTE — Consult Note (Signed)
Referring Provider: Dr. Burney Gauze Primary Care Physician:  Robert Bellow, PA-C Primary Gastroenterologist:  [place]  Reason for Consultation:  Epigastric pain, Elevated liver enzymes  HPI: Ryan Davis is a 60 y.o. male with metastatic prostate cancer who presented to [facility] with a 24 hr Hx of severe epigastric pain after eating dinner. Elevated transaminases with elevated alk phos (which could be to his bony mets) but when a CT abd was done it showed biliary dilation. ER provider transferred patient to hospitalist service at [facility] for MRCP. Now MRCP is complete and it reveals: mild intrahepatic ductal dilation with delayed peribiliary enhancement. Radiology says it may reflect cholangitis or cholangiocarcinoma. Patient has been started on Zosyn.   Patient has been having worsening abdominal pain for the last month, and pain became severe yesterday.  He states it is worst around his navel and radiates around his sides to the back.  Patient says he swallowed food yesterday, felt like it was stuck, and forced himself to vomit undigested food. Patient has not had episodes of dysphagia prior to this. Denies hematemesis, coffee-ground emesis. Otherwise has not had any episodes of nausea and vomiting, no fever or chills.  He denies a history of GERD, but was placed on Protonix and Pepcid 1 month ago for abdominal pain caused by chemotherapeutic agents.   Patient has been taking Aleve, 2 pills every 4 hours for the last 3 weeks.  Denies other NSAID use.  Denies blood thinners.  He does take 81 mg aspirin daily.  Patient has been having early satiety, bloating, reflux for the last month.  Denies irregular bowel habits, last bowel movement was yesterday, with soft and formed.  Patient denies melena, hematochezia.  He is on Colace, MiraLAX, fiber.  Only history is positive for colorectal cancer in his brother.  He denies any other family history of GI cancers, gallbladder disease.   Patient still has his gallbladder.  Patient had colonoscopy 03/01/2020 with Coleman - 1 benign appearing polyp.    Past Medical History:  Diagnosis Date   A-fib [facility])    see 03-28-16 admission   Back pain without radiculopathy 03/28/2016   Dyspnea    endorses since treatment radiation , still gets SOB occasionally    Dysrhythmia    afib    Goals of care, counseling/discussion 06/24/2017   History of radiation therapy 09/16/16-10/06/16   35 Gy in 14 fractions to the lumbar spine   Nocturia    Prostate cancer metastatic to bone ([place]) 03/31/2016   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[place]) 03/31/2016    Past Surgical History:  Procedure Laterality Date   IR IMAGING GUIDED PORT INSERTION  09/05/2020   MULTIPLE EXTRACTIONS WITH ALVEOLOPLASTY N/A 11/19/2016   Procedure: EXTRACTION OF TOOTH #'S 1,3,5-7,9-17, AND 20- 29 WITH ALVEOLOPLASTY, BILATERAL MANDIBULAR TORI REDUCTIONS AND BILATERAL MAXILLARY BUCCAL EXOSTOSES REDUCTIONS;  Surgeon: Lenn Cal, DDS;  Location: [facility] ORS;  Service: Oral Surgery;  Laterality: N/A;   NO PAST SURGERIES      Prior to Admission medications   Medication Sig Start Date End Date Taking? Authorizing Provider  morphine (MSIR) 30 MG tablet Take 1 tablet (30 mg total) by mouth every 4 (four) hours as needed for severe pain. 11/12/20  Yes [name], MD  aspirin 81 MG chewable tablet Chew 81 mg by mouth daily. 04/09/17   [provider]  cyclobenzaprine (FLEXERIL) 10 MG tablet Take 1 tablet (10 mg total) by  mouth 3 (three) times daily. 11/16/20   [name], MD  famotidine (PEPCID) 40 MG tablet Take 1 tablet (40 mg total) by mouth 2 (two) times daily. 09/27/20   [name], MD  fentaNYL (DURAGESIC) 100 MCG/HR Place 2 patches onto the skin every other day. 11/12/20   [name], MD  gabapentin (NEURONTIN) 800 MG tablet Take 800 mg by mouth 3 (three) times daily. May take an additional 449m as needed for pain     [provider]  metoprolol tartrate (LOPRESSOR) 25 MG tablet TAKE 1/2 TABLET(12.5 MG) BY MOUTH TWICE DAILY Patient taking differently: Take 12.5 mg by mouth 2 (two) times daily. 09/24/20   EVolanda Napoleon MD  Naloxone HCl 0.4 MG/0.4ML SOAJ Use in the event of accidental overdose of opioids. Patient not taking: No sig reported 08/26/20   WArnaldo Natal MD  Oxycodone HCl 20 MG TABS Take 1 tablet (20 mg total) by mouth every 4 (four) hours as needed for up to 120 doses. 11/30/20   EVolanda Napoleon MD  pantoprazole (PROTONIX) 40 MG tablet Take 1 tablet (40 mg total) by mouth daily. 10/21/20 10/21/21  AShelly Coss MD  Plecanatide (TRULANCE) 3 MG TABS Take by mouth. 10/24/20   [provider]  polyethylene glycol (MIRALAX / GLYCOLAX) 17 g packet Take 17 g by mouth 2 (two) times daily. 10/21/20   AShelly Coss MD  senna (SENOKOT) 8.6 MG TABS tablet Take 2 tablets (17.2 mg total) by mouth 2 (two) times daily. 10/21/20   AShelly Coss MD  tamsulosin (FLOMAX) 0.4 MG CAPS capsule Take 1 capsule (0.4 mg total) by mouth daily. 10/21/20   AShelly Coss MD  prochlorperazine (COMPAZINE) 10 MG tablet Take 1 tablet (10 mg total) by mouth every 6 (six) hours as needed (Nausea or vomiting). Patient not taking: Reported on 11/12/2020 09/06/20 11/12/20  EVolanda Napoleon MD    Scheduled Meds:  aspirin  81 mg Oral Daily   Chlorhexidine Gluconate Cloth  6 each Topical Daily   enoxaparin (LOVENOX) injection  40 mg Subcutaneous Q24H   fentaNYL  2 patch Transdermal Q48H   gabapentin  800 mg Oral TID   metoprolol tartrate  12.5 mg Oral BID   pantoprazole  40 mg Oral BID   Plecanatide  3 mg Oral Daily   polyethylene glycol  17 g Oral BID   potassium chloride  40 mEq Oral Once   senna  2 tablet Oral BID   tamsulosin  0.4 mg Oral Daily   Continuous Infusions:  lactated ringers 125 mL/hr at 12/05/20 0040   piperacillin-tazobactam (ZOSYN)  IV 3.375 g (12/05/20 0514)   PRN  Meds:.acetaminophen **OR** acetaminophen, cyclobenzaprine, oxyCODONE **OR** HYDROmorphone (DILAUDID) injection, melatonin, ondansetron **OR** ondansetron (ZOFRAN) IV, polyethylene glycol, sodium chloride flush  Allergies as of 12/04/2020 - Review Complete 12/04/2020  Allergen Reaction Noted   Contrast media [iodinated diagnostic agents] Nausea And Vomiting 07/29/2017   Other Nausea And Vomiting 07/29/2017    Family History  Problem Relation Age of Onset   Stroke Sister    Lupus Mother    Cancer Neg Hx    Heart disease Neg Hx    Diabetes Neg Hx     Social History   Socioeconomic History   Marital status: Married    Spouse name: Not on file   Number of children: Not on file   Years of education: Not on file   Highest education level: Not on file  Occupational History  Not on file  Tobacco Use   Smoking status: Former    Packs/day: 0.50    Years: 39.00    Pack years: 19.50    Types: Cigarettes    Quit date: 04/30/2020    Years since quitting: 0.6   Smokeless tobacco: Never  Vaping Use   Vaping Use: Never used  Substance and Sexual Activity   Alcohol use: Not Currently   Drug use: No   Sexual activity: Not on file  Other Topics Concern   Not on file  Social History Narrative   Not on file   Social Determinants of Health   Financial Resource Strain: Not on file  Food Insecurity: Not on file  Transportation Needs: Not on file  Physical Activity: Not on file  Stress: Not on file  Social Connections: Not on file  Intimate Partner Violence: Not on file    Review of Systems:  Review of Systems  Constitutional:  Negative for chills, fever and weight loss.  Respiratory:  Negative for cough and shortness of breath.   Cardiovascular:  Negative for chest pain and leg swelling.  Gastrointestinal:  Positive for abdominal pain, nausea and vomiting. Negative for blood in stool, constipation, diarrhea, heartburn and melena.  Musculoskeletal:  Positive for back pain.  Negative for falls.  Neurological:  Negative for loss of consciousness.  Psychiatric/Behavioral:  Negative for memory loss.    Physical Exam: Vital signs: Vitals:   12/05/20 0052 12/05/20 0607  BP: 134/89 118/80  Pulse: 87 84  Resp: 19 17  Temp: 98.4 F (36.9 C) 99 F (37.2 C)  SpO2: 100% 100%   Last BM Date: 12/03/20 General:   Alert, skinny male in NAD but appears uncomfortable Heart:  Regular rate and rhythm; no murmurs Pulm: Clear anteriorly; no wheezing Abdomen:  Soft, Flat AB, skin exam normal, Normal bowel sounds. mild tenderness in the entire abdomen. Without guarding and Without rebound, without organomegaly. Extremities:  Without edema, pain with change in positions Neurologic:  Alert and  oriented x4;  grossly normal neurologically. Psych:  Alert and cooperative. Normal mood and affect.  GI:  Lab Results: Recent Labs    12/04/20 0745 12/05/20 0445  WBC 9.9 10.2  HGB 12.7* 11.2*  HCT 38.1* 33.6*  PLT 183 151   BMET Recent Labs    12/04/20 0735 12/05/20 0445  NA 137 138  K 3.3* 4.0  CL 99 104  CO2 25 28  GLUCOSE 120* 98  BUN 9 10  CREATININE 0.71 0.58*  CALCIUM 9.2 7.9*   LFT Recent Labs    12/05/20 0445  PROT 6.2*  ALBUMIN 3.8  AST 31  ALT 66*  ALKPHOS 577*  BILITOT 0.9  BILIDIR 0.2  IBILI 0.7   PT/INR No results for input(s): LABPROT, INR in the last 72 hours.  Studies/Results: CT Angio Chest Pulmonary Embolism (PE) W or WO Contrast  Result Date: 12/04/2020 CLINICAL DATA:  Chest pain short of breath EXAM: CT ANGIOGRAPHY CHEST WITH CONTRAST TECHNIQUE: Multidetector CT imaging of the chest was performed using the standard protocol during bolus administration of intravenous contrast. Multiplanar CT image reconstructions and MIPs were obtained to evaluate the vascular anatomy. CONTRAST:  120m OMNIPAQUE IOHEXOL 350 MG/ML SOLN COMPARISON:  CT 10/18/2020 FINDINGS: Cardiovascular: Satisfactory opacification of the pulmonary arteries to the  segmental level. No evidence of pulmonary embolism. Normal heart size. No pericardial effusion. Nonaneurysmal aorta. No dissection is seen. Mediastinum/Nodes: No enlarged mediastinal, hilar, or axillary lymph nodes. Thyroid gland, trachea, and esophagus  demonstrate no significant findings. Lungs/Pleura: Emphysema. No acute airspace disease, pleural effusion, or pneumothorax Upper Abdomen: No acute abnormality. Musculoskeletal: Diffuse skeletal metastatic disease as before Review of the MIP images confirms the above findings. IMPRESSION: 1. Negative for acute pulmonary embolus or aortic dissection 2. Emphysema without acute airspace disease 3. Diffuse skeletal metastatic disease Aortic Atherosclerosis (ICD10-I70.0) and Emphysema (ICD10-J43.9). Electronically Signed   By: Donavan Foil M.D.   On: 12/04/2020 18:45   MR 3D Recon At Scanner  Result Date: 12/05/2020 CLINICAL DATA:  Right upper quadrant pain, jaundice EXAM: MRI ABDOMEN WITHOUT AND WITH CONTRAST (INCLUDING MRCP) TECHNIQUE: Multiplanar multisequence MR imaging of the abdomen was performed both before and after the administration of intravenous contrast. Heavily T2-weighted images of the biliary and pancreatic ducts were obtained, and three-dimensional MRCP images were rendered by post processing. CONTRAST:  16m GADAVIST GADOBUTROL 1 MMOL/ML IV SOLN COMPARISON:  CTA abdomen/pelvis dated 12/04/2020. Right upper quadrant ultrasound dated 12/04/2020. CT abdomen/pelvis dated 12/04/2020. FINDINGS: Motion degraded images. Lower chest: Lung bases are clear. Hepatobiliary: Liver is within normal limits. No suspicious/enhancing hepatic lesions. Gallbladder is unremarkable. No gallstones or associated inflammatory changes. Mild intrahepatic ductal dilatation. Common duct measures 8 mm (series 3/image 13). No choledocholithiasis is seen. On delayed imaging, there is mild peribiliary enhancement centrally in the liver (series 35/image 27). This appearance is  nonspecific and may reflect cholangitis, although bile duct tumor/cholangiocarcinoma is not excluded. Pancreas: 3.8 cm nonenhancing cyst in the pancreatic tail (series 5/image 11). Spleen:  Within normal limits. Adrenals/Urinary Tract:  Adrenal glands are within normal limits. Kidneys are within normal limits.  No hydronephrosis. Stomach/Bowel: Stomach is within normal limits. Visualized bowel is grossly unremarkable. Vascular/Lymphatic:  No evidence of abdominal aortic aneurysm. No suspicious abdominal lymphadenopathy. Other:  No abdominal ascites. Musculoskeletal: Multifocal osseous metastases. IMPRESSION: Motion degraded images. Mild intrahepatic ductal dilatation with delayed peribiliary enhancement, nonspecific. This may reflect cholangitis, although bile duct tumor/cholangiocarcinoma is not excluded. Common duct measures 8 mm.  No choledocholithiasis is seen. Additional ancillary findings as above. Electronically Signed   By: SJulian HyM.D.   On: 12/05/2020 00:37   MR ABDOMEN MRCP W WO CONTAST  Result Date: 12/05/2020 CLINICAL DATA:  Right upper quadrant pain, jaundice EXAM: MRI ABDOMEN WITHOUT AND WITH CONTRAST (INCLUDING MRCP) TECHNIQUE: Multiplanar multisequence MR imaging of the abdomen was performed both before and after the administration of intravenous contrast. Heavily T2-weighted images of the biliary and pancreatic ducts were obtained, and three-dimensional MRCP images were rendered by post processing. CONTRAST:  818mGADAVIST GADOBUTROL 1 MMOL/ML IV SOLN COMPARISON:  CTA abdomen/pelvis dated 12/04/2020. Right upper quadrant ultrasound dated 12/04/2020. CT abdomen/pelvis dated 12/04/2020. FINDINGS: Motion degraded images. Lower chest: Lung bases are clear. Hepatobiliary: Liver is within normal limits. No suspicious/enhancing hepatic lesions. Gallbladder is unremarkable. No gallstones or associated inflammatory changes. Mild intrahepatic ductal dilatation. Common duct measures 8 mm (series  3/image 13). No choledocholithiasis is seen. On delayed imaging, there is mild peribiliary enhancement centrally in the liver (series 35/image 27). This appearance is nonspecific and may reflect cholangitis, although bile duct tumor/cholangiocarcinoma is not excluded. Pancreas: 3.8 cm nonenhancing cyst in the pancreatic tail (series 5/image 11). Spleen:  Within normal limits. Adrenals/Urinary Tract:  Adrenal glands are within normal limits. Kidneys are within normal limits.  No hydronephrosis. Stomach/Bowel: Stomach is within normal limits. Visualized bowel is grossly unremarkable. Vascular/Lymphatic:  No evidence of abdominal aortic aneurysm. No suspicious abdominal lymphadenopathy. Other:  No abdominal ascites. Musculoskeletal: Multifocal osseous metastases. IMPRESSION: Motion  degraded images. Mild intrahepatic ductal dilatation with delayed peribiliary enhancement, nonspecific. This may reflect cholangitis, although bile duct tumor/cholangiocarcinoma is not excluded. Common duct measures 8 mm.  No choledocholithiasis is seen. Additional ancillary findings as above. Electronically Signed   By: Julian Hy M.D.   On: 12/05/2020 00:37   CT RENAL STONE STUDY  Result Date: 12/04/2020 CLINICAL DATA:  Flank pain since last evening. EXAM: CT ABDOMEN AND PELVIS WITHOUT CONTRAST TECHNIQUE: Multidetector CT imaging of the abdomen and pelvis was performed following the standard protocol without IV contrast. COMPARISON:  CT scan 11/15/2020 FINDINGS: Lower chest: The lung bases are clear of acute process. No pleural effusion or pulmonary lesions. The heart is normal in size. No pericardial effusion. The distal esophagus and aorta are unremarkable. Hepatobiliary: No hepatic lesions or intrahepatic biliary dilatation. The gallbladder is unremarkable. No common bile duct dilatation. Pancreas: Stable 2.7 cm cystic lesion in the pancreatic tail. No acute inflammation or ductal dilatation. Spleen: Normal size. No focal  lesions. Adrenals/Urinary Tract: Adrenal glands and kidneys are unremarkable. No renal, ureteral bladder calculi obvious mass without contrast. Stomach/Bowel: The stomach, duodenum, small bowel and colon are grossly normal. Vascular/Lymphatic: The aorta is normal in caliber. No atheroscerlotic calcifications. No mesenteric of retroperitoneal mass or adenopathy. Small scattered lymph nodes are noted. Reproductive: The prostate gland and seminal vesicles unremarkable. Other: No pelvic mass or adenopathy. No free pelvic fluid collections. No inguinal mass or adenopathy. No abdominal wall hernia or subcutaneous lesions. Musculoskeletal: Stable diffuse sclerotic metastatic bone disease related to patient's known prostate cancer. No acute bony findings are identified. IMPRESSION: 1. No acute abdominal/pelvic findings, mass lesions or adenopathy. 2. No renal, ureteral or bladder calculi or obvious mass without contrast. 3. Stable diffuse sclerotic metastatic bone disease related to patient's known prostate cancer. 4. Stable 2.7 cm cystic lesion in the pancreatic tail. Electronically Signed   By: Marijo Sanes M.D.   On: 12/04/2020 07:45   CT Angio Abd/Pel W and/or Wo Contrast  Result Date: 12/04/2020 CLINICAL DATA:  Chest pain short of breath nausea vomiting EXAM: CTA ABDOMEN AND PELVIS WITHOUT AND WITH CONTRAST TECHNIQUE: Multidetector CT imaging of the abdomen and pelvis was performed using the standard protocol during bolus administration of intravenous contrast. Multiplanar reconstructed images and MIPs were obtained and reviewed to evaluate the vascular anatomy. CONTRAST:  120m OMNIPAQUE IOHEXOL 350 MG/ML SOLN COMPARISON:  CT 12/04/2020, 11/15/2020, 10/18/2020, PET CT 03/03/2017, CT 09/26/2020 FINDINGS: VASCULAR Aorta: Normal caliber aorta without aneurysm, dissection, vasculitis or significant stenosis. Celiac: Patent without evidence of aneurysm, dissection, vasculitis or significant stenosis. SMA: Patent  without evidence of aneurysm, dissection, vasculitis or significant stenosis. Renals: Single right renal artery with mild stenosis about a cm distal to the origin. Two left renal arteries, more cranial accessory artery is patent and without occlusion or stenosis. Dominant main renal artery demonstrates moderate severe focal stenosis of the proximal renal artery about 8 mm distal to the origin, coronal series 18 image 60. IMA: Patent without evidence of aneurysm, dissection, vasculitis or significant stenosis. Inflow: Patent without evidence of aneurysm, dissection, vasculitis or significant stenosis. Proximal Outflow: Bilateral common femoral and visualized portions of the superficial and profunda femoral arteries are patent without evidence of aneurysm, dissection, vasculitis or significant stenosis. Review of the MIP images confirms the above findings. NON-VASCULAR Lower chest: Emphysema.  No acute 1 airspace disease or effusion. Hepatobiliary: No calcified gallstone. No focal hepatic abnormality. Mild intra hepatic biliary dilatation. Common duct diameter up to 8 mm. Pancreas:  No inflammation. 2.9 cm cyst in the pancreatic tail no change. Spleen: Normal in size without focal abnormality. Adrenals/Urinary Tract: Adrenal glands are unremarkable. Kidneys are normal, without renal calculi, focal lesion, or hydronephrosis. Bladder is unremarkable. Stomach/Bowel: Stomach is within normal limits. Appendix not well seen but no right lower quadrant inflammatory process. No evidence of bowel wall thickening, distention, or inflammatory changes. Negative for intramural air. Liquid stools in the colon. Lymphatic: No suspicious lymph nodes Reproductive: Negative prostate Other: Negative for free air. Trace fluid in the right upper quadrant adjacent to the inferior margin of the right hepatic lobe Musculoskeletal: Diffuse skeletal sclerotic metastatic disease as noted on previous exams. IMPRESSION: VASCULAR 1. Negative for  aortic dissection or acute occlusive disease. 2. Mild right stenosis of proximal right renal artery. Moderate to marked focal stenosis of dominant proximal left renal artery. NON-VASCULAR 1. No CT evidence for acute intra-abdominal or pelvic abnormality. 2. Mild intra and extrahepatic biliary dilatation which may be correlated with LFTs with follow-up MR or ERCP is indicated 3. Stable sclerotic metastatic disease 4. Stable cystic lesion in the pancreatic tail Electronically Signed   By: Donavan Foil M.D.   On: 12/04/2020 18:39   US Abdomen Limited RUQ (LIVER/GB)  Result Date: 12/04/2020 CLINICAL DATA:  Right upper quadrant pain. EXAM: ULTRASOUND ABDOMEN LIMITED RIGHT UPPER QUADRANT COMPARISON:  CT abdomen pelvis from same day. FINDINGS: Gallbladder: No gallstones or wall thickening visualized. No sonographic Murphy sign noted by sonographer. Common bile duct: Diameter: 5 mm, normal. Liver: Mild central intrahepatic biliary dilatation. No focal lesion identified. Within normal limits in parenchymal echogenicity. Portal vein is patent on color Doppler imaging with normal direction of blood flow towards the liver. Other: None. IMPRESSION: 1. Mild central intrahepatic biliary dilatation without common bile duct dilatation. Consider further evaluation with ERCP or MRCP. Electronically Signed   By: Titus Dubin M.D.   On: 12/04/2020 11:11    Impression  Acute abdominal pain  in setting of bone mets and NSAID use WBC 10.2 HGB 11.2 MCV 88.9 Platelets 151 No anemia, no melena.  3 weeks of aleve two pills every 4 hours.    Prostate cancer metastatic to multiple sites [facility]) - bone metastasis   Elevated alkaline phosphatase with dilation of biliary tract on [place] and MRCP AST 31 ALT 66 Alkphos 577 TBili 0.9 No fever, chills, possibly related to opioid use. No leukocytosis with left shift. No clinical evidence of cholangitis.   AF (paroxysmal atrial fibrillation) (HCC)   GERD without esophagitis     Plan Continue Protonix, 40 mg twice daily.  Add Carafate 1 g 3-4 times daily. Directed patient to stop use of Aleve and other NSAIDs for pain.  Instructed patient to discuss pain management with his oncologist. Since patient has no evidence of GI bleeding at this time, will proceed with medical management. If patient develops melena, other evidence of bleeding, can consider scheduling EGD. Dilation of biliary tract may be due to opioid use.  Currently the clinical picture does not correlate with cholangitis. No fever, chills, nausea, vomiting, normal bilirubin.  Could consider fractionated alk phos, but elevated alk phos most likely due to bony mets. Continue empiric treatment with Zosyn for 5 days.  Can switch to Augmentin outpatient as needed.   LOS: 0 days   Vladimir Crofts  PA-C 12/05/2020, 8:03 AM  Contact #  [phone]

## 2020-12-05 NOTE — Plan of Care (Signed)
  Problem: Activity: Goal: Risk for activity intolerance will decrease Outcome: Progressing   Problem: Nutrition: Goal: Adequate nutrition will be maintained Outcome: Progressing   Problem: Pain Managment: Goal: General experience of comfort will improve Outcome: Progressing   Problem: Education: Goal: Knowledge of General Education information will improve Description: Including pain rating scale, medication(s)/side effects and non-pharmacologic comfort measures Outcome: Completed/Met   Problem: Coping: Goal: Level of anxiety will decrease Outcome: Completed/Met

## 2020-12-05 NOTE — TOC Progression Note (Signed)
Transition of Care [facility]) - Progression Note    Patient Details  Name: Ryan Davis MRN: [id number] Date of Birth: 13-Mar-1960  Transition of Care [facility]) CM/SW Contact  Purcell Mouton, RN Phone Number: 12/05/2020, 10:30 AM  Clinical Narrative:    Pt from home with spouse and was admitted as observation with cco Abdominal pain.    Expected Discharge Plan: Home/Self Care Barriers to Discharge: No Barriers Identified  Expected Discharge Plan and Services Expected Discharge Plan: Home/Self Care       Living arrangements for the past 2 months: Single Family Home                                       Social Determinants of Health (SDOH) Interventions    Readmission Risk Interventions No flowsheet data found.

## 2020-12-05 NOTE — Evaluation (Signed)
Physical Therapy Evaluation Patient Details Name: Ryan Davis MRN: [id number] DOB: [birth year] Today's Date: 12/05/2020  History of Present Illness  60 year old male with metastatic prostate cancer, bony metastases; presents to ED with c/o abdominal pain-periumbilical radiating to the back. PMH significant for Afib, back pain , Prostate cancer.    Clinical Impression  Ryan Davis is 60 y.o. male admitted with above HPI and diagnosis. Patient is currently limited by functional impairments below (see PT problem list). Patient lives with his wife and is independent at baseline. Patient evaluated by Physical Therapy with no further acute PT needs identified. All education has been completed and the patient has no further questions. He is mobilizing at supervision level and is safe to mobilize on unit and in room with RN/NT staff. Encouraged pt to ambulate on unit with supervision as able. See below for any follow-up Physical Therapy or equipment needs. PT is signing off. Thank you for this referral.        Recommendations for follow up therapy are one component of a multi-disciplinary discharge planning process, led by the attending physician.  Recommendations may be updated based on patient status, additional functional criteria and insurance authorization.  Follow Up Recommendations Outpatient PT (OPPT for balance and strengthening)    Assistance Recommended at Discharge PRN  Functional Status Assessment Patient has had a recent decline in their functional status and demonstrates the ability to make significant improvements in function in a reasonable and predictable amount of time.  Equipment Recommendations  None recommended by PT    Recommendations for Other Services       Precautions / Restrictions Precautions Precautions: Fall Restrictions Weight Bearing Restrictions: No      Mobility  Bed Mobility Overal bed mobility: Needs Assistance Bed Mobility: Supine to Sit;Sit to  Supine     Supine to sit: Supervision Sit to supine: Supervision   General bed mobility comments: supervision for safety, no cues or assist    Transfers Overall transfer level: Needs assistance Equipment used: None Transfers: Sit to/from Stand Sit to Stand: Supervision           General transfer comment: no assist needed to rise, and pt steady in standing    Ambulation/Gait Ambulation/Gait assistance: Supervision;Min assist Gait Distance (Feet): 400 Feet Assistive device: IV Pole Gait Pattern/deviations: Step-through pattern;Trunk flexed;Drifts right/left Gait velocity: decr     General Gait Details: cues for safe management of IV pole and 1 episode of LOB requiring assist to regain balance. cues needed to scan environment for obstacles to remain safe.  Stairs            Wheelchair Mobility    Modified Rankin (Stroke Patients Only)       Balance Overall balance assessment: Mild deficits observed, not formally tested;Needs assistance Sitting-balance support: Feet supported Sitting balance-Leahy Scale: Good     Standing balance support: Reliant on assistive device for balance;During functional activity;Single extremity supported Standing balance-Leahy Scale: Fair   Single Leg Stance - Right Leg: 3 Single Leg Stance - Left Leg: 2                         Pertinent Vitals/Pain Pain Assessment: No/denies pain    Home Living Family/patient expects to be discharged to:: Private residence Living Arrangements: Spouse/significant other Available Help at Discharge: Family Type of Home: House Home Access: Ramped entrance       Home Layout: One level Home Equipment: Shower seat;Cane - single point Additional  Comments: enjoys working on cars, visiting family, is his wifes caregiver    Prior Function Prior Level of Function : Independent/Modified Independent             Mobility Comments: uses [facility] for mobility, provides care for his wife        Hand Dominance        Extremity/Trunk Assessment   Upper Extremity Assessment Upper Extremity Assessment: Overall WFL for tasks assessed    Lower Extremity Assessment Lower Extremity Assessment: Generalized weakness (5xSit<>Stand: pt using bil UE's, 22.6 seconds)    Cervical / Trunk Assessment Cervical / Trunk Assessment: Normal  Communication   Communication: No difficulties  Cognition Arousal/Alertness: Awake/alert Behavior During Therapy: WFL for tasks assessed/performed Overall Cognitive Status: Within Functional Limits for tasks assessed                                          General Comments      Exercises     Assessment/Plan    PT Assessment Patient does not need any further PT services  PT Problem List Decreased strength;Decreased mobility;Decreased balance       PT Treatment Interventions DME instruction;Stair training;Gait training;Functional mobility training;Therapeutic activities;Therapeutic exercise;Balance training;Patient/family education    PT Goals (Current goals can be found in the Care Plan section)  Acute Rehab PT Goals Patient Stated Goal: go home PT Goal Formulation: All assessment and education complete, DC therapy Time For Goal Achievement: 12/05/20 Potential to Achieve Goals: Good    Frequency Other (Comment) (1x eval)   Barriers to discharge        Co-evaluation               AM-PAC PT "6 Clicks" Mobility  Outcome Measure Help needed turning from your back to your side while in a flat bed without using bedrails?: None Help needed moving from lying on your back to sitting on the side of a flat bed without using bedrails?: None Help needed moving to and from a bed to a chair (including a wheelchair)?: None Help needed standing up from a chair using your arms (e.g., wheelchair or bedside chair)?: None Help needed to walk in hospital room?: A Little Help needed climbing 3-5 steps with a railing? : A  Little 6 Click Score: 22    End of Session Equipment Utilized During Treatment: Gait belt Activity Tolerance: Patient tolerated treatment well Patient left: in bed;with call bell/phone within reach Nurse Communication: Mobility status PT Visit Diagnosis: Unsteadiness on feet (R26.81);Difficulty in walking, not elsewhere classified (R26.2)    Time: 3818-4037 PT Time Calculation (min) (ACUTE ONLY): 17 min   Charges:   PT Evaluation $PT Eval Low Complexity: 1 Low          Verner Mould, DPT Acute Rehabilitation Services Office [phone] Pager [phone]   Jacques Navy 12/05/2020, 2:43 PM

## 2020-12-05 NOTE — Progress Notes (Signed)
Pharmacy Antibiotic Note  Ryan Davis is a 60 y.o. male admitted on 12/04/2020 with intra-abdominal infection.  Pharmacy has been consulted for Zosyn dosing.  Plan: Zosyn 3.375g IV q8h (4 hour infusion). Need for further dosage adjustment appears unlikely at present.    Will sign off at this time.  Please reconsult if a change in clinical status warrants re-evaluation of dosage.   Height: 6\' 2"  (188 cm) Weight: 72.6 kg (160 lb) IBW/kg (Calculated) : 82.2  Temp (24hrs), Avg:98.6 F (37 C), Min:98.4 F (36.9 C), Max:98.9 F (37.2 C)  Recent Labs  Lab 12/04/20 0735 12/04/20 0745  WBC  --  9.9  CREATININE 0.71  --     Estimated Creatinine Clearance: 102.1 mL/min (by C-G formula based on SCr of 0.71 mg/dL).    Allergies  Allergen Reactions   Contrast Media [Iodinated Diagnostic Agents] Nausea And Vomiting    Patient vomits immediately after IV contrast is injected just prior to start of scan, was unable to scan patient in correct time frame.  Patient did not warn me of this when dosing the patient with oral CM and asking the patient history questions until the patient was on the table for the CT scan just prior to injection.  Ct Tech:  Karl Bales 07/29/17    Other Nausea And Vomiting    Patient vomits immediately after IV contrast is injected just prior to start of scan, was unable to scan patient in correct time frame.  Patient did not warn me of this when dosing the patient with oral CM and asking the patient history questions until the patient was on the table for the CT scan just prior to injection.  Ct Tech:  Karl Bales 07/29/17       Thank you for allowing pharmacy to be a part of this patient's care.  Everette Rank, PharmD 12/05/2020 4:18 AM

## 2020-12-05 NOTE — Progress Notes (Signed)
PROGRESS NOTE  Ryan Davis WEX:[id number] DOB: 1960/10/07 DOA: 12/04/2020 PCP: Robert Bellow, PA-C   LOS: 0 days   Brief Narrative / Interim history: 60 year old male with history of metastatic prostate cancer to multiple sites including bone, chronic pain secondary to metastatic disease, paroxysmal AF not on anticoagulation, comes into the hospital with abdominal and back pain.  He has been reporting worsening mid back pain over the last several days, and has been taking Aleve's around-the-clock.  While eating dinner the day prior to admission has noticed epigastric abdominal pain, sharp, and decided to come to the hospital.  He did have incidental intra and extrahepatic biliary dilatation for which he underwent an MRCP, which was suspect for cholangitis versus cholangiocarcinoma.  He was admitted to the hospital, GI and oncology consulted  Subjective / 24h Interval events: He is doing well this morning, complains of ongoing back pain.  Has been taking home morphine, alternating with oxycodone, also has a fentanyl patch but back pain seems to be bothering him a lot still.  Assessment & Plan: Principal Problem Back pain, abdominal pain-patient noted to have osseous metastasis from his prostate cancer, possibly that his back pain got worse due to that.  We will obtain an MRI of the thoracic spine in the area where he complains of the most pain.  He reported some subjective weakness as well.  His abdominal pain is possibly related to his heavy NSAID use -Continue pain regimen.  At home he was taking MS IR alternating with oxycodone every 4 hours.  Resume those here.  Intermittently has used IV Dilaudid  Active Problems Abnormal LFTs-mild, MRCP raise suspicion for cholangitis versus cholangiocarcinoma.  GI consulted, discussed with Dr. Therisa Doyne and cholangitis / cholangiocarcinoma is less likely at this point.  She does recommend treatment with 5 days of antibiotics because of LFT elevation and also  uncertainty.  LFTs could have been raised by heavy NSAID use.  LFTs are much better this morning -GI also recommends repeat abdominal imaging in 4 weeks to rule out a new neoplastic process such as cholangiocarcinoma which is not very obvious right now  Hypokalemia-replete and monitor  Metastatic prostate cancer-appreciate Dr. Antonieta Pert input  Paroxysmal A. fib-low CHA2DS2-VASc's, on aspirin monotherapy  GERD/concern for PUD due to heavy NSAID use-continue PPI   Scheduled Meds:  aspirin  81 mg Oral Daily   Chlorhexidine Gluconate Cloth  6 each Topical Daily   enoxaparin (LOVENOX) injection  40 mg Subcutaneous Q24H   fentaNYL  2 patch Transdermal Q48H   gabapentin  800 mg Oral TID   metoprolol tartrate  12.5 mg Oral BID   pantoprazole  40 mg Oral BID   Plecanatide  3 mg Oral Daily   polyethylene glycol  17 g Oral BID   potassium chloride  40 mEq Oral Once   senna  2 tablet Oral BID   sucralfate  1 g Oral TID WC & HS   tamsulosin  0.4 mg Oral Daily   Continuous Infusions:  lactated ringers 125 mL/hr at 12/05/20 0913   piperacillin-tazobactam (ZOSYN)  IV 3.375 g (12/05/20 1215)   PRN Meds:.acetaminophen **OR** acetaminophen, cyclobenzaprine, oxyCODONE **OR** HYDROmorphone (DILAUDID) injection, melatonin, ondansetron **OR** ondansetron (ZOFRAN) IV, polyethylene glycol, sodium chloride flush  Diet Orders (From admission, onward)     Start     Ordered   12/05/20 1001  Diet regular Room service appropriate? No; Fluid consistency: Thin  Diet effective now       Question Answer Comment  Room service appropriate? No   Fluid consistency: Thin      12/05/20 1001            DVT prophylaxis: enoxaparin (LOVENOX) injection 40 mg Start: 12/04/20 2300     Code Status: Full Code  Family Communication: no family at bedside   Status is: Inpatient  Inpatient because of uncontrolled pain, IV antibiotics, MRI T spine pending given LE weakness / intractable pain  Level of care:  Telemetry  Consultants:  GI Oncology   Procedures:  none  Microbiology  none  Antimicrobials: Zosyn 11/8 >>    Objective: Vitals:   12/05/20 0607 12/05/20 0826 12/05/20 1100 12/05/20 1300  BP: 118/80 116/77  104/69  Pulse: 84 77  72  Resp: 17   18  Temp: 99 F (37.2 C) 98.2 F (36.8 C)  98.5 F (36.9 C)  TempSrc: Oral Oral  Oral  SpO2: 100% 99% 99%   Weight:      Height:        Intake/Output Summary (Last 24 hours) at 12/05/2020 1414 Last data filed at 12/05/2020 1217 Gross per 24 hour  Intake 1374.4 ml  Output 300 ml  Net 1074.4 ml   Filed Weights   12/04/20 0635  Weight: 72.6 kg    Examination:  Constitutional: NAD Eyes: no scleral icterus ENMT: Mucous membranes are moist.  Neck: normal, supple Respiratory: clear to auscultation bilaterally, no wheezing, no crackles. Normal respiratory effort. No accessory muscle use.  Cardiovascular: Regular rate and rhythm, no murmurs / rubs / gallops. No LE edema.  Abdomen: non distended, no tenderness. Bowel sounds positive.  Musculoskeletal: no clubbing / cyanosis.  Skin: no rashes Neurologic: non focal   Data Reviewed: I have independently reviewed following labs and imaging studies  CBC: Recent Labs  Lab 12/04/20 0745 12/05/20 0445  WBC 9.9 10.2  NEUTROABS 6.7 6.7  HGB 12.7* 11.2*  HCT 38.1* 33.6*  MCV 89.9 88.9  PLT 183 967   Basic Metabolic Panel: Recent Labs  Lab 12/04/20 0735 12/05/20 0445  NA 137 138  K 3.3* 4.0  CL 99 104  CO2 25 28  GLUCOSE 120* 98  BUN 9 10  CREATININE 0.71 0.58*  CALCIUM 9.2 7.9*  MG 2.3 2.4   Liver Function Tests: Recent Labs  Lab 12/04/20 0735 12/05/20 0445  AST 76* 31  ALT 102* 66*  ALKPHOS 720* 577*  BILITOT 1.1 0.9  PROT 7.9 6.2*  ALBUMIN 4.8 3.8   Coagulation Profile: No results for input(s): INR, PROTIME in the last 168 hours. HbA1C: No results for input(s): HGBA1C in the last 72 hours. CBG: No results for input(s): GLUCAP in the last 168  hours.  Recent Results (from the past 240 hour(s))  Resp Panel by RT-PCR (Flu A&B, Covid) Nasopharyngeal Swab     Status: None   Collection Time: 12/04/20  3:53 PM   Specimen: Nasopharyngeal Swab; Nasopharyngeal(NP) swabs in vial transport medium  Result Value Ref Range Status   SARS Coronavirus 2 by RT PCR NEGATIVE NEGATIVE Final    Comment: (NOTE) SARS-CoV-2 target nucleic acids are NOT DETECTED.  The SARS-CoV-2 RNA is generally detectable in upper respiratory specimens during the acute phase of infection. The lowest concentration of SARS-CoV-2 viral copies this assay can detect is 138 copies/mL. A negative result does not preclude SARS-Cov-2 infection and should not be used as the sole basis for treatment or other patient management decisions. A negative result may occur with  improper specimen collection/handling, submission of  specimen other than nasopharyngeal swab, presence of viral mutation(s) within the areas targeted by this assay, and inadequate number of viral copies(<138 copies/mL). A negative result must be combined with clinical observations, patient history, and epidemiological information. The expected result is Negative.  Fact Sheet for Patients:  EntrepreneurPulse.com.au  Fact Sheet for Healthcare Providers:  IncredibleEmployment.be  This test is no t yet approved or cleared by the [place] FDA and  has been authorized for detection and/or diagnosis of SARS-CoV-2 by FDA under an Emergency Use Authorization (EUA). This EUA will remain  in effect (meaning this test can be used) for the duration of the COVID-19 declaration under Section 564(b)(1) of the Act, 21 U.S.C.section 360bbb-3(b)(1), unless the authorization is terminated  or revoked sooner.       Influenza A by PCR NEGATIVE NEGATIVE Final   Influenza B by PCR NEGATIVE NEGATIVE Final    Comment: (NOTE) The Xpert Xpress SARS-CoV-2/FLU/RSV plus assay is intended  as an aid in the diagnosis of influenza from Nasopharyngeal swab specimens and should not be used as a sole basis for treatment. Nasal washings and aspirates are unacceptable for Xpert Xpress SARS-CoV-2/FLU/RSV testing.  Fact Sheet for Patients: EntrepreneurPulse.com.au  Fact Sheet for Healthcare Providers: IncredibleEmployment.be  This test is not yet approved or cleared by the [place] FDA and has been authorized for detection and/or diagnosis of SARS-CoV-2 by FDA under an Emergency Use Authorization (EUA). This EUA will remain in effect (meaning this test can be used) for the duration of the COVID-19 declaration under Section 564(b)(1) of the Act, 21 U.S.C. section 360bbb-3(b)(1), unless the authorization is terminated or revoked.  Performed at [facility], [address] [place], [place] [address]      Radiology Studies: CT Angio Chest Pulmonary Embolism (PE) W or WO Contrast  Result Date: 12/04/2020 CLINICAL DATA:  Chest pain short of breath EXAM: CT ANGIOGRAPHY CHEST WITH CONTRAST TECHNIQUE: Multidetector CT imaging of the chest was performed using the standard protocol during bolus administration of intravenous contrast. Multiplanar CT image reconstructions and MIPs were obtained to evaluate the vascular anatomy. CONTRAST:  150mL OMNIPAQUE IOHEXOL 350 MG/ML SOLN COMPARISON:  CT 10/18/2020 FINDINGS: Cardiovascular: Satisfactory opacification of the pulmonary arteries to the segmental level. No evidence of pulmonary embolism. Normal heart size. No pericardial effusion. Nonaneurysmal aorta. No dissection is seen. Mediastinum/Nodes: No enlarged mediastinal, hilar, or axillary lymph nodes. Thyroid gland, trachea, and esophagus demonstrate no significant findings. Lungs/Pleura: Emphysema. No acute airspace disease, pleural effusion, or pneumothorax Upper Abdomen: No acute abnormality. Musculoskeletal: Diffuse skeletal metastatic  disease as before Review of the MIP images confirms the above findings. IMPRESSION: 1. Negative for acute pulmonary embolus or aortic dissection 2. Emphysema without acute airspace disease 3. Diffuse skeletal metastatic disease Aortic Atherosclerosis (ICD10-I70.0) and Emphysema (ICD10-J43.9). Electronically Signed   By: Donavan Foil M.D.   On: 12/04/2020 18:45   MR 3D Recon At Scanner  Result Date: 12/05/2020 CLINICAL DATA:  Right upper quadrant pain, jaundice EXAM: MRI ABDOMEN WITHOUT AND WITH CONTRAST (INCLUDING MRCP) TECHNIQUE: Multiplanar multisequence MR imaging of the abdomen was performed both before and after the administration of intravenous contrast. Heavily T2-weighted images of the biliary and pancreatic ducts were obtained, and three-dimensional MRCP images were rendered by post processing. CONTRAST:  59mL GADAVIST GADOBUTROL 1 MMOL/ML IV SOLN COMPARISON:  CTA abdomen/pelvis dated 12/04/2020. Right upper quadrant ultrasound dated 12/04/2020. CT abdomen/pelvis dated 12/04/2020. FINDINGS: Motion degraded images. Lower chest: Lung bases are clear. Hepatobiliary: Liver is within  normal limits. No suspicious/enhancing hepatic lesions. Gallbladder is unremarkable. No gallstones or associated inflammatory changes. Mild intrahepatic ductal dilatation. Common duct measures 8 mm (series 3/image 13). No choledocholithiasis is seen. On delayed imaging, there is mild peribiliary enhancement centrally in the liver (series 35/image 27). This appearance is nonspecific and may reflect cholangitis, although bile duct tumor/cholangiocarcinoma is not excluded. Pancreas: 3.8 cm nonenhancing cyst in the pancreatic tail (series 5/image 11). Spleen:  Within normal limits. Adrenals/Urinary Tract:  Adrenal glands are within normal limits. Kidneys are within normal limits.  No hydronephrosis. Stomach/Bowel: Stomach is within normal limits. Visualized bowel is grossly unremarkable. Vascular/Lymphatic:  No evidence of abdominal  aortic aneurysm. No suspicious abdominal lymphadenopathy. Other:  No abdominal ascites. Musculoskeletal: Multifocal osseous metastases. IMPRESSION: Motion degraded images. Mild intrahepatic ductal dilatation with delayed peribiliary enhancement, nonspecific. This may reflect cholangitis, although bile duct tumor/cholangiocarcinoma is not excluded. Common duct measures 8 mm.  No choledocholithiasis is seen. Additional ancillary findings as above. Electronically Signed   By: Julian Hy M.D.   On: 12/05/2020 00:37   MR ABDOMEN MRCP W WO CONTAST  Result Date: 12/05/2020 CLINICAL DATA:  Right upper quadrant pain, jaundice EXAM: MRI ABDOMEN WITHOUT AND WITH CONTRAST (INCLUDING MRCP) TECHNIQUE: Multiplanar multisequence MR imaging of the abdomen was performed both before and after the administration of intravenous contrast. Heavily T2-weighted images of the biliary and pancreatic ducts were obtained, and three-dimensional MRCP images were rendered by post processing. CONTRAST:  82mL GADAVIST GADOBUTROL 1 MMOL/ML IV SOLN COMPARISON:  CTA abdomen/pelvis dated 12/04/2020. Right upper quadrant ultrasound dated 12/04/2020. CT abdomen/pelvis dated 12/04/2020. FINDINGS: Motion degraded images. Lower chest: Lung bases are clear. Hepatobiliary: Liver is within normal limits. No suspicious/enhancing hepatic lesions. Gallbladder is unremarkable. No gallstones or associated inflammatory changes. Mild intrahepatic ductal dilatation. Common duct measures 8 mm (series 3/image 13). No choledocholithiasis is seen. On delayed imaging, there is mild peribiliary enhancement centrally in the liver (series 35/image 27). This appearance is nonspecific and may reflect cholangitis, although bile duct tumor/cholangiocarcinoma is not excluded. Pancreas: 3.8 cm nonenhancing cyst in the pancreatic tail (series 5/image 11). Spleen:  Within normal limits. Adrenals/Urinary Tract:  Adrenal glands are within normal limits. Kidneys are within  normal limits.  No hydronephrosis. Stomach/Bowel: Stomach is within normal limits. Visualized bowel is grossly unremarkable. Vascular/Lymphatic:  No evidence of abdominal aortic aneurysm. No suspicious abdominal lymphadenopathy. Other:  No abdominal ascites. Musculoskeletal: Multifocal osseous metastases. IMPRESSION: Motion degraded images. Mild intrahepatic ductal dilatation with delayed peribiliary enhancement, nonspecific. This may reflect cholangitis, although bile duct tumor/cholangiocarcinoma is not excluded. Common duct measures 8 mm.  No choledocholithiasis is seen. Additional ancillary findings as above. Electronically Signed   By: Julian Hy M.D.   On: 12/05/2020 00:37   CT Angio Abd/Pel W and/or Wo Contrast  Result Date: 12/04/2020 CLINICAL DATA:  Chest pain short of breath nausea vomiting EXAM: CTA ABDOMEN AND PELVIS WITHOUT AND WITH CONTRAST TECHNIQUE: Multidetector CT imaging of the abdomen and pelvis was performed using the standard protocol during bolus administration of intravenous contrast. Multiplanar reconstructed images and MIPs were obtained and reviewed to evaluate the vascular anatomy. CONTRAST:  144mL OMNIPAQUE IOHEXOL 350 MG/ML SOLN COMPARISON:  CT 12/04/2020, 11/15/2020, 10/18/2020, PET CT 03/03/2017, CT 09/26/2020 FINDINGS: VASCULAR Aorta: Normal caliber aorta without aneurysm, dissection, vasculitis or significant stenosis. Celiac: Patent without evidence of aneurysm, dissection, vasculitis or significant stenosis. SMA: Patent without evidence of aneurysm, dissection, vasculitis or significant stenosis. Renals: Single right renal artery with mild stenosis about a  cm distal to the origin. Two left renal arteries, more cranial accessory artery is patent and without occlusion or stenosis. Dominant main renal artery demonstrates moderate severe focal stenosis of the proximal renal artery about 8 mm distal to the origin, coronal series 18 image 60. IMA: Patent without evidence of  aneurysm, dissection, vasculitis or significant stenosis. Inflow: Patent without evidence of aneurysm, dissection, vasculitis or significant stenosis. Proximal Outflow: Bilateral common femoral and visualized portions of the superficial and profunda femoral arteries are patent without evidence of aneurysm, dissection, vasculitis or significant stenosis. Review of the MIP images confirms the above findings. NON-VASCULAR Lower chest: Emphysema.  No acute 1 airspace disease or effusion. Hepatobiliary: No calcified gallstone. No focal hepatic abnormality. Mild intra hepatic biliary dilatation. Common duct diameter up to 8 mm. Pancreas: No inflammation. 2.9 cm cyst in the pancreatic tail no change. Spleen: Normal in size without focal abnormality. Adrenals/Urinary Tract: Adrenal glands are unremarkable. Kidneys are normal, without renal calculi, focal lesion, or hydronephrosis. Bladder is unremarkable. Stomach/Bowel: Stomach is within normal limits. Appendix not well seen but no right lower quadrant inflammatory process. No evidence of bowel wall thickening, distention, or inflammatory changes. Negative for intramural air. Liquid stools in the colon. Lymphatic: No suspicious lymph nodes Reproductive: Negative prostate Other: Negative for free air. Trace fluid in the right upper quadrant adjacent to the inferior margin of the right hepatic lobe Musculoskeletal: Diffuse skeletal sclerotic metastatic disease as noted on previous exams. IMPRESSION: VASCULAR 1. Negative for aortic dissection or acute occlusive disease. 2. Mild right stenosis of proximal right renal artery. Moderate to marked focal stenosis of dominant proximal left renal artery. NON-VASCULAR 1. No CT evidence for acute intra-abdominal or pelvic abnormality. 2. Mild intra and extrahepatic biliary dilatation which may be correlated with LFTs with follow-up MR or ERCP is indicated 3. Stable sclerotic metastatic disease 4. Stable cystic lesion in the pancreatic  tail Electronically Signed   By: Donavan Foil M.D.   On: 12/04/2020 18:39     Marzetta Board, MD, PhD [facility]  Between 7 am - 7 pm I am available, please contact me via Amion (for emergencies) or Securechat (non urgent messages)  Between 7 pm - 7 am I am not available, please contact night coverage MD/APP via Amion

## 2020-12-05 NOTE — Consult Note (Signed)
Referral MD  Reason for Referral: Metastatic prostate cancer-castrate resistant; abdominal pain  Chief Complaint  Patient presents with   Abdominal Pain   Back Pain  : I had a lot of abdominal pain.  HPI: [name] is well-known to me.  He is a 60 year old African-American male.  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  We have tried him on chemotherapy.  He failed this.  We have try to get him to get the new radio isotope-Pluvicto.  He said that nuclear medicine called him yesterday.  I am not sure as to why he has as of abdominal pain.  He said this happened on Monday.  It was nothing that he ate.  He said he has been vomiting.  He had no diarrhea.  There is no fever.  There is no bleeding.  He had a CT scan when he came in.  This, I think I was relatively unremarkable for the abdomen.  I am not sure what to make of this, and about some mild intra and extrahepatic biliary dilatation.  I think that he needs to have Gastroenterology see him and see if an upper endoscopy can be done to look at his stomach and see if there is any type of gastritis.  His last PSA was 148.  When he came in, his labs looked okay.  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  I wonder if the abdominal pain may not be referred from his back.  He may need to have an MRI of the back to see what is going on.  He does not have hypercalcemia.  He says otherwise he seems to be eating okay.  He has had no fever.  He has had no rashes.  There is been no cough or increased shortness of breath.  His CBC when he came in showed a white count 9.9.  Hemoglobin 12.7.  Platelet count 183,000.  Currently, I would say performance status probably ECOG 1. Past Medical History:  Diagnosis Date   A-fib [facility])    see 03-28-16 admission   Back pain without radiculopathy 03/28/2016   Dyspnea    endorses since treatment radiation , still gets SOB occasionally    Dysrhythmia     afib    Goals of care, counseling/discussion 06/24/2017   History of radiation therapy 09/16/16-10/06/16   35 Gy in 14 fractions to the lumbar spine   Nocturia    Prostate cancer metastatic to bone ([place]) 03/31/2016   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[place]) 03/31/2016  :   Past Surgical History:  Procedure Laterality Date   IR IMAGING GUIDED PORT INSERTION  09/05/2020   MULTIPLE EXTRACTIONS WITH ALVEOLOPLASTY N/A 11/19/2016   Procedure: EXTRACTION OF TOOTH #'S 1,3,5-7,9-17, AND 20- 29 WITH ALVEOLOPLASTY, BILATERAL MANDIBULAR TORI REDUCTIONS AND BILATERAL MAXILLARY BUCCAL EXOSTOSES REDUCTIONS;  Surgeon: Lenn Cal, DDS;  Location: [facility];  Service: Oral Surgery;  Laterality: N/A;   NO PAST SURGERIES    :   Current Facility-Administered Medications:    acetaminophen (TYLENOL) tablet 650 mg, 650 mg, Oral, Q6H PRN **OR** acetaminophen (TYLENOL) suppository 650 mg, 650 mg, Rectal, Q6H PRN, Shalhoub, Sherryll Burger, MD   aspirin chewable tablet 81 mg, 81 mg, Oral, Daily, Shalhoub, Sherryll Burger, MD   Chlorhexidine Gluconate Cloth 2 % PADS 6 each, 6 each, Topical, Daily, Shalhoub, Sherryll Burger, MD   cyclobenzaprine (FLEXERIL) tablet 10 mg, 10 mg, Oral, TID PRN, Shalhoub, Sherryll Burger, MD  enoxaparin (LOVENOX) injection 40 mg, 40 mg, Subcutaneous, Q24H, Shalhoub, Sherryll Burger, MD   fentaNYL (DURAGESIC) 100 MCG/HR 2 patch, 2 patch, Transdermal, Q48H, Shalhoub, Sherryll Burger, MD, 2 patch at 12/05/20 0023   gabapentin (NEURONTIN) capsule 800 mg, 800 mg, Oral, TID, Shalhoub, Sherryll Burger, MD   oxyCODONE (Oxy IR/ROXICODONE) immediate release tablet 20 mg, 20 mg, Oral, Q4H PRN **OR** HYDROmorphone (DILAUDID) injection 2 mg, 2 mg, Intravenous, Q4H PRN, Shalhoub, Sherryll Burger, MD, 2 mg at 12/05/20 8756   lactated ringers infusion, , Intravenous, Continuous, Shalhoub, Sherryll Burger, MD, Last Rate: 125 mL/hr at 12/05/20 0040, New Bag at 12/05/20 0040   melatonin tablet 10 mg, 10 mg, Oral, QHS PRN, Shalhoub, Sherryll Burger, MD    metoprolol tartrate (LOPRESSOR) tablet 12.5 mg, 12.5 mg, Oral, BID, Shalhoub, Sherryll Burger, MD, 12.5 mg at 12/05/20 0037   ondansetron (ZOFRAN) tablet 4 mg, 4 mg, Oral, Q6H PRN **OR** ondansetron (ZOFRAN) injection 4 mg, 4 mg, Intravenous, Q6H PRN, Shalhoub, Sherryll Burger, MD   pantoprazole (PROTONIX) injection 40 mg, 40 mg, Intravenous, Q24H, Shalhoub, Sherryll Burger, MD, 40 mg at 12/05/20 0022   piperacillin-tazobactam (ZOSYN) IVPB 3.375 g, 3.375 g, Intravenous, Q8H, Poindexter, Leann T, RPH, Last Rate: 12.5 mL/hr at 12/05/20 0514, 3.375 g at 12/05/20 0514   Plecanatide TABS 3 mg, 3 mg, Oral, Daily, Shalhoub, Sherryll Burger, MD   polyethylene glycol (MIRALAX / GLYCOLAX) packet 17 g, 17 g, Oral, Daily PRN, Shalhoub, Sherryll Burger, MD   polyethylene glycol (MIRALAX / GLYCOLAX) packet 17 g, 17 g, Oral, BID, Shalhoub, Sherryll Burger, MD   potassium chloride SA (KLOR-CON) CR tablet 40 mEq, 40 mEq, Oral, Once, Shalhoub, Sherryll Burger, MD   senna (SENOKOT) tablet 17.2 mg, 2 tablet, Oral, BID, Shalhoub, Sherryll Burger, MD   sodium chloride flush (NS) 0.9 % injection 10-40 mL, 10-40 mL, Intracatheter, PRN, Shalhoub, Sherryll Burger, MD   tamsulosin (FLOMAX) capsule 0.4 mg, 0.4 mg, Oral, Daily, Shalhoub, Sherryll Burger, MD  Facility-Administered Medications Ordered in Other Encounters:    SONAFINE emulsion 1 application, 1 application, Topical, BID, Gery Pray, MD:   aspirin  81 mg Oral Daily   Chlorhexidine Gluconate Cloth  6 each Topical Daily   enoxaparin (LOVENOX) injection  40 mg Subcutaneous Q24H   fentaNYL  2 patch Transdermal Q48H   gabapentin  800 mg Oral TID   metoprolol tartrate  12.5 mg Oral BID   pantoprazole (PROTONIX) IV  40 mg Intravenous Q24H   Plecanatide  3 mg Oral Daily   polyethylene glycol  17 g Oral BID   potassium chloride  40 mEq Oral Once   senna  2 tablet Oral BID   tamsulosin  0.4 mg Oral Daily  :   Allergies  Allergen Reactions   Contrast Media [Iodinated Diagnostic Agents] Nausea And Vomiting    Patient vomits  immediately after IV contrast is injected just prior to start of scan, was unable to scan patient in correct time frame.  Patient did not warn me of this when dosing the patient with oral CM and asking the patient history questions until the patient was on the table for the CT scan just prior to injection.  Ct Tech:  Karl Bales 07/29/17    Other Nausea And Vomiting    Patient vomits immediately after IV contrast is injected just prior to start of scan, was unable to scan patient in correct time frame.  Patient did not warn me of this when dosing the patient with oral CM and  asking the patient history questions until the patient was on the table for the CT scan just prior to injection.  Ct Tech:  Eliezer Lofts McLawhon 07/29/17   :   Family History  Problem Relation Age of Onset   Stroke Sister    Lupus Mother    Cancer Neg Hx    Heart disease Neg Hx    Diabetes Neg Hx   :   Social History   Socioeconomic History   Marital status: Married    Spouse name: Not on file   Number of children: Not on file   Years of education: Not on file   Highest education level: Not on file  Occupational History   Not on file  Tobacco Use   Smoking status: Former    Packs/day: 0.50    Years: 39.00    Pack years: 19.50    Types: Cigarettes    Quit date: 04/30/2020    Years since quitting: 0.6   Smokeless tobacco: Never  Vaping Use   Vaping Use: Never used  Substance and Sexual Activity   Alcohol use: Not Currently   Drug use: No   Sexual activity: Not on file  Other Topics Concern   Not on file  Social History Narrative   Not on file   Social Determinants of Health   Financial Resource Strain: Not on file  Food Insecurity: Not on file  Transportation Needs: Not on file  Physical Activity: Not on file  Stress: Not on file  Social Connections: Not on file  Intimate Partner Violence: Not on file  :  Review of Systems  Constitutional: Negative.   HENT: Negative.    Eyes: Negative.    Respiratory:  Positive for shortness of breath.   Cardiovascular: Negative.   Gastrointestinal:  Positive for abdominal pain and vomiting.  Genitourinary: Negative.   Musculoskeletal:  Positive for back pain, joint pain and neck pain.  Skin: Negative.   Neurological: Negative.   Endo/Heme/Allergies: Negative.   Psychiatric/Behavioral: Negative.      Exam:  Physical Exam Vitals reviewed.  HENT:     Head: Normocephalic and atraumatic.  Eyes:     Pupils: Pupils are equal, round, and reactive to light.  Cardiovascular:     Rate and Rhythm: Normal rate and regular rhythm.     Heart sounds: Normal heart sounds.  Pulmonary:     Effort: Pulmonary effort is normal.     Breath sounds: Normal breath sounds.  Abdominal:     General: Bowel sounds are normal.     Palpations: Abdomen is soft.  Musculoskeletal:        General: No tenderness or deformity. Normal range of motion.     Cervical back: Normal range of motion.  Lymphadenopathy:     Cervical: No cervical adenopathy.  Skin:    General: Skin is warm and dry.     Findings: No erythema or rash.  Neurological:     Mental Status: He is alert and oriented to person, place, and time.  Psychiatric:        Behavior: Behavior normal.        Thought Content: Thought content normal.        Judgment: Judgment normal.   Patient Vitals for the past 24 hrs:  BP Temp Temp src Pulse Resp SpO2  12/05/20 0607 118/80 99 F (37.2 C) Oral 84 17 100 %  12/05/20 0052 134/89 98.4 F (36.9 C) Oral 87 19 100 %  12/04/20 1943 (!) 160/103  98.6 F (37 C) Oral 83 15 100 %  12/04/20 1800 (!) 162/82 -- -- 84 18 99 %  12/04/20 1735 (!) 148/84 -- -- 84 18 99 %  12/04/20 1502 (!) 172/95 -- -- 80 18 99 %  12/04/20 1015 (!) 158/74 -- -- 80 18 99 %  12/04/20 0856 (!) 169/98 -- -- 82 20 100 %    Recent Labs    12/04/20 0745 12/05/20 0445  WBC 9.9 10.2  HGB 12.7* 11.2*  HCT 38.1* 33.6*  PLT 183 151    Recent Labs    12/04/20 0735 12/05/20 0445   NA 137 138  K 3.3* 4.0  CL 99 104  CO2 25 28  GLUCOSE 120* 98  BUN 9 10  CREATININE 0.71 0.58*  CALCIUM 9.2 7.9*    Blood smear review: None  Pathology: None    Assessment and Plan: [name] is a very nice 60 year old Afro-American male.  He has metastatic prostate cancer.  This is castrate resistant.  Again I am not sure why has this abdominal pain.  Again I do not know what this biliary dilatation is all about.  I would not think this is gallbladder but this is always a possibility.  I still think that he need Gastroenterology to see him and see about a upper endoscopy to see if there is any kind of gastritis in the stomach.  I also think that an MRI of the thoracic spine probably would not be a bad idea to see is anything that might be causing referred pain.  Again I am trying to get him to have the Pluvicto therapy.  I to see if he can be seen by Radiology while he is here to try to move this process along.  I know he will get outstanding care from all the staff up on [address].  I appreciate all their efforts.  Lattie Haw, MD  Jeneen Rinks 1:5-7

## 2020-12-06 DIAGNOSIS — Z515 Encounter for palliative care: Secondary | ICD-10-CM | POA: Diagnosis not present

## 2020-12-06 DIAGNOSIS — C7951 Secondary malignant neoplasm of bone: Secondary | ICD-10-CM | POA: Diagnosis not present

## 2020-12-06 DIAGNOSIS — K219 Gastro-esophageal reflux disease without esophagitis: Secondary | ICD-10-CM | POA: Diagnosis not present

## 2020-12-06 DIAGNOSIS — K838 Other specified diseases of biliary tract: Secondary | ICD-10-CM

## 2020-12-06 DIAGNOSIS — R52 Pain, unspecified: Secondary | ICD-10-CM

## 2020-12-06 DIAGNOSIS — R109 Unspecified abdominal pain: Secondary | ICD-10-CM | POA: Diagnosis not present

## 2020-12-06 DIAGNOSIS — C61 Malignant neoplasm of prostate: Secondary | ICD-10-CM | POA: Diagnosis not present

## 2020-12-06 DIAGNOSIS — R7989 Other specified abnormal findings of blood chemistry: Secondary | ICD-10-CM

## 2020-12-06 DIAGNOSIS — M549 Dorsalgia, unspecified: Secondary | ICD-10-CM | POA: Diagnosis not present

## 2020-12-06 LAB — PSA: Prostatic Specific Antigen: 142.73 ng/mL — ABNORMAL HIGH (ref 0.00–4.00)

## 2020-12-06 MED ORDER — HYDROMORPHONE HCL 1 MG/ML IJ SOLN
1.0000 mg | INTRAMUSCULAR | Status: DC | PRN
Start: 2020-12-06 — End: 2020-12-10
  Administered 2020-12-06 – 2020-12-10 (×11): 2 mg via INTRAVENOUS
  Filled 2020-12-06 (×7): qty 2
  Filled 2020-12-06: qty 1
  Filled 2020-12-06 (×4): qty 2

## 2020-12-06 MED ORDER — LIDOCAINE 5 % EX PTCH
2.0000 | MEDICATED_PATCH | CUTANEOUS | Status: DC
  Administered 2020-12-06 – 2020-12-07 (×2): 2 via TRANSDERMAL
  Filled 2020-12-06 (×5): qty 2

## 2020-12-06 NOTE — Progress Notes (Signed)
Mobility Specialist - Progress Note    12/06/20 1358  Mobility  Activity Ambulated in hall;Ambulated to bathroom  Level of Assistance Standby assist, set-up cues, supervision of patient - no hands on  Assistive Device [employer] Ambulated (ft) 350 ft  Mobility Ambulated with assistance in hallway  Mobility Response Tolerated well  Mobility performed by Mobility specialist  $Mobility charge 1 Mobility   Upon entry pt was willing to ambulate and required no assistance getting OOB. Pt did report 7/10 back pain, but continued with session. Used cane when ambulating 350 ft and returned to room afterwards to use bathroom. Once ready, pt returned to bed and left with call bell at side.   [place] [facility]ist [facility] Phone: [phone] 12/06/20, 2:03 PM

## 2020-12-06 NOTE — Progress Notes (Signed)
PROGRESS NOTE  CHAPIN ARDUINI KXF:[id number] DOB: 1960/08/05 DOA: 12/04/2020 PCP: Robert Bellow, PA-C   LOS: 1 day   Brief Narrative / Interim history: 60 year old male with history of metastatic prostate cancer to multiple sites including bone, chronic pain secondary to metastatic disease, paroxysmal AF not on anticoagulation, comes into the hospital with abdominal and back pain.  He has been reporting worsening mid back pain over the last several days, and has been taking Aleve's around-the-clock.  While eating dinner the day prior to admission has noticed epigastric abdominal pain, sharp, and decided to come to the hospital.  He did have incidental intra and extrahepatic biliary dilatation for which he underwent an MRCP, which was suspect for cholangitis versus cholangiocarcinoma.  He was admitted to the hospital, GI and oncology consulted  Subjective / 24h Interval events: Back pain is excruciating this morning, he cannot find a comfortable spot.  Underwent thoracic spine MRI last night and has been bothering him since, was unable to sleep.  Received IV Dilaudid overnight  Assessment & Plan: Principal Problem Back pain, abdominal pain-patient noted to have osseous metastasis from his prostate cancer, possibly that his back pain got worse due to that.  We will obtain an MRI of the thoracic spine in the area where he complains of the most pain.  He reported some subjective weakness as well.  His abdominal pain is possibly related to his heavy NSAID use -Continue pain regimen.  At home he was taking MS IR alternating with oxycodone every 4 hours.  Resume those here.  Continue IV Dilaudid -Add lidocaine patch -Palliative consulted to assist with pain management, appreciate input  Active Problems Abnormal LFTs-mild, MRCP raise suspicion for cholangitis versus cholangiocarcinoma.  GI consulted, discussed with Dr. Therisa Doyne and cholangitis / cholangiocarcinoma is less likely at this point.  She does  recommend treatment with 5 days of antibiotics because of LFT elevation and also uncertainty.  LFTs could have been raised by heavy NSAID use.  LFTs are much better this morning -GI also recommends repeat abdominal imaging in 4 weeks to rule out a new neoplastic process such as cholangiocarcinoma which is not very obvious right now  Hypokalemia-replete and monitor  Metastatic prostate cancer-appreciate Dr. Antonieta Pert input  Paroxysmal A. fib-low CHA2DS2-VASc's, on aspirin monotherapy  GERD/concern for PUD due to heavy NSAID use-continue PPI   Scheduled Meds:  aspirin  81 mg Oral Daily   Chlorhexidine Gluconate Cloth  6 each Topical Daily   enoxaparin (LOVENOX) injection  40 mg Subcutaneous Q24H   fentaNYL  2 patch Transdermal Q48H   gabapentin  800 mg Oral TID   lidocaine  2 patch Transdermal Q24H   metoprolol tartrate  12.5 mg Oral BID   pantoprazole  40 mg Oral BID   Plecanatide  3 mg Oral Daily   polyethylene glycol  17 g Oral BID   potassium chloride  40 mEq Oral Once   senna  2 tablet Oral BID   sucralfate  1 g Oral TID WC & HS   tamsulosin  0.4 mg Oral Daily   Continuous Infusions:  piperacillin-tazobactam (ZOSYN)  IV 3.375 g (12/06/20 0524)   PRN Meds:.acetaminophen **OR** acetaminophen, cyclobenzaprine, oxyCODONE **OR** HYDROmorphone (DILAUDID) injection, melatonin, morphine, ondansetron **OR** ondansetron (ZOFRAN) IV, polyethylene glycol, sodium chloride flush  Diet Orders (From admission, onward)     Start     Ordered   12/05/20 1001  Diet regular Room service appropriate? No; Fluid consistency: Thin  Diet effective now  Question Answer Comment  Room service appropriate? No   Fluid consistency: Thin      12/05/20 1001            DVT prophylaxis: enoxaparin (LOVENOX) injection 40 mg Start: 12/04/20 2300     Code Status: Full Code  Family Communication: no family at bedside   Status is: Inpatient  Inpatient because of uncontrolled pain, IV  Dilaudid  Level of care: Telemetry  Consultants:  GI Oncology   Procedures:  none  Microbiology  none  Antimicrobials: Zosyn 11/8 >>    Objective: Vitals:   12/05/20 1300 12/05/20 2208 12/06/20 0519 12/06/20 0632  BP: 104/69 107/81  (!) 168/97  Pulse: 72 73  76  Resp: 18 16 16 20   Temp: 98.5 F (36.9 C) 99.1 F (37.3 C)  98.6 F (37 C)  TempSrc: Oral Oral  Oral  SpO2:  97%  100%  Weight:      Height:        Intake/Output Summary (Last 24 hours) at 12/06/2020 1129 Last data filed at 12/05/2020 2225 Gross per 24 hour  Intake 757.83 ml  Output 200 ml  Net 557.83 ml    Filed Weights   12/04/20 0635  Weight: 72.6 kg    Examination:  Constitutional: No distress Eyes: n anicteric ENMT: Moist mucous membranes Neck: normal, supple Respiratory: Clear bilaterally without wheezing or crackles Cardiovascular: Regular rate and rhythm, no murmurs, no edema Abdomen: Soft, NT, ND, bowel sounds positive Musculoskeletal: no clubbing / cyanosis.  Skin: No rashes appreciated Neurologic: No focal deficits  Data Reviewed: I have independently reviewed following labs and imaging studies  CBC: Recent Labs  Lab 12/04/20 0745 12/05/20 0445  WBC 9.9 10.2  NEUTROABS 6.7 6.7  HGB 12.7* 11.2*  HCT 38.1* 33.6*  MCV 89.9 88.9  PLT 183 093    Basic Metabolic Panel: Recent Labs  Lab 12/04/20 0735 12/05/20 0445  NA 137 138  K 3.3* 4.0  CL 99 104  CO2 25 28  GLUCOSE 120* 98  BUN 9 10  CREATININE 0.71 0.58*  CALCIUM 9.2 7.9*  MG 2.3 2.4    Liver Function Tests: Recent Labs  Lab 12/04/20 0735 12/05/20 0445  AST 76* 31  ALT 102* 66*  ALKPHOS 720* 577*  BILITOT 1.1 0.9  PROT 7.9 6.2*  ALBUMIN 4.8 3.8    Coagulation Profile: No results for input(s): INR, PROTIME in the last 168 hours. HbA1C: No results for input(s): HGBA1C in the last 72 hours. CBG: No results for input(s): GLUCAP in the last 168 hours.  Recent Results (from the past 240 hour(s))   Resp Panel by RT-PCR (Flu A&B, Covid) Nasopharyngeal Swab     Status: None   Collection Time: 12/04/20  3:53 PM   Specimen: Nasopharyngeal Swab; Nasopharyngeal(NP) swabs in vial transport medium  Result Value Ref Range Status   SARS Coronavirus 2 by RT PCR NEGATIVE NEGATIVE Final    Comment: (NOTE) SARS-CoV-2 target nucleic acids are NOT DETECTED.  The SARS-CoV-2 RNA is generally detectable in upper respiratory specimens during the acute phase of infection. The lowest concentration of SARS-CoV-2 viral copies this assay can detect is 138 copies/mL. A negative result does not preclude SARS-Cov-2 infection and should not be used as the sole basis for treatment or other patient management decisions. A negative result may occur with  improper specimen collection/handling, submission of specimen other than nasopharyngeal swab, presence of viral mutation(s) within the areas targeted by this assay, and inadequate number of  viral copies(<138 copies/mL). A negative result must be combined with clinical observations, patient history, and epidemiological information. The expected result is Negative.  Fact Sheet for Patients:  EntrepreneurPulse.com.au  Fact Sheet for Healthcare Providers:  IncredibleEmployment.be  This test is no t yet approved or cleared by the [place] FDA and  has been authorized for detection and/or diagnosis of SARS-CoV-2 by FDA under an Emergency Use Authorization (EUA). This EUA will remain  in effect (meaning this test can be used) for the duration of the COVID-19 declaration under Section 564(b)(1) of the Act, 21 U.S.C.section 360bbb-3(b)(1), unless the authorization is terminated  or revoked sooner.       Influenza A by PCR NEGATIVE NEGATIVE Final   Influenza B by PCR NEGATIVE NEGATIVE Final    Comment: (NOTE) The Xpert Xpress SARS-CoV-2/FLU/RSV plus assay is intended as an aid in the diagnosis of influenza from  Nasopharyngeal swab specimens and should not be used as a sole basis for treatment. Nasal washings and aspirates are unacceptable for Xpert Xpress SARS-CoV-2/FLU/RSV testing.  Fact Sheet for Patients: EntrepreneurPulse.com.au  Fact Sheet for Healthcare Providers: IncredibleEmployment.be  This test is not yet approved or cleared by the [place] FDA and has been authorized for detection and/or diagnosis of SARS-CoV-2 by FDA under an Emergency Use Authorization (EUA). This EUA will remain in effect (meaning this test can be used) for the duration of the COVID-19 declaration under Section 564(b)(1) of the Act, 21 U.S.C. section 360bbb-3(b)(1), unless the authorization is terminated or revoked.  Performed at [facility], [address], [place], [place] [address]       Radiology Studies: MR THORACIC SPINE W WO CONTRAST  Result Date: 12/06/2020 CLINICAL DATA:  Initial evaluation for metastatic prostate cancer, surveillance. EXAM: MRI THORACIC WITHOUT AND WITH CONTRAST TECHNIQUE: Multiplanar and multiecho pulse sequences of the thoracic spine were obtained without and with intravenous contrast. CONTRAST:  17mL GADAVIST GADOBUTROL 1 MMOL/ML IV SOLN COMPARISON:  Prior CT from 12/04/2020. FINDINGS: Alignment: Physiologic with preservation of the normal thoracic kyphosis. No listhesis. Vertebrae: Widespread metastatic disease seen throughout the visualized osseous structures. There is involvement of essentially all levels, with diffusely abnormal appearance of the visualized bone marrow. Vertebral body height maintained without associated pathologic fracture. No extra osseous or epidural extension of tumor. Cord: Normal signal and morphology. No epidural or intracanalicular tumor. Paraspinal and other soft tissues: Paraspinous soft tissues demonstrate no acute finding. 3.3 cm lipoma noted within the para median musculature of the left upper back.  Trace layering bilateral pleural effusions noted. Approximate 3.7 cm cystic lesion at the pancreatic tail noted, stable from prior, and better evaluated on prior CTs. Disc levels: T2-3: Disc bulge with small right paracentral to foraminal disc protrusion (series 24, image 5). No significant spinal stenosis. Foramina remain patent. T5-6: Left paracentral disc protrusion indents the left ventral thecal sac (series 24, image 6). No significant canal or foraminal stenosis. Otherwise, no other significant disc pathology seen within the thoracic spine. No other stenosis or evidence for neural impingement. IMPRESSION: 1. Widespread metastatic disease throughout the visualized osseous structures. No associated pathologic fracture. No extra osseous or epidural extension. No neural impingement. 2. Small disc protrusions at T2-3 and T5-6 without significant stenosis. Electronically Signed   By: Jeannine Boga M.D.   On: 12/06/2020 02:32     Marzetta Board, MD, PhD [facility]  Between 7 am - 7 pm I am available, please contact me via Amion (for emergencies) or Securechat (non  urgent messages)  Between 7 pm - 7 am I am not available, please contact night coverage MD/APP via Amion

## 2020-12-06 NOTE — Progress Notes (Signed)
Overall, Ryan Davis is better.  He had the MRI of his back last night.  There is no fracture.  There is no nerve impingement.  At looks like this abdominal issue was a totally separate problem.  He had the MRCP.  This showed the possibility of cholangitis or highly less likely cholangiocarcinoma.  GI does not think he needs an upper endoscopy.  His PSA today was 142.  This is actually stable.  I think this started feeding him again yesterday.  This went okay.  He had a little bit of abdominal pain with eating.  He has had some constipation.  There is no diarrhea.  There are no labs today.  He has had no fever.  He has had no cough or shortness of breath.  His vital signs show temperature of 98.6.  Pulse 76.  Blood pressure 168/97.  His lungs are clear.  He has good air movement bilaterally.  Cardiac exam regular rate and rhythm.  Abdomen is soft.  There may be some tenderness in the right upper quadrant.  Bowel sounds are decreased but present.  There is no fluid wave.  Extremity shows no clubbing, cyanosis or edema.  Again, I just do not think this abdominal issue is anything related to the prostate cancer.  I have not found anything with his labs or x-rays that show prostate cancer as metastatic to the liver.  Gastroenterology is doing a great job.  They do not think he needs an upper endoscopy.  We will just follow along for right now.  Lattie Haw, MD  Psalms 34:8

## 2020-12-06 NOTE — Progress Notes (Signed)
I took 2 fentanyl patches from this patient to do  MRI and he doesn't want me tape them back on,  He wants  two new ones. Notified Olena Heckle NP and received orders for both replacement.

## 2020-12-06 NOTE — Consult Note (Signed)
Palliative Care Consult Note                                  Date: 12/06/2020   Patient Name: Ryan Davis  DOB: May 08, 1960  MRN: [id number]  Age / Sex: 60 y.o., male  PCP: Robert Bellow, PA-C Referring Physician: Caren Griffins, MD  Reason for Consultation: Pain control  HPI/Patient Profile: 60 y.o. 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 He presented to the hospital with worsening abdominal pain and back pain over the previous several days and taking Aleve "around the clock". MRCP completed which was initially suspect for cholangitis versus cholangiocarcinoma though GI consult felt this is less likely. He was admitted on 12/04/2020 with Back pain, abdominal pain, abnormal LFTs, metastatic prostate cancer.   PMT was consulted for pain control in the setting of prostate cancer with mets to bone.  Past Medical History:  Diagnosis Date  . A-fib [facility])    see 03-28-16 admission  . Back pain without radiculopathy 03/28/2016  . Dyspnea    endorses since treatment radiation , still gets SOB occasionally   . Dysrhythmia    afib   . Goals of care, counseling/discussion 06/24/2017  . History of radiation therapy 09/16/16-10/06/16   35 Gy in 14 fractions to the lumbar spine  . Nocturia   . Prostate cancer metastatic to bone ([place]) 03/31/2016  . Prostate cancer metastatic to multiple sites [facility]) 03/31/2016    Subjective:   This NP Ryan Davis reviewed medical records, received report from team, assessed the patient and then meet at the patient's bedside to discuss diagnosis, prognosis, GOC, EOL wishes disposition and options.  I met with the patient at the bedside.   Concept of Palliative Care was introduced as specialized medical care for people and their families living with serious illness.  If focuses on  providing relief from the symptoms and stress of a serious illness.  The goal is to improve quality of life for both the patient and the family. Values and goals of care important to patient and family were attempted to be elicited.  Created space and opportunity for patient  and family to explore thoughts and feelings regarding current medical situation   Natural trajectory and current clinical status were discussed. Questions and concerns addressed. Patient  encouraged to call with questions or concerns.    Today's Discussion: Discussed the patient's current pain sitation. He states that at home his pain has been getting worse. Now the pain that brought him in is significantly worse in his back. Also notes abdominal pain, but we discussed this could very well be due to the "around the clock" Aleve he's been taking (again, indicator of worsening pain) rather than his cancer. He agrees that MS IR makes him nauseated. He feels the oxycodone works well and so does the dilaudid and states "with that I'm actually able to get some sleep." We discussed that we will try and get his pain better controlled while in the hospital and then convert that to an outpatient regimen when he is near discharge. I set reasonable expectations for pain control to a "tolerable level" accepting that we will not be able to completely remove the pain. The goal is tolerable pain that allows him to function. He verbalized understanding.  Review of Systems  Constitutional:  Negative for activity change.  Respiratory:  Negative for shortness of breath.   Cardiovascular:  Negative for chest pain.  Gastrointestinal:  Positive for abdominal pain and nausea (with MS IR).  Musculoskeletal:  Positive for back pain.   Objective:   Primary Diagnoses: Present on Admission: . Intractable abdominal pain . Prostate cancer metastatic to multiple sites [facility]) . Hypokalemia due to excessive gastrointestinal loss of potassium . Dilation of  biliary tract . Abnormal LFTs . AF (paroxysmal atrial fibrillation) ([place]) . GERD without esophagitis . Abdominal pain   Physical Exam Vitals and nursing note reviewed.  Constitutional:      General: He is sleeping. He is not in acute distress.    Appearance: He is cachectic. He is ill-appearing.     Comments: Does not appear to be in pain crisis.  Abdominal:     General: Abdomen is flat.     Palpations: Abdomen is soft.     Tenderness: There is abdominal tenderness (epigastric).  Skin:    General: Skin is warm and dry.  Neurological:     General: No focal deficit present.     Mental Status: He is easily aroused.  Psychiatric:        Mood and Affect: Mood normal.        Behavior: Behavior normal.    Vital Signs:  BP (!) 167/103 (BP Location: Right Arm)   Pulse 68   Temp 98.2 F (36.8 C) (Oral)   Resp 20   Ht 6' 2" (1.88 m)   Wt 72.6 kg   SpO2 100%   BMI 20.54 kg/m   Palliative Assessment/Data: 70%    Advanced Care Planning:   Primary Decision Maker: PATIENT  Code Status/Advance Care Planning: Full code  Decisions/Changes to ACP: None today  Assessment & Plan:   Impression: 60 year old male with prostate CA mets to bone with worsening back pain now also with abdominal pain. Is on significant amount of narcotics at home for his pain. Oncology feels he is doing better and that his abdominal pain is a separate problem unrelated to his cancer. GI has decided to hold off on EGD. PSA noted stable, has restarted a diet. No obvious mets to liver on imaging. MRI of his spine found widespread metastatic disease throughout osseous structures with no pathologic fractures or neural impingement. Pain regimen at home: MS IR alternating with oxycodone every 4 hours. Same regimen in the hospital with prn IV dilaudid, added lidocaine patch. He was able to ambulate with mobility today. Pain appears to be worsening. MS IR causes nausea. He feels oxycodone and dilaudid are helpful  to him. Does not appear overly sedated or somnolent (nurse agrees he's easily aroused even with current narcotic dosing)  SUMMARY OF RECOMMENDATIONS   Stop Morphine IR Continue Fentanyl patch dose for now Continue oxycodone current dose for now Increase IV Dilaudid to 1-2 mg q 3 hours prn breakthrough pain Will reassess tomorrow for further needed adjustments Will discuss possibility of dexamethasone with Dr. Marin Olp for bone mets pain Consider referral to outpatient cancer center palliative clinic if patient agreeable for further [place] and symptom management PMT will continue to follow  Symptom Management:  Stop MS IR Continue Fentanyl and oxycodone at current doses Change IV dilaudid to 1-2 mg q 3 hr prn  Prognosis:  Unable to determine  Discharge Planning:  To Be Determined   Discussed with: Medical team, nursing team, patient   Thank you for allowing [place] to participate  in the care of Ryan Davis PMT will continue to support holistically.  Time Total: 70 min  Greater than 50%  of this time was spent counseling and coordinating care related to the above assessment and plan.  Signed by: Ryan Field, NP Palliative Medicine Team  Team Phone # [phone] (Nights/Weekends)  12/06/2020, 2:49 PM

## 2020-12-07 ENCOUNTER — Other Ambulatory Visit: Payer: Self-pay | Admitting: Nurse Practitioner

## 2020-12-07 ENCOUNTER — Ambulatory Visit
Admit: 2020-12-07 | Discharge: 2020-12-07 | Disposition: A | Discharge status: Home / Self Care | Payer: Medicare Other | Source: Ambulatory Visit | Attending: Radiation Oncology | Admitting: Radiation Oncology

## 2020-12-07 DIAGNOSIS — Z7189 Other specified counseling: Secondary | ICD-10-CM | POA: Diagnosis not present

## 2020-12-07 DIAGNOSIS — R109 Unspecified abdominal pain: Secondary | ICD-10-CM | POA: Diagnosis not present

## 2020-12-07 DIAGNOSIS — Z515 Encounter for palliative care: Secondary | ICD-10-CM | POA: Diagnosis not present

## 2020-12-07 DIAGNOSIS — C7951 Secondary malignant neoplasm of bone: Secondary | ICD-10-CM

## 2020-12-07 DIAGNOSIS — C61 Malignant neoplasm of prostate: Secondary | ICD-10-CM

## 2020-12-07 DIAGNOSIS — K219 Gastro-esophageal reflux disease without esophagitis: Secondary | ICD-10-CM | POA: Diagnosis not present

## 2020-12-07 MED ORDER — POLYETHYLENE GLYCOL 3350 17 G PO PACK
17.0000 g | PACK | Freq: Two times a day (BID) | ORAL | Status: DC
  Administered 2020-12-07 – 2020-12-09 (×6): 17 g via ORAL
  Filled 2020-12-07 (×6): qty 1

## 2020-12-07 MED ORDER — DEXAMETHASONE 2 MG PO TABS
1.0000 mg | ORAL_TABLET | Freq: Three times a day (TID) | ORAL | Status: DC
Start: 2020-12-07 — End: 2020-12-08
  Administered 2020-12-07 – 2020-12-08 (×3): 1 mg via ORAL
  Filled 2020-12-07 (×3): qty 1

## 2020-12-07 MED ORDER — LACTULOSE 10 GM/15ML PO SOLN
20.0000 g | Freq: Once | ORAL | Status: AC
  Administered 2020-12-07: 20 g via ORAL
  Filled 2020-12-07: qty 30

## 2020-12-07 NOTE — Progress Notes (Addendum)
I very much appreciate Palliative care coming by to try to help out.  I know this is quite challenging.  Still not sure exactly how much or what Mr. Mogel takes for pain at home.  We will be nice to try to get him on a stable regimen.  I am awaiting Pluvicto therapy.  I am not sure as to when this will happen.  Maybe, we can see if you have any more radiation treatments.  I think he has had past radiation.  I will have to speak with Radiation Oncology to see if they may be able to help out.  Maybe since he has shows extensive bone mets, he might be candidate for Xofigo or Quadramet therapy.  I think this is a radionucleotide bone metastasis therapy.  I do not think he had any lab work done today.  His abdominal pain is doing okay.  I think that this seems to come and go.  He is eating.  He is constipated.  We will make sure he gets on a laxative.  I do appreciate the Mobility Specialist also help on how.  He has had no fever.  His vital signs are temperature of 98.2.  Pulse 73.  Blood pressure 97/61.  His lungs sound clear bilaterally.  He has good air movement.  Cardiac exam regular rate and rhythm.  Abdomen is soft.  Bowel sounds are clearly present.  There is no distention.  There is still maybe a little bit of tenderness and the right upper quadrant.  Extremities shows no clubbing, cyanosis or edema.  Pain control clearly is the primary goal here.  This will definitely be improved with Pluvicto.  Again, I am not sure when nuclear medicine will be able to set him up for this.  I know they have been trying to get hold of him without much success.  I will speak with Radiation Oncology to see if they may have any options for [place].  I would suspect that they do with their bone radionucleotide therapy.  I do appreciate the outstanding care he is getting from all the staff on [place], MD  2 Christia Reading 2:3-4

## 2020-12-07 NOTE — Plan of Care (Signed)

## 2020-12-07 NOTE — Plan of Care (Signed)

## 2020-12-07 NOTE — Progress Notes (Signed)
Daily Progress Note   Patient Name: Ryan Davis       Date: 12/07/2020 DOB: 1960/08/10  Age: 60 y.o. MRN#: [id number] Attending Physician: Caren Griffins, MD Primary Care Physician: Rip Harbour Admit Date: 12/04/2020 Length of Stay: 2 days  Reason for Consultation/Follow-up: Pain control  HPI/Patient Profile:  60 y.o. 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 He presented to the hospital with worsening abdominal pain and back pain over the previous several days and taking Aleve "around the clock". MRCP completed which was initially suspect for cholangitis versus cholangiocarcinoma though GI consult felt this is less likely. He was admitted on 12/04/2020 with Back pain, abdominal pain, abnormal LFTs, metastatic prostate cancer.    PMT was consulted for pain control in the setting of prostate cancer with mets to bone.  Subjective:   Subjective: Chart Reviewed. Updates received. Patient Assessed. Created space and opportunity for patient  and family to explore thoughts and feelings regarding current medical situation.  Today's Discussion: I met with the patient at the bedside.  He states he is having an okay day.  When asked about his pain he states it is better than yesterday with the new medication changes.  He does occasionally have a flare of pain if he tries to get up.  I reinforced that we would likely not be able to completely eliminate his pain, but rather make it tolerable.  He agrees that today his pain has been tolerable for him.  No further nausea since discontinuing MS IR.  Discussed referral to outpatient palliative clinic embedded in the cancer center and he is agreeable.  We also discussed his faith and desire for chaplaincy support.  I provided emotional and general support with therapeutic listening, empathy, and other techniques.  I  answered all his questions and addressed all concerns.  Review of Systems  Gastrointestinal:  Positive for abdominal pain (mild intermittent, improved). Negative for nausea and vomiting.  Musculoskeletal:  Positive for back pain (Improved over yesterday).   Objective:   Vital Signs:  BP 97/61 (BP Location: Right Arm)   Pulse 73   Temp 98.2 F (36.8 C) (Oral)   Resp 17   Ht _0  (1.88 m)   Wt 73.5 kg   SpO2 100%   BMI 20.80 kg/m   Physical Exam: Physical Exam  Palliative Assessment/Data: 60%   Assessment & Plan:   Impression: Present on Admission:  Intractable abdominal pain  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[place])  Hypokalemia due to excessive gastrointestinal loss of potassium  Dilation of biliary tract  Abnormal LFTs  AF (paroxysmal atrial fibrillation) ([facility])  GERD without esophagitis  Abdominal pain  60 year old male with prostate CA mets to bone with worsening back pain now also with abdominal pain. Is on significant amount of narcotics at home for his pain. Oncology feels he is doing better and that his abdominal pain is a separate problem unrelated to his cancer. GI has decided to hold off on EGD. PSA noted stable, has restarted a diet. No obvious mets to liver on imaging. MRI of his spine found widespread metastatic disease throughout osseous structures with no pathologic fractures or neural impingement. Pain regimen at home: MS IR alternating with oxycodone every 4 hours. Same regimen in the hospital with prn IV dilaudid, added lidocaine patch. He was able to ambulate with mobility yesterday. Adjustments  were made to his pain regimen (see consult note) and today he feels his pain is improved compared to yesterday. Does hurt more when he tries to get up.  Overall his pain is improved today.  I will plan to not make any additional changes other than add addition of dexamethasone as an adjunct pain management therapy.  We will follow-up tomorrow to see if he  continues to do well on this current regimen.  SUMMARY OF RECOMMENDATIONS   Continue Fentanyl patch dose Continue current oxycodone dose Continue current dilaudid dose as it seems to be effective Begin dexamethasone 1 mg every 8 hours (discussed with and cleared by oncology, Dr. Marin Olp) Referral to outpatient [facility] PMT will continue to follow  Code Status: Full code  Prognosis: Unable to determine  Discharge Planning: To Be Determined  Discussed with: Medical team, nursing team, patient  Thank you for allowing [place] to participate in the care of Ryan Davis PMT will continue to support holistically.  Time Total: 35 min  Visit consisted of counseling and education dealing with the complex and emotionally intense issues of symptom management and palliative care in the setting of serious and potentially life-threatening illness. Greater than 50%  of this time was spent counseling and coordinating care related to the above assessment and plan.  Walden Field, NP Palliative Medicine Team  Team Phone # [phone] (Nights/Weekends)  09/25/2020, 8:17 AM

## 2020-12-07 NOTE — Progress Notes (Signed)
Notified on call provider about patient refusing Lovenox injection.

## 2020-12-07 NOTE — Progress Notes (Signed)
PROGRESS NOTE  Ryan Davis:[id number] DOB: [birth year] DOA: 12/04/2020 PCP: Robert Bellow, PA-C   LOS: 2 days   Brief Narrative / Interim history: 60 year old male with history of metastatic prostate cancer to multiple sites including bone, chronic pain secondary to metastatic disease, paroxysmal AF not on anticoagulation, comes into the hospital with abdominal and back pain.  He has been reporting worsening mid back pain over the last several days, and has been taking Aleve's around-the-clock.  While eating dinner the day prior to admission has noticed epigastric abdominal pain, sharp, and decided to come to the hospital.  He did have incidental intra and extrahepatic biliary dilatation for which he underwent an MRCP, which was suspect for cholangitis versus cholangiocarcinoma.  He was admitted to the hospital, GI and oncology consulted  Subjective / 24h Interval events: Slept better overnight.  His pain is a little bit better.  He was seen by palliative yesterday  Assessment & Plan: Principal Problem Back pain, abdominal pain-patient noted to have osseous metastasis from his prostate cancer, possibly that his back pain got worse due to that.  We will obtain an MRI of the thoracic spine in the area where he complains of the most pain.  He reported some subjective weakness as well.  His abdominal pain is possibly related to his heavy NSAID use -Palliative care consulted, appreciate input.  Patient has been placed on as needed oxycodone, Dilaudid.  His morphine IR has been discontinued as it was making him nauseous  Active Problems Abnormal LFTs-mild, MRCP raise suspicion for cholangitis versus cholangiocarcinoma.  GI consulted, discussed with Dr. Therisa Doyne and cholangitis / cholangiocarcinoma is less likely at this point.  She does recommend treatment with 5 days of antibiotics (today is day #3) because of LFT elevation and also uncertainty.  LFTs could have been raised by heavy NSAID use.  LFTs  are much better this morning -GI also recommends repeat abdominal imaging in 4 weeks to rule out a new neoplastic process such as cholangiocarcinoma which is not very obvious right now  Hypokalemia-replete and monitor  Metastatic prostate cancer-appreciate Dr. Antonieta Pert input  Paroxysmal A. fib-low CHA2DS2-VASc's, on aspirin monotherapy  GERD/concern for PUD due to heavy NSAID use-continue PPI   Scheduled Meds:  aspirin  81 mg Oral Daily   Chlorhexidine Gluconate Cloth  6 each Topical Daily   enoxaparin (LOVENOX) injection  40 mg Subcutaneous Q24H   fentaNYL  2 patch Transdermal Q48H   gabapentin  800 mg Oral TID   lidocaine  2 patch Transdermal Q24H   metoprolol tartrate  12.5 mg Oral BID   pantoprazole  40 mg Oral BID   Plecanatide  3 mg Oral Daily   polyethylene glycol  17 g Oral BID   senna  2 tablet Oral BID   sucralfate  1 g Oral TID WC & HS   tamsulosin  0.4 mg Oral Daily   Continuous Infusions:  piperacillin-tazobactam (ZOSYN)  IV 3.375 g (12/07/20 1213)   PRN Meds:.acetaminophen **OR** acetaminophen, cyclobenzaprine, HYDROmorphone (DILAUDID) injection, melatonin, ondansetron **OR** ondansetron (ZOFRAN) IV, oxyCODONE **OR** [DISCONTINUED]  HYDROmorphone (DILAUDID) injection, sodium chloride flush  Diet Orders (From admission, onward)     Start     Ordered   12/05/20 1001  Diet regular Room service appropriate? No; Fluid consistency: Thin  Diet effective now       Question Answer Comment  Room service appropriate? No   Fluid consistency: Thin      12/05/20 1001  DVT prophylaxis: enoxaparin (LOVENOX) injection 40 mg Start: 12/04/20 2300     Code Status: Full Code  Family Communication: no family at bedside   Status is: Inpatient  Inpatient because of uncontrolled pain, IV Dilaudid  Level of care: Telemetry  Consultants:  GI Oncology   Procedures:  none  Microbiology  none  Antimicrobials: Zosyn 11/8 >>    Objective: Vitals:    12/06/20 2012 12/06/20 2255 12/07/20 0546 12/07/20 1233  BP: 100/64 117/85 97/61 105/75  Pulse: 84 87 73 75  Resp: 14  17   Temp: 98.5 F (36.9 C)  98.2 F (36.8 C) 98.9 F (37.2 C)  TempSrc: Oral  Oral Oral  SpO2: 99%  100% 98%  Weight:   73.5 kg   Height:        Intake/Output Summary (Last 24 hours) at 12/07/2020 1310 Last data filed at 12/07/2020 0900 Gross per 24 hour  Intake 990.34 ml  Output 850 ml  Net 140.34 ml    Filed Weights   12/04/20 0635 12/07/20 0546  Weight: 72.6 kg 73.5 kg    Examination:  Constitutional: No distress, in bed Eyes: No scleral icterus ENMT: Moist mucous membranes Neck: normal, supple Respiratory: Clear bilaterally, no wheezing or crackles Cardiovascular: Regular rate and rhythm, no murmurs, no edema Abdomen: Soft, nontender, nondistended, bowel sounds positive Musculoskeletal: no clubbing / cyanosis.  Skin: No rashes seen Neurologic: Nonfocal, equal strength  Data Reviewed: I have independently reviewed following labs and imaging studies  CBC: Recent Labs  Lab 12/04/20 0745 12/05/20 0445  WBC 9.9 10.2  NEUTROABS 6.7 6.7  HGB 12.7* 11.2*  HCT 38.1* 33.6*  MCV 89.9 88.9  PLT 183 161    Basic Metabolic Panel: Recent Labs  Lab 12/04/20 0735 12/05/20 0445  NA 137 138  K 3.3* 4.0  CL 99 104  CO2 25 28  GLUCOSE 120* 98  BUN 9 10  CREATININE 0.71 0.58*  CALCIUM 9.2 7.9*  MG 2.3 2.4    Liver Function Tests: Recent Labs  Lab 12/04/20 0735 12/05/20 0445  AST 76* 31  ALT 102* 66*  ALKPHOS 720* 577*  BILITOT 1.1 0.9  PROT 7.9 6.2*  ALBUMIN 4.8 3.8    Coagulation Profile: No results for input(s): INR, PROTIME in the last 168 hours. HbA1C: No results for input(s): HGBA1C in the last 72 hours. CBG: No results for input(s): GLUCAP in the last 168 hours.  Recent Results (from the past 240 hour(s))  Resp Panel by RT-PCR (Flu A&B, Covid) Nasopharyngeal Swab     Status: None   Collection Time: 12/04/20  3:53 PM    Specimen: Nasopharyngeal Swab; Nasopharyngeal(NP) swabs in vial transport medium  Result Value Ref Range Status   SARS Coronavirus 2 by RT PCR NEGATIVE NEGATIVE Final    Comment: (NOTE) SARS-CoV-2 target nucleic acids are NOT DETECTED.  The SARS-CoV-2 RNA is generally detectable in upper respiratory specimens during the acute phase of infection. The lowest concentration of SARS-CoV-2 viral copies this assay can detect is 138 copies/mL. A negative result does not preclude SARS-Cov-2 infection and should not be used as the sole basis for treatment or other patient management decisions. A negative result may occur with  improper specimen collection/handling, submission of specimen other than nasopharyngeal swab, presence of viral mutation(s) within the areas targeted by this assay, and inadequate number of viral copies(<138 copies/mL). A negative result must be combined with clinical observations, patient history, and epidemiological information. The expected result is Negative.  Fact  Sheet for Patients:  EntrepreneurPulse.com.au  Fact Sheet for Healthcare Providers:  IncredibleEmployment.be  This test is no t yet approved or cleared by the [place] FDA and  has been authorized for detection and/or diagnosis of SARS-CoV-2 by FDA under an Emergency Use Authorization (EUA). This EUA will remain  in effect (meaning this test can be used) for the duration of the COVID-19 declaration under Section 564(b)(1) of the Act, 21 U.S.C.section 360bbb-3(b)(1), unless the authorization is terminated  or revoked sooner.       Influenza A by PCR NEGATIVE NEGATIVE Final   Influenza B by PCR NEGATIVE NEGATIVE Final    Comment: (NOTE) The Xpert Xpress SARS-CoV-2/FLU/RSV plus assay is intended as an aid in the diagnosis of influenza from Nasopharyngeal swab specimens and should not be used as a sole basis for treatment. Nasal washings and aspirates are  unacceptable for Xpert Xpress SARS-CoV-2/FLU/RSV testing.  Fact Sheet for Patients: EntrepreneurPulse.com.au  Fact Sheet for Healthcare Providers: IncredibleEmployment.be  This test is not yet approved or cleared by the [place] FDA and has been authorized for detection and/or diagnosis of SARS-CoV-2 by FDA under an Emergency Use Authorization (EUA). This EUA will remain in effect (meaning this test can be used) for the duration of the COVID-19 declaration under Section 564(b)(1) of the Act, 21 U.S.C. section 360bbb-3(b)(1), unless the authorization is terminated or revoked.  Performed at [facility], [address] [place], [place] [address]       Radiology Studies: No results found.   Marzetta Board, MD, PhD [facility]  Between 7 am - 7 pm I am available, please contact me via Amion (for emergencies) or Securechat (non urgent messages)  Between 7 pm - 7 am I am not available, please contact night coverage MD/APP via Amion

## 2020-12-07 NOTE — Progress Notes (Signed)
   12/07/20 1300  Mobility  Activity Ambulated in hall  Level of Assistance Modified independent, requires aide device or extra time  Assistive Device Other (Comment) (IV pole)  Distance Ambulated (ft) 500 ft  Mobility Ambulated with assistance in hallway  Mobility performed by Mobility specialist  $Mobility charge 1 Mobility   Pt agreeable to mobilize this afternoon. Ambulated about 583ft while pushing IV pole in hall, tolerated well. Pt typically uses a cane but states he has been using the IV pole in place of his cane since being hooked up. No complaints. Let pt in bed with call bell at side. RN notified of session.   [place] Specialist Acute Rehab Services Office: [phone]

## 2020-12-08 DIAGNOSIS — I480 Paroxysmal atrial fibrillation: Secondary | ICD-10-CM

## 2020-12-08 DIAGNOSIS — R109 Unspecified abdominal pain: Secondary | ICD-10-CM | POA: Diagnosis not present

## 2020-12-08 DIAGNOSIS — G893 Neoplasm related pain (acute) (chronic): Principal | ICD-10-CM

## 2020-12-08 DIAGNOSIS — R7989 Other specified abnormal findings of blood chemistry: Secondary | ICD-10-CM | POA: Diagnosis not present

## 2020-12-08 DIAGNOSIS — R52 Pain, unspecified: Secondary | ICD-10-CM | POA: Diagnosis not present

## 2020-12-08 DIAGNOSIS — R1084 Generalized abdominal pain: Secondary | ICD-10-CM

## 2020-12-08 DIAGNOSIS — Z515 Encounter for palliative care: Secondary | ICD-10-CM | POA: Diagnosis not present

## 2020-12-08 DIAGNOSIS — Z7189 Other specified counseling: Secondary | ICD-10-CM | POA: Diagnosis not present

## 2020-12-08 LAB — CBC WITH DIFFERENTIAL/PLATELET
Abs Immature Granulocytes: 0.5 10*3/uL — ABNORMAL HIGH (ref 0.00–0.07)
Basophils Absolute: 0.1 10*3/uL (ref 0.0–0.1)
Basophils Relative: 1 %
Eosinophils Absolute: 0.1 10*3/uL (ref 0.0–0.5)
Eosinophils Relative: 1 %
HCT: 37 % — ABNORMAL LOW (ref 39.0–52.0)
Hemoglobin: 12 g/dL — ABNORMAL LOW (ref 13.0–17.0)
Immature Granulocytes: 5 %
Lymphocytes Relative: 21 %
Lymphs Abs: 2.2 10*3/uL (ref 0.7–4.0)
MCH: 29.4 pg (ref 26.0–34.0)
MCHC: 32.4 g/dL (ref 30.0–36.0)
MCV: 90.7 fL (ref 80.0–100.0)
Monocytes Absolute: 0.9 10*3/uL (ref 0.1–1.0)
Monocytes Relative: 8 %
Neutro Abs: 6.7 10*3/uL (ref 1.7–7.7)
Neutrophils Relative %: 64 %
Platelets: 156 10*3/uL (ref 150–400)
RBC: 4.08 MIL/uL — ABNORMAL LOW (ref 4.22–5.81)
RDW: 17.3 % — ABNORMAL HIGH (ref 11.5–15.5)
WBC: 10.4 10*3/uL (ref 4.0–10.5)
nRBC: 0.4 % — ABNORMAL HIGH (ref 0.0–0.2)

## 2020-12-08 LAB — COMPREHENSIVE METABOLIC PANEL
ALT: 67 U/L — ABNORMAL HIGH (ref 0–44)
AST: 45 U/L — ABNORMAL HIGH (ref 15–41)
Albumin: 4.3 g/dL (ref 3.5–5.0)
Alkaline Phosphatase: 682 U/L — ABNORMAL HIGH (ref 38–126)
Anion gap: 8 (ref 5–15)
BUN: 8 mg/dL (ref 6–20)
CO2: 23 mmol/L (ref 22–32)
Calcium: 8.9 mg/dL (ref 8.9–10.3)
Chloride: 103 mmol/L (ref 98–111)
Creatinine, Ser: 0.62 mg/dL (ref 0.61–1.24)
GFR, Estimated: >60 mL/min (ref >60)
Glucose, Bld: 120 mg/dL — ABNORMAL HIGH (ref 70–99)
Potassium: 3.9 mmol/L (ref 3.5–5.1)
Sodium: 134 mmol/L — ABNORMAL LOW (ref 135–145)
Total Bilirubin: 1.2 mg/dL (ref 0.3–1.2)
Total Protein: 6.8 g/dL (ref 6.5–8.1)

## 2020-12-08 MED ORDER — FENTANYL 75 MCG/HR TD PT72: 1.0000 | MEDICATED_PATCH | TRANSDERMAL | Status: DC

## 2020-12-08 MED ORDER — FENTANYL 100 MCG/HR TD PT72
2.0000 | MEDICATED_PATCH | TRANSDERMAL | Status: DC
  Filled 2020-12-08: qty 1

## 2020-12-08 MED ORDER — FENTANYL 100 MCG/HR TD PT72: 1.0000 | MEDICATED_PATCH | TRANSDERMAL | Status: DC

## 2020-12-08 MED ORDER — FENTANYL 75 MCG/HR TD PT72
1.0000 | MEDICATED_PATCH | TRANSDERMAL | Status: DC
  Administered 2020-12-08 – 2020-12-10 (×2): 1 via TRANSDERMAL
  Filled 2020-12-08 (×2): qty 1

## 2020-12-08 MED ORDER — FENTANYL 100 MCG/HR TD PT72
2.0000 | MEDICATED_PATCH | TRANSDERMAL | Status: DC
  Administered 2020-12-09: 2 via TRANSDERMAL
  Filled 2020-12-08: qty 2

## 2020-12-08 MED ORDER — OXYCODONE HCL 5 MG PO TABS: 30.0000 mg | ORAL_TABLET | ORAL | Status: DC | PRN

## 2020-12-08 MED ORDER — DEXAMETHASONE 2 MG PO TABS
2.0000 mg | ORAL_TABLET | Freq: Three times a day (TID) | ORAL | Status: DC
  Administered 2020-12-08 – 2020-12-10 (×6): 2 mg via ORAL
  Filled 2020-12-08 (×6): qty 1

## 2020-12-08 MED ORDER — HYDROMORPHONE HCL 1 MG/ML IJ SOLN
1.0000 mg | Freq: Once | INTRAMUSCULAR | Status: AC
  Administered 2020-12-08: 1 mg via INTRAVENOUS

## 2020-12-08 NOTE — Progress Notes (Signed)
PROGRESS NOTE  Ryan Davis VZC:[id number] DOB: [birth year] DOA: 12/04/2020 PCP: Robert Bellow, PA-C   LOS: 3 days   Brief Narrative / Interim history: 60 year old male with history of metastatic prostate cancer to multiple sites including bone, chronic pain secondary to metastatic disease, paroxysmal AF not on anticoagulation, comes into the hospital with abdominal and back pain.  He has been reporting worsening mid back pain over the last several days, and has been taking Aleve's around-the-clock.  While eating dinner the day prior to admission has noticed epigastric abdominal pain, sharp, and decided to come to the hospital.  He did have incidental intra and extrahepatic biliary dilatation for which he underwent an MRCP, which was suspect for cholangitis versus cholangiocarcinoma.  He was admitted to the hospital, GI and oncology consulted  Subjective / 24h Interval events: Writhing in pain this morning.  Tells me he was not able to sleep overnight.  Just got back from the bathroom and he is curled up in bed  Assessment & Plan: Principal Problem Back pain, abdominal pain, cancer-related pain-patient noted to have osseous metastasis from his prostate cancer, possibly that his back pain got worse due to that.  We will obtain an MRI of the thoracic spine in the area where he complains of the most pain.  He reported some subjective weakness as well.  His abdominal pain is possibly related to his heavy NSAID use -Palliative care consulted, appreciate input.  Continue to adjust pain medication per palliative given poorly controlled pain  Active Problems Abnormal LFTs-mild, MRCP raise suspicion for cholangitis versus cholangiocarcinoma.  GI consulted, discussed with Dr. Therisa Doyne and cholangitis / cholangiocarcinoma is less likely at this point.  She does recommend treatment with 5 days of antibiotics (today is day #4) because of LFT elevation and also uncertainty.  LFTs could have been raised by heavy  NSAID use.  LFTs improving -GI also recommends repeat abdominal imaging in 4 weeks to rule out a new neoplastic process such as cholangiocarcinoma which is not very obvious right now  Hypokalemia-replete and monitor, normalized this morning  Metastatic prostate cancer-appreciate Dr. Antonieta Pert input  Paroxysmal A. fib-low CHA2DS2-VASc's, on aspirin monotherapy  GERD/concern for PUD due to heavy NSAID use-continue PPI   Scheduled Meds:  aspirin  81 mg Oral Daily   Chlorhexidine Gluconate Cloth  6 each Topical Daily   dexamethasone  1 mg Oral Q8H   enoxaparin (LOVENOX) injection  40 mg Subcutaneous Q24H   fentaNYL  2 patch Transdermal Q48H   gabapentin  800 mg Oral TID   lidocaine  2 patch Transdermal Q24H   metoprolol tartrate  12.5 mg Oral BID   pantoprazole  40 mg Oral BID   Plecanatide  3 mg Oral Daily   polyethylene glycol  17 g Oral BID   senna  2 tablet Oral BID   sucralfate  1 g Oral TID WC & HS   tamsulosin  0.4 mg Oral Daily   Continuous Infusions:  piperacillin-tazobactam (ZOSYN)  IV 12.5 mL/hr at 12/08/20 0811   PRN Meds:.acetaminophen **OR** acetaminophen, cyclobenzaprine, HYDROmorphone (DILAUDID) injection, melatonin, ondansetron **OR** ondansetron (ZOFRAN) IV, oxyCODONE **OR** [DISCONTINUED]  HYDROmorphone (DILAUDID) injection, sodium chloride flush  Diet Orders (From admission, onward)     Start     Ordered   12/05/20 1001  Diet regular Room service appropriate? No; Fluid consistency: Thin  Diet effective now       Question Answer Comment  Room service appropriate? No   Fluid consistency: Thin  12/05/20 1001            DVT prophylaxis: enoxaparin (LOVENOX) injection 40 mg Start: 12/04/20 2300     Code Status: Full Code  Family Communication: no family at bedside   Status is: Inpatient  Inpatient because of uncontrolled pain, IV Dilaudid  Level of care: Telemetry  Consultants:  GI Oncology   Procedures:  none  Microbiology   none  Antimicrobials: Zosyn 11/8 >>    Objective: Vitals:   12/07/20 0546 12/07/20 1233 12/07/20 2048 12/08/20 0552  BP: 97/61 105/75 (!) 166/99 (!) 176/99  Pulse: 73 75 84 91  Resp: 17 12 14 20   Temp: 98.2 F (36.8 C) 98.9 F (37.2 C) 98.5 F (36.9 C) 98.3 F (36.8 C)  TempSrc: Oral Oral Oral Oral  SpO2: 100% 98% 100% 100%  Weight: 73.5 kg     Height:        Intake/Output Summary (Last 24 hours) at 12/08/2020 1130 Last data filed at 12/08/2020 6734 Gross per 24 hour  Intake 1002.81 ml  Output --  Net 1002.81 ml    Filed Weights   12/04/20 0635 12/07/20 0546  Weight: 72.6 kg 73.5 kg    Examination:  Constitutional: Appears uncomfortable, in bed Eyes: Anicteric ENMT: Moist mucous membranes Neck: normal, supple Respiratory: Clear bilaterally without wheezing or crackles Cardiovascular: Regular rate and rhythm, no murmurs Abdomen: Soft, NT, ND, bowel sounds positive Musculoskeletal: no clubbing / cyanosis.  Skin: No rashes seen Neurologic: Nonfocal, equal strength  Data Reviewed: I have independently reviewed following labs and imaging studies  CBC: Recent Labs  Lab 12/04/20 0745 12/05/20 0445 12/08/20 0309  WBC 9.9 10.2 10.4  NEUTROABS 6.7 6.7 6.7  HGB 12.7* 11.2* 12.0*  HCT 38.1* 33.6* 37.0*  MCV 89.9 88.9 90.7  PLT 183 151 193    Basic Metabolic Panel: Recent Labs  Lab 12/04/20 0735 12/05/20 0445 12/08/20 0309  NA 137 138 134*  K 3.3* 4.0 3.9  CL 99 104 103  CO2 25 28 23   GLUCOSE 120* 98 120*  BUN 9 10 8   CREATININE 0.71 0.58* 0.62  CALCIUM 9.2 7.9* 8.9  MG 2.3 2.4  --     Liver Function Tests: Recent Labs  Lab 12/04/20 0735 12/05/20 0445 12/08/20 0309  AST 76* 31 45*  ALT 102* 66* 67*  ALKPHOS 720* 577* 682*  BILITOT 1.1 0.9 1.2  PROT 7.9 6.2* 6.8  ALBUMIN 4.8 3.8 4.3    Coagulation Profile: No results for input(s): INR, PROTIME in the last 168 hours. HbA1C: No results for input(s): HGBA1C in the last 72  hours. CBG: No results for input(s): GLUCAP in the last 168 hours.  Recent Results (from the past 240 hour(s))  Resp Panel by RT-PCR (Flu A&B, Covid) Nasopharyngeal Swab     Status: None   Collection Time: 12/04/20  3:53 PM   Specimen: Nasopharyngeal Swab; Nasopharyngeal(NP) swabs in vial transport medium  Result Value Ref Range Status   SARS Coronavirus 2 by RT PCR NEGATIVE NEGATIVE Final    Comment: (NOTE) SARS-CoV-2 target nucleic acids are NOT DETECTED.  The SARS-CoV-2 RNA is generally detectable in upper respiratory specimens during the acute phase of infection. The lowest concentration of SARS-CoV-2 viral copies this assay can detect is 138 copies/mL. A negative result does not preclude SARS-Cov-2 infection and should not be used as the sole basis for treatment or other patient management decisions. A negative result may occur with  improper specimen collection/handling, submission of specimen other  than nasopharyngeal swab, presence of viral mutation(s) within the areas targeted by this assay, and inadequate number of viral copies(<138 copies/mL). A negative result must be combined with clinical observations, patient history, and epidemiological information. The expected result is Negative.  Fact Sheet for Patients:  EntrepreneurPulse.com.au  Fact Sheet for Healthcare Providers:  IncredibleEmployment.be  This test is no t yet approved or cleared by the [place] FDA and  has been authorized for detection and/or diagnosis of SARS-CoV-2 by FDA under an Emergency Use Authorization (EUA). This EUA will remain  in effect (meaning this test can be used) for the duration of the COVID-19 declaration under Section 564(b)(1) of the Act, 21 U.S.C.section 360bbb-3(b)(1), unless the authorization is terminated  or revoked sooner.       Influenza A by PCR NEGATIVE NEGATIVE Final   Influenza B by PCR NEGATIVE NEGATIVE Final    Comment:  (NOTE) The Xpert Xpress SARS-CoV-2/FLU/RSV plus assay is intended as an aid in the diagnosis of influenza from Nasopharyngeal swab specimens and should not be used as a sole basis for treatment. Nasal washings and aspirates are unacceptable for Xpert Xpress SARS-CoV-2/FLU/RSV testing.  Fact Sheet for Patients: EntrepreneurPulse.com.au  Fact Sheet for Healthcare Providers: IncredibleEmployment.be  This test is not yet approved or cleared by the [place] FDA and has been authorized for detection and/or diagnosis of SARS-CoV-2 by FDA under an Emergency Use Authorization (EUA). This EUA will remain in effect (meaning this test can be used) for the duration of the COVID-19 declaration under Section 564(b)(1) of the Act, 21 U.S.C. section 360bbb-3(b)(1), unless the authorization is terminated or revoked.  Performed at [facility], [address], [place], [place] [address]       Radiology Studies: No results found.   Marzetta Board, MD, PhD [facility]  Between 7 am - 7 pm I am available, please contact me via Amion (for emergencies) or Securechat (non urgent messages)  Between 7 pm - 7 am I am not available, please contact night coverage MD/APP via Amion

## 2020-12-08 NOTE — Progress Notes (Signed)
Daily Progress Note   Patient Name: Ryan Davis       Date: 12/08/2020 DOB: 1960/08/30  Age: 60 y.o. MRN#: [id number] Attending Physician: Caren Griffins, MD Primary Care Physician: Rip Harbour Admit Date: 12/04/2020 Length of Stay: 3 days  Reason for Consultation/Follow-up: Pain control  HPI/Patient Profile:  60 y.o. 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 He presented to the hospital with worsening abdominal pain and back pain over the previous several days and taking Aleve "around the clock". MRCP completed which was initially suspect for cholangitis versus cholangiocarcinoma though GI consult felt this is less likely. He was admitted on 12/04/2020 with Back pain, abdominal pain, abnormal LFTs, metastatic prostate cancer.    PMT was consulted for pain control in the setting of prostate cancer with mets to bone.  Subjective:   Subjective: Chart Reviewed. Updates received. Patient Assessed. Created space and opportunity for patient  and family to explore thoughts and feelings regarding current medical situation.  Today's Discussion: Confirms he had a bad night last night with severe pain, typically 10 out of 10.  He states his pain is not much better today.  However, he does not appear to be in acute pain distress or crisis.  Pain confirmed to be mostly in his back consistent with his extensive bone metastasis.  We discussed the plan to adjust his long-term pain medication based on calculations from his breakthrough pain dose requirements.  He verbalized understanding.  I provided general and emotional support through therapeutic listening, empathy, and other techniques.  I answered all questions and addressed all concerns.  Review of Systems  Constitutional:  Positive for activity change.  Respiratory:  Negative for shortness of breath.    Cardiovascular:  Negative for chest pain.  Musculoskeletal:  Positive for back pain (10/10).   Objective:   Vital Signs:  BP (!) 158/105 (BP Location: Right Arm)   Pulse 88   Temp 98.6 F (37 C) (Oral)   Resp 16   Ht 6\' 2"  (1.88 m)   Wt 73.5 kg   SpO2 100%   BMI 20.80 kg/m   Physical Exam: Physical Exam Constitutional:      General: He is not in acute distress.    Appearance: He is ill-appearing.  HENT:     Head: Normocephalic and atraumatic.  Cardiovascular:     Rate and Rhythm: Normal rate.     Heart sounds: No murmur heard. Pulmonary:     Effort: Pulmonary effort is normal. No respiratory distress.     Breath sounds: Normal breath sounds. No wheezing or rhonchi.  Abdominal:     General: Abdomen is flat.     Palpations: Abdomen is soft.  Skin:    General: Skin is warm and dry.  Neurological:     General: No focal deficit present.     Mental Status: He is alert and oriented to person, place, and time.  Psychiatric:        Mood and Affect: Mood normal.        Behavior: Behavior normal.    Palliative Assessment/Data: 50%   Assessment & Plan:   Impression: Present on Admission:  Intractable abdominal pain  Prostate cancer metastatic to multiple sites [facility])  Hypokalemia due to excessive gastrointestinal loss of potassium  Dilation of biliary tract  Abnormal LFTs  AF (paroxysmal atrial fibrillation) (HCC)  GERD without esophagitis  Abdominal pain  60 year old male with prostate CA mets to bone with worsening back pain now also with abdominal pain. Is on significant amount of narcotics at home for his pain. Oncology feels he is doing better and that his abdominal pain is a separate problem unrelated to his cancer. GI has decided to hold off on EGD. PSA noted stable, has restarted a diet. No obvious mets to liver on imaging. MRI of his spine found widespread metastatic disease throughout osseous structures with no pathologic fractures or neural  impingement. Pain regimen at home: MS IR alternating with oxycodone every 4 hours. Same regimen in the hospital with prn IV dilaudid, added lidocaine patch. He was able to ambulate with mobility yesterday. Adjustments were made to his pain regimen (see consult note) with improvement yesterday. However, overnight had a significant amount of breakthrough pain requiring multiple doses of Dilaudid. Have made adjustments to his pain regimen based on breakthrough dose requirements (see below). Increased adjunct therapy (dexamethasone) as well.  SUMMARY OF RECOMMENDATIONS   Increase  Fentanyl patch dose to 275 mcg/hr Increase oxycodone dose to 30 mg q 3 hour prn for breakthrough Continue current dilaudid dose for severe breakthrough Increase dexamethasone to 2 mg every 8 hours PMT will continue to follow  Code Status: Full code  Prognosis: Unable to determine  Discharge Planning: To Be Determined  Discussed with: Patient, nursing staff, medical team  Thank you for allowing [place] to participate in the care of UNO ESAU PMT will continue to support holistically.  Time Total: 65 min  Visit consisted of counseling and education dealing with the complex and emotionally intense issues of symptom management and palliative care in the setting of serious and potentially life-threatening illness. Greater than 50%  of this time was spent counseling and coordinating care related to the above assessment and plan.  Walden Field, NP Palliative Medicine Team  Team Phone # [phone] (Nights/Weekends)  09/25/2020, 8:17 AM

## 2020-12-08 NOTE — Plan of Care (Signed)

## 2020-12-08 NOTE — Progress Notes (Addendum)
Patient had a rough night, and needed frequent pain medication. Reached out to the on call provider due to patient needing more pain medication and it was too soon to give. On call provider gave a one time dose of Dilaudid 1 mg IV injection.   Patient also experienced difficulty urinating. Notified on call provider about patient having trouble urinating and patient had 423 mL of urine in bladder from bladder scanning. On call provider gave a one time order for in and out catheter; however, patient was finally able to urinate sufficiently without doing the in and out catheter.   On call provider was also notified of patient's BP of 176/99 and MAP 121. Patient was still in 10/10 pain. Gave PRN pain meds, and passed along to dayshift nurse to continue to monitor BP. On call provider did not give any new orders for the elevated BP.  Patient continues to refuse subcutaneous Lovenox injection.

## 2020-12-08 NOTE — Progress Notes (Signed)
Chaplain received a consult to provide support for patient. He stated that his back hurts "sometimes" but that otherwise he was fine.  He has good support from his wife, but since being admitted to the hospital, his wife's daughter has gotten power of attorney and took her mom to stay with her because he can't take care of her.  His wife has MS and he has been doing his best to care for her and for himself. He welcomed chaplain to come back any time.  [place], Bcc Pager, [phone] 1:58 PM

## 2020-12-09 DIAGNOSIS — R7989 Other specified abnormal findings of blood chemistry: Secondary | ICD-10-CM | POA: Diagnosis not present

## 2020-12-09 DIAGNOSIS — R109 Unspecified abdominal pain: Secondary | ICD-10-CM | POA: Diagnosis not present

## 2020-12-09 DIAGNOSIS — I480 Paroxysmal atrial fibrillation: Secondary | ICD-10-CM | POA: Diagnosis not present

## 2020-12-09 NOTE — Progress Notes (Signed)
PROGRESS NOTE  Ryan Davis ZOX:[id number] DOB: 04/02/1960 DOA: 12/04/2020 PCP: Robert Bellow, PA-C   LOS: 4 days   Brief Narrative / Interim history: 60 year old male with history of metastatic prostate cancer to multiple sites including bone, chronic pain secondary to metastatic disease, paroxysmal AF not on anticoagulation, comes into the hospital with abdominal and back pain.  He has been reporting worsening mid back pain over the last several days, and has been taking Aleve's around-the-clock.  While eating dinner the day prior to admission has noticed epigastric abdominal pain, sharp, and decided to come to the hospital.  He did have incidental intra and extrahepatic biliary dilatation for which he underwent an MRCP, which was suspect for cholangitis versus cholangiocarcinoma.  He was admitted to the hospital, GI and oncology consulted  Subjective / 24h Interval events: Pain is better but he is still afraid of becoming severe again  Assessment & Plan: Principal Problem Back pain, abdominal pain, cancer-related pain-patient noted to have osseous metastasis from his prostate cancer, possibly that his back pain got worse due to that.  We will obtain an MRI of the thoracic spine in the area where he complains of the most pain.  He reported some subjective weakness as well.  His abdominal pain is possibly related to his heavy NSAID use -Palliative care consulted, appreciate input.  He is currently on Oxley 30 mg every 3 hours along with 275 of fentanyl.  He has not used any IV Dilaudid overnight which is significant improvement.  If he no longer needs IV Dilaudid potentially could go home tomorrow  Active Problems Abnormal LFTs-mild, MRCP raise suspicion for cholangitis versus cholangiocarcinoma.  GI consulted, discussed with Dr. Therisa Doyne and cholangitis / cholangiocarcinoma is less likely at this point.  She does recommend treatment with 5 days of antibiotics (today is day #5) because of LFT  elevation and also uncertainty.  Discontinue antibiotics tomorrow.  LFTs could have been raised by heavy NSAID use.  LFTs improving -GI also recommends repeat abdominal imaging in 4 weeks to rule out a new neoplastic process such as cholangiocarcinoma which is not very obvious right now  Hypokalemia-replete and monitor, normalized this morning  Metastatic prostate cancer-appreciate Dr. Antonieta Pert input  Paroxysmal A. fib-low CHA2DS2-VASc's, on aspirin monotherapy  GERD/concern for PUD due to heavy NSAID use-continue PPI   Scheduled Meds:  aspirin  81 mg Oral Daily   Chlorhexidine Gluconate Cloth  6 each Topical Daily   dexamethasone  2 mg Oral Q8H   enoxaparin (LOVENOX) injection  40 mg Subcutaneous Q24H   fentaNYL  2 patch Transdermal Q48H   fentaNYL  1 patch Transdermal Q48H   gabapentin  800 mg Oral TID   lidocaine  2 patch Transdermal Q24H   metoprolol tartrate  12.5 mg Oral BID   pantoprazole  40 mg Oral BID   Plecanatide  3 mg Oral Daily   polyethylene glycol  17 g Oral BID   senna  2 tablet Oral BID   sucralfate  1 g Oral TID WC & HS   tamsulosin  0.4 mg Oral Daily   Continuous Infusions:  piperacillin-tazobactam (ZOSYN)  IV 3.375 g (12/09/20 0526)   PRN Meds:.acetaminophen **OR** acetaminophen, cyclobenzaprine, HYDROmorphone (DILAUDID) injection, melatonin, ondansetron **OR** ondansetron (ZOFRAN) IV, oxyCODONE **OR** [DISCONTINUED]  HYDROmorphone (DILAUDID) injection, sodium chloride flush  Diet Orders (From admission, onward)     Start     Ordered   12/05/20 1001  Diet regular Room service appropriate? No; Fluid consistency: Thin  Diet effective now       Question Answer Comment  Room service appropriate? No   Fluid consistency: Thin      12/05/20 1001            DVT prophylaxis: enoxaparin (LOVENOX) injection 40 mg Start: 12/04/20 2300     Code Status: Full Code  Family Communication: no family at bedside   Status is: Inpatient  Inpatient because of  uncontrolled pain, IV Dilaudid  Level of care: Telemetry  Consultants:  GI Oncology   Procedures:  none  Microbiology  none  Antimicrobials: Zosyn 11/8 >>    Objective: Vitals:   12/08/20 1247 12/08/20 2153 12/09/20 0603 12/09/20 0605  BP: (!) 158/105 103/67 91/64 91/64   Pulse: 88 77 72 69  Resp: 16 18 18 18   Temp: 98.6 F (37 C) 98.3 F (36.8 C) 98.5 F (36.9 C) 98.5 F (36.9 C)  TempSrc: Oral Oral Oral Oral  SpO2: 100% 100% 98% 97%  Weight:      Height:        Intake/Output Summary (Last 24 hours) at 12/09/2020 1123 Last data filed at 12/09/2020 0913 Gross per 24 hour  Intake 1081.66 ml  Output 1075 ml  Net 6.66 ml    Filed Weights   12/04/20 0635 12/07/20 0546  Weight: 72.6 kg 73.5 kg    Examination:  Constitutional: More comfortable, eating breakfast Eyes: No scleral icterus ENMT: mmm Neck: normal, supple Respiratory: Clear bilaterally without wheezing or crackles Cardiovascular: Regular rate and rhythm, no murmurs Abdomen: Soft, nontender, nondistended, bowel sounds positive Musculoskeletal: no clubbing / cyanosis.  Skin: No rashes seen Neurologic: No focal deficits  Data Reviewed: I have independently reviewed following labs and imaging studies  CBC: Recent Labs  Lab 12/04/20 0745 12/05/20 0445 12/08/20 0309  WBC 9.9 10.2 10.4  NEUTROABS 6.7 6.7 6.7  HGB 12.7* 11.2* 12.0*  HCT 38.1* 33.6* 37.0*  MCV 89.9 88.9 90.7  PLT 183 151 629    Basic Metabolic Panel: Recent Labs  Lab 12/04/20 0735 12/05/20 0445 12/08/20 0309  NA 137 138 134*  K 3.3* 4.0 3.9  CL 99 104 103  CO2 25 28 23   GLUCOSE 120* 98 120*  BUN 9 10 8   CREATININE 0.71 0.58* 0.62  CALCIUM 9.2 7.9* 8.9  MG 2.3 2.4  --     Liver Function Tests: Recent Labs  Lab 12/04/20 0735 12/05/20 0445 12/08/20 0309  AST 76* 31 45*  ALT 102* 66* 67*  ALKPHOS 720* 577* 682*  BILITOT 1.1 0.9 1.2  PROT 7.9 6.2* 6.8  ALBUMIN 4.8 3.8 4.3    Coagulation Profile: No  results for input(s): INR, PROTIME in the last 168 hours. HbA1C: No results for input(s): HGBA1C in the last 72 hours. CBG: No results for input(s): GLUCAP in the last 168 hours.  Recent Results (from the past 240 hour(s))  Resp Panel by RT-PCR (Flu A&B, Covid) Nasopharyngeal Swab     Status: None   Collection Time: 12/04/20  3:53 PM   Specimen: Nasopharyngeal Swab; Nasopharyngeal(NP) swabs in vial transport medium  Result Value Ref Range Status   SARS Coronavirus 2 by RT PCR NEGATIVE NEGATIVE Final    Comment: (NOTE) SARS-CoV-2 target nucleic acids are NOT DETECTED.  The SARS-CoV-2 RNA is generally detectable in upper respiratory specimens during the acute phase of infection. The lowest concentration of SARS-CoV-2 viral copies this assay can detect is 138 copies/mL. A negative result does not preclude SARS-Cov-2 infection and should not be  used as the sole basis for treatment or other patient management decisions. A negative result may occur with  improper specimen collection/handling, submission of specimen other than nasopharyngeal swab, presence of viral mutation(s) within the areas targeted by this assay, and inadequate number of viral copies(<138 copies/mL). A negative result must be combined with clinical observations, patient history, and epidemiological information. The expected result is Negative.  Fact Sheet for Patients:  EntrepreneurPulse.com.au  Fact Sheet for Healthcare Providers:  IncredibleEmployment.be  This test is no t yet approved or cleared by the [place] FDA and  has been authorized for detection and/or diagnosis of SARS-CoV-2 by FDA under an Emergency Use Authorization (EUA). This EUA will remain  in effect (meaning this test can be used) for the duration of the COVID-19 declaration under Section 564(b)(1) of the Act, 21 U.S.C.section 360bbb-3(b)(1), unless the authorization is terminated  or revoked sooner.        Influenza A by PCR NEGATIVE NEGATIVE Final   Influenza B by PCR NEGATIVE NEGATIVE Final    Comment: (NOTE) The Xpert Xpress SARS-CoV-2/FLU/RSV plus assay is intended as an aid in the diagnosis of influenza from Nasopharyngeal swab specimens and should not be used as a sole basis for treatment. Nasal washings and aspirates are unacceptable for Xpert Xpress SARS-CoV-2/FLU/RSV testing.  Fact Sheet for Patients: EntrepreneurPulse.com.au  Fact Sheet for Healthcare Providers: IncredibleEmployment.be  This test is not yet approved or cleared by the [place] FDA and has been authorized for detection and/or diagnosis of SARS-CoV-2 by FDA under an Emergency Use Authorization (EUA). This EUA will remain in effect (meaning this test can be used) for the duration of the COVID-19 declaration under Section 564(b)(1) of the Act, 21 U.S.C. section 360bbb-3(b)(1), unless the authorization is terminated or revoked.  Performed at [facility], [address] [place], [place] [address]       Radiology Studies: No results found.   Marzetta Board, MD, PhD [facility]  Between 7 am - 7 pm I am available, please contact me via Amion (for emergencies) or Securechat (non urgent messages)  Between 7 pm - 7 am I am not available, please contact night coverage MD/APP via Amion

## 2020-12-09 NOTE — Plan of Care (Signed)

## 2020-12-09 NOTE — Progress Notes (Signed)
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 Prostate cancer metastatic to bone [facility])  C61    C79.51       Interval Since Last Radiation:  4 years 09/16/16 - 10/06/16-- 35 Gy with 14 Fx to the Lumbar spine shown below   Narrative:  The patient was recently admitted with abdominal pain.  CT and MRI show Widespread metastatic disease throughout the visualized osseous structures. No associated pathologic fracture. No extra osseous or epidural extension. No neural impingement.  Dr. Marin Olp requested our input about possible radiation therapy.   The patient reports that his pain has actually dramatically improved over the past 48 hours as a result of increase Fentanyl patches to 275 mcg/hr and adding 1-2 mg Dilaudid prn.  Currently, he denies pain.  ALLERGIES:  is allergic to contrast media [iodinated diagnostic agents] and other.  Meds: No current facility-administered medications for this encounter.   No current outpatient medications on file.   Facility-Administered Medications Ordered in Other Encounters  Medication Dose Route Frequency Provider Last Rate Last Admin   acetaminophen (TYLENOL) tablet 650 mg  650 mg Oral Q6H PRN Vernelle Emerald, MD   650 mg at 12/08/20 0041   Or   acetaminophen (TYLENOL) suppository 650 mg  650 mg Rectal Q6H PRN Shalhoub, Sherryll Burger, MD       aspirin chewable tablet 81 mg  81 mg Oral Daily Shalhoub, Sherryll Burger, MD   81 mg at 12/09/20 6759   Chlorhexidine Gluconate Cloth 2 % PADS 6 each  6 each Topical Daily Shalhoub, Sherryll Burger, MD   6 each at 12/09/20 0908   cyclobenzaprine (FLEXERIL) tablet 10 mg  10 mg Oral TID PRN Vernelle Emerald, MD   10 mg  at 12/08/20 0041   dexamethasone (DECADRON) tablet 2 mg  2 mg Oral Q8H Gill, Eric A, NP   2 mg at 12/09/20 1222   enoxaparin (LOVENOX) injection 40 mg  40 mg Subcutaneous Q24H Shalhoub, Sherryll Burger, MD       fentaNYL (DURAGESIC) 100 MCG/HR 2 patch  2 patch Transdermal Q48H Caren Griffins, MD       fentaNYL (DURAGESIC) 75 MCG/HR 1 patch  1 patch Transdermal Q48H Green, Terri L, RPH   1 patch at 12/08/20 1300   gabapentin (NEURONTIN) capsule 800 mg  800 mg Oral TID Vernelle Emerald, MD   800 mg at 12/09/20 0908   HYDROmorphone (DILAUDID) injection 1-2 mg  1-2 mg Intravenous Q3H PRN Walden Field A, NP   2 mg at 12/08/20 1009   lidocaine (LIDODERM) 5 % 2 patch  2 patch Transdermal Q24H Caren Griffins, MD   2 patch at 12/07/20 0849   melatonin tablet 10 mg  10 mg Oral QHS PRN Vernelle Emerald, MD   10 mg at 12/08/20 0041   metoprolol tartrate (LOPRESSOR) tablet 12.5 mg  12.5 mg Oral BID Vernelle Emerald, MD   12.5 mg at 12/09/20 0908   ondansetron (ZOFRAN) tablet 4 mg  4 mg Oral Q6H PRN Vernelle Emerald, MD       Or   ondansetron [facility]) injection 4 mg  4  mg Intravenous Q6H PRN Shalhoub, Sherryll Burger, MD       oxyCODONE (Oxy IR/ROXICODONE) immediate release tablet 30 mg  30 mg Oral Q3H PRN Carlis Stable, NP       pantoprazole (PROTONIX) EC tablet 40 mg  40 mg Oral BID [name], MD   40 mg at 12/09/20 0909   piperacillin-tazobactam (ZOSYN) IVPB 3.375 g  3.375 g Intravenous Q8H Poindexter, Leann T, RPH 12.5 mL/hr at 12/09/20 1225 3.375 g at 12/09/20 1225   Plecanatide TABS 3 mg  3 mg Oral Daily Shalhoub, Sherryll Burger, MD       polyethylene glycol (MIRALAX / GLYCOLAX) packet 17 g  17 g Oral BID [name], MD   17 g at 12/09/20 0908   senna (SENOKOT) tablet 17.2 mg  2 tablet Oral BID Vernelle Emerald, MD   17.2 mg at 12/09/20 0908   sodium chloride flush (NS) 0.9 % injection 10-40 mL  10-40 mL Intracatheter PRN Shalhoub, Sherryll Burger, MD       SONAFINE emulsion 1 application  1 application  Topical BID Gery Pray, MD       sucralfate (CARAFATE) tablet 1 g  1 g Oral TID WC & HS Vicie Mutters R, PA-C   1 g at 12/09/20 1222   tamsulosin (FLOMAX) capsule 0.4 mg  0.4 mg Oral Daily Shalhoub, Sherryll Burger, MD   0.4 mg at 12/09/20 8250    Physical Findings: The patient is in no acute distress. Patient is alert and oriented.  No significant changes.  Lab Findings: Lab Results  Component Value Date   WBC 10.4 12/08/2020   HGB 12.0 (L) 12/08/2020   HGB 12.4 (L) 11/12/2020   HGB 12.2 (L) 12/16/2016   HCT 37.0 (L) 12/08/2020   HCT 36.4 (L) 12/16/2016   PLT 156 12/08/2020   PLT 217 11/12/2020   PLT 185 12/16/2016    Lab Results  Component Value Date   NA 134 (L) 12/08/2020   NA 143 12/16/2016   K 3.9 12/08/2020   K 3.5 12/16/2016   CO2 23 12/08/2020   CO2 33 12/16/2016   GLUCOSE 120 (H) 12/08/2020   GLUCOSE 109 12/16/2016   BUN 8 12/08/2020   BUN 4 (L) 12/16/2016   CREATININE 0.62 12/08/2020   CREATININE 0.62 11/12/2020   CREATININE 0.8 12/16/2016   BILITOT 1.2 12/08/2020   BILITOT 0.6 11/12/2020   ALKPHOS 682 (H) 12/08/2020   ALKPHOS 77 12/16/2016   AST 45 (H) 12/08/2020   AST 29 11/12/2020   ALT 67 (H) 12/08/2020   ALT 42 11/12/2020   ALT 16 12/16/2016   PROT 6.8 12/08/2020   PROT 6.7 12/16/2016   ALBUMIN 4.3 12/08/2020   ALBUMIN 3.7 12/16/2016   CALCIUM 8.9 12/08/2020   CALCIUM 9.4 12/16/2016   ANIONGAP 8 12/08/2020    Radiographic Findings: CT ABDOMEN PELVIS WO CONTRAST  Result Date: 11/15/2020 CLINICAL DATA:  Acute abdominal pain EXAM: CT ABDOMEN AND PELVIS WITHOUT CONTRAST TECHNIQUE: Multidetector CT imaging of the abdomen and pelvis was performed following the standard protocol without IV contrast. COMPARISON:  10/18/2020 FINDINGS: Lower chest: Mild scarring is noted in the bases bilaterally. Hepatobiliary: No focal liver abnormality is seen. No gallstones, gallbladder wall thickening, or biliary dilatation. Pancreas: There is a persistent but  stable 3.6 cm cystic lesion in the tail of the pancreas. This has been stable dating back to 2018. No other pancreatic abnormality is noted. Spleen: Scattered calcified granulomas are seen. Adrenals/Urinary Tract:  Adrenal glands are within normal limits. The kidneys demonstrate no renal calculi or obstructive changes. The bladder is partially distended. Stomach/Bowel: Fecal material is noted throughout the colon. No obstructive changes are noted. The appendix is not well visualized although no inflammatory changes are seen to suggest appendicitis. Small bowel and stomach are within normal limits. Vascular/Lymphatic: No significant vascular findings are present. No enlarged abdominal or pelvic lymph nodes. Reproductive: Prostate is unremarkable. Other: No abdominal wall hernia or abnormality. No abdominopelvic ascites. Musculoskeletal: Diffuse sclerotic foci are noted consistent with metastatic disease from the known prostate carcinoma. Overall appearance is similar to previous exam. IMPRESSION: Stable pancreatic tail cystic lesion unchanged from 2018. Findings of prior granulomatous disease. Bony metastatic disease from the patient's known prostate carcinoma. No acute abnormality is noted. Electronically Signed   By: Inez Catalina M.D.   On: 11/15/2020 00:29   CT Angio Chest Pulmonary Embolism (PE) W or WO Contrast  Result Date: 12/04/2020 CLINICAL DATA:  Chest pain short of breath EXAM: CT ANGIOGRAPHY CHEST WITH CONTRAST TECHNIQUE: Multidetector CT imaging of the chest was performed using the standard protocol during bolus administration of intravenous contrast. Multiplanar CT image reconstructions and MIPs were obtained to evaluate the vascular anatomy. CONTRAST:  163mL OMNIPAQUE IOHEXOL 350 MG/ML SOLN COMPARISON:  CT 10/18/2020 FINDINGS: Cardiovascular: Satisfactory opacification of the pulmonary arteries to the segmental level. No evidence of pulmonary embolism. Normal heart size. No pericardial effusion.  Nonaneurysmal aorta. No dissection is seen. Mediastinum/Nodes: No enlarged mediastinal, hilar, or axillary lymph nodes. Thyroid gland, trachea, and esophagus demonstrate no significant findings. Lungs/Pleura: Emphysema. No acute airspace disease, pleural effusion, or pneumothorax Upper Abdomen: No acute abnormality. Musculoskeletal: Diffuse skeletal metastatic disease as before Review of the MIP images confirms the above findings. IMPRESSION: 1. Negative for acute pulmonary embolus or aortic dissection 2. Emphysema without acute airspace disease 3. Diffuse skeletal metastatic disease Aortic Atherosclerosis (ICD10-I70.0) and Emphysema (ICD10-J43.9). Electronically Signed   By: Donavan Foil M.D.   On: 12/04/2020 18:45   MR THORACIC SPINE W WO CONTRAST  Result Date: 12/06/2020 CLINICAL DATA:  Initial evaluation for metastatic prostate cancer, surveillance. EXAM: MRI THORACIC WITHOUT AND WITH CONTRAST TECHNIQUE: Multiplanar and multiecho pulse sequences of the thoracic spine were obtained without and with intravenous contrast. CONTRAST:  64mL GADAVIST GADOBUTROL 1 MMOL/ML IV SOLN COMPARISON:  Prior CT from 12/04/2020. FINDINGS: Alignment: Physiologic with preservation of the normal thoracic kyphosis. No listhesis. Vertebrae: Widespread metastatic disease seen throughout the visualized osseous structures. There is involvement of essentially all levels, with diffusely abnormal appearance of the visualized bone marrow. Vertebral body height maintained without associated pathologic fracture. No extra osseous or epidural extension of tumor. Cord: Normal signal and morphology. No epidural or intracanalicular tumor. Paraspinal and other soft tissues: Paraspinous soft tissues demonstrate no acute finding. 3.3 cm lipoma noted within the para median musculature of the left upper back. Trace layering bilateral pleural effusions noted. Approximate 3.7 cm cystic lesion at the pancreatic tail noted, stable from prior, and better  evaluated on prior CTs. Disc levels: T2-3: Disc bulge with small right paracentral to foraminal disc protrusion (series 24, image 5). No significant spinal stenosis. Foramina remain patent. T5-6: Left paracentral disc protrusion indents the left ventral thecal sac (series 24, image 6). No significant canal or foraminal stenosis. Otherwise, no other significant disc pathology seen within the thoracic spine. No other stenosis or evidence for neural impingement. IMPRESSION: 1. Widespread metastatic disease throughout the visualized osseous structures. No associated pathologic fracture. No extra osseous  or epidural extension. No neural impingement. 2. Small disc protrusions at T2-3 and T5-6 without significant stenosis. Electronically Signed   By: Jeannine Boga M.D.   On: 12/06/2020 02:32   MR 3D Recon At Scanner  Result Date: 12/05/2020 CLINICAL DATA:  Right upper quadrant pain, jaundice EXAM: MRI ABDOMEN WITHOUT AND WITH CONTRAST (INCLUDING MRCP) TECHNIQUE: Multiplanar multisequence MR imaging of the abdomen was performed both before and after the administration of intravenous contrast. Heavily T2-weighted images of the biliary and pancreatic ducts were obtained, and three-dimensional MRCP images were rendered by post processing. CONTRAST:  68mL GADAVIST GADOBUTROL 1 MMOL/ML IV SOLN COMPARISON:  CTA abdomen/pelvis dated 12/04/2020. Right upper quadrant ultrasound dated 12/04/2020. CT abdomen/pelvis dated 12/04/2020. FINDINGS: Motion degraded images. Lower chest: Lung bases are clear. Hepatobiliary: Liver is within normal limits. No suspicious/enhancing hepatic lesions. Gallbladder is unremarkable. No gallstones or associated inflammatory changes. Mild intrahepatic ductal dilatation. Common duct measures 8 mm (series 3/image 13). No choledocholithiasis is seen. On delayed imaging, there is mild peribiliary enhancement centrally in the liver (series 35/image 27). This appearance is nonspecific and may reflect  cholangitis, although bile duct tumor/cholangiocarcinoma is not excluded. Pancreas: 3.8 cm nonenhancing cyst in the pancreatic tail (series 5/image 11). Spleen:  Within normal limits. Adrenals/Urinary Tract:  Adrenal glands are within normal limits. Kidneys are within normal limits.  No hydronephrosis. Stomach/Bowel: Stomach is within normal limits. Visualized bowel is grossly unremarkable. Vascular/Lymphatic:  No evidence of abdominal aortic aneurysm. No suspicious abdominal lymphadenopathy. Other:  No abdominal ascites. Musculoskeletal: Multifocal osseous metastases. IMPRESSION: Motion degraded images. Mild intrahepatic ductal dilatation with delayed peribiliary enhancement, nonspecific. This may reflect cholangitis, although bile duct tumor/cholangiocarcinoma is not excluded. Common duct measures 8 mm.  No choledocholithiasis is seen. Additional ancillary findings as above. Electronically Signed   By: Julian Hy M.D.   On: 12/05/2020 00:37   MR ABDOMEN MRCP W WO CONTAST  Result Date: 12/05/2020 CLINICAL DATA:  Right upper quadrant pain, jaundice EXAM: MRI ABDOMEN WITHOUT AND WITH CONTRAST (INCLUDING MRCP) TECHNIQUE: Multiplanar multisequence MR imaging of the abdomen was performed both before and after the administration of intravenous contrast. Heavily T2-weighted images of the biliary and pancreatic ducts were obtained, and three-dimensional MRCP images were rendered by post processing. CONTRAST:  70mL GADAVIST GADOBUTROL 1 MMOL/ML IV SOLN COMPARISON:  CTA abdomen/pelvis dated 12/04/2020. Right upper quadrant ultrasound dated 12/04/2020. CT abdomen/pelvis dated 12/04/2020. FINDINGS: Motion degraded images. Lower chest: Lung bases are clear. Hepatobiliary: Liver is within normal limits. No suspicious/enhancing hepatic lesions. Gallbladder is unremarkable. No gallstones or associated inflammatory changes. Mild intrahepatic ductal dilatation. Common duct measures 8 mm (series 3/image 13). No  choledocholithiasis is seen. On delayed imaging, there is mild peribiliary enhancement centrally in the liver (series 35/image 27). This appearance is nonspecific and may reflect cholangitis, although bile duct tumor/cholangiocarcinoma is not excluded. Pancreas: 3.8 cm nonenhancing cyst in the pancreatic tail (series 5/image 11). Spleen:  Within normal limits. Adrenals/Urinary Tract:  Adrenal glands are within normal limits. Kidneys are within normal limits.  No hydronephrosis. Stomach/Bowel: Stomach is within normal limits. Visualized bowel is grossly unremarkable. Vascular/Lymphatic:  No evidence of abdominal aortic aneurysm. No suspicious abdominal lymphadenopathy. Other:  No abdominal ascites. Musculoskeletal: Multifocal osseous metastases. IMPRESSION: Motion degraded images. Mild intrahepatic ductal dilatation with delayed peribiliary enhancement, nonspecific. This may reflect cholangitis, although bile duct tumor/cholangiocarcinoma is not excluded. Common duct measures 8 mm.  No choledocholithiasis is seen. Additional ancillary findings as above. Electronically Signed   By: Julian Hy  M.D.   On: 12/05/2020 00:37   CT RENAL STONE STUDY  Result Date: 12/04/2020 CLINICAL DATA:  Flank pain since last evening. EXAM: CT ABDOMEN AND PELVIS WITHOUT CONTRAST TECHNIQUE: Multidetector CT imaging of the abdomen and pelvis was performed following the standard protocol without IV contrast. COMPARISON:  CT scan 11/15/2020 FINDINGS: Lower chest: The lung bases are clear of acute process. No pleural effusion or pulmonary lesions. The heart is normal in size. No pericardial effusion. The distal esophagus and aorta are unremarkable. Hepatobiliary: No hepatic lesions or intrahepatic biliary dilatation. The gallbladder is unremarkable. No common bile duct dilatation. Pancreas: Stable 2.7 cm cystic lesion in the pancreatic tail. No acute inflammation or ductal dilatation. Spleen: Normal size. No focal lesions.  Adrenals/Urinary Tract: Adrenal glands and kidneys are unremarkable. No renal, ureteral bladder calculi obvious mass without contrast. Stomach/Bowel: The stomach, duodenum, small bowel and colon are grossly normal. Vascular/Lymphatic: The aorta is normal in caliber. No atheroscerlotic calcifications. No mesenteric of retroperitoneal mass or adenopathy. Small scattered lymph nodes are noted. Reproductive: The prostate gland and seminal vesicles unremarkable. Other: No pelvic mass or adenopathy. No free pelvic fluid collections. No inguinal mass or adenopathy. No abdominal wall hernia or subcutaneous lesions. Musculoskeletal: Stable diffuse sclerotic metastatic bone disease related to patient's known prostate cancer. No acute bony findings are identified. IMPRESSION: 1. No acute abdominal/pelvic findings, mass lesions or adenopathy. 2. No renal, ureteral or bladder calculi or obvious mass without contrast. 3. Stable diffuse sclerotic metastatic bone disease related to patient's known prostate cancer. 4. Stable 2.7 cm cystic lesion in the pancreatic tail. Electronically Signed   By: Marijo Sanes M.D.   On: 12/04/2020 07:45   CT Angio Abd/Pel W and/or Wo Contrast  Result Date: 12/04/2020 CLINICAL DATA:  Chest pain short of breath nausea vomiting EXAM: CTA ABDOMEN AND PELVIS WITHOUT AND WITH CONTRAST TECHNIQUE: Multidetector CT imaging of the abdomen and pelvis was performed using the standard protocol during bolus administration of intravenous contrast. Multiplanar reconstructed images and MIPs were obtained and reviewed to evaluate the vascular anatomy. CONTRAST:  113mL OMNIPAQUE IOHEXOL 350 MG/ML SOLN COMPARISON:  CT 12/04/2020, 11/15/2020, 10/18/2020, PET CT 03/03/2017, CT 09/26/2020 FINDINGS: VASCULAR Aorta: Normal caliber aorta without aneurysm, dissection, vasculitis or significant stenosis. Celiac: Patent without evidence of aneurysm, dissection, vasculitis or significant stenosis. SMA: Patent without  evidence of aneurysm, dissection, vasculitis or significant stenosis. Renals: Single right renal artery with mild stenosis about a cm distal to the origin. Two left renal arteries, more cranial accessory artery is patent and without occlusion or stenosis. Dominant main renal artery demonstrates moderate severe focal stenosis of the proximal renal artery about 8 mm distal to the origin, coronal series 18 image 60. IMA: Patent without evidence of aneurysm, dissection, vasculitis or significant stenosis. Inflow: Patent without evidence of aneurysm, dissection, vasculitis or significant stenosis. Proximal Outflow: Bilateral common femoral and visualized portions of the superficial and profunda femoral arteries are patent without evidence of aneurysm, dissection, vasculitis or significant stenosis. Review of the MIP images confirms the above findings. NON-VASCULAR Lower chest: Emphysema.  No acute 1 airspace disease or effusion. Hepatobiliary: No calcified gallstone. No focal hepatic abnormality. Mild intra hepatic biliary dilatation. Common duct diameter up to 8 mm. Pancreas: No inflammation. 2.9 cm cyst in the pancreatic tail no change. Spleen: Normal in size without focal abnormality. Adrenals/Urinary Tract: Adrenal glands are unremarkable. Kidneys are normal, without renal calculi, focal lesion, or hydronephrosis. Bladder is unremarkable. Stomach/Bowel: Stomach is within normal limits. Appendix not  well seen but no right lower quadrant inflammatory process. No evidence of bowel wall thickening, distention, or inflammatory changes. Negative for intramural air. Liquid stools in the colon. Lymphatic: No suspicious lymph nodes Reproductive: Negative prostate Other: Negative for free air. Trace fluid in the right upper quadrant adjacent to the inferior margin of the right hepatic lobe Musculoskeletal: Diffuse skeletal sclerotic metastatic disease as noted on previous exams. IMPRESSION: VASCULAR 1. Negative for aortic  dissection or acute occlusive disease. 2. Mild right stenosis of proximal right renal artery. Moderate to marked focal stenosis of dominant proximal left renal artery. NON-VASCULAR 1. No CT evidence for acute intra-abdominal or pelvic abnormality. 2. Mild intra and extrahepatic biliary dilatation which may be correlated with LFTs with follow-up MR or ERCP is indicated 3. Stable sclerotic metastatic disease 4. Stable cystic lesion in the pancreatic tail Electronically Signed   By: Donavan Foil M.D.   On: 12/04/2020 18:39   US Abdomen Limited RUQ (LIVER/GB)  Result Date: 12/04/2020 CLINICAL DATA:  Right upper quadrant pain. EXAM: ULTRASOUND ABDOMEN LIMITED RIGHT UPPER QUADRANT COMPARISON:  CT abdomen pelvis from same day. FINDINGS: Gallbladder: No gallstones or wall thickening visualized. No sonographic Murphy sign noted by sonographer. Common bile duct: Diameter: 5 mm, normal. Liver: Mild central intrahepatic biliary dilatation. No focal lesion identified. Within normal limits in parenchymal echogenicity. Portal vein is patent on color Doppler imaging with normal direction of blood flow towards the liver. Other: None. IMPRESSION: 1. Mild central intrahepatic biliary dilatation without common bile duct dilatation. Consider further evaluation with ERCP or MRCP. Electronically Signed   By: Titus Dubin M.D.   On: 12/04/2020 11:11    Impression:  The was admitted with severe pain, but, this seems to have improved with an increase in narcotic analgesia.  I would not recommend additional radiation at this time.  Plan:  We will follow and potentially offer palliative radiation if clinically indicated.  In the meantime, the patient is likely to proceed with Pluvicto therapy under Dr. Antonieta Pert direction.    I personally spent 60 minutes in this encounter including chart review, reviewing radiological studies, meeting face-to-face with the patient, entering orders and completing  documentation.   _____________________________________  Sheral Apley Tammi Klippel, M.D.

## 2020-12-10 DIAGNOSIS — I480 Paroxysmal atrial fibrillation: Secondary | ICD-10-CM | POA: Diagnosis not present

## 2020-12-10 DIAGNOSIS — R1084 Generalized abdominal pain: Secondary | ICD-10-CM | POA: Diagnosis not present

## 2020-12-10 DIAGNOSIS — R109 Unspecified abdominal pain: Secondary | ICD-10-CM | POA: Diagnosis not present

## 2020-12-10 MED ORDER — FENTANYL 75 MCG/HR TD PT72: 1.0000 | MEDICATED_PATCH | TRANSDERMAL | 0 refills | Status: DC

## 2020-12-10 MED ORDER — HEPARIN SOD (PORK) LOCK FLUSH 100 UNIT/ML IV SOLN
500.0000 [IU] | INTRAVENOUS | Status: AC | PRN
  Administered 2020-12-10: 500 [IU]
  Filled 2020-12-10: qty 5

## 2020-12-10 MED ORDER — OXYCODONE HCL 30 MG PO TABS: 30.0000 mg | ORAL_TABLET | ORAL | 0 refills | Status: DC | PRN

## 2020-12-10 MED ORDER — FENTANYL 75 MCG/HR TD PT72: 3.0000 | MEDICATED_PATCH | TRANSDERMAL | 0 refills | Status: DC

## 2020-12-10 MED ORDER — DEXAMETHASONE 2 MG PO TABS: ORAL_TABLET | ORAL | 0 refills | Status: DC

## 2020-12-10 MED ORDER — FENTANYL 100 MCG/HR TD PT72: 2.0000 | MEDICATED_PATCH | TRANSDERMAL | 0 refills | Status: DC

## 2020-12-10 NOTE — Discharge Summary (Signed)
Physician Discharge Summary  Ryan Davis FGH:[id number] DOB: 06-06-1960 DOA: 12/04/2020  PCP: Robert Bellow, PA-C  Admit date: 12/04/2020 Discharge date: 12/10/2020  Admitted From: home Disposition:  home  Recommendations for Outpatient Follow-up:  Follow up with palliative within a week Follow up with Oncology   Home Health: none Equipment/Devices: none  Discharge Condition: stable CODE STATUS: Full code Diet recommendation: regular   HPI: Per admitting MD, 60 year old male with past medical history of metastatic prostate cancer to multiple sites including bone, chronic pain syndrome secondary to diffuse metastatic disease, paroxysmal atrial fibrillation (not on full dose anticoagulation), gastroesophageal reflux disease who presented to [place] emergency department with complaints of abdominal pain and back pain. Patient explains that he suddenly developed epigastric pain 30 minutes after eating dinner evening of 11/7.  Patient describes his abdominal pain as radiating to his right flank.  Pain is severe in intensity, sharp in quality and worse with movement.  Patient states that his home regimen of analgesics are not improving his pain.  Patient does also complain of associated nausea with occasional bouts of vomiting and poor oral intake.  Patient denies fevers, recent travel, sick contacts, recent ingestion of undercooked food or recent contacts with known COVID-19 infection. Patient's symptoms continued to worsen until he eventually presented to [place] emergency department for evaluation. Upon evaluation in the emergency department patient underwent extensive work-up including CT angiogram of the chest abdomen and pelvis.  This revealed no evidence of dissection or acute vaso-occlusive disease.  He did redemonstrate stable diffuse sclerotic metastatic disease.  I did also additionally identify intra and extrahepatic biliary dilatation for which radiology  recommended MRCP for further evaluation.  Patient was additionally identified to have a rising AST and ALT with elevated alkaline phosphatase.  Right upper quadrant ultrasound was additionally performed which redemonstrated central intrahepatic biliary dilatation.  The hospitalist group was then called and patient was accepted for transfer to [facility] long hospital for continued medical care.  Hospital Course / Discharge diagnoses: Principal Problem Back pain, abdominal pain, cancer-related pain-patient noted to have osseous metastasis from his prostate cancer, possibly that his back pain got worse due to that.  He underwent an MRI of the thoracic spine which showed widespread metastatic disease without pathologic fracture or epidural extension.  There was also no neural impingement.  Due to severe back pain palliative care was consulted, and eventually his pain improved on 275 MCG of fentanyl patch as well as oxycodone 30 mg every 3 hours.  He will be discharged home in stable condition, with current regimen, and palliative will follow-up as an outpatient within a week.   Active Problems Abnormal LFTs-mild, MRCP raise suspicion for cholangitis versus cholangiocarcinoma.  GI consulted, discussed with Dr. Therisa Doyne and cholangitis / cholangiocarcinoma is less likely at this point.  She does recommend treatment with 5 days of antibiotics (finished treatment while hospitalized) because of LFT elevation and also uncertainty.  Discontinue antibiotics tomorrow.  LFTs could have been raised by heavy NSAID use.  LFTs improving. GI also recommends repeat abdominal imaging in 4 weeks to rule out a new neoplastic process such as cholangiocarcinoma which is not very obvious right now Hypokalemia-repleted, normalized Metastatic prostate cancer-appreciate Dr. Antonieta Pert input Paroxysmal A. fib-low CHA2DS2-VASc's, on aspirin monotherapy GERD/concern for PUD due to heavy NSAID use-continue PPI  Sepsis ruled out   Discharge  Instructions   Allergies as of 12/10/2020       Reactions   Contrast  Media [iodinated Diagnostic Agents] Nausea And Vomiting   Patient vomits immediately after IV contrast is injected just prior to start of scan, was unable to scan patient in correct time frame.  Patient did not warn me of this when dosing the patient with oral CM and asking the patient history questions until the patient was on the table for the CT scan just prior to injection.  Ct Tech:  Karl Bales 07/29/17    Other Nausea And Vomiting   Patient vomits immediately after IV contrast is injected just prior to start of scan, was unable to scan patient in correct time frame.  Patient did not warn me of this when dosing the patient with oral CM and asking the patient history questions until the patient was on the table for the CT scan just prior to injection.  Ct Tech:  Karl Bales 07/29/17         Medication List     STOP taking these medications    aspirin 81 MG chewable tablet   morphine 30 MG tablet Commonly known as: MSIR       TAKE these medications    cyclobenzaprine 10 MG tablet Commonly known as: FLEXERIL Take 1 tablet (10 mg total) by mouth 3 (three) times daily.   dexamethasone 2 MG tablet Commonly known as: DECADRON Take 1 tablet (2 mg total) by mouth every 8 (eight) hours for 1 day, THEN 1 tablet (2 mg total) every 12 (twelve) hours for 1 day, THEN 1 tablet (2 mg total) daily for 3 days. Start taking on: December 10, 2020   famotidine 40 MG tablet Commonly known as: PEPCID Take 1 tablet (40 mg total) by mouth 2 (two) times daily.   fentaNYL 100 MCG/HR Commonly known as: Luna 2 patches onto the skin every other day. What changed: Another medication with the same name was added. Make sure you understand how and when to take each.   fentaNYL 75 MCG/HR Commonly known as: [place] 1 patch onto the skin every other day. What changed: You were already taking a medication with  the same name, and this prescription was added. Make sure you understand how and when to take each.   gabapentin 800 MG tablet Commonly known as: NEURONTIN Take 800 mg by mouth 3 (three) times daily. May take an additional 400mg  as needed for pain   metoprolol tartrate 25 MG tablet Commonly known as: LOPRESSOR TAKE 1/2 TABLET(12.5 MG) BY MOUTH TWICE DAILY What changed: See the new instructions.   Naloxone HCl 0.4 MG/0.4ML Soaj Use in the event of accidental overdose of opioids.   oxycodone 30 MG immediate release tablet Commonly known as: ROXICODONE Take 1 tablet (30 mg total) by mouth every 3 (three) hours as needed for up to 7 days for moderate pain. What changed:  medication strength how much to take when to take this reasons to take this   pantoprazole 40 MG tablet Commonly known as: Protonix Take 1 tablet (40 mg total) by mouth daily.   polyethylene glycol 17 g packet Commonly known as: MIRALAX / GLYCOLAX Take 17 g by mouth 2 (two) times daily.   senna 8.6 MG Tabs tablet Commonly known as: SENOKOT Take 2 tablets (17.2 mg total) by mouth 2 (two) times daily.   tamsulosin 0.4 MG Caps capsule Commonly known as: FLOMAX Take 1 capsule (0.4 mg total) by mouth daily.   Trulance 3 MG Tabs Generic drug: Plecanatide Take by mouth.  Consultations: Oncology  Palliative   Procedures/Studies:  CT ABDOMEN PELVIS WO CONTRAST  Result Date: 11/15/2020 CLINICAL DATA:  Acute abdominal pain EXAM: CT ABDOMEN AND PELVIS WITHOUT CONTRAST TECHNIQUE: Multidetector CT imaging of the abdomen and pelvis was performed following the standard protocol without IV contrast. COMPARISON:  10/18/2020 FINDINGS: Lower chest: Mild scarring is noted in the bases bilaterally. Hepatobiliary: No focal liver abnormality is seen. No gallstones, gallbladder wall thickening, or biliary dilatation. Pancreas: There is a persistent but stable 3.6 cm cystic lesion in the tail of the pancreas. This  has been stable dating back to 2018. No other pancreatic abnormality is noted. Spleen: Scattered calcified granulomas are seen. Adrenals/Urinary Tract: Adrenal glands are within normal limits. The kidneys demonstrate no renal calculi or obstructive changes. The bladder is partially distended. Stomach/Bowel: Fecal material is noted throughout the colon. No obstructive changes are noted. The appendix is not well visualized although no inflammatory changes are seen to suggest appendicitis. Small bowel and stomach are within normal limits. Vascular/Lymphatic: No significant vascular findings are present. No enlarged abdominal or pelvic lymph nodes. Reproductive: Prostate is unremarkable. Other: No abdominal wall hernia or abnormality. No abdominopelvic ascites. Musculoskeletal: Diffuse sclerotic foci are noted consistent with metastatic disease from the known prostate carcinoma. Overall appearance is similar to previous exam. IMPRESSION: Stable pancreatic tail cystic lesion unchanged from 2018. Findings of prior granulomatous disease. Bony metastatic disease from the patient's known prostate carcinoma. No acute abnormality is noted. Electronically Signed   By: Inez Catalina M.D.   On: 11/15/2020 00:29   CT Angio Chest Pulmonary Embolism (PE) W or WO Contrast  Result Date: 12/04/2020 CLINICAL DATA:  Chest pain short of breath EXAM: CT ANGIOGRAPHY CHEST WITH CONTRAST TECHNIQUE: Multidetector CT imaging of the chest was performed using the standard protocol during bolus administration of intravenous contrast. Multiplanar CT image reconstructions and MIPs were obtained to evaluate the vascular anatomy. CONTRAST:  117mL OMNIPAQUE IOHEXOL 350 MG/ML SOLN COMPARISON:  CT 10/18/2020 FINDINGS: Cardiovascular: Satisfactory opacification of the pulmonary arteries to the segmental level. No evidence of pulmonary embolism. Normal heart size. No pericardial effusion. Nonaneurysmal aorta. No dissection is seen. Mediastinum/Nodes: No  enlarged mediastinal, hilar, or axillary lymph nodes. Thyroid gland, trachea, and esophagus demonstrate no significant findings. Lungs/Pleura: Emphysema. No acute airspace disease, pleural effusion, or pneumothorax Upper Abdomen: No acute abnormality. Musculoskeletal: Diffuse skeletal metastatic disease as before Review of the MIP images confirms the above findings. IMPRESSION: 1. Negative for acute pulmonary embolus or aortic dissection 2. Emphysema without acute airspace disease 3. Diffuse skeletal metastatic disease Aortic Atherosclerosis (ICD10-I70.0) and Emphysema (ICD10-J43.9). Electronically Signed   By: Donavan Foil M.D.   On: 12/04/2020 18:45   MR THORACIC SPINE W WO CONTRAST  Result Date: 12/06/2020 CLINICAL DATA:  Initial evaluation for metastatic prostate cancer, surveillance. EXAM: MRI THORACIC WITHOUT AND WITH CONTRAST TECHNIQUE: Multiplanar and multiecho pulse sequences of the thoracic spine were obtained without and with intravenous contrast. CONTRAST:  10mL GADAVIST GADOBUTROL 1 MMOL/ML IV SOLN COMPARISON:  Prior CT from 12/04/2020. FINDINGS: Alignment: Physiologic with preservation of the normal thoracic kyphosis. No listhesis. Vertebrae: Widespread metastatic disease seen throughout the visualized osseous structures. There is involvement of essentially all levels, with diffusely abnormal appearance of the visualized bone marrow. Vertebral body height maintained without associated pathologic fracture. No extra osseous or epidural extension of tumor. Cord: Normal signal and morphology. No epidural or intracanalicular tumor. Paraspinal and other soft tissues: Paraspinous soft tissues demonstrate no acute finding. 3.3 cm lipoma noted  within the para median musculature of the left upper back. Trace layering bilateral pleural effusions noted. Approximate 3.7 cm cystic lesion at the pancreatic tail noted, stable from prior, and better evaluated on prior CTs. Disc levels: T2-3: Disc bulge with small  right paracentral to foraminal disc protrusion (series 24, image 5). No significant spinal stenosis. Foramina remain patent. T5-6: Left paracentral disc protrusion indents the left ventral thecal sac (series 24, image 6). No significant canal or foraminal stenosis. Otherwise, no other significant disc pathology seen within the thoracic spine. No other stenosis or evidence for neural impingement. IMPRESSION: 1. Widespread metastatic disease throughout the visualized osseous structures. No associated pathologic fracture. No extra osseous or epidural extension. No neural impingement. 2. Small disc protrusions at T2-3 and T5-6 without significant stenosis. Electronically Signed   By: Jeannine Boga M.D.   On: 12/06/2020 02:32   MR 3D Recon At Scanner  Result Date: 12/05/2020 CLINICAL DATA:  Right upper quadrant pain, jaundice EXAM: MRI ABDOMEN WITHOUT AND WITH CONTRAST (INCLUDING MRCP) TECHNIQUE: Multiplanar multisequence MR imaging of the abdomen was performed both before and after the administration of intravenous contrast. Heavily T2-weighted images of the biliary and pancreatic ducts were obtained, and three-dimensional MRCP images were rendered by post processing. CONTRAST:  35mL GADAVIST GADOBUTROL 1 MMOL/ML IV SOLN COMPARISON:  CTA abdomen/pelvis dated 12/04/2020. Right upper quadrant ultrasound dated 12/04/2020. CT abdomen/pelvis dated 12/04/2020. FINDINGS: Motion degraded images. Lower chest: Lung bases are clear. Hepatobiliary: Liver is within normal limits. No suspicious/enhancing hepatic lesions. Gallbladder is unremarkable. No gallstones or associated inflammatory changes. Mild intrahepatic ductal dilatation. Common duct measures 8 mm (series 3/image 13). No choledocholithiasis is seen. On delayed imaging, there is mild peribiliary enhancement centrally in the liver (series 35/image 27). This appearance is nonspecific and may reflect cholangitis, although bile duct tumor/cholangiocarcinoma is not  excluded. Pancreas: 3.8 cm nonenhancing cyst in the pancreatic tail (series 5/image 11). Spleen:  Within normal limits. Adrenals/Urinary Tract:  Adrenal glands are within normal limits. Kidneys are within normal limits.  No hydronephrosis. Stomach/Bowel: Stomach is within normal limits. Visualized bowel is grossly unremarkable. Vascular/Lymphatic:  No evidence of abdominal aortic aneurysm. No suspicious abdominal lymphadenopathy. Other:  No abdominal ascites. Musculoskeletal: Multifocal osseous metastases. IMPRESSION: Motion degraded images. Mild intrahepatic ductal dilatation with delayed peribiliary enhancement, nonspecific. This may reflect cholangitis, although bile duct tumor/cholangiocarcinoma is not excluded. Common duct measures 8 mm.  No choledocholithiasis is seen. Additional ancillary findings as above. Electronically Signed   By: Julian Hy M.D.   On: 12/05/2020 00:37   MR ABDOMEN MRCP W WO CONTAST  Result Date: 12/05/2020 CLINICAL DATA:  Right upper quadrant pain, jaundice EXAM: MRI ABDOMEN WITHOUT AND WITH CONTRAST (INCLUDING MRCP) TECHNIQUE: Multiplanar multisequence MR imaging of the abdomen was performed both before and after the administration of intravenous contrast. Heavily T2-weighted images of the biliary and pancreatic ducts were obtained, and three-dimensional MRCP images were rendered by post processing. CONTRAST:  54mL GADAVIST GADOBUTROL 1 MMOL/ML IV SOLN COMPARISON:  CTA abdomen/pelvis dated 12/04/2020. Right upper quadrant ultrasound dated 12/04/2020. CT abdomen/pelvis dated 12/04/2020. FINDINGS: Motion degraded images. Lower chest: Lung bases are clear. Hepatobiliary: Liver is within normal limits. No suspicious/enhancing hepatic lesions. Gallbladder is unremarkable. No gallstones or associated inflammatory changes. Mild intrahepatic ductal dilatation. Common duct measures 8 mm (series 3/image 13). No choledocholithiasis is seen. On delayed imaging, there is mild peribiliary  enhancement centrally in the liver (series 35/image 27). This appearance is nonspecific and may reflect cholangitis, although bile duct  tumor/cholangiocarcinoma is not excluded. Pancreas: 3.8 cm nonenhancing cyst in the pancreatic tail (series 5/image 11). Spleen:  Within normal limits. Adrenals/Urinary Tract:  Adrenal glands are within normal limits. Kidneys are within normal limits.  No hydronephrosis. Stomach/Bowel: Stomach is within normal limits. Visualized bowel is grossly unremarkable. Vascular/Lymphatic:  No evidence of abdominal aortic aneurysm. No suspicious abdominal lymphadenopathy. Other:  No abdominal ascites. Musculoskeletal: Multifocal osseous metastases. IMPRESSION: Motion degraded images. Mild intrahepatic ductal dilatation with delayed peribiliary enhancement, nonspecific. This may reflect cholangitis, although bile duct tumor/cholangiocarcinoma is not excluded. Common duct measures 8 mm.  No choledocholithiasis is seen. Additional ancillary findings as above. Electronically Signed   By: Julian Hy M.D.   On: 12/05/2020 00:37   CT RENAL STONE STUDY  Result Date: 12/04/2020 CLINICAL DATA:  Flank pain since last evening. EXAM: CT ABDOMEN AND PELVIS WITHOUT CONTRAST TECHNIQUE: Multidetector CT imaging of the abdomen and pelvis was performed following the standard protocol without IV contrast. COMPARISON:  CT scan 11/15/2020 FINDINGS: Lower chest: The lung bases are clear of acute process. No pleural effusion or pulmonary lesions. The heart is normal in size. No pericardial effusion. The distal esophagus and aorta are unremarkable. Hepatobiliary: No hepatic lesions or intrahepatic biliary dilatation. The gallbladder is unremarkable. No common bile duct dilatation. Pancreas: Stable 2.7 cm cystic lesion in the pancreatic tail. No acute inflammation or ductal dilatation. Spleen: Normal size. No focal lesions. Adrenals/Urinary Tract: Adrenal glands and kidneys are unremarkable. No renal,  ureteral bladder calculi obvious mass without contrast. Stomach/Bowel: The stomach, duodenum, small bowel and colon are grossly normal. Vascular/Lymphatic: The aorta is normal in caliber. No atheroscerlotic calcifications. No mesenteric of retroperitoneal mass or adenopathy. Small scattered lymph nodes are noted. Reproductive: The prostate gland and seminal vesicles unremarkable. Other: No pelvic mass or adenopathy. No free pelvic fluid collections. No inguinal mass or adenopathy. No abdominal wall hernia or subcutaneous lesions. Musculoskeletal: Stable diffuse sclerotic metastatic bone disease related to patient's known prostate cancer. No acute bony findings are identified. IMPRESSION: 1. No acute abdominal/pelvic findings, mass lesions or adenopathy. 2. No renal, ureteral or bladder calculi or obvious mass without contrast. 3. Stable diffuse sclerotic metastatic bone disease related to patient's known prostate cancer. 4. Stable 2.7 cm cystic lesion in the pancreatic tail. Electronically Signed   By: Marijo Sanes M.D.   On: 12/04/2020 07:45   CT Angio Abd/Pel W and/or Wo Contrast  Result Date: 12/04/2020 CLINICAL DATA:  Chest pain short of breath nausea vomiting EXAM: CTA ABDOMEN AND PELVIS WITHOUT AND WITH CONTRAST TECHNIQUE: Multidetector CT imaging of the abdomen and pelvis was performed using the standard protocol during bolus administration of intravenous contrast. Multiplanar reconstructed images and MIPs were obtained and reviewed to evaluate the vascular anatomy. CONTRAST:  147mL OMNIPAQUE IOHEXOL 350 MG/ML SOLN COMPARISON:  CT 12/04/2020, 11/15/2020, 10/18/2020, PET CT 03/03/2017, CT 09/26/2020 FINDINGS: VASCULAR Aorta: Normal caliber aorta without aneurysm, dissection, vasculitis or significant stenosis. Celiac: Patent without evidence of aneurysm, dissection, vasculitis or significant stenosis. SMA: Patent without evidence of aneurysm, dissection, vasculitis or significant stenosis. Renals: Single  right renal artery with mild stenosis about a cm distal to the origin. Two left renal arteries, more cranial accessory artery is patent and without occlusion or stenosis. Dominant main renal artery demonstrates moderate severe focal stenosis of the proximal renal artery about 8 mm distal to the origin, coronal series 18 image 60. IMA: Patent without evidence of aneurysm, dissection, vasculitis or significant stenosis. Inflow: Patent without evidence of aneurysm, dissection,  vasculitis or significant stenosis. Proximal Outflow: Bilateral common femoral and visualized portions of the superficial and profunda femoral arteries are patent without evidence of aneurysm, dissection, vasculitis or significant stenosis. Review of the MIP images confirms the above findings. NON-VASCULAR Lower chest: Emphysema.  No acute 1 airspace disease or effusion. Hepatobiliary: No calcified gallstone. No focal hepatic abnormality. Mild intra hepatic biliary dilatation. Common duct diameter up to 8 mm. Pancreas: No inflammation. 2.9 cm cyst in the pancreatic tail no change. Spleen: Normal in size without focal abnormality. Adrenals/Urinary Tract: Adrenal glands are unremarkable. Kidneys are normal, without renal calculi, focal lesion, or hydronephrosis. Bladder is unremarkable. Stomach/Bowel: Stomach is within normal limits. Appendix not well seen but no right lower quadrant inflammatory process. No evidence of bowel wall thickening, distention, or inflammatory changes. Negative for intramural air. Liquid stools in the colon. Lymphatic: No suspicious lymph nodes Reproductive: Negative prostate Other: Negative for free air. Trace fluid in the right upper quadrant adjacent to the inferior margin of the right hepatic lobe Musculoskeletal: Diffuse skeletal sclerotic metastatic disease as noted on previous exams. IMPRESSION: VASCULAR 1. Negative for aortic dissection or acute occlusive disease. 2. Mild right stenosis of proximal right renal  artery. Moderate to marked focal stenosis of dominant proximal left renal artery. NON-VASCULAR 1. No CT evidence for acute intra-abdominal or pelvic abnormality. 2. Mild intra and extrahepatic biliary dilatation which may be correlated with LFTs with follow-up MR or ERCP is indicated 3. Stable sclerotic metastatic disease 4. Stable cystic lesion in the pancreatic tail Electronically Signed   By: Donavan Foil M.D.   On: 12/04/2020 18:39   US Abdomen Limited RUQ (LIVER/GB)  Result Date: 12/04/2020 CLINICAL DATA:  Right upper quadrant pain. EXAM: ULTRASOUND ABDOMEN LIMITED RIGHT UPPER QUADRANT COMPARISON:  CT abdomen pelvis from same day. FINDINGS: Gallbladder: No gallstones or wall thickening visualized. No sonographic Murphy sign noted by sonographer. Common bile duct: Diameter: 5 mm, normal. Liver: Mild central intrahepatic biliary dilatation. No focal lesion identified. Within normal limits in parenchymal echogenicity. Portal vein is patent on color Doppler imaging with normal direction of blood flow towards the liver. Other: None. IMPRESSION: 1. Mild central intrahepatic biliary dilatation without common bile duct dilatation. Consider further evaluation with ERCP or MRCP. Electronically Signed   By: Titus Dubin M.D.   On: 12/04/2020 11:11     Subjective: - no chest pain, shortness of breath, no abdominal pain, nausea or vomiting.   Discharge Exam: BP 112/72 (BP Location: Right Arm)   Pulse 67   Temp 98.2 F (36.8 C) (Oral)   Resp 20   Ht 6\' 2"  (1.88 m)   Wt 73.5 kg   SpO2 99%   BMI 20.80 kg/m   General: Pt is alert, awake, not in acute distress Cardiovascular: RRR, S1/S2 +, no rubs, no gallops Respiratory: CTA bilaterally, no wheezing, no rhonchi Abdominal: Soft, NT, ND, bowel sounds + Extremities: no edema, no cyanosis  The results of significant diagnostics from this hospitalization (including imaging, microbiology, ancillary and laboratory) are listed below for reference.      Microbiology: Recent Results (from the past 240 hour(s))  Resp Panel by RT-PCR (Flu A&B, Covid) Nasopharyngeal Swab     Status: None   Collection Time: 12/04/20  3:53 PM   Specimen: Nasopharyngeal Swab; Nasopharyngeal(NP) swabs in vial transport medium  Result Value Ref Range Status   SARS Coronavirus 2 by RT PCR NEGATIVE NEGATIVE Final    Comment: (NOTE) SARS-CoV-2 target nucleic acids are NOT DETECTED.  The SARS-CoV-2 RNA is generally detectable in upper respiratory specimens during the acute phase of infection. The lowest concentration of SARS-CoV-2 viral copies this assay can detect is 138 copies/mL. A negative result does not preclude SARS-Cov-2 infection and should not be used as the sole basis for treatment or other patient management decisions. A negative result may occur with  improper specimen collection/handling, submission of specimen other than nasopharyngeal swab, presence of viral mutation(s) within the areas targeted by this assay, and inadequate number of viral copies(<138 copies/mL). A negative result must be combined with clinical observations, patient history, and epidemiological information. The expected result is Negative.  Fact Sheet for Patients:  EntrepreneurPulse.com.au  Fact Sheet for Healthcare Providers:  IncredibleEmployment.be  This test is no t yet approved or cleared by the [place] FDA and  has been authorized for detection and/or diagnosis of SARS-CoV-2 by FDA under an Emergency Use Authorization (EUA). This EUA will remain  in effect (meaning this test can be used) for the duration of the COVID-19 declaration under Section 564(b)(1) of the Act, 21 U.S.C.section 360bbb-3(b)(1), unless the authorization is terminated  or revoked sooner.       Influenza A by PCR NEGATIVE NEGATIVE Final   Influenza B by PCR NEGATIVE NEGATIVE Final    Comment: (NOTE) The Xpert Xpress SARS-CoV-2/FLU/RSV plus assay is  intended as an aid in the diagnosis of influenza from Nasopharyngeal swab specimens and should not be used as a sole basis for treatment. Nasal washings and aspirates are unacceptable for Xpert Xpress SARS-CoV-2/FLU/RSV testing.  Fact Sheet for Patients: EntrepreneurPulse.com.au  Fact Sheet for Healthcare Providers: IncredibleEmployment.be  This test is not yet approved or cleared by the [place] FDA and has been authorized for detection and/or diagnosis of SARS-CoV-2 by FDA under an Emergency Use Authorization (EUA). This EUA will remain in effect (meaning this test can be used) for the duration of the COVID-19 declaration under Section 564(b)(1) of the Act, 21 U.S.C. section 360bbb-3(b)(1), unless the authorization is terminated or revoked.  Performed at [facility], [address] [place], [place] [address]      Labs: Basic Metabolic Panel: Recent Labs  Lab 12/04/20 0735 12/05/20 0445 12/08/20 0309  NA 137 138 134*  K 3.3* 4.0 3.9  CL 99 104 103  CO2 25 28 23   GLUCOSE 120* 98 120*  BUN 9 10 8   CREATININE 0.71 0.58* 0.62  CALCIUM 9.2 7.9* 8.9  MG 2.3 2.4  --    Liver Function Tests: Recent Labs  Lab 12/04/20 0735 12/05/20 0445 12/08/20 0309  AST 76* 31 45*  ALT 102* 66* 67*  ALKPHOS 720* 577* 682*  BILITOT 1.1 0.9 1.2  PROT 7.9 6.2* 6.8  ALBUMIN 4.8 3.8 4.3   CBC: Recent Labs  Lab 12/04/20 0745 12/05/20 0445 12/08/20 0309  WBC 9.9 10.2 10.4  NEUTROABS 6.7 6.7 6.7  HGB 12.7* 11.2* 12.0*  HCT 38.1* 33.6* 37.0*  MCV 89.9 88.9 90.7  PLT 183 151 156   CBG: No results for input(s): GLUCAP in the last 168 hours. Hgb A1c No results for input(s): HGBA1C in the last 72 hours. Lipid Profile No results for input(s): CHOL, HDL, LDLCALC, TRIG, CHOLHDL, LDLDIRECT in the last 72 hours. Thyroid function studies No results for input(s): TSH, T4TOTAL, T3FREE, THYROIDAB in the last 72 hours.  Invalid  input(s): FREET3 Urinalysis    Component Value Date/Time   COLORURINE YELLOW 12/04/2020 [place] 12/04/2020 1059   LABSPEC  1.025 12/04/2020 1059   PHURINE 7.0 12/04/2020 [place] 12/04/2020 1059   HGBUR NEGATIVE 12/04/2020 1059   BILIRUBINUR NEGATIVE 12/04/2020 1059   KETONESUR 15 (A) 12/04/2020 1059   PROTEINUR NEGATIVE 12/04/2020 1059   NITRITE NEGATIVE 12/04/2020 1059   LEUKOCYTESUR TRACE (A) 12/04/2020 1059    FURTHER DISCHARGE INSTRUCTIONS:   Get Medicines reviewed and adjusted: Please take all your medications with you for your next visit with your Primary MD   Laboratory/radiological data: Please request your Primary MD to go over all hospital tests and procedure/radiological results at the follow up, please ask your Primary MD to get all Hospital records sent to his/her office.   In some cases, they will be blood work, cultures and biopsy results pending at the time of your discharge. Please request that your primary care M.D. goes through all the records of your hospital data and follows up on these results.   Also Note the following: If you experience worsening of your admission symptoms, develop shortness of breath, life threatening emergency, suicidal or homicidal thoughts you must seek medical attention immediately by calling 911 or calling your MD immediately  if symptoms less severe.   You must read complete instructions/literature along with all the possible adverse reactions/side effects for all the Medicines you take and that have been prescribed to you. Take any new Medicines after you have completely understood and accpet all the possible adverse reactions/side effects.    Do not drive when taking Pain medications or sleeping medications (Benzodaizepines)   Do not take more than prescribed Pain, Sleep and Anxiety Medications. It is not advisable to combine anxiety,sleep and pain medications without talking with your primary care  practitioner   Special Instructions: If you have smoked or chewed Tobacco  in the last 2 yrs please stop smoking, stop any regular Alcohol  and or any Recreational drug use.   Wear Seat belts while driving.   Please note: You were cared for by a hospitalist during your hospital stay. Once you are discharged, your primary care physician will handle any further medical issues. Please note that NO REFILLS for any discharge medications will be authorized once you are discharged, as it is imperative that you return to your primary care physician (or establish a relationship with a primary care physician if you do not have one) for your post hospital discharge needs so that they can reassess your need for medications and monitor your lab values.  Time coordinating discharge: 40 minutes  SIGNED:  Marzetta Board, MD, PhD 12/10/2020, 8:53 AM

## 2020-12-12 ENCOUNTER — Inpatient Hospital Stay: Payer: Medicare Other

## 2020-12-12 ENCOUNTER — Inpatient Hospital Stay: Payer: Medicare Other | Admitting: Family

## 2020-12-12 ENCOUNTER — Telehealth: Payer: Self-pay | Admitting: *Deleted

## 2020-12-12 NOTE — Telephone Encounter (Signed)
Received a call from [place] in [facility] on [facility] regarding patients newly prescribed narcotic regimen from hospital discharge.  Increase in dosage of Duragesic and Oxy and wanted an ok with Dr Marin Olp.  Dr Marin Olp messaged privately and said this is the regimen from the palliative care team and he is ok with this.  Morey [facility]mingbird notified of this.

## 2020-12-13 ENCOUNTER — Other Ambulatory Visit: Payer: Self-pay

## 2020-12-13 ENCOUNTER — Inpatient Hospital Stay: Payer: Medicare Other | Attending: Hematology & Oncology | Admitting: Nurse Practitioner

## 2020-12-13 VITALS — BP 107/78 | HR 97 | Temp 97.9°F | Resp 18 | Ht 74.0 in | Wt 145.9 lb

## 2020-12-13 DIAGNOSIS — K5901 Slow transit constipation: Secondary | ICD-10-CM | POA: Diagnosis not present

## 2020-12-13 DIAGNOSIS — R531 Weakness: Secondary | ICD-10-CM

## 2020-12-13 DIAGNOSIS — Z515 Encounter for palliative care: Secondary | ICD-10-CM

## 2020-12-13 DIAGNOSIS — C799 Secondary malignant neoplasm of unspecified site: Secondary | ICD-10-CM | POA: Diagnosis not present

## 2020-12-13 DIAGNOSIS — E58 Dietary calcium deficiency: Secondary | ICD-10-CM | POA: Insufficient documentation

## 2020-12-13 DIAGNOSIS — C61 Malignant neoplasm of prostate: Secondary | ICD-10-CM | POA: Insufficient documentation

## 2020-12-13 DIAGNOSIS — G893 Neoplasm related pain (acute) (chronic): Secondary | ICD-10-CM

## 2020-12-13 MED ORDER — FENTANYL 75 MCG/HR TD PT72: 1.0000 | MEDICATED_PATCH | TRANSDERMAL | 0 refills | Status: DC

## 2020-12-13 MED ORDER — NALOXEGOL OXALATE 12.5 MG PO TABS: 12.5000 mg | ORAL_TABLET | Freq: Every day | ORAL | 0 refills | Status: DC

## 2020-12-13 NOTE — Progress Notes (Signed)
  Outpatient Palliative Care  [phone] ________________________________ Nursing Assessment:  Name: Ryan Davis        MRN: [id number]  Date of Service: 12/13/2020 DOB: 04-10-1960 Visit Type: New patient in clinic, hospital follow up  Support at Visit: Unaccompanied   Review of Systems: General: Ryan Davis reports weakness at times and napping throughout the day. Sleeping good at night.  Weight loss of 17lbs.      Neuro: Reports numbness/tingling in feet, continuously.  Reports dizziness and blurred vision at times. Cardiac: None Pulmonary: None  GI: Ryan Davis states his appetite is 50% normal but that he did cook and eat a big breakfast today. No supplements.  Reports constipation, no BM since hospital stay. Encouraged Ryan Davis to continue to take his bowel regimen. GU: Reports having urination problems during hospital stay but none at home now.  Integumentary: None Psychological: When asked, Ryan Davis reports no anxiety or depression.  Reports a friend passing away this morning, emotional listening and support provided.    Medication Changes/Additions: Ryan Davis brought the medications he is taking to visit. We went over these along with discharge summary from hospital stay. Instructed Ryan Davis to not take Morphine tablets per AVS.     Pain Review: Scale of 0-10: "a little" sitting, but reports a 7 when pain hits Location: Lower back   Description: Aching Frequency: continuous aching, experiences an acute bout of pain every other day Relief: states pain meds "ease" pain Effects: Ryan Davis stated that this pain makes it hard to enjoy time with his family  Social Review: Living Situation: Lives with wife and grandson at home Support: Wife and family   Falls in the last 3 months? No  Assistive Device Use? Cane  Functional Status: Independent   ACP Form Review: Yes  Family/Patient Concerns: Concerned with pain control and management.  Provider  notified of assessment and patient/family concerns. Patient instructed to call with any questions or concerns.

## 2020-12-13 NOTE — Progress Notes (Signed)
[place]  [phone] [phone]   Name: Ryan Davis Date: 12/13/2020 MRN: [id number]  DOB: Oct 30, 1960  Patient Care Team: Rip Harbour as PCP - General (Internal Medicine)    REASON FOR CONSULTATION: Ryan Davis is a 60 y.o. 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  Palliative ask to see for symptom management.  SOCIAL HISTORY:     reports that he quit smoking about 7 months ago. His smoking use included cigarettes. He has a 19.50 pack-year smoking history. He has never used smokeless tobacco. He reports that he does not currently use alcohol. He reports that he does not use drugs.  ADVANCE DIRECTIVES:  Patient has a Buyer, retail. His sister Ryan Davis) is primary agent and his brother Ryan Davis) is secondary.   CODE STATUS:   PAST MEDICAL HISTORY: Past Medical History:  Diagnosis Date   A-fib Ryan Davis [facility])    see 03-28-16 admission   Back pain without radiculopathy 03/28/2016   Dyspnea    endorses since treatment radiation , still gets SOB occasionally    Dysrhythmia    afib    Goals of care, counseling/discussion 06/24/2017   History of radiation therapy 09/16/16-10/06/16   35 Gy in 14 fractions to the lumbar spine   Nocturia    Prostate cancer metastatic to bone ([place]) 03/31/2016   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[place]) 03/31/2016    PAST SURGICAL HISTORY:  Past Surgical History:  Procedure Laterality Date   IR IMAGING GUIDED PORT INSERTION  09/05/2020   MULTIPLE EXTRACTIONS WITH ALVEOLOPLASTY N/A 11/19/2016   Procedure: EXTRACTION OF TOOTH #'S 1,3,5-7,9-17, AND 20- 29 WITH ALVEOLOPLASTY, BILATERAL MANDIBULAR TORI REDUCTIONS AND BILATERAL MAXILLARY BUCCAL EXOSTOSES REDUCTIONS;  Surgeon: Lenn Cal, DDS;  Location: [facility]  ORS;  Service: Oral Surgery;  Laterality: N/A;   NO PAST SURGERIES      HEMATOLOGY/ONCOLOGY HISTORY:  Oncology History  Prostate cancer metastatic to multiple sites [facility])  03/31/2016 Initial Diagnosis   Prostate cancer metastatic to multiple sites [facility])   07/08/2017 - 07/08/2017 Chemotherapy   The patient had dexamethasone (DECADRON) 4 MG tablet, 8 mg, Oral, 2 times daily, 0 of 1 cycle, Start date: --, End date: -- pegfilgrastim-cbqv (UDENYCA) injection 6 mg, 6 mg, Subcutaneous, Once, 0 of 4 cycles DOCEtaxel (TAXOTERE) 120 mg in sodium chloride 0.9 % 250 mL chemo infusion, 67.5 mg/m2 = 120 mg (90 % of original dose 75 mg/m2), Intravenous,  Once, 0 of 4 cycles Dose modification: 67.5 mg/m2 (90 % of original dose 75 mg/m2, Cycle 1, Reason: Provider Judgment)   for chemotherapy treatment.     08/27/2020 Cancer Staging   Staging form: Prostate, AJCC 8th Edition - Clinical stage from 08/27/2020: Stage IVB (cT2c, cNX, pM1b, PSA: 700, Grade Group: 4) - Signed by Ryan Napoleon, MD on 08/27/2020 Prostate specific antigen (PSA) range: 20 or greater Gleason score: 8 Histologic grading system: 5 grade system    09/06/2020 - 10/22/2020 Chemotherapy   Patient is on Treatment Plan : PROSTATE Docetaxel + Prednisone q21d     Prostate cancer metastatic to bone ([place])  03/31/2016 Initial Diagnosis   Prostate cancer metastatic to bone ([place])   07/08/2017 - 07/08/2017 Chemotherapy   The patient had dexamethasone (DECADRON) 4 MG tablet, 8 mg, Oral, 2 times daily, 0 of 1 cycle, Start date: --, End  date: -- pegfilgrastim-cbqv [facility]) injection 6 mg, 6 mg, Subcutaneous, Once, 0 of 4 cycles DOCEtaxel (TAXOTERE) 120 mg in sodium chloride 0.9 % 250 mL chemo infusion, 67.5 mg/m2 = 120 mg (90 % of original dose 75 mg/m2), Intravenous,  Once, 0 of 4 cycles Dose modification: 67.5 mg/m2 (90 % of original dose 75 mg/m2, Cycle 1, Reason: Provider Judgment)   for chemotherapy treatment.     09/06/2020 - 10/22/2020  Chemotherapy   Patient is on Treatment Plan : PROSTATE Docetaxel + Prednisone q21d       ALLERGIES:  is allergic to contrast media [iodinated diagnostic agents] and other.  MEDICATIONS:  Current Outpatient Medications  Medication Sig Dispense Refill   famotidine (PEPCID) 40 MG tablet Take 1 tablet (40 mg total) by mouth 2 (two) times daily. 60 tablet 6   fentaNYL (DURAGESIC) 100 MCG/HR Place 2 patches onto the skin every other day. 6 patch 0   fentaNYL (DURAGESIC) 75 MCG/HR Place 1 patch onto the skin every other day. 15 patch 0   gabapentin (NEURONTIN) 800 MG tablet Take 800 mg by mouth 3 (three) times daily. May take an additional 400mg  as needed for pain     metoprolol tartrate (LOPRESSOR) 25 MG tablet TAKE 1/2 TABLET(12.5 MG) BY MOUTH TWICE DAILY (Patient taking differently: Take 12.5 mg by mouth 2 (two) times daily.) 60 tablet 3   oxyCODONE (ROXICODONE) 30 MG immediate release tablet Take 1 tablet (30 mg total) by mouth every 3 (three) hours as needed for up to 7 days for moderate pain. 56 tablet 0   pantoprazole (PROTONIX) 40 MG tablet Take 1 tablet (40 mg total) by mouth daily. 30 tablet 0   Plecanatide (TRULANCE) 3 MG TABS Take by mouth.     senna (SENOKOT) 8.6 MG TABS tablet Take 2 tablets (17.2 mg total) by mouth 2 (two) times daily. 120 tablet 0   cyclobenzaprine (FLEXERIL) 10 MG tablet Take 1 tablet (10 mg total) by mouth 3 (three) times daily. (Patient not taking: Reported on 12/13/2020) 90 tablet 0   dexamethasone (DECADRON) 2 MG tablet Take 1 tablet (2 mg total) by mouth every 8 (eight) hours for 1 day, THEN 1 tablet (2 mg total) every 12 (twelve) hours for 1 day, THEN 1 tablet (2 mg total) daily for 3 days. (Patient not taking: Reported on 12/13/2020) 8 tablet 0   naloxegol oxalate (MOVANTIK) 12.5 MG TABS tablet Take 1 tablet (12.5 mg total) by mouth daily. (Patient not taking: Reported on 12/13/2020) 30 tablet 0   Naloxone HCl 0.4 MG/0.4ML SOAJ Use in the event of accidental  overdose of opioids. 0.4 mL 0   polyethylene glycol (MIRALAX / GLYCOLAX) 17 g packet Take 17 g by mouth 2 (two) times daily. 60 each 1   tamsulosin (FLOMAX) 0.4 MG CAPS capsule Take 1 capsule (0.4 mg total) by mouth daily. (Patient not taking: Reported on 12/13/2020) 30 capsule 1   No current facility-administered medications for this visit.   Facility-Administered Medications Ordered in Other Visits  Medication Dose Route Frequency Provider Last Rate Last Admin   SONAFINE emulsion 1 application  1 application Topical BID Gery Pray, MD        VITAL SIGNS: BP 107/78 (BP Location: Right Arm, Patient Position: Sitting)   Pulse 97   Temp 97.9 F (36.6 C) (Tympanic)   Resp 18   Ht 6\' 2"  (1.88 m)   Wt 145 lb 14.4 oz (66.2 kg)   SpO2 100%   BMI 18.73 kg/m  Filed Weights   12/13/20 1223  Weight: 145 lb 14.4 oz (66.2 kg)    Estimated body mass index is 18.73 kg/m as calculated from the following:   Height as of this encounter: 6\' 2"  (1.88 m).   Weight as of this encounter: 145 lb 14.4 oz (66.2 kg).  LABS: CBC:    Component Value Date/Time   WBC 10.4 12/08/2020 0309   HGB 12.0 (L) 12/08/2020 0309   HGB 12.4 (L) 11/12/2020 0746   HGB 12.2 (L) 12/16/2016 1022   HCT 37.0 (L) 12/08/2020 0309   HCT 36.4 (L) 12/16/2016 1022   PLT 156 12/08/2020 0309   PLT 217 11/12/2020 0746   PLT 185 12/16/2016 1022   MCV 90.7 12/08/2020 0309   MCV 92 12/16/2016 1022   NEUTROABS 6.7 12/08/2020 0309   NEUTROABS 4.6 12/16/2016 1022   LYMPHSABS 2.2 12/08/2020 0309   LYMPHSABS 1.2 12/16/2016 1022   MONOABS 0.9 12/08/2020 0309   EOSABS 0.1 12/08/2020 0309   EOSABS 0.4 12/16/2016 1022   BASOSABS 0.1 12/08/2020 0309   BASOSABS 0.0 12/16/2016 1022   Comprehensive Metabolic Panel:    Component Value Date/Time   NA 134 (L) 12/08/2020 0309   NA 143 12/16/2016 1022   K 3.9 12/08/2020 0309   K 3.5 12/16/2016 1022   CL 103 12/08/2020 0309   CL 101 12/16/2016 1022   CO2 23 12/08/2020 0309    CO2 33 12/16/2016 1022   BUN 8 12/08/2020 0309   BUN 4 (L) 12/16/2016 1022   CREATININE 0.62 12/08/2020 0309   CREATININE 0.62 11/12/2020 0746   CREATININE 0.8 12/16/2016 1022   GLUCOSE 120 (H) 12/08/2020 0309   GLUCOSE 109 12/16/2016 1022   CALCIUM 8.9 12/08/2020 0309   CALCIUM 9.4 12/16/2016 1022   AST 45 (H) 12/08/2020 0309   AST 29 11/12/2020 0746   ALT 67 (H) 12/08/2020 0309   ALT 42 11/12/2020 0746   ALT 16 12/16/2016 1022   ALKPHOS 682 (H) 12/08/2020 0309   ALKPHOS 77 12/16/2016 1022   BILITOT 1.2 12/08/2020 0309   BILITOT 0.6 11/12/2020 0746   PROT 6.8 12/08/2020 0309   PROT 6.7 12/16/2016 1022   ALBUMIN 4.3 12/08/2020 0309   ALBUMIN 3.7 12/16/2016 1022    RADIOGRAPHIC STUDIES: CT ABDOMEN PELVIS WO CONTRAST  Result Date: 11/15/2020 CLINICAL DATA:  Acute abdominal pain EXAM: CT ABDOMEN AND PELVIS WITHOUT CONTRAST TECHNIQUE: Multidetector CT imaging of the abdomen and pelvis was performed following the standard protocol without IV contrast. COMPARISON:  10/18/2020 FINDINGS: Lower chest: Mild scarring is noted in the bases bilaterally. Hepatobiliary: No focal liver abnormality is seen. No gallstones, gallbladder wall thickening, or biliary dilatation. Pancreas: There is a persistent but stable 3.6 cm cystic lesion in the tail of the pancreas. This has been stable dating back to 2018. No other pancreatic abnormality is noted. Spleen: Scattered calcified granulomas are seen. Adrenals/Urinary Tract: Adrenal glands are within normal limits. The kidneys demonstrate no renal calculi or obstructive changes. The bladder is partially distended. Stomach/Bowel: Fecal material is noted throughout the colon. No obstructive changes are noted. The appendix is not well visualized although no inflammatory changes are seen to suggest appendicitis. Small bowel and stomach are within normal limits. Vascular/Lymphatic: No significant vascular findings are present. No enlarged abdominal or pelvic lymph  nodes. Reproductive: Prostate is unremarkable. Other: No abdominal wall hernia or abnormality. No abdominopelvic ascites. Musculoskeletal: Diffuse sclerotic foci are noted consistent with metastatic disease from the known prostate carcinoma. Overall appearance  is similar to previous exam. IMPRESSION: Stable pancreatic tail cystic lesion unchanged from 2018. Findings of prior granulomatous disease. Bony metastatic disease from the patient's known prostate carcinoma. No acute abnormality is noted. Electronically Signed   By: Inez Catalina M.D.   On: 11/15/2020 00:29   CT Angio Chest Pulmonary Embolism (PE) W or WO Contrast  Result Date: 12/04/2020 CLINICAL DATA:  Chest pain short of breath EXAM: CT ANGIOGRAPHY CHEST WITH CONTRAST TECHNIQUE: Multidetector CT imaging of the chest was performed using the standard protocol during bolus administration of intravenous contrast. Multiplanar CT image reconstructions and MIPs were obtained to evaluate the vascular anatomy. CONTRAST:  130mL OMNIPAQUE IOHEXOL 350 MG/ML SOLN COMPARISON:  CT 10/18/2020 FINDINGS: Cardiovascular: Satisfactory opacification of the pulmonary arteries to the segmental level. No evidence of pulmonary embolism. Normal heart size. No pericardial effusion. Nonaneurysmal aorta. No dissection is seen. Mediastinum/Nodes: No enlarged mediastinal, hilar, or axillary lymph nodes. Thyroid gland, trachea, and esophagus demonstrate no significant findings. Lungs/Pleura: Emphysema. No acute airspace disease, pleural effusion, or pneumothorax Upper Abdomen: No acute abnormality. Musculoskeletal: Diffuse skeletal metastatic disease as before Review of the MIP images confirms the above findings. IMPRESSION: 1. Negative for acute pulmonary embolus or aortic dissection 2. Emphysema without acute airspace disease 3. Diffuse skeletal metastatic disease Aortic Atherosclerosis (ICD10-I70.0) and Emphysema (ICD10-J43.9). Electronically Signed   By: Donavan Foil M.D.   On:  12/04/2020 18:45   MR THORACIC SPINE W WO CONTRAST  Result Date: 12/06/2020 CLINICAL DATA:  Initial evaluation for metastatic prostate cancer, surveillance. EXAM: MRI THORACIC WITHOUT AND WITH CONTRAST TECHNIQUE: Multiplanar and multiecho pulse sequences of the thoracic spine were obtained without and with intravenous contrast. CONTRAST:  43mL GADAVIST GADOBUTROL 1 MMOL/ML IV SOLN COMPARISON:  Prior CT from 12/04/2020. FINDINGS: Alignment: Physiologic with preservation of the normal thoracic kyphosis. No listhesis. Vertebrae: Widespread metastatic disease seen throughout the visualized osseous structures. There is involvement of essentially all levels, with diffusely abnormal appearance of the visualized bone marrow. Vertebral body height maintained without associated pathologic fracture. No extra osseous or epidural extension of tumor. Cord: Normal signal and morphology. No epidural or intracanalicular tumor. Paraspinal and other soft tissues: Paraspinous soft tissues demonstrate no acute finding. 3.3 cm lipoma noted within the para median musculature of the left upper back. Trace layering bilateral pleural effusions noted. Approximate 3.7 cm cystic lesion at the pancreatic tail noted, stable from prior, and better evaluated on prior CTs. Disc levels: T2-3: Disc bulge with small right paracentral to foraminal disc protrusion (series 24, image 5). No significant spinal stenosis. Foramina remain patent. T5-6: Left paracentral disc protrusion indents the left ventral thecal sac (series 24, image 6). No significant canal or foraminal stenosis. Otherwise, no other significant disc pathology seen within the thoracic spine. No other stenosis or evidence for neural impingement. IMPRESSION: 1. Widespread metastatic disease throughout the visualized osseous structures. No associated pathologic fracture. No extra osseous or epidural extension. No neural impingement. 2. Small disc protrusions at T2-3 and T5-6 without  significant stenosis. Electronically Signed   By: Jeannine Boga M.D.   On: 12/06/2020 02:32   MR 3D Recon At Scanner  Result Date: 12/05/2020 CLINICAL DATA:  Right upper quadrant pain, jaundice EXAM: MRI ABDOMEN WITHOUT AND WITH CONTRAST (INCLUDING MRCP) TECHNIQUE: Multiplanar multisequence MR imaging of the abdomen was performed both before and after the administration of intravenous contrast. Heavily T2-weighted images of the biliary and pancreatic ducts were obtained, and three-dimensional MRCP images were rendered by post processing. CONTRAST:  58mL  GADAVIST GADOBUTROL 1 MMOL/ML IV SOLN COMPARISON:  CTA abdomen/pelvis dated 12/04/2020. Right upper quadrant ultrasound dated 12/04/2020. CT abdomen/pelvis dated 12/04/2020. FINDINGS: Motion degraded images. Lower chest: Lung bases are clear. Hepatobiliary: Liver is within normal limits. No suspicious/enhancing hepatic lesions. Gallbladder is unremarkable. No gallstones or associated inflammatory changes. Mild intrahepatic ductal dilatation. Common duct measures 8 mm (series 3/image 13). No choledocholithiasis is seen. On delayed imaging, there is mild peribiliary enhancement centrally in the liver (series 35/image 27). This appearance is nonspecific and may reflect cholangitis, although bile duct tumor/cholangiocarcinoma is not excluded. Pancreas: 3.8 cm nonenhancing cyst in the pancreatic tail (series 5/image 11). Spleen:  Within normal limits. Adrenals/Urinary Tract:  Adrenal glands are within normal limits. Kidneys are within normal limits.  No hydronephrosis. Stomach/Bowel: Stomach is within normal limits. Visualized bowel is grossly unremarkable. Vascular/Lymphatic:  No evidence of abdominal aortic aneurysm. No suspicious abdominal lymphadenopathy. Other:  No abdominal ascites. Musculoskeletal: Multifocal osseous metastases. IMPRESSION: Motion degraded images. Mild intrahepatic ductal dilatation with delayed peribiliary enhancement, nonspecific. This  may reflect cholangitis, although bile duct tumor/cholangiocarcinoma is not excluded. Common duct measures 8 mm.  No choledocholithiasis is seen. Additional ancillary findings as above. Electronically Signed   By: Julian Hy M.D.   On: 12/05/2020 00:37   MR ABDOMEN MRCP W WO CONTAST  Result Date: 12/05/2020 CLINICAL DATA:  Right upper quadrant pain, jaundice EXAM: MRI ABDOMEN WITHOUT AND WITH CONTRAST (INCLUDING MRCP) TECHNIQUE: Multiplanar multisequence MR imaging of the abdomen was performed both before and after the administration of intravenous contrast. Heavily T2-weighted images of the biliary and pancreatic ducts were obtained, and three-dimensional MRCP images were rendered by post processing. CONTRAST:  76mL GADAVIST GADOBUTROL 1 MMOL/ML IV SOLN COMPARISON:  CTA abdomen/pelvis dated 12/04/2020. Right upper quadrant ultrasound dated 12/04/2020. CT abdomen/pelvis dated 12/04/2020. FINDINGS: Motion degraded images. Lower chest: Lung bases are clear. Hepatobiliary: Liver is within normal limits. No suspicious/enhancing hepatic lesions. Gallbladder is unremarkable. No gallstones or associated inflammatory changes. Mild intrahepatic ductal dilatation. Common duct measures 8 mm (series 3/image 13). No choledocholithiasis is seen. On delayed imaging, there is mild peribiliary enhancement centrally in the liver (series 35/image 27). This appearance is nonspecific and may reflect cholangitis, although bile duct tumor/cholangiocarcinoma is not excluded. Pancreas: 3.8 cm nonenhancing cyst in the pancreatic tail (series 5/image 11). Spleen:  Within normal limits. Adrenals/Urinary Tract:  Adrenal glands are within normal limits. Kidneys are within normal limits.  No hydronephrosis. Stomach/Bowel: Stomach is within normal limits. Visualized bowel is grossly unremarkable. Vascular/Lymphatic:  No evidence of abdominal aortic aneurysm. No suspicious abdominal lymphadenopathy. Other:  No abdominal ascites.  Musculoskeletal: Multifocal osseous metastases. IMPRESSION: Motion degraded images. Mild intrahepatic ductal dilatation with delayed peribiliary enhancement, nonspecific. This may reflect cholangitis, although bile duct tumor/cholangiocarcinoma is not excluded. Common duct measures 8 mm.  No choledocholithiasis is seen. Additional ancillary findings as above. Electronically Signed   By: Julian Hy M.D.   On: 12/05/2020 00:37   CT RENAL STONE STUDY  Result Date: 12/04/2020 CLINICAL DATA:  Flank pain since last evening. EXAM: CT ABDOMEN AND PELVIS WITHOUT CONTRAST TECHNIQUE: Multidetector CT imaging of the abdomen and pelvis was performed following the standard protocol without IV contrast. COMPARISON:  CT scan 11/15/2020 FINDINGS: Lower chest: The lung bases are clear of acute process. No pleural effusion or pulmonary lesions. The heart is normal in size. No pericardial effusion. The distal esophagus and aorta are unremarkable. Hepatobiliary: No hepatic lesions or intrahepatic biliary dilatation. The gallbladder is unremarkable. No common  bile duct dilatation. Pancreas: Stable 2.7 cm cystic lesion in the pancreatic tail. No acute inflammation or ductal dilatation. Spleen: Normal size. No focal lesions. Adrenals/Urinary Tract: Adrenal glands and kidneys are unremarkable. No renal, ureteral bladder calculi obvious mass without contrast. Stomach/Bowel: The stomach, duodenum, small bowel and colon are grossly normal. Vascular/Lymphatic: The aorta is normal in caliber. No atheroscerlotic calcifications. No mesenteric of retroperitoneal mass or adenopathy. Small scattered lymph nodes are noted. Reproductive: The prostate gland and seminal vesicles unremarkable. Other: No pelvic mass or adenopathy. No free pelvic fluid collections. No inguinal mass or adenopathy. No abdominal wall hernia or subcutaneous lesions. Musculoskeletal: Stable diffuse sclerotic metastatic bone disease related to patient's known prostate  cancer. No acute bony findings are identified. IMPRESSION: 1. No acute abdominal/pelvic findings, mass lesions or adenopathy. 2. No renal, ureteral or bladder calculi or obvious mass without contrast. 3. Stable diffuse sclerotic metastatic bone disease related to patient's known prostate cancer. 4. Stable 2.7 cm cystic lesion in the pancreatic tail. Electronically Signed   By: Marijo Sanes M.D.   On: 12/04/2020 07:45   CT Angio Abd/Pel W and/or Wo Contrast  Result Date: 12/04/2020 CLINICAL DATA:  Chest pain short of breath nausea vomiting EXAM: CTA ABDOMEN AND PELVIS WITHOUT AND WITH CONTRAST TECHNIQUE: Multidetector CT imaging of the abdomen and pelvis was performed using the standard protocol during bolus administration of intravenous contrast. Multiplanar reconstructed images and MIPs were obtained and reviewed to evaluate the vascular anatomy. CONTRAST:  141mL OMNIPAQUE IOHEXOL 350 MG/ML SOLN COMPARISON:  CT 12/04/2020, 11/15/2020, 10/18/2020, PET CT 03/03/2017, CT 09/26/2020 FINDINGS: VASCULAR Aorta: Normal caliber aorta without aneurysm, dissection, vasculitis or significant stenosis. Celiac: Patent without evidence of aneurysm, dissection, vasculitis or significant stenosis. SMA: Patent without evidence of aneurysm, dissection, vasculitis or significant stenosis. Renals: Single right renal artery with mild stenosis about a cm distal to the origin. Two left renal arteries, more cranial accessory artery is patent and without occlusion or stenosis. Dominant main renal artery demonstrates moderate severe focal stenosis of the proximal renal artery about 8 mm distal to the origin, coronal series 18 image 60. IMA: Patent without evidence of aneurysm, dissection, vasculitis or significant stenosis. Inflow: Patent without evidence of aneurysm, dissection, vasculitis or significant stenosis. Proximal Outflow: Bilateral common femoral and visualized portions of the superficial and profunda femoral arteries are  patent without evidence of aneurysm, dissection, vasculitis or significant stenosis. Review of the MIP images confirms the above findings. NON-VASCULAR Lower chest: Emphysema.  No acute 1 airspace disease or effusion. Hepatobiliary: No calcified gallstone. No focal hepatic abnormality. Mild intra hepatic biliary dilatation. Common duct diameter up to 8 mm. Pancreas: No inflammation. 2.9 cm cyst in the pancreatic tail no change. Spleen: Normal in size without focal abnormality. Adrenals/Urinary Tract: Adrenal glands are unremarkable. Kidneys are normal, without renal calculi, focal lesion, or hydronephrosis. Bladder is unremarkable. Stomach/Bowel: Stomach is within normal limits. Appendix not well seen but no right lower quadrant inflammatory process. No evidence of bowel wall thickening, distention, or inflammatory changes. Negative for intramural air. Liquid stools in the colon. Lymphatic: No suspicious lymph nodes Reproductive: Negative prostate Other: Negative for free air. Trace fluid in the right upper quadrant adjacent to the inferior margin of the right hepatic lobe Musculoskeletal: Diffuse skeletal sclerotic metastatic disease as noted on previous exams. IMPRESSION: VASCULAR 1. Negative for aortic dissection or acute occlusive disease. 2. Mild right stenosis of proximal right renal artery. Moderate to marked focal stenosis of dominant proximal left renal artery. NON-VASCULAR  1. No CT evidence for acute intra-abdominal or pelvic abnormality. 2. Mild intra and extrahepatic biliary dilatation which may be correlated with LFTs with follow-up MR or ERCP is indicated 3. Stable sclerotic metastatic disease 4. Stable cystic lesion in the pancreatic tail Electronically Signed   By: Donavan Foil M.D.   On: 12/04/2020 18:39   US Abdomen Limited RUQ (LIVER/GB)  Result Date: 12/04/2020 CLINICAL DATA:  Right upper quadrant pain. EXAM: ULTRASOUND ABDOMEN LIMITED RIGHT UPPER QUADRANT COMPARISON:  CT abdomen pelvis from  same day. FINDINGS: Gallbladder: No gallstones or wall thickening visualized. No sonographic Murphy sign noted by sonographer. Common bile duct: Diameter: 5 mm, normal. Liver: Mild central intrahepatic biliary dilatation. No focal lesion identified. Within normal limits in parenchymal echogenicity. Portal vein is patent on color Doppler imaging with normal direction of blood flow towards the liver. Other: None. IMPRESSION: 1. Mild central intrahepatic biliary dilatation without common bile duct dilatation. Consider further evaluation with ERCP or MRCP. Electronically Signed   By: Titus Dubin M.D.   On: 12/04/2020 11:11    PERFORMANCE STATUS (ECOG) : 1 - Symptomatic but completely ambulatory  Review of Systems  Musculoskeletal:  Positive for arthralgias.  Neurological:  Positive for weakness.  Unless otherwise noted, a complete review of systems is negative.  Physical Exam General: NAD Cardiovascular: regular rate and rhythm Pulmonary: clear ant fields Abdomen: soft, nontender, + bowel sounds Extremities: no edema, no joint deformities Skin: no rashes, warm, dry  Neurological: AAO x3, mood appropriate  IMPRESSION:  Mr. Picou presents to the clinic alone today. He was recently discharged from the hospital on 12/10/2020 where he received treatment for back pain, abdominal pain, cancer related pain, and abnormal LFTs which improved. He was seen by my Palliative colleague for symptom management.   I introduced myself to Mr. Heidecker and Palliative's role in collaboration with his Oncology team. Mr. Junkins is a patient of Dr. Marin Olp and Judson Roch, NP at [place] location. He understands I do not see patient's in [place] at this time, but would like ongoing support and would like to continue with ongoing support here at [place].   He is able to perform most ADLs in the home with some noticeable fatigue and ongoing pain. Reports appetite is fair (depends on the day and how he is feeling). Uses a  can for ambulatory stability. He is able to drive and run errands. Complains of ongoing constipation. He has not had a bowel movement since 11/14.   We discussed at length current pain regimen. He is taking Oxycodone 30 mg as needed for breakthrough pain, dexamethasone 2mg , and Fentanyl patches totaling 275 mcg. He does have the patches on. He reports pain is effectively managed. We discussed no adjustments at this time. He is functional with no signs of lethargy.   Education provided on bowel regimen. He is taking Miralax twice daily in addition to Senna however is still not having bowel movements regularly. We discussed at length increased fiber intake and water intake as well as opioid induced constipation. Given he has not had a bowel movement despite adhering to regimen education provided on the use of Movantik. Raiden verbalized understanding.   I approach discussion empathetically allowing him to discuss his thoughts and feelings regarding his overall health and cancer diagnosis. We spent time talking about his strong Christian faith and belief. Muneeb is looking forward to some family time and good food during the upcoming holidays.   All questions answered and support provided.  PLAN: Continue Oxycodone as needed for breakthrough pain Fentanyl 275 mcg/hr. He was only provided with 3 patches at discharge. Will send refill to his pharmacy for a month supply.  Continue Miralax twice daily. Will take Senna as needed Movantik 12.5 mg daily. We will increase as needed.  I will plan to see him back in the clinic in 2-3 weeks.    Patient expressed understanding and was in agreement with this plan. He also understands that He can call the clinic at any time with any questions, concerns, or complaints.     Time Total: 50 min.   Visit consisted of counseling and education dealing with the complex and emotionally intense issues of symptom management and palliative care in the setting of serious  and potentially life-threatening illness.Greater than 50%  of this time was spent counseling and coordinating care related to the above assessment and plan.  Signed by: Alda Lea, AGPCNP-BC Palliative Medicine Team

## 2020-12-14 ENCOUNTER — Ambulatory Visit: Payer: Medicare Other

## 2020-12-14 ENCOUNTER — Encounter: Payer: Self-pay | Admitting: Hematology & Oncology

## 2020-12-14 ENCOUNTER — Inpatient Hospital Stay: Payer: Medicare Other

## 2020-12-14 ENCOUNTER — Inpatient Hospital Stay (HOSPITAL_BASED_OUTPATIENT_CLINIC_OR_DEPARTMENT_OTHER): Payer: Medicare Other | Admitting: Family

## 2020-12-14 ENCOUNTER — Encounter: Payer: Self-pay | Admitting: Family

## 2020-12-14 ENCOUNTER — Other Ambulatory Visit: Payer: Self-pay

## 2020-12-14 VITALS — BP 109/40 | HR 82 | Temp 99.1°F | Resp 17 | Ht 74.0 in | Wt 145.4 lb

## 2020-12-14 DIAGNOSIS — C61 Malignant neoplasm of prostate: Secondary | ICD-10-CM

## 2020-12-14 LAB — CMP (CANCER CENTER ONLY)
ALT: 30 U/L (ref 0–44)
AST: 14 U/L — ABNORMAL LOW (ref 15–41)
Albumin: 4.1 g/dL (ref 3.5–5.0)
Alkaline Phosphatase: 750 U/L — ABNORMAL HIGH (ref 38–126)
Anion gap: 8 (ref 5–15)
BUN: 7 mg/dL (ref 6–20)
CO2: 25 mmol/L (ref 22–32)
Calcium: 8.5 mg/dL — ABNORMAL LOW (ref 8.9–10.3)
Chloride: 103 mmol/L (ref 98–111)
Creatinine: 0.61 mg/dL (ref 0.61–1.24)
GFR, Estimated: >60 mL/min (ref >60)
Glucose, Bld: 134 mg/dL — ABNORMAL HIGH (ref 70–99)
Potassium: 3.2 mmol/L — ABNORMAL LOW (ref 3.5–5.1)
Sodium: 136 mmol/L (ref 135–145)
Total Bilirubin: 0.4 mg/dL (ref 0.3–1.2)
Total Protein: 6.2 g/dL — ABNORMAL LOW (ref 6.5–8.1)

## 2020-12-14 LAB — CBC WITH DIFFERENTIAL (CANCER CENTER ONLY)
Abs Immature Granulocytes: 0.32 10*3/uL — ABNORMAL HIGH (ref 0.00–0.07)
Basophils Absolute: 0 10*3/uL (ref 0.0–0.1)
Basophils Relative: 1 %
Eosinophils Absolute: 0.1 10*3/uL (ref 0.0–0.5)
Eosinophils Relative: 2 %
HCT: 33.3 % — ABNORMAL LOW (ref 39.0–52.0)
Hemoglobin: 11.1 g/dL — ABNORMAL LOW (ref 13.0–17.0)
Immature Granulocytes: 4 %
Lymphocytes Relative: 32 %
Lymphs Abs: 2.5 10*3/uL (ref 0.7–4.0)
MCH: 30.3 pg (ref 26.0–34.0)
MCHC: 33.3 g/dL (ref 30.0–36.0)
MCV: 91 fL (ref 80.0–100.0)
Monocytes Absolute: 0.6 10*3/uL (ref 0.1–1.0)
Monocytes Relative: 8 %
Neutro Abs: 4.2 10*3/uL (ref 1.7–7.7)
Neutrophils Relative %: 53 %
Platelet Count: 158 10*3/uL (ref 150–400)
RBC: 3.66 MIL/uL — ABNORMAL LOW (ref 4.22–5.81)
RDW: 17.4 % — ABNORMAL HIGH (ref 11.5–15.5)
WBC Count: 7.9 10*3/uL (ref 4.0–10.5)
nRBC: 0 % (ref 0.0–0.2)

## 2020-12-14 MED ORDER — SODIUM CHLORIDE 0.9% FLUSH
10.0000 mL | Freq: Once | INTRAVENOUS | Status: AC
  Administered 2020-12-14: 10 mL via INTRAVENOUS

## 2020-12-14 MED ORDER — HEPARIN SOD (PORK) LOCK FLUSH 100 UNIT/ML IV SOLN
500.0000 [IU] | Freq: Once | INTRAVENOUS | Status: AC
  Administered 2020-12-14: 500 [IU] via INTRAVENOUS

## 2020-12-14 NOTE — Progress Notes (Signed)
Hematology and Oncology Follow Up Visit  Ryan Davis [id number] [birth year] 60 y.o. 12/14/2020   Principle Diagnosis:  Metastatic prostate cancer - androgen resistant    Past Therapy: 14 fractions of radiation to the lumbar spine Casodex 50 mg by mouth daily - stopped 04/02/2017 due to progression  Zytiga 1000 mg by mouth daily/prednisone 10 mg by mouth daily - started on 04/20/2016 - d/c on 04/02/2017 Xtandi 160 mg po q day - started on 04/06/2017 -- d/c on 06/24/2017 Flutamide 250 mg po q8hr - started 07/08/2017 -- d/c on 04/23/18 Taxotere --  s/p cycle #3--  start on 09/05/2020 -- d/c on 11/12/2020 due to progression Apalutamide 240 mg po q day -- started on 04/28/2018- DC'd   Current Therapy:        Lupron 11.5 mg IM every 3 month - next dose 09/2020  Zometa 4 mg IV q 3 months - next dose 09/2020 Pluvicto -- Start on 11/2020   Interim History:  [name] is here today for follow-up and treatment. He is doing fairly well and states that his current pain medication regimen is effective. He has some break through lower back pain at times and takes his oxycodone as needed. He is wearing his Fentanyl patches as directed.  He was able to see [name], NP with palliative care yesterday and will follow-up again in a few weeks.  I has reached out to schedule his Pluvicto and he will call them back to schedule on Monday as they are closed today.  No fever, chills, n/v, cough, rash, SOB, chest pain, palpitations, abdominal pain or changes in bowel habits He had 1 episode of urinary retention 3 weeks ago but has had no other issues since.  No blood loss noted. No bruising or petechiae.  No swelling in his extremities at this time.  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  No falls or syncope reported. He ambulates with his cane for added support.  He states that he has a good appetite right now and is doing his best to stay well hydrated. His weight is stable at 145 lbs.   ECOG  Performance Status: 1 - Symptomatic but completely ambulatory  Medications:  Allergies as of 12/14/2020       Reactions   Contrast Media [iodinated Diagnostic Agents] Nausea And Vomiting   Patient vomits immediately after IV contrast is injected just prior to start of scan, was unable to scan patient in correct time frame.  Patient did not warn me of this when dosing the patient with oral CM and asking the patient history questions until the patient was on the table for the CT scan just prior to injection.  Ct Tech:  Karl Bales 07/29/17    Other Nausea And Vomiting   Patient vomits immediately after IV contrast is injected just prior to start of scan, was unable to scan patient in correct time frame.  Patient did not warn me of this when dosing the patient with oral CM and asking the patient history questions until the patient was on the table for the CT scan just prior to injection.  Ct Tech:  Karl Bales 07/29/17         Medication List        Accurate as of December 14, 2020  9:17 AM. If you have any questions, ask your nurse or doctor.          STOP taking these medications    cyclobenzaprine 10 MG tablet  Commonly known as: FLEXERIL Stopped by: Lottie Dawson, NP   dexamethasone 2 MG tablet Commonly known as: DECADRON Stopped by: Lottie Dawson, NP       TAKE these medications    famotidine 40 MG tablet Commonly known as: PEPCID Take 1 tablet (40 mg total) by mouth 2 (two) times daily.   fentaNYL 100 MCG/HR Commonly known as: [place] 2 patches onto the skin every other day.   fentaNYL 75 MCG/HR Commonly known as: [place] 1 patch onto the skin every other day.   gabapentin 800 MG tablet Commonly known as: NEURONTIN Take 800 mg by mouth 3 (three) times daily. May take an additional 400mg  as needed for pain   metoprolol tartrate 25 MG tablet Commonly known as: LOPRESSOR TAKE 1/2 TABLET(12.5 MG) BY MOUTH TWICE DAILY What changed: See the new  instructions.   naloxegol oxalate 12.5 MG Tabs tablet Commonly known as: MOVANTIK Take 1 tablet (12.5 mg total) by mouth daily.   Naloxone HCl 0.4 MG/0.4ML Soaj Use in the event of accidental overdose of opioids.   oxycodone 30 MG immediate release tablet Commonly known as: ROXICODONE Take 1 tablet (30 mg total) by mouth every 3 (three) hours as needed for up to 7 days for moderate pain.   pantoprazole 40 MG tablet Commonly known as: Protonix Take 1 tablet (40 mg total) by mouth daily.   polyethylene glycol 17 g packet Commonly known as: MIRALAX / GLYCOLAX Take 17 g by mouth 2 (two) times daily.   senna 8.6 MG Tabs tablet Commonly known as: SENOKOT Take 2 tablets (17.2 mg total) by mouth 2 (two) times daily.   tamsulosin 0.4 MG Caps capsule Commonly known as: FLOMAX Take 1 capsule (0.4 mg total) by mouth daily.   Trulance 3 MG Tabs Generic drug: Plecanatide Take by mouth.        Allergies:  Allergies  Allergen Reactions   Contrast Media [Iodinated Diagnostic Agents] Nausea And Vomiting    Patient vomits immediately after IV contrast is injected just prior to start of scan, was unable to scan patient in correct time frame.  Patient did not warn me of this when dosing the patient with oral CM and asking the patient history questions until the patient was on the table for the CT scan just prior to injection.  Ct Tech:  Karl Bales 07/29/17    Other Nausea And Vomiting    Patient vomits immediately after IV contrast is injected just prior to start of scan, was unable to scan patient in correct time frame.  Patient did not warn me of this when dosing the patient with oral CM and asking the patient history questions until the patient was on the table for the CT scan just prior to injection.  Ct Tech:  Karl Bales 07/29/17     Past Medical History, Surgical history, Social history, and Family History were reviewed and updated.  Review of Systems: All other 10 point review  of systems is negative.   Physical Exam:  height is 6\' 2"  (1.88 m) and weight is 145 lb 6.4 oz (66 kg). His oral temperature is 99.1 F (37.3 C). His blood pressure is 109/40 (abnormal) and his pulse is 82. His respiration is 17 and oxygen saturation is 99%.   Wt Readings from Last 3 Encounters:  12/14/20 145 lb 6.4 oz (66 kg)  12/13/20 145 lb 14.4 oz (66.2 kg)  12/07/20 162 lb 0.6 oz (73.5 kg)    Ocular: Sclerae unicteric, pupils  equal, round and reactive to light Ear-nose-throat: Oropharynx clear, dentition fair Lymphatic: No cervical or supraclavicular adenopathy Lungs no rales or rhonchi, good excursion bilaterally Heart regular rate and rhythm, no murmur appreciated Abd soft, nontender, positive bowel sounds MSK no focal spinal tenderness, no joint edema Neuro: non-focal, well-oriented, appropriate affect Breasts: Deferred   Lab Results  Component Value Date   WBC 7.9 12/14/2020   HGB 11.1 (L) 12/14/2020   HCT 33.3 (L) 12/14/2020   MCV 91.0 12/14/2020   PLT 158 12/14/2020   Lab Results  Component Value Date   FERRITIN 216 11/04/2017   IRON 77 11/04/2017   TIBC 261 11/04/2017   UIBC 184 11/04/2017   IRONPCTSAT 30 (L) 11/04/2017   Lab Results  Component Value Date   RBC 3.66 (L) 12/14/2020   Lab Results  Component Value Date   KAPLAMBRATIO 0.89 03/28/2016   Lab Results  Component Value Date   IGGSERUM 1,066 03/28/2016   IGMSERUM 106 03/28/2016   No results found for: Odetta Pink, SPEI   Chemistry      Component Value Date/Time   NA 136 12/14/2020 0842   NA 143 12/16/2016 1022   K 3.2 (L) 12/14/2020 0842   K 3.5 12/16/2016 1022   CL 103 12/14/2020 0842   CL 101 12/16/2016 1022   CO2 25 12/14/2020 0842   CO2 33 12/16/2016 1022   BUN 7 12/14/2020 0842   BUN 4 (L) 12/16/2016 1022   CREATININE 0.61 12/14/2020 0842   CREATININE 0.8 12/16/2016 1022      Component Value Date/Time   CALCIUM 8.5 (L)  12/14/2020 0842   CALCIUM 9.4 12/16/2016 1022   ALKPHOS 750 (H) 12/14/2020 0842   ALKPHOS 77 12/16/2016 1022   AST 14 (L) 12/14/2020 0842   ALT 30 12/14/2020 0842   ALT 16 12/16/2016 1022   BILITOT 0.4 12/14/2020 0842       Impression and Plan: Mr. Aument is a very pleasant 60 yo African American gentleman with metastatic prostate cancer, progressive. His PSA last month was 142. Today's result is pending.  Zometa held due to low calcium.  Lupron due again next month.  He states that he will call IR on Monday to schedule Pluvicto.  Patient is doing well on current pain medication regimen.  Lab reviewed with MD. No intervention needed at this time.  Patient does not feel that he needs fluids this visit.  Follow-up in 4 weeks.  He will contact our office with any questions or concerns and go to the ED in the event of an emergency.   Lottie Dawson, NP 11/18/20229:17 AM

## 2020-12-14 NOTE — Addendum Note (Signed)
Addended by: Shelda Altes on: 12/14/2020 10:20 AM   Modules accepted: Orders

## 2020-12-15 ENCOUNTER — Other Ambulatory Visit: Payer: Self-pay

## 2020-12-15 ENCOUNTER — Encounter (HOSPITAL_COMMUNITY): Payer: Self-pay | Admitting: Emergency Medicine

## 2020-12-15 ENCOUNTER — Emergency Department (HOSPITAL_COMMUNITY)
Admission: EM | Admit: 2020-12-15 | Discharge: 2020-12-15 | Disposition: A | Discharge status: Home / Self Care | Payer: Medicare Other | Attending: Emergency Medicine | Admitting: Emergency Medicine

## 2020-12-15 DIAGNOSIS — Z79899 Other long term (current) drug therapy: Secondary | ICD-10-CM | POA: Diagnosis not present

## 2020-12-15 DIAGNOSIS — R1084 Generalized abdominal pain: Secondary | ICD-10-CM | POA: Diagnosis present

## 2020-12-15 DIAGNOSIS — M545 Low back pain, unspecified: Secondary | ICD-10-CM | POA: Insufficient documentation

## 2020-12-15 DIAGNOSIS — G8929 Other chronic pain: Secondary | ICD-10-CM | POA: Diagnosis not present

## 2020-12-15 DIAGNOSIS — Z87891 Personal history of nicotine dependence: Secondary | ICD-10-CM | POA: Insufficient documentation

## 2020-12-15 DIAGNOSIS — I4891 Unspecified atrial fibrillation: Secondary | ICD-10-CM | POA: Insufficient documentation

## 2020-12-15 LAB — CBC WITH DIFFERENTIAL/PLATELET
Abs Immature Granulocytes: 0.76 10*3/uL — ABNORMAL HIGH (ref 0.00–0.07)
Basophils Absolute: 0.1 10*3/uL (ref 0.0–0.1)
Basophils Relative: 0 %
Eosinophils Absolute: 0 10*3/uL (ref 0.0–0.5)
Eosinophils Relative: 0 %
HCT: 34.6 % — ABNORMAL LOW (ref 39.0–52.0)
Hemoglobin: 11.7 g/dL — ABNORMAL LOW (ref 13.0–17.0)
Immature Granulocytes: 6 %
Lymphocytes Relative: 16 %
Lymphs Abs: 2 10*3/uL (ref 0.7–4.0)
MCH: 29.8 pg (ref 26.0–34.0)
MCHC: 33.8 g/dL (ref 30.0–36.0)
MCV: 88.3 fL (ref 80.0–100.0)
Monocytes Absolute: 0.8 10*3/uL (ref 0.1–1.0)
Monocytes Relative: 6 %
Neutro Abs: 8.7 10*3/uL — ABNORMAL HIGH (ref 1.7–7.7)
Neutrophils Relative %: 72 %
Platelets: 168 10*3/uL (ref 150–400)
RBC: 3.92 MIL/uL — ABNORMAL LOW (ref 4.22–5.81)
RDW: 17.4 % — ABNORMAL HIGH (ref 11.5–15.5)
WBC: 12.3 10*3/uL — ABNORMAL HIGH (ref 4.0–10.5)
nRBC: 0.3 % — ABNORMAL HIGH (ref 0.0–0.2)

## 2020-12-15 LAB — COMPREHENSIVE METABOLIC PANEL
ALT: 30 U/L (ref 0–44)
AST: 18 U/L (ref 15–41)
Albumin: 4.5 g/dL (ref 3.5–5.0)
Alkaline Phosphatase: 837 U/L — ABNORMAL HIGH (ref 38–126)
Anion gap: 11 (ref 5–15)
BUN: 6 mg/dL (ref 6–20)
CO2: 22 mmol/L (ref 22–32)
Calcium: 8.8 mg/dL — ABNORMAL LOW (ref 8.9–10.3)
Chloride: 104 mmol/L (ref 98–111)
Creatinine, Ser: 0.59 mg/dL — ABNORMAL LOW (ref 0.61–1.24)
GFR, Estimated: >60 mL/min (ref >60)
Glucose, Bld: 131 mg/dL — ABNORMAL HIGH (ref 70–99)
Potassium: 3.4 mmol/L — ABNORMAL LOW (ref 3.5–5.1)
Sodium: 137 mmol/L (ref 135–145)
Total Bilirubin: 0.8 mg/dL (ref 0.3–1.2)
Total Protein: 7.6 g/dL (ref 6.5–8.1)

## 2020-12-15 LAB — PSA, TOTAL AND FREE
PSA, Free Pct: 19.8 %
PSA, Free: 33.3 ng/mL
Prostate Specific Ag, Serum: 168 ng/mL — ABNORMAL HIGH (ref 0.0–4.0)

## 2020-12-15 LAB — URINALYSIS, ROUTINE W REFLEX MICROSCOPIC
Bilirubin Urine: NEGATIVE
Glucose, UA: NEGATIVE mg/dL
Hgb urine dipstick: NEGATIVE
Ketones, ur: NEGATIVE mg/dL
Leukocytes,Ua: NEGATIVE
Nitrite: NEGATIVE
Protein, ur: NEGATIVE mg/dL
Specific Gravity, Urine: 1.01 (ref 1.005–1.030)
pH: 5 (ref 5.0–8.0)

## 2020-12-15 LAB — LIPASE, BLOOD: Lipase: 22 U/L (ref 11–51)

## 2020-12-15 LAB — TESTOSTERONE: Testosterone: <3 ng/dL — ABNORMAL LOW (ref 264–916)

## 2020-12-15 MED ORDER — HYDROMORPHONE HCL 1 MG/ML IJ SOLN
1.0000 mg | Freq: Once | INTRAMUSCULAR | Status: AC
  Administered 2020-12-15: 1 mg via INTRAVENOUS
  Filled 2020-12-15: qty 1

## 2020-12-15 MED ORDER — ALUM & MAG HYDROXIDE-SIMETH 200-200-20 MG/5ML PO SUSP
30.0000 mL | Freq: Once | ORAL | Status: AC
  Administered 2020-12-15: 30 mL via ORAL
  Filled 2020-12-15: qty 30

## 2020-12-15 MED ORDER — LIDOCAINE VISCOUS HCL 2 % MT SOLN
15.0000 mL | Freq: Once | OROMUCOSAL | Status: AC
  Administered 2020-12-15: 15 mL via ORAL
  Filled 2020-12-15: qty 15

## 2020-12-15 MED ORDER — METOPROLOL TARTRATE 25 MG PO TABS
12.5000 mg | ORAL_TABLET | Freq: Once | ORAL | Status: AC
  Administered 2020-12-15: 12.5 mg via ORAL
  Filled 2020-12-15: qty 1

## 2020-12-15 MED ORDER — SODIUM CHLORIDE 0.9 % IV BOLUS
1000.0000 mL | Freq: Once | INTRAVENOUS | Status: AC
  Administered 2020-12-15: 1000 mL via INTRAVENOUS

## 2020-12-15 MED ORDER — HYDROMORPHONE HCL 1 MG/ML IJ SOLN
0.5000 mg | Freq: Once | INTRAMUSCULAR | Status: AC
  Administered 2020-12-15: 0.5 mg via INTRAVENOUS
  Filled 2020-12-15: qty 1

## 2020-12-15 MED ORDER — FAMOTIDINE IN NACL 20-0.9 MG/50ML-% IV SOLN
20.0000 mg | Freq: Once | INTRAVENOUS | Status: AC
  Administered 2020-12-15: 20 mg via INTRAVENOUS
  Filled 2020-12-15: qty 50

## 2020-12-15 NOTE — ED Triage Notes (Signed)
Pt reports central abdominal pain and back pain since 1830 today. Also endorses n/v. Denies fevers.

## 2020-12-15 NOTE — ED Provider Notes (Signed)
10:31 AM Patient seen in conjunction with Blue PA-C.  Patient unfortunately has extensive metastatic disease from prostate cancer.  He was recently admitted for severe abdominal pain and had a very thorough work-up inpatient.  He presents today with recurrent abdominal pain and spinal pain.  His pain regimen currently consists of fentanyl patch and oxycodone 30 mg tablets for breakthrough pain.  Lab work today with no acute findings.  On exam, abdomen is diffusely tender.  No obvious lower extremity weakness.  Patient has received multiple doses of pain medication here to try to help control symptoms.  No acute indications for admission at this point.  He will need to continue to work with his outpatient doctors on pain management.  BP (!) 184/100   Pulse (!) 110   Temp 98 F (36.7 C) (Oral)   Resp 19   Ht 6\' 2"  (1.88 m)   Wt 65.8 kg   SpO2 95%   BMI 18.62 kg/m     Carlisle Cater, PA-C 12/15/20 1033    Horton, Alvin Critchley, DO 12/15/20 1153

## 2020-12-15 NOTE — ED Provider Notes (Signed)
[facility] DEPT Provider Note   CSN: [id number] Arrival date & time: 12/15/20  0204     History Chief Complaint  Patient presents with   Abdominal Pain   Back Pain    Ryan Davis is a 60 y.o. male with a past medical history of metastatic prostate cancer, who presents to the emergency department complaining of central abdominal pain onset 2 hours ago prior to arrival.  Patient reports that his abdominal pain started after his oncologist visit or yesterday.  He has associated lower back pain, nausea, and vomiting.  Patient has tried Rx medications with no relief of his symptoms.  He denies constipation, diarrhea, fevers, chills, shortness of breath, chest pain, dysuria, hematuria, dizziness, or lightheadedness.    Per patient chart review: Patient was admitted to the hospital 11/8-11/14 due to intractable abdominal pain.  While admitted to the hospital patient had MRCP ordered that showed mild intrahepatic ductal dilatation with delayed peribiliary enhancement.  With GI consult.  Patient was seen by his oncologist on yesterday.  Patient has prescriptions for fentanyl patches and oxycodone.    The history is provided by the patient. No language interpreter was used.  Abdominal Pain Pain location:  Generalized Pain radiates to:  Does not radiate Pain severity:  Moderate Progression:  Unchanged Associated symptoms: nausea   Associated symptoms: no chest pain, no chills, no cough, no fever, no shortness of breath and no vomiting       Past Medical History:  Diagnosis Date   A-fib [facility])    see 03-28-16 admission   Back pain without radiculopathy 03/28/2016   Dyspnea    endorses since treatment radiation , still gets SOB occasionally    Dysrhythmia    afib    Goals of care, counseling/discussion 06/24/2017   History of radiation therapy 09/16/16-10/06/16   35 Gy in 14 fractions to the lumbar spine   Nocturia    Prostate cancer metastatic to bone  ([place]) 03/31/2016   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[place]) 03/31/2016    Patient Active Problem List   Diagnosis Date Noted   Intractable abdominal pain 12/04/2020   Hypokalemia due to excessive gastrointestinal loss of potassium 12/04/2020   Dilation of biliary tract 12/04/2020   Abnormal LFTs 12/04/2020   GERD without esophagitis 12/04/2020   Abdominal pain 10/18/2020   Chronic pain syndrome 12/02/2017   Goals of care, counseling/discussion 06/24/2017   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[place]) 03/31/2016   Prostate cancer metastatic to bone ([place]) 03/31/2016   AF (paroxysmal atrial fibrillation) ([place]) 03/30/2016   Back pain without radiculopathy 03/28/2016   Atrial fibrillation with RVR ([place]) 03/28/2016    Past Surgical History:  Procedure Laterality Date   IR IMAGING GUIDED PORT INSERTION  09/05/2020   MULTIPLE EXTRACTIONS WITH ALVEOLOPLASTY N/A 11/19/2016   Procedure: EXTRACTION OF TOOTH #'S 1,3,5-7,9-17, AND 20- 29 WITH ALVEOLOPLASTY, BILATERAL MANDIBULAR TORI REDUCTIONS AND BILATERAL MAXILLARY BUCCAL EXOSTOSES REDUCTIONS;  Surgeon: Lenn Cal, DDS;  Location: [facility] ORS;  Service: Oral Surgery;  Laterality: N/A;   NO PAST SURGERIES         Family History  Problem Relation Age of Onset   Stroke Sister    Lupus Mother    Cancer Neg Hx    Heart disease Neg Hx    Diabetes Neg Hx     Social History   Tobacco Use   Smoking status: Former    Packs/day: 0.50    Years: 39.00  Pack years: 19.50    Types: Cigarettes    Quit date: 04/30/2020    Years since quitting: 0.6   Smokeless tobacco: Never  Vaping Use   Vaping Use: Never used  Substance Use Topics   Alcohol use: Not Currently   Drug use: No    Home Medications Prior to Admission medications   Medication Sig Start Date End Date Taking? Authorizing Provider  famotidine (PEPCID) 40 MG tablet Take 1 tablet (40 mg total) by mouth 2 (two) times daily. 09/27/20   [name], MD  fentaNYL  (DURAGESIC) 100 MCG/HR Place 2 patches onto the skin every other day. 12/10/20   Caren Griffins, MD  fentaNYL (DURAGESIC) 75 MCG/HR Place 1 patch onto the skin every other day. 12/13/20   Pickenpack-Cousar, Carlena Sax, NP  gabapentin (NEURONTIN) 800 MG tablet Take 800 mg by mouth 3 (three) times daily. May take an additional 400mg  as needed for pain    [provider]  metoprolol tartrate (LOPRESSOR) 25 MG tablet TAKE 1/2 TABLET(12.5 MG) BY MOUTH TWICE DAILY Patient taking differently: Take 12.5 mg by mouth 2 (two) times daily. 09/24/20   [name], MD  naloxegol oxalate (MOVANTIK) 12.5 MG TABS tablet Take 1 tablet (12.5 mg total) by mouth daily. Patient not taking: Reported on 12/13/2020 12/13/20   Pickenpack-Cousar, Carlena Sax, NP  Naloxone HCl 0.4 MG/0.4ML SOAJ Use in the event of accidental overdose of opioids. Patient not taking: Reported on 12/14/2020 08/26/20   Arnaldo Natal, MD  oxyCODONE (ROXICODONE) 30 MG immediate release tablet Take 1 tablet (30 mg total) by mouth every 3 (three) hours as needed for up to 7 days for moderate pain. 12/10/20 12/17/20  Caren Griffins, MD  pantoprazole (PROTONIX) 40 MG tablet Take 1 tablet (40 mg total) by mouth daily. 10/21/20 10/21/21  Shelly Coss, MD  Plecanatide (TRULANCE) 3 MG TABS Take by mouth. 10/24/20   [provider]  polyethylene glycol (MIRALAX / GLYCOLAX) 17 g packet Take 17 g by mouth 2 (two) times daily. 10/21/20   Shelly Coss, MD  senna (SENOKOT) 8.6 MG TABS tablet Take 2 tablets (17.2 mg total) by mouth 2 (two) times daily. 10/21/20   Shelly Coss, MD  tamsulosin (FLOMAX) 0.4 MG CAPS capsule Take 1 capsule (0.4 mg total) by mouth daily. 10/21/20   Shelly Coss, MD  prochlorperazine (COMPAZINE) 10 MG tablet Take 1 tablet (10 mg total) by mouth every 6 (six) hours as needed (Nausea or vomiting). Patient not taking: Reported on 11/12/2020 09/06/20 11/12/20  [name], MD    Allergies    Contrast media  [iodinated diagnostic agents] and Other  Review of Systems   Review of Systems  Constitutional:  Negative for chills and fever.  Respiratory:  Negative for cough and shortness of breath.   Cardiovascular:  Negative for chest pain.  Gastrointestinal:  Positive for abdominal pain and nausea. Negative for vomiting.  Musculoskeletal:  Positive for back pain.  Skin:  Negative for rash.  Neurological:  Negative for dizziness, syncope, light-headedness and headaches.  All other systems reviewed and are negative.  Physical Exam Updated Vital Signs BP (!) 166/97 (BP Location: Left Arm)   Pulse 91   Temp 98 F (36.7 C) (Oral)   Resp 20   Ht 6\' 2"  (1.88 m)   Wt 65.8 kg   SpO2 100%   BMI 18.62 kg/m   Physical Exam Vitals and nursing note reviewed.  Constitutional:      General:  He is not in acute distress.    Appearance: He is not diaphoretic.     Comments: Uncomfortable appearing.  HENT:     Head: Normocephalic and atraumatic.     Mouth/Throat:     Pharynx: No oropharyngeal exudate.  Eyes:     General: No scleral icterus.    Conjunctiva/sclera: Conjunctivae normal.  Cardiovascular:     Rate and Rhythm: Normal rate and regular rhythm.     Pulses: Normal pulses.     Heart sounds: Normal heart sounds.  Pulmonary:     Effort: Pulmonary effort is normal. No respiratory distress.     Breath sounds: Normal breath sounds. No wheezing.  Abdominal:     General: Bowel sounds are normal.     Palpations: Abdomen is soft. There is no mass.     Tenderness: There is generalized abdominal tenderness. There is no guarding or rebound.     Comments: Generalized tenderness to palpation.  Musculoskeletal:        General: Normal range of motion.     Cervical back: Normal range of motion and neck supple.     Comments: No spinal tenderness to palpation.  Full active range of motion of back.  Moves all extremities x4.  Strength and sensation intact to bilateral upper and lower extremities.  Skin:     General: Skin is warm and dry.     Comments: Fentanyl patch in place to right upper extremity.  Neurological:     Mental Status: He is alert.     Sensory: Sensation is intact.     Motor: Motor function is intact.  Psychiatric:        Behavior: Behavior normal.    ED Results / Procedures / Treatments   Labs (all labs ordered are listed, but only abnormal results are displayed) Labs Reviewed  COMPREHENSIVE METABOLIC PANEL - Abnormal; Notable for the following components:      Result Value   Potassium 3.4 (*)    Glucose, Bld 131 (*)    Creatinine, Ser 0.59 (*)    Calcium 8.8 (*)    Alkaline Phosphatase 837 (*)    All other components within normal limits  CBC WITH DIFFERENTIAL/PLATELET - Abnormal; Notable for the following components:   WBC 12.3 (*)    RBC 3.92 (*)    Hemoglobin 11.7 (*)    HCT 34.6 (*)    RDW 17.4 (*)    nRBC 0.3 (*)    Neutro Abs 8.7 (*)    Abs Immature Granulocytes 0.76 (*)    All other components within normal limits  LIPASE, BLOOD  URINALYSIS, ROUTINE W REFLEX MICROSCOPIC    EKG None  Radiology No results found.  Procedures Procedures   Medications Ordered in ED Medications  HYDROmorphone (DILAUDID) injection 0.5 mg (has no administration in time range)  HYDROmorphone (DILAUDID) injection 1 mg (1 mg Intravenous Given 12/15/20 0818)  sodium chloride 0.9 % bolus 1,000 mL (1,000 mLs Intravenous New Bag/Given 12/15/20 0823)  alum & mag hydroxide-simeth (MAALOX/MYLANTA) 200-200-20 MG/5ML suspension 30 mL (30 mLs Oral Given 12/15/20 0818)    And  lidocaine (XYLOCAINE) 2 % viscous mouth solution 15 mL (15 mLs Oral Given 12/15/20 0818)  famotidine (PEPCID) IVPB 20 mg premix (0 mg Intravenous Stopped 12/15/20 0922)  HYDROmorphone (DILAUDID) injection 1 mg (1 mg Intravenous Given 12/15/20 0916)  metoprolol tartrate (LOPRESSOR) tablet 12.5 mg (12.5 mg Oral Given 12/15/20 0946)    ED Course  I have reviewed the triage vital signs and  the nursing  notes.  Pertinent labs & imaging results that were available during my care of the patient were reviewed by me and considered in my medical decision making (see chart for details).  Clinical Course as of 12/15/20 1116  Sat Dec 15, 2020  0800 Patient uncomfortable appearing on stretcher and squirming on stretcher.  [SB]  0848 Patient reevaluated following GI cocktail, IVF, pepcid, and dilaudid. Pt noted to be resting comfortably on his stomach on the stretcher. However, voicing continued pain. [SB]  [id number] Reevaluated, patient resting comfortably on his side on the stretcher.  Due to increased blood pressure, will order home metoprolol.  Informed patient, patient acknowledges and verbalizes understanding. [SB]  7169 [id number], patient again resting comfortably on her side on the stretcher.  Blood pressure reevaluated in the room.  Improved vital signs, heart rate 83, oxygen saturation 100%, blood pressure decreased to 160/88. [SB]  0258 Discussed discharge treatment plan with patient, will provide 1 more dose of Dilaudid in the ED.  Advised patient to call his son for a ride home.  Patient to call his oncologist in regards to today's visit and can continue taking his prescription fentanyl and oxycodone as prescribed.  Patient acknowledges and voices understanding.  Patient appears safe for discharge at this time. [SB]    Clinical Course User Index [SB] Lawrnce Reyez A, PA-C   MDM Rules/Calculators/A&P                          Patient presents to the ED with generalized abdominal pain and back pain since yesterday.  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 Patient current symptoms are similar to when he was admitted to the hospital from 11/8-11/14.  Patient evaluated by his oncologist for follow-up on yesterday with his pain starting following.  Patient at home prescription pain regimen notable for fentanyl patch and oxycodone 30 mg tablets for  breakthrough pain. On exam patient uncomfortable appearing with diffuse abdominal tenderness to palpation.  No spinal tenderness to palpation.  Patient able to ambulate without assistance or difficulty. Fentanyl patch in place to right upper arm. Differential diagnosis includes infection or exacerbation of chronic pain due to malignancy.  CBC with WBC elevated to 12.3, this was increased from 7.9 on 12/14/2020.  Vital signs stable, patient afebrile.  Hemoglobin improved to 11.7.  Urinalysis negative for infection.  Lipase unremarkable.  CMP showed potassium improved to 3.4, alkaline phosphatase slightly increased from yesterday's findings, otherwise unremarkable.  Patient does not meet SIRS criteria at this time.  Less likely infection as cause of pain. This is likely exacerbation of his metastatic cancer pain.   Patient given 1 mg Dilaudid in the ED.  Patient placed on telemetry for cardiac monitoring.  Given GI cocktail, Pepcid, IV fluids in the ED. After administration of Dilaudid, GI cocktail, Pepcid, and IV fluids, patient noted to be resting comfortably on his stomach on the stretcher.  However, patient reports continued pain.  Oxygen saturation noted to be at 100%. Will order additional 1 mg Dilaudid in the ED. Patient noted to be up and moving around room.  Patient reevaluated and resting comfortably on his side on stretcher. Multiple times throughout ED stay, patient noted to be taking off leads and moving around the room.  Patient received multiple doses of pain medication in the ED for uncontrolled symptoms.  At discharge patient given 0.5 mg Dilaudid. No indication for admission at this  time, labs without acute abnormalities, vital signs stable.  Patient with recent extensive imaging and lab work-up during previous admission.  Due to elevated blood pressures in the ED patient given home dose of 12.5 mg metoprolol in the ED.  Blood pressure downtrending and improved to 160/88.  Patient made aware to  take night dose of metoprolol, patient acknowledges and verbalizes understanding.  Discussed with patient importance of following up with oncologist.  Patient knowledges and verbalizes understanding.  Discussed with patient return precautions to the ED including worsening abdominal pain, uncontrollable vomiting, or decreased fluid intake.  Patient appears safe for discharge at this time.  Follow-up as indicated in discharge paperwork.   Final Clinical Impression(s) / ED Diagnoses Final diagnoses:  Generalized abdominal pain  Chronic back pain, unspecified back location, unspecified back pain laterality    Rx / DC Orders ED Discharge Orders     None        Aubryana Vittorio A, PA-C 12/15/20 1233    Lorelle Gibbs, DO 12/15/20 1535

## 2020-12-15 NOTE — Discharge Instructions (Addendum)
Call your oncologist in regards to today's visit.  Take your prescription pain medication as prescribed. You were given your morning dose of Metoprolol, make sure that you take your nighttime dose. You may use heat for up to 15 minutes at a time to the affected areas. Return to the ED if you are experiencing worsening abdominal pain, vomiting, or if you are unable to maintain fluid intake.

## 2020-12-18 ENCOUNTER — Telehealth: Payer: Self-pay

## 2020-12-18 NOTE — Telephone Encounter (Signed)
Ryan Davis called informing us that the pharmacy was not able to fill his prescription for his Fentanyl patch. I gave them a call and they said they had just received the prior authorization and would have the Fentanyl patches in tomorrow. I relayed this information to Ryan Davis. He also stated this feet were feeling very numb. I went over the instructions of his Gabepentin and emphasized that he can take 1/2 a tablet/400mg  additionally if there is continued pain. Understanding was verbalized. All questions were answered. I advised him to call back with any questions/concerns.

## 2020-12-18 NOTE — Telephone Encounter (Signed)
Notified Patient of Prior Authorization approval for Fentanyl Patch. Medication was approved through 01/26/2022 without the usual Morphine restrictions. Approved at this time for 30 patches with a days supply of 15. Pharmacy notified of authorization

## 2020-12-25 ENCOUNTER — Emergency Department (HOSPITAL_BASED_OUTPATIENT_CLINIC_OR_DEPARTMENT_OTHER)
Admission: EM | Admit: 2020-12-25 | Discharge: 2020-12-26 | Disposition: A | Discharge status: Home / Self Care | Payer: Medicare Other | Attending: Emergency Medicine | Admitting: Emergency Medicine

## 2020-12-25 ENCOUNTER — Encounter (HOSPITAL_BASED_OUTPATIENT_CLINIC_OR_DEPARTMENT_OTHER): Payer: Self-pay | Admitting: *Deleted

## 2020-12-25 ENCOUNTER — Other Ambulatory Visit: Payer: Self-pay

## 2020-12-25 DIAGNOSIS — R39198 Other difficulties with micturition: Secondary | ICD-10-CM | POA: Diagnosis present

## 2020-12-25 DIAGNOSIS — M546 Pain in thoracic spine: Secondary | ICD-10-CM | POA: Diagnosis not present

## 2020-12-25 DIAGNOSIS — Z85830 Personal history of malignant neoplasm of bone: Secondary | ICD-10-CM | POA: Diagnosis not present

## 2020-12-25 DIAGNOSIS — Z20822 Contact with and (suspected) exposure to covid-19: Secondary | ICD-10-CM | POA: Diagnosis not present

## 2020-12-25 DIAGNOSIS — Z8546 Personal history of malignant neoplasm of prostate: Secondary | ICD-10-CM | POA: Insufficient documentation

## 2020-12-25 DIAGNOSIS — Z87891 Personal history of nicotine dependence: Secondary | ICD-10-CM | POA: Diagnosis not present

## 2020-12-25 DIAGNOSIS — R339 Retention of urine, unspecified: Secondary | ICD-10-CM

## 2020-12-25 MED ORDER — HYDROMORPHONE HCL 1 MG/ML IJ SOLN
INTRAMUSCULAR | Status: AC
  Administered 2020-12-25: 0.5 mg via INTRAVENOUS
  Filled 2020-12-25: qty 1

## 2020-12-25 MED ORDER — HYDROMORPHONE HCL 1 MG/ML IJ SOLN: 0.5000 mg | Freq: Once | INTRAMUSCULAR | Status: AC

## 2020-12-25 MED ORDER — HYDROMORPHONE HCL 1 MG/ML IJ SOLN
1.0000 mg | Freq: Once | INTRAMUSCULAR | Status: AC
  Administered 2020-12-26: 1 mg via INTRAVENOUS
  Filled 2020-12-25: qty 1

## 2020-12-25 NOTE — ED Provider Notes (Signed)
[facility] Provider Note   CSN: [id number] Arrival date & time: 12/25/20  1739     History Chief Complaint  Patient presents with   Back Pain    Ryan Davis is a 60 y.o. male past medical history significant for metastatic prostate cancer who presents with back pain, concern for urinary tract infection.  Patient reports that he had change in color of urine this morning, and has had difficulty urinating for the rest the day.  Patient also complaining of back pain.  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  Patient with MRI on the ninth of this month, CT skin of the abdomen, and further work-up.  Discussed with patient his goals of care today, and he is primarily concerned about his pain, as well as his urine.  Given his known metastases, he does not request further work-up of his back at this time, only requesting evaluation of urine, and pain control.  Patient denies any recent fever, chills.  Patient reports last chemo was about a month ago.  Patient denies any chest pain, shortness of breath, abdominal pain, nausea, vomiting.   Back Pain     Past Medical History:  Diagnosis Date   A-fib [facility])    see 03-28-16 admission   Back pain without radiculopathy 03/28/2016   Dyspnea    endorses since treatment radiation , still gets SOB occasionally    Dysrhythmia    afib    Goals of care, counseling/discussion 06/24/2017   History of radiation therapy 09/16/16-10/06/16   35 Gy in 14 fractions to the lumbar spine   Nocturia    Prostate cancer metastatic to bone ([place]) 03/31/2016   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[place]) 03/31/2016    Patient Active Problem List   Diagnosis Date Noted   Intractable abdominal pain 12/04/2020   Hypokalemia due to excessive gastrointestinal loss of potassium 12/04/2020   Dilation of biliary tract 12/04/2020   Abnormal LFTs 12/04/2020   GERD without esophagitis 12/04/2020   Abdominal pain 10/18/2020   Chronic pain  syndrome 12/02/2017   Goals of care, counseling/discussion 06/24/2017   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[place]) 03/31/2016   Prostate cancer metastatic to bone ([place]) 03/31/2016   AF (paroxysmal atrial fibrillation) ([place]) 03/30/2016   Back pain without radiculopathy 03/28/2016   Atrial fibrillation with RVR ([place]) 03/28/2016    Past Surgical History:  Procedure Laterality Date   IR IMAGING GUIDED PORT INSERTION  09/05/2020   MULTIPLE EXTRACTIONS WITH ALVEOLOPLASTY N/A 11/19/2016   Procedure: EXTRACTION OF TOOTH #'S 1,3,5-7,9-17, AND 20- 29 WITH ALVEOLOPLASTY, BILATERAL MANDIBULAR TORI REDUCTIONS AND BILATERAL MAXILLARY BUCCAL EXOSTOSES REDUCTIONS;  Surgeon: Lenn Cal, DDS;  Location: [facility] ORS;  Service: Oral Surgery;  Laterality: N/A;   NO PAST SURGERIES         Family History  Problem Relation Age of Onset   Stroke Sister    Lupus Mother    Cancer Neg Hx    Heart disease Neg Hx    Diabetes Neg Hx     Social History   Tobacco Use   Smoking status: Former    Packs/day: 0.50    Years: 39.00    Pack years: 19.50    Types: Cigarettes    Quit date: 04/30/2020    Years since quitting: 0.6   Smokeless tobacco: Never  Vaping Use   Vaping Use: Never used  Substance Use Topics   Alcohol use: Not Currently   Drug use: No  Home Medications Prior to Admission medications   Medication Sig Start Date End Date Taking? Authorizing Provider  famotidine (PEPCID) 40 MG tablet Take 1 tablet (40 mg total) by mouth 2 (two) times daily. 09/27/20   [name], MD  fentaNYL (DURAGESIC) 100 MCG/HR Place 2 patches onto the skin every other day. 12/10/20   Caren Griffins, MD  fentaNYL (DURAGESIC) 75 MCG/HR Place 1 patch onto the skin every other day. 12/13/20   Pickenpack-Cousar, Carlena Sax, NP  gabapentin (NEURONTIN) 800 MG tablet Take 800 mg by mouth 3 (three) times daily. May take an additional 400mg  as needed for pain    [provider]  metoprolol tartrate  (LOPRESSOR) 25 MG tablet TAKE 1/2 TABLET(12.5 MG) BY MOUTH TWICE DAILY Patient taking differently: Take 12.5 mg by mouth 2 (two) times daily. 09/24/20   [name], MD  naloxegol oxalate (MOVANTIK) 12.5 MG TABS tablet Take 1 tablet (12.5 mg total) by mouth daily. Patient not taking: Reported on 12/13/2020 12/13/20   Pickenpack-Cousar, Carlena Sax, NP  Naloxone HCl 0.4 MG/0.4ML SOAJ Use in the event of accidental overdose of opioids. Patient not taking: Reported on 12/14/2020 08/26/20   Arnaldo Natal, MD  pantoprazole (PROTONIX) 40 MG tablet Take 1 tablet (40 mg total) by mouth daily. 10/21/20 10/21/21  Shelly Coss, MD  Plecanatide (TRULANCE) 3 MG TABS Take by mouth. 10/24/20   [provider]  polyethylene glycol (MIRALAX / GLYCOLAX) 17 g packet Take 17 g by mouth 2 (two) times daily. 10/21/20   Shelly Coss, MD  senna (SENOKOT) 8.6 MG TABS tablet Take 2 tablets (17.2 mg total) by mouth 2 (two) times daily. 10/21/20   Shelly Coss, MD  tamsulosin (FLOMAX) 0.4 MG CAPS capsule Take 1 capsule (0.4 mg total) by mouth daily. 10/21/20   Shelly Coss, MD  prochlorperazine (COMPAZINE) 10 MG tablet Take 1 tablet (10 mg total) by mouth every 6 (six) hours as needed (Nausea or vomiting). Patient not taking: Reported on 11/12/2020 09/06/20 11/12/20  [name], MD    Allergies    Contrast media [iodinated diagnostic agents] and Other  Review of Systems   Review of Systems  Musculoskeletal:  Positive for back pain.  All other systems reviewed and are negative.  Physical Exam Updated Vital Signs BP (!) 170/121 (BP Location: Right Arm)   Pulse 87   Temp 98.3 F (36.8 C) (Oral)   Resp 20   Ht 6\' 2"  (1.88 m)   Wt 65.8 kg   SpO2 96%   BMI 18.62 kg/m   Physical Exam Vitals and nursing note reviewed.  Constitutional:      General: He is not in acute distress.    Appearance: Normal appearance.  HENT:     Head: Normocephalic and atraumatic.  Eyes:     General:         Right eye: No discharge.        Left eye: No discharge.  Cardiovascular:     Rate and Rhythm: Normal rate and regular rhythm.     Pulses: Normal pulses.     Heart sounds: No murmur heard.   No friction rub. No gallop.     Comments: Intact DP, PT pulses bilaterally Pulmonary:     Effort: Pulmonary effort is normal.     Breath sounds: Normal breath sounds.  Abdominal:     General: Bowel sounds are normal.     Palpations: Abdomen is soft.     Comments: Minimal suprapubic tenderness  to palpation  Musculoskeletal:     Comments: Midline spinal tenderness in thoracic spine, no step off noted. Intact strength bilateral lower extremities. Intact gait.  Skin:    General: Skin is warm and dry.     Capillary Refill: Capillary refill takes less than 2 seconds.  Neurological:     Mental Status: He is alert and oriented to person, place, and time.  Psychiatric:        Mood and Affect: Mood normal.        Behavior: Behavior normal.    ED Results / Procedures / Treatments   Labs (all labs ordered are listed, but only abnormal results are displayed) Labs Reviewed  CBC WITH DIFFERENTIAL/PLATELET - Abnormal; Notable for the following components:      Result Value   RBC 4.12 (*)    Hemoglobin 12.4 (*)    HCT 36.1 (*)    RDW 16.9 (*)    Platelets 147 (*)    nRBC 0.5 (*)    Abs Immature Granulocytes 0.30 (*)    All other components within normal limits  BASIC METABOLIC PANEL - Abnormal; Notable for the following components:   Sodium 133 (*)    Glucose, Bld 121 (*)    Calcium 8.7 (*)    All other components within normal limits  RESP PANEL BY RT-PCR (FLU A&B, COVID) ARPGX2  URINALYSIS, ROUTINE W REFLEX MICROSCOPIC    EKG None  Radiology No results found.  Procedures Procedures   Medications Ordered in ED Medications  HYDROmorphone (DILAUDID) injection 0.5 mg (0.5 mg Intravenous Given 12/25/20 2253)  HYDROmorphone (DILAUDID) injection 1 mg (1 mg Intravenous Given 12/26/20 0010)   sodium chloride 0.9 % bolus 1,000 mL (1,000 mLs Intravenous New Bag/Given 12/26/20 0010)    ED Course  I have reviewed the triage vital signs and the nursing notes.  Pertinent labs & imaging results that were available during my care of the patient were reviewed by me and considered in my medical decision making (see chart for details).    MDM Rules/Calculators/A&P                         I discussed this case with my attending physician who cosigned this note including patient's presenting symptoms, physical exam, and planned diagnostics and interventions. Attending physician stated agreement with plan or made changes to plan which were implemented.   Attending physician assessed patient at bedside.  Patient with known metastatic prostate cancer, with metastases to the spine, recent evaluation and MRI of the spine.  Patient complaining of dark urine this morning with feeling of incomplete emptying of bladder.  Not been able to urinate since this morning.  Patient denies pain with urination but is concerned about potential for infection.  Patient denies fever, chills, nausea, vomiting.  Patient also endorses some significant back pain that is known to him.  Patient has midline spinal tenderness consistent with his presentation of spinal metastases.  Spoke about care planning with patient and discussed that he has known metastatic disease, so we will not further investigate his back pain at this time.  Do want to elucidate, and check for potential urinary obstruction given patient's urinary symptoms, risk for obstruction with prostate pathology.  Unable to perform in and out cath at this time per nursing, bladder scan does show over 200 mL.   Patient with recent MRI evaluation of spine, no concern for cauda equina syndrome at this time.  Patient with no  numbness or tingling of the legs, no numbness or tingling of the groin.  Patient with palliative control of cancer at this time per his oncology  team.  Patient with no history of similar urinary retention. Intact perineal sensation at this time.  Will obtain basic screening lab work to evaluate for kidney function, and begin fluids to help to attempt patient to urinate.  We will attempt to place coud catheter at this time. Unable to place coude catheter at this time.   12:50 AM Care of Ryan Davis transferred to Dr. Randal Buba at the end of my shift as the patient will require reassessment once labs/imaging have resulted. Patient presentation, ED course, and plan of care discussed with review of all pertinent labs and imaging. Please see his/her note for further details regarding further ED course and disposition. Plan at time of handoff is to consult urology for further evaluation and concern for acute urinary retention. This may be altered or completely changed at the discretion of the oncoming team pending results of further workup.  Final Clinical Impression(s) / ED Diagnoses Final diagnoses:  None    Rx / DC Orders ED Discharge Orders     None        Anselmo Pickler, PA-C 12/26/20 0104    Gareth Morgan, MD 12/26/20 1228

## 2020-12-25 NOTE — ED Triage Notes (Signed)
Back pain.  Here to r/o infection. Urinary retention.

## 2020-12-25 NOTE — ED Notes (Signed)
Attempted In&Out catheter. Unsuccessful.

## 2020-12-25 NOTE — ED Notes (Signed)
ED Provider at bedside. 

## 2020-12-26 DIAGNOSIS — R39198 Other difficulties with micturition: Secondary | ICD-10-CM | POA: Diagnosis present

## 2020-12-26 DIAGNOSIS — M546 Pain in thoracic spine: Secondary | ICD-10-CM | POA: Diagnosis not present

## 2020-12-26 DIAGNOSIS — Z85830 Personal history of malignant neoplasm of bone: Secondary | ICD-10-CM | POA: Diagnosis not present

## 2020-12-26 DIAGNOSIS — Z87891 Personal history of nicotine dependence: Secondary | ICD-10-CM | POA: Diagnosis not present

## 2020-12-26 DIAGNOSIS — Z8546 Personal history of malignant neoplasm of prostate: Secondary | ICD-10-CM | POA: Diagnosis not present

## 2020-12-26 DIAGNOSIS — Z20822 Contact with and (suspected) exposure to covid-19: Secondary | ICD-10-CM | POA: Diagnosis not present

## 2020-12-26 LAB — CBC WITH DIFFERENTIAL/PLATELET
Abs Immature Granulocytes: 0.3 10*3/uL — ABNORMAL HIGH (ref 0.00–0.07)
Basophils Absolute: 0 10*3/uL (ref 0.0–0.1)
Basophils Relative: 0 %
Eosinophils Absolute: 0.1 10*3/uL (ref 0.0–0.5)
Eosinophils Relative: 1 %
HCT: 36.1 % — ABNORMAL LOW (ref 39.0–52.0)
Hemoglobin: 12.4 g/dL — ABNORMAL LOW (ref 13.0–17.0)
Immature Granulocytes: 3 %
Lymphocytes Relative: 29 %
Lymphs Abs: 3.1 10*3/uL (ref 0.7–4.0)
MCH: 30.1 pg (ref 26.0–34.0)
MCHC: 34.3 g/dL (ref 30.0–36.0)
MCV: 87.6 fL (ref 80.0–100.0)
Monocytes Absolute: 0.9 10*3/uL (ref 0.1–1.0)
Monocytes Relative: 8 %
Neutro Abs: 6 10*3/uL (ref 1.7–7.7)
Neutrophils Relative %: 59 %
Platelets: 147 10*3/uL — ABNORMAL LOW (ref 150–400)
RBC: 4.12 MIL/uL — ABNORMAL LOW (ref 4.22–5.81)
RDW: 16.9 % — ABNORMAL HIGH (ref 11.5–15.5)
WBC: 10.4 10*3/uL (ref 4.0–10.5)
nRBC: 0.5 % — ABNORMAL HIGH (ref 0.0–0.2)

## 2020-12-26 LAB — BASIC METABOLIC PANEL
Anion gap: 12 (ref 5–15)
BUN: 12 mg/dL (ref 6–20)
CO2: 23 mmol/L (ref 22–32)
Calcium: 8.7 mg/dL — ABNORMAL LOW (ref 8.9–10.3)
Chloride: 98 mmol/L (ref 98–111)
Creatinine, Ser: 0.62 mg/dL (ref 0.61–1.24)
GFR, Estimated: >60 mL/min (ref >60)
Glucose, Bld: 121 mg/dL — ABNORMAL HIGH (ref 70–99)
Potassium: 3.6 mmol/L (ref 3.5–5.1)
Sodium: 133 mmol/L — ABNORMAL LOW (ref 135–145)

## 2020-12-26 LAB — URINALYSIS, ROUTINE W REFLEX MICROSCOPIC
Bacteria, UA: NONE SEEN
Bilirubin Urine: NEGATIVE
Glucose, UA: NEGATIVE mg/dL
Ketones, ur: 5 mg/dL — AB
Nitrite: NEGATIVE
Protein, ur: 30 mg/dL — AB
Specific Gravity, Urine: 1.029 (ref 1.005–1.030)
pH: 5 (ref 5.0–8.0)

## 2020-12-26 LAB — RESP PANEL BY RT-PCR (FLU A&B, COVID) ARPGX2
Influenza A by PCR: NEGATIVE
Influenza B by PCR: NEGATIVE
SARS Coronavirus 2 by RT PCR: NEGATIVE

## 2020-12-26 MED ORDER — HEPARIN SOD (PORK) LOCK FLUSH 100 UNIT/ML IV SOLN
500.0000 [IU] | Freq: Once | INTRAVENOUS | Status: AC
  Administered 2020-12-26: 500 [IU]
  Filled 2020-12-26: qty 5

## 2020-12-26 MED ORDER — ONDANSETRON HCL 4 MG/2ML IJ SOLN
4.0000 mg | Freq: Once | INTRAMUSCULAR | Status: AC
  Administered 2020-12-26: 4 mg via INTRAVENOUS
  Filled 2020-12-26: qty 2

## 2020-12-26 MED ORDER — FENTANYL CITRATE PF 50 MCG/ML IJ SOSY
25.0000 ug | PREFILLED_SYRINGE | Freq: Once | INTRAMUSCULAR | Status: AC
  Administered 2020-12-26: 25 ug via INTRAVENOUS
  Filled 2020-12-26: qty 1

## 2020-12-26 MED ORDER — HYDROMORPHONE HCL 1 MG/ML IJ SOLN
1.0000 mg | Freq: Once | INTRAMUSCULAR | Status: AC
  Administered 2020-12-26: 1 mg via INTRAVENOUS
  Filled 2020-12-26: qty 1

## 2020-12-26 MED ORDER — FENTANYL CITRATE PF 50 MCG/ML IJ SOSY: 25.0000 ug | PREFILLED_SYRINGE | Freq: Once | INTRAMUSCULAR | Status: DC

## 2020-12-26 MED ORDER — SODIUM CHLORIDE 0.9 % IV BOLUS
1000.0000 mL | Freq: Once | INTRAVENOUS | Status: AC
  Administered 2020-12-26: 1000 mL via INTRAVENOUS

## 2020-12-26 MED ORDER — LIDOCAINE HCL URETHRAL/MUCOSAL 2 % EX GEL
1.0000 "application " | Freq: Once | CUTANEOUS | Status: AC
  Administered 2020-12-26: 1 via URETHRAL
  Filled 2020-12-26: qty 22

## 2020-12-26 NOTE — ED Provider Notes (Signed)
102 case d/w Dr. Christene Slates of urology will need to go ED for catheter placement    Pegi Milazzo, MD 12/26/20 6438

## 2020-12-26 NOTE — ED Notes (Signed)
Attempted to place 66F coude catheter in per MD. Unsuccessful attempt. Pt refusing other attempts. Pt attempted to urinate afterwards and unable to.

## 2020-12-26 NOTE — ED Notes (Signed)
[facility] at beside

## 2020-12-26 NOTE — ED Provider Notes (Signed)
Patient transferred for Foley catheter.  Patient with acute urinary retention secondary to bladder outlet obstruction.  Physical Exam  BP (!) 180/111 (BP Location: Right Arm)   Pulse (!) 110   Temp 98.3 F (36.8 C) (Oral)   Resp 18   Ht 6\' 2"  (1.88 m)   Wt 65.8 kg   SpO2 100%   BMI 18.62 kg/m   Physical Exam Vitals reviewed.  Constitutional:      General: He is in acute distress.  HENT:     Head: Normocephalic.  Cardiovascular:     Rate and Rhythm: Normal rate and regular rhythm.  Pulmonary:     Effort: Pulmonary effort is normal.     Breath sounds: Normal breath sounds.  Abdominal:     Tenderness: There is abdominal tenderness.  Musculoskeletal:        General: Normal range of motion.  Skin:    General: Skin is warm.  Neurological:     General: No focal deficit present.     Mental Status: He is oriented to person, place, and time.    ED Course/Procedures     BLADDER CATHETERIZATION  Date/Time: 12/26/2020 4:08 AM Performed by: Orpah Greek, MD Authorized by: Orpah Greek, MD   Consent:    Consent obtained:  Verbal   Consent given by:  Patient   Risks, benefits, and alternatives were discussed: yes     Risks discussed:  Urethral injury, pain, incomplete procedure, false passage and infection Universal protocol:    Procedure explained and questions answered to patient or proxy's satisfaction: yes     Relevant documents present and verified: yes     Test results available: yes     Required blood products, implants, devices, and special equipment available: yes     Site/side marked: yes     Immediately prior to procedure, a time out was called: yes     Patient identity confirmed:  Verbally with patient Pre-procedure details:    Procedure purpose:  Therapeutic   Preparation: Patient was prepped and draped in usual sterile fashion   Anesthesia:    Anesthesia method:  Topical application   Topical anesthetic:  Lidocaine gel Procedure  details:    Provider performed due to:  Complicated insertion and nurse unable to complete   Catheter insertion:  Indwelling   Catheter type:  Coude   Catheter size:  18 Fr   Bladder irrigation: no     Number of attempts:  1   Urine characteristics:  Clear Post-procedure details:    Procedure completion:  Tolerated well, no immediate complications  MDM  Patient transferred for possible Foley catheter placed by urology.  Patient very uncomfortable at arrival secondary to worsening retention.  I therefore did place the Foley catheter myself.       Orpah Greek, MD 12/26/20 [id number]

## 2021-01-01 ENCOUNTER — Other Ambulatory Visit: Payer: Self-pay | Admitting: Nurse Practitioner

## 2021-01-01 ENCOUNTER — Telehealth: Payer: Self-pay

## 2021-01-01 DIAGNOSIS — M5441 Lumbago with sciatica, right side: Secondary | ICD-10-CM

## 2021-01-01 DIAGNOSIS — C61 Malignant neoplasm of prostate: Secondary | ICD-10-CM

## 2021-01-01 DIAGNOSIS — C7951 Secondary malignant neoplasm of bone: Secondary | ICD-10-CM

## 2021-01-01 DIAGNOSIS — G8929 Other chronic pain: Secondary | ICD-10-CM

## 2021-01-01 MED ORDER — FENTANYL 100 MCG/HR TD PT72: 2.0000 | MEDICATED_PATCH | TRANSDERMAL | 0 refills | Status: AC

## 2021-01-01 MED ORDER — OXYCODONE HCL 30 MG PO TABS: 30.0000 mg | ORAL_TABLET | ORAL | 0 refills | Status: DC | PRN

## 2021-01-01 NOTE — Telephone Encounter (Signed)
Ryan Davis called this morning needing a refill on his Fentanyl 100 mcg patch and Oxycodone medication. I notified Nikki, NP, who sent in for the refills. I told him to call back with any questions/concerns. Understanding verbalized.

## 2021-01-03 ENCOUNTER — Other Ambulatory Visit: Payer: Self-pay

## 2021-01-03 ENCOUNTER — Inpatient Hospital Stay: Payer: Medicare Other | Attending: Hematology & Oncology | Admitting: Nurse Practitioner

## 2021-01-03 VITALS — BP 107/77 | HR 104 | Temp 98.7°F | Resp 17 | Wt 132.5 lb

## 2021-01-03 DIAGNOSIS — K5903 Drug induced constipation: Secondary | ICD-10-CM

## 2021-01-03 DIAGNOSIS — C61 Malignant neoplasm of prostate: Secondary | ICD-10-CM | POA: Insufficient documentation

## 2021-01-03 DIAGNOSIS — Z5111 Encounter for antineoplastic chemotherapy: Secondary | ICD-10-CM | POA: Insufficient documentation

## 2021-01-03 DIAGNOSIS — Z515 Encounter for palliative care: Secondary | ICD-10-CM

## 2021-01-03 DIAGNOSIS — G893 Neoplasm related pain (acute) (chronic): Secondary | ICD-10-CM

## 2021-01-03 DIAGNOSIS — Z7189 Other specified counseling: Secondary | ICD-10-CM

## 2021-01-03 DIAGNOSIS — R530 Neoplastic (malignant) related fatigue: Secondary | ICD-10-CM | POA: Diagnosis not present

## 2021-01-03 MED ORDER — DRONABINOL 2.5 MG PO CAPS: 2.5000 mg | ORAL_CAPSULE | Freq: Two times a day (BID) | ORAL | 0 refills | Status: DC

## 2021-01-03 NOTE — Patient Instructions (Addendum)
Thank you for coming in today. Please follow instructions below:   Continue taking the Movantik for bowels in addition to Senna daily.  Continue current pain regimen: Oxycodone and Fentanyl patches. We did not make any changes.  I think most of your pain is coming from your constipation.  Please take magnesium Citrate (can pick up over the counter). Take half a bottle if no BM in 4 hrs take the other half.  I have sent in prescription for appetite stimulant. Take at night before bed initially to see how you will do.  Increase fluid intake I will see you back in 1-2 weeks

## 2021-01-03 NOTE — Progress Notes (Signed)
[place] [facility]  [phone] [phone]   Name: [name] Date: 01/03/2021 MRN: [id number]  DOB: 1960/06/02  Patient Care Team: Rip Harbour as PCP - General (Internal Medicine) Pickenpack-Cousar, Carlena Sax, NP as Nurse Practitioner (Nurse Practitioner)   Ryan Davis is a 60 y.o. 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  Palliative ask to see for symptom management.  SOCIAL HISTORY:     reports that he quit smoking about 8 months ago. His smoking use included cigarettes. He has a 19.50 pack-year smoking history. He has never used smokeless tobacco. He reports that he does not currently use alcohol. He reports that he does not use drugs.  ADVANCE DIRECTIVES:  Patient has a Buyer, retail. His sister [name] is primary agent and his brother Bohdan Macho) is secondary.   CODE STATUS:   PAST MEDICAL HISTORY: Past Medical History:  Diagnosis Date   A-fib [facility])    see 03-28-16 admission   Back pain without radiculopathy 03/28/2016   Dyspnea    endorses since treatment radiation , still gets SOB occasionally    Dysrhythmia    afib    Goals of care, counseling/discussion 06/24/2017   History of radiation therapy 09/16/16-10/06/16   35 Gy in 14 fractions to the lumbar spine   Nocturia    Prostate cancer metastatic to bone ([place]) 03/31/2016   Prostate cancer metastatic to multiple sites [facility]) 03/31/2016     HEMATOLOGY/ONCOLOGY HISTORY:  Oncology History  Prostate cancer metastatic to multiple sites [facility])  03/31/2016 Initial Diagnosis   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[place])   07/08/2017 - 07/08/2017 Chemotherapy   The patient had dexamethasone (DECADRON) 4 MG tablet, 8 mg, Oral, 2 times daily, 0 of 1 cycle, Start date: --, End date:  -- pegfilgrastim-cbqv (UDENYCA) injection 6 mg, 6 mg, Subcutaneous, Once, 0 of 4 cycles DOCEtaxel (TAXOTERE) 120 mg in sodium chloride 0.9 % 250 mL chemo infusion, 67.5 mg/m2 = 120 mg (90 % of original dose 75 mg/m2), Intravenous,  Once, 0 of 4 cycles Dose modification: 67.5 mg/m2 (90 % of original dose 75 mg/m2, Cycle 1, Reason: Provider Judgment)   for chemotherapy treatment.     08/27/2020 Cancer Staging   Staging form: Prostate, AJCC 8th Edition - Clinical stage from 08/27/2020: Stage IVB (cT2c, cNX, pM1b, PSA: 700, Grade Group: 4) - Signed by [name], MD on 08/27/2020 Prostate specific antigen (PSA) range: 20 or greater Gleason score: 8 Histologic grading system: 5 grade system    09/06/2020 - 10/22/2020 Chemotherapy   Patient is on Treatment Plan : PROSTATE Docetaxel + Prednisone q21d     Prostate cancer metastatic to bone ([place])  03/31/2016 Initial Diagnosis   Prostate cancer metastatic to bone ([place])   07/08/2017 - 07/08/2017 Chemotherapy   The patient had dexamethasone (DECADRON) 4 MG tablet, 8 mg, Oral, 2 times daily, 0 of 1 cycle, Start date: --, End date: -- pegfilgrastim-cbqv (UDENYCA) injection 6 mg, 6 mg, Subcutaneous, Once, 0 of 4 cycles DOCEtaxel (TAXOTERE) 120 mg in sodium chloride 0.9 % 250 mL chemo infusion, 67.5 mg/m2 = 120 mg (90 % of original dose 75 mg/m2), Intravenous,  Once, 0 of 4 cycles Dose modification: 67.5 mg/m2 (90 % of original dose 75 mg/m2, Cycle 1, Reason: Provider Judgment)   for chemotherapy treatment.  09/06/2020 - 10/22/2020 Chemotherapy   Patient is on Treatment Plan : PROSTATE Docetaxel + Prednisone q21d       ALLERGIES:  is allergic to contrast media [iodinated diagnostic agents] and other.  MEDICATIONS:  Current Outpatient Medications  Medication Sig Dispense Refill   famotidine (PEPCID) 40 MG tablet Take 1 tablet (40 mg total) by mouth 2 (two) times daily. 60 tablet 6   fentaNYL (DURAGESIC) 100 MCG/HR Place 2 patches onto the skin  every other day. 30 patch 0   fentaNYL (DURAGESIC) 75 MCG/HR Place 1 patch onto the skin every other day. 15 patch 0   gabapentin (NEURONTIN) 800 MG tablet Take 800 mg by mouth See admin instructions. Take 400mg  (one half tablet) by mouth in the morning and 1600mg  (2 tablets) at night     metoprolol tartrate (LOPRESSOR) 25 MG tablet TAKE 1/2 TABLET(12.5 MG) BY MOUTH TWICE DAILY (Patient taking differently: Take 12.5 mg by mouth 2 (two) times daily.) 60 tablet 3   naloxegol oxalate (MOVANTIK) 12.5 MG TABS tablet Take 1 tablet (12.5 mg total) by mouth daily. (Patient not taking: Reported on 12/13/2020) 30 tablet 0   Naloxone HCl 0.4 MG/0.4ML SOAJ Use in the event of accidental overdose of opioids. (Patient not taking: Reported on 12/14/2020) 0.4 mL 0   oxycodone (ROXICODONE) 30 MG immediate release tablet Take 1 tablet (30 mg total) by mouth every 4 (four) hours as needed for pain. 90 tablet 0   pantoprazole (PROTONIX) 40 MG tablet Take 1 tablet (40 mg total) by mouth daily. (Patient taking differently: Take 40 mg by mouth daily as needed (for acid reflux).) 30 tablet 0   Plecanatide (TRULANCE) 3 MG TABS Take 3 mg by mouth daily.     polyethylene glycol (MIRALAX / GLYCOLAX) 17 g packet Take 17 g by mouth 2 (two) times daily. 60 each 1   senna (SENOKOT) 8.6 MG TABS tablet Take 2 tablets (17.2 mg total) by mouth 2 (two) times daily. 120 tablet 0   tamsulosin (FLOMAX) 0.4 MG CAPS capsule Take 1 capsule (0.4 mg total) by mouth daily. 30 capsule 1   No current facility-administered medications for this visit.   Facility-Administered Medications Ordered in Other Visits  Medication Dose Route Frequency Provider Last Rate Last Admin   SONAFINE emulsion 1 application  1 application Topical BID Gery Pray, MD        VITAL SIGNS: There were no vitals taken for this visit. There were no vitals filed for this visit.  Estimated body mass index is 18.62 kg/m as calculated from the following:   Height as  of 12/25/20: 6\' 2"  (1.88 m).   Weight as of 12/25/20: 145 lb 1 oz (65.8 kg).  LABS: CBC:    Component Value Date/Time   WBC 10.4 12/26/2020 0001   HGB 12.4 (L) 12/26/2020 0001   HGB 11.1 (L) 12/14/2020 0842   HGB 12.2 (L) 12/16/2016 1022   HCT 36.1 (L) 12/26/2020 0001   HCT 36.4 (L) 12/16/2016 1022   PLT 147 (L) 12/26/2020 0001   PLT 158 12/14/2020 0842   PLT 185 12/16/2016 1022   MCV 87.6 12/26/2020 0001   MCV 92 12/16/2016 1022   NEUTROABS 6.0 12/26/2020 0001   NEUTROABS 4.6 12/16/2016 1022   LYMPHSABS 3.1 12/26/2020 0001   LYMPHSABS 1.2 12/16/2016 1022   MONOABS 0.9 12/26/2020 0001   EOSABS 0.1 12/26/2020 0001   EOSABS 0.4 12/16/2016 1022   BASOSABS 0.0 12/26/2020 0001   BASOSABS 0.0 12/16/2016 1022  Comprehensive Metabolic Panel:    Component Value Date/Time   NA 133 (L) 12/26/2020 0001   NA 143 12/16/2016 1022   K 3.6 12/26/2020 0001   K 3.5 12/16/2016 1022   CL 98 12/26/2020 0001   CL 101 12/16/2016 1022   CO2 23 12/26/2020 0001   CO2 33 12/16/2016 1022   BUN 12 12/26/2020 0001   BUN 4 (L) 12/16/2016 1022   CREATININE 0.62 12/26/2020 0001   CREATININE 0.61 12/14/2020 0842   CREATININE 0.8 12/16/2016 1022   GLUCOSE 121 (H) 12/26/2020 0001   GLUCOSE 109 12/16/2016 1022   CALCIUM 8.7 (L) 12/26/2020 0001   CALCIUM 9.4 12/16/2016 1022   AST 18 12/15/2020 0816   AST 14 (L) 12/14/2020 0842   ALT 30 12/15/2020 0816   ALT 30 12/14/2020 0842   ALT 16 12/16/2016 1022   ALKPHOS 837 (H) 12/15/2020 0816   ALKPHOS 77 12/16/2016 1022   BILITOT 0.8 12/15/2020 0816   BILITOT 0.4 12/14/2020 0842   PROT 7.6 12/15/2020 0816   PROT 6.7 12/16/2016 1022   ALBUMIN 4.5 12/15/2020 0816   ALBUMIN 3.7 12/16/2016 1022     PERFORMANCE STATUS (ECOG) : 1 - Symptomatic but completely ambulatory   Physical Exam General: NAD Cardiovascular: regular rate and rhythm Pulmonary: clear ant fields Abdomen: soft, nontender, + bowel sounds GU: no suprapubic tenderness Extremities:  no edema, no joint deformities Skin: no rashes Neurological: Weakness but otherwise nonfocal  IMPRESSION:  We discussed Her current illness and what it means in the larger context of Her on-going co-morbidities. Natural disease trajectory and expectations were discussed.  I discussed the importance of continued conversation with family and their medical providers regarding overall plan of care and treatment options, ensuring decisions are within the context of the patients values and GOCs.  Esten presents to the clinic today for follow-up with his friend Truman Hayward for support.  No acute distress noted.  We discussed at length his current pain regimen.  He feels his pain is better controlled however continues to have concerns with abdominal pain and fullness.  Reports his appetite remains poor.  On 11/29 patient's weight was 145.1lb and on today patient is weighing 132.8lb.  Some temporal wasting noted.  Patient states appetite was improving however now seems to be declining a.m.  He typically will eat a can of soup per day.  Tries to push fluids.  Friend states he or family will often cook meals for patient and he will only take a few bites.  Mr. Damian reports he has not had a bowel movement in more than a week or so.  He states he is taking his Movantik in addition to senna and MiraLAX however per friend this is questionable.  Detailed education provided to patient regarding constipation in the setting of significant opioid use.  Explained risk of bowel obstruction if he is not following regimen.  He is aware if he has not had a bowel movement in more than 24 hours he needs to take Dulcolax.  For the interim advised patient to take magnesium citrate once he gets home to facilitate a bowel movement.  He is to continue taking current regimen without any other changes.  I explained to Ryan Davis his abdominal pain is most likely related to his constipation which is why his current regimen is not significantly  improving this.  He and friend verbalized understanding with plans to follow through.  Further discussions around patient's poor appetite and the use of an appetite  stimulant in addition to education around increasing protein intake.  Advised patient to try to consume protein rich foods such as peanut butter and nuts.  Encouraged Ensure and other high-protein shakes or supplements such as Premier protein.  Increase water intake.  Advised patient to put Pedialyte, Gatorade, Crystal light prepare water, or juice in his ice trays to freeze.  He can then use the ice cubes in his water to provide flavor.  Patient verbalized understanding.  Discussed eating 5-6 small frequent meals throughout the day versus 3 large meals.  Patient would also benefit from seeing the dietitian.  Denies nausea or vomiting.  Continues to endorse some generalized weakness.  Dakwon expressed understanding and was in agreement with this plan. He also understands that He can call the clinic at any time with any questions, concerns, or complaints.   PLAN: Magnesium citrate x1 to facilitate bowel movement Continue Movantik for constipation in addition to MiraLAX and senna.  Education provided on the importance of bowel regimen in the setting of opioid use. Continue with current pain regimen with no adjustments being made on today as pain is well controlled. Marinol twice daily for appetite stimulation I will plan to see patient back in 2-3 weeks.   Time Total: 35 min.   Visit consisted of counseling and education dealing with the complex and emotionally intense issues of symptom management and palliative care in the setting of serious and potentially life-threatening illness.Greater than 50%  of this time was spent counseling and coordinating care related to the above assessment and plan.  Signed by: Alda Lea, AGPCNP-BC Palliative Medicine Team

## 2021-01-03 NOTE — Progress Notes (Signed)
  Outpatient Palliative Care  [phone] ________________________________ Nursing Assessment:  Name: Ryan Davis        MRN: [id number]  Date of Service: 01/03/2021 DOB: 07-18-1960 Visit Type: Follow up-pain management, constipation   Support at Visit: Truman Hayward, friend who lives nearby   Review of Systems: General: Mr. Balis reports feeling weak and fatigued. Stated he has to nap throughout the day.  Weight loss-13lbs.  Reports not sleeping at night due to being woken up by pain.   Neuro: Reports numbness/tingling in feet and feels that the Gabepentin isn't helping.  Reports dizziness at times. Cardiac: None Pulmonary: Reports SHOB with activity.   GI: Reports a decrease in appetite-25% normal.  No N/V. Taking Ensure for supplementation.  Reports constipation-states he hasn't had a BM in a couple days. Asked if he was taking his Miralax which he reports he is.  GU: Recent trip to ED for urinary retention and was in and out catheterized. Reports improvement in urinating.  Integumentary: None Psychological: Mr. Bartholomew reports no anxiety or depression. He expresses frustration with his constipation and abdominal pain. Also expressed gratitude for his friend Truman Hayward with him at the visit.    Medication Changes/Additions: None     Pain Review: Scale of 0-10: 8. Location: Back Description: Sharp, aching Frequency: Constant-not able to sleep Relief: none Also reporting pain in abdomen   Social Review: Living Situation: Lives with grandson Support: Truman Hayward, friend, Yolanda Bonine, sisters and brothers   Falls in the last 3 months? None  Assistive Device Use? Yes, uses cane. Advised Mr. Chew to notify our team if he needs any other DME. Understanding verbalized.   Functional Status: Mostly independent-completes ADLs independently  ACP Form Review: Yes, Mr. Fentress states he has an Forensic scientist and it is in the chart.   Family/Patient Concerns: Mr. Beveridge is concerned with his  constipation and resulting abdominal pain.   Provider notified of assessment and patient/family concerns. Patient instructed to call with any questions or concerns.

## 2021-01-04 ENCOUNTER — Telehealth: Payer: Self-pay

## 2021-01-04 ENCOUNTER — Telehealth: Payer: Self-pay | Admitting: Nurse Practitioner

## 2021-01-04 NOTE — Telephone Encounter (Signed)
Ryan Davis called this morning to inquire about his medications at the pharmacy. He stated that he was told his Marinol needed a PA. [name], NP sent that in. I called the pharmacy to see if it had been approved but it hadn't yet gone through. I also clarified and educated Ryan Davis about his medications, as there was some confusion. I explained that the Marinol was to help his appetite, the Movantik is to help with a BM along with the Magnesium Citrate. He stated that he couldn't find the Magnesium Citrate anywhere. Nikki, NP advised that he could take Milk of Magnesia instead. The pharmacy I spoke to also said they could help him locate either of these medications. I called the pharmacy back this evening to make sure the PA went through and they said it did and Ryan Davis had picked it up. All questions answered. Ryan Davis advised to call back with any questions/concerns. Understanding verbalized.

## 2021-01-04 NOTE — Telephone Encounter (Signed)
Scheduled per 12/8 los, pt is aware but said they are not sure if they have other plans that day. Will mail calender per pt request and told pt to call back if appt needs to be r/s

## 2021-01-11 ENCOUNTER — Inpatient Hospital Stay: Payer: Medicare Other | Admitting: Family

## 2021-01-11 ENCOUNTER — Telehealth: Payer: Self-pay | Admitting: *Deleted

## 2021-01-11 ENCOUNTER — Inpatient Hospital Stay: Payer: Medicare Other

## 2021-01-11 ENCOUNTER — Ambulatory Visit: Payer: Medicare Other | Admitting: Family

## 2021-01-11 ENCOUNTER — Other Ambulatory Visit: Payer: Medicare Other

## 2021-01-11 NOTE — Telephone Encounter (Signed)
Call received from patient stating that he is having diarrhea this morning and would like to reschedule today's appts to next week.  Pt instructed to take Imodium after each loose stool with a max of 8 a day and push fluids.  Teach back done. Message sent to scheduling and [place] NP notified.

## 2021-01-14 ENCOUNTER — Other Ambulatory Visit: Payer: Medicare Other

## 2021-01-14 ENCOUNTER — Inpatient Hospital Stay: Payer: Medicare Other

## 2021-01-14 ENCOUNTER — Telehealth: Payer: Self-pay

## 2021-01-14 ENCOUNTER — Encounter: Payer: Self-pay | Admitting: Family

## 2021-01-14 ENCOUNTER — Ambulatory Visit: Payer: Medicare Other | Admitting: Family

## 2021-01-14 ENCOUNTER — Other Ambulatory Visit: Payer: Self-pay

## 2021-01-14 ENCOUNTER — Other Ambulatory Visit: Payer: Self-pay | Admitting: Nurse Practitioner

## 2021-01-14 ENCOUNTER — Inpatient Hospital Stay (HOSPITAL_BASED_OUTPATIENT_CLINIC_OR_DEPARTMENT_OTHER): Payer: Medicare Other | Admitting: Family

## 2021-01-14 VITALS — BP 119/80 | HR 98 | Temp 98.0°F | Resp 17 | Ht 74.0 in | Wt 131.0 lb

## 2021-01-14 DIAGNOSIS — Z515 Encounter for palliative care: Secondary | ICD-10-CM

## 2021-01-14 DIAGNOSIS — C61 Malignant neoplasm of prostate: Secondary | ICD-10-CM

## 2021-01-14 DIAGNOSIS — Z5111 Encounter for antineoplastic chemotherapy: Secondary | ICD-10-CM | POA: Diagnosis not present

## 2021-01-14 DIAGNOSIS — C7951 Secondary malignant neoplasm of bone: Secondary | ICD-10-CM

## 2021-01-14 DIAGNOSIS — G893 Neoplasm related pain (acute) (chronic): Secondary | ICD-10-CM

## 2021-01-14 DIAGNOSIS — R531 Weakness: Secondary | ICD-10-CM

## 2021-01-14 DIAGNOSIS — K5901 Slow transit constipation: Secondary | ICD-10-CM

## 2021-01-14 LAB — CBC WITH DIFFERENTIAL (CANCER CENTER ONLY)
Abs Immature Granulocytes: 0.24 10*3/uL — ABNORMAL HIGH (ref 0.00–0.07)
Basophils Absolute: 0 10*3/uL (ref 0.0–0.1)
Basophils Relative: 0 %
Eosinophils Absolute: 0.1 10*3/uL (ref 0.0–0.5)
Eosinophils Relative: 1 %
HCT: 32 % — ABNORMAL LOW (ref 39.0–52.0)
Hemoglobin: 10.6 g/dL — ABNORMAL LOW (ref 13.0–17.0)
Immature Granulocytes: 3 %
Lymphocytes Relative: 35 %
Lymphs Abs: 2.6 10*3/uL (ref 0.7–4.0)
MCH: 29.9 pg (ref 26.0–34.0)
MCHC: 33.1 g/dL (ref 30.0–36.0)
MCV: 90.1 fL (ref 80.0–100.0)
Monocytes Absolute: 0.3 10*3/uL (ref 0.1–1.0)
Monocytes Relative: 5 %
Neutro Abs: 4.3 10*3/uL (ref 1.7–7.7)
Neutrophils Relative %: 56 %
Platelet Count: 126 10*3/uL — ABNORMAL LOW (ref 150–400)
RBC: 3.55 MIL/uL — ABNORMAL LOW (ref 4.22–5.81)
RDW: 17.2 % — ABNORMAL HIGH (ref 11.5–15.5)
WBC Count: 7.5 10*3/uL (ref 4.0–10.5)
nRBC: 1.2 % — ABNORMAL HIGH (ref 0.0–0.2)

## 2021-01-14 LAB — CMP (CANCER CENTER ONLY)
ALT: 16 U/L (ref 0–44)
AST: 15 U/L (ref 15–41)
Albumin: 4 g/dL (ref 3.5–5.0)
Alkaline Phosphatase: 587 U/L — ABNORMAL HIGH (ref 38–126)
Anion gap: 9 (ref 5–15)
BUN: 9 mg/dL (ref 6–20)
CO2: 25 mmol/L (ref 22–32)
Calcium: 9.1 mg/dL (ref 8.9–10.3)
Chloride: 100 mmol/L (ref 98–111)
Creatinine: 0.67 mg/dL (ref 0.61–1.24)
GFR, Estimated: >60 mL/min (ref >60)
Glucose, Bld: 178 mg/dL — ABNORMAL HIGH (ref 70–99)
Potassium: 4.2 mmol/L (ref 3.5–5.1)
Sodium: 134 mmol/L — ABNORMAL LOW (ref 135–145)
Total Bilirubin: 0.4 mg/dL (ref 0.3–1.2)
Total Protein: 6.5 g/dL (ref 6.5–8.1)

## 2021-01-14 MED ORDER — ZOLEDRONIC ACID 4 MG/100ML IV SOLN
4.0000 mg | Freq: Once | INTRAVENOUS | Status: AC
  Administered 2021-01-14: 15:00:00 4 mg via INTRAVENOUS
  Filled 2021-01-14: qty 100

## 2021-01-14 MED ORDER — SODIUM CHLORIDE 0.9 % IV SOLN: Freq: Once | INTRAVENOUS | Status: AC

## 2021-01-14 MED ORDER — HEPARIN SOD (PORK) LOCK FLUSH 100 UNIT/ML IV SOLN
500.0000 [IU] | Freq: Once | INTRAVENOUS | Status: AC
  Administered 2021-01-14: 16:00:00 500 [IU] via INTRAVENOUS

## 2021-01-14 MED ORDER — SODIUM CHLORIDE 0.9% FLUSH
10.0000 mL | INTRAVENOUS | Status: DC | PRN
  Administered 2021-01-14: 16:00:00 10 mL via INTRAVENOUS

## 2021-01-14 MED ORDER — LEUPROLIDE ACETATE (3 MONTH) 22.5 MG ~~LOC~~ KIT
22.5000 mg | PACK | Freq: Once | SUBCUTANEOUS | Status: AC
  Administered 2021-01-14: 15:00:00 22.5 mg via SUBCUTANEOUS
  Filled 2021-01-14: qty 22.5

## 2021-01-14 MED ORDER — FENTANYL 75 MCG/HR TD PT72: 1.0000 | MEDICATED_PATCH | TRANSDERMAL | 0 refills | Status: DC

## 2021-01-14 NOTE — Progress Notes (Signed)
Hematology and Oncology Follow Up Visit  Ryan Davis [id number] September 24, 1960 60 y.o. 01/14/2021   Principle Diagnosis:  Metastatic prostate cancer - androgen resistant    Past Therapy: 14 fractions of radiation to the lumbar spine Casodex 50 mg by mouth daily - stopped 04/02/2017 due to progression  Zytiga 1000 mg by mouth daily/prednisone 10 mg by mouth daily - started on 04/20/2016 - d/c on 04/02/2017 Xtandi 160 mg po q day - started on 04/06/2017 -- d/c on 06/24/2017 Flutamide 250 mg po q8hr - started 07/08/2017 -- d/c on 04/23/18 Taxotere --  s/p cycle #3--  start on 09/05/2020 -- d/c on 11/12/2020 due to progression Apalutamide 240 mg po q day -- started on 04/28/2018- DC'd   Current Therapy:        Lupron 11.5 mg IM every 3 month - next dose 03/2021  Zometa 4 mg IV q 3 months - next dose 03/2021 Pluvicto -- January 2023   Interim History:  Ryan Davis is here today for follow-up and treatment. He states that he is scheduled for Pluvicto in January 2023.  He has abdominal discomfort he states from the pain medication. He is taking pepcid BID along with MIralax and a stool softener for constipation.  He has not noted ant blood loss with BM.  He is fatigued and feels weak. No falls or syncope to report.  He has occasional night sweats. No hot flashes.  He has SOB with over exertion and takes a break to rest as needed.  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  Palliative care is working closely with him to help manage his chronic lower back and leg pain.  He ambulates with his cane for added support. No falls or syncope to report.  No bruising or petechiae.  He does not have much of an appetite but states that he is supplementing with 3 ensure daily.   ECOG Performance Status: 1 - Symptomatic but completely ambulatory  Medications:  Allergies as of 01/14/2021       Reactions   Contrast Media [iodinated Diagnostic Agents] Nausea And  Vomiting   Patient vomits immediately after IV contrast is injected just prior to start of scan, was unable to scan patient in correct time frame.  Patient did not warn me of this when dosing the patient with oral CM and asking the patient history questions until the patient was on the table for the CT scan just prior to injection.  Ct Tech:  Karl Bales 07/29/17    Other Nausea And Vomiting   Patient vomits immediately after IV contrast is injected just prior to start of scan, was unable to scan patient in correct time frame.  Patient did not warn me of this when dosing the patient with oral CM and asking the patient history questions until the patient was on the table for the CT scan just prior to injection.  Ct Tech:  Karl Bales 07/29/17         Medication List        Accurate as of January 14, 2021  2:18 PM. If you have any questions, ask your nurse or doctor.          dronabinol 2.5 MG capsule Commonly known as: MARINOL Take 1 capsule (2.5 mg total) by mouth 2 (two) times daily before a meal.   famotidine 40 MG tablet Commonly known as: PEPCID Take 1 tablet (40 mg total) by mouth 2 (two) times daily.  fentaNYL 100 MCG/HR Commonly known as: [place] 2 patches onto the skin every other day.   fentaNYL 75 MCG/HR Commonly known as: [place] 1 patch onto the skin every other day.   gabapentin 800 MG tablet Commonly known as: NEURONTIN Take 800 mg by mouth See admin instructions. Take 400mg  (one half tablet) by mouth in the morning and 1600mg  (2 tablets) at night   metoprolol tartrate 25 MG tablet Commonly known as: LOPRESSOR TAKE 1/2 TABLET(12.5 MG) BY MOUTH TWICE DAILY What changed: See the new instructions.   naloxegol oxalate 12.5 MG Tabs tablet Commonly known as: MOVANTIK Take 1 tablet (12.5 mg total) by mouth daily.   Naloxone HCl 0.4 MG/0.4ML Soaj Use in the event of accidental overdose of opioids.   oxycodone 30 MG immediate release  tablet Commonly known as: ROXICODONE Take 1 tablet (30 mg total) by mouth every 4 (four) hours as needed for pain.   pantoprazole 40 MG tablet Commonly known as: Protonix Take 1 tablet (40 mg total) by mouth daily. What changed:  when to take this reasons to take this   polyethylene glycol 17 g packet Commonly known as: MIRALAX / GLYCOLAX Take 17 g by mouth 2 (two) times daily.   senna 8.6 MG Tabs tablet Commonly known as: SENOKOT Take 2 tablets (17.2 mg total) by mouth 2 (two) times daily.   tamsulosin 0.4 MG Caps capsule Commonly known as: FLOMAX Take 1 capsule (0.4 mg total) by mouth daily.   Trulance 3 MG Tabs Generic drug: Plecanatide Take 3 mg by mouth daily.        Allergies:  Allergies  Allergen Reactions   Contrast Media [Iodinated Diagnostic Agents] Nausea And Vomiting    Patient vomits immediately after IV contrast is injected just prior to start of scan, was unable to scan patient in correct time frame.  Patient did not warn me of this when dosing the patient with oral CM and asking the patient history questions until the patient was on the table for the CT scan just prior to injection.  Ct Tech:  Karl Bales 07/29/17    Other Nausea And Vomiting    Patient vomits immediately after IV contrast is injected just prior to start of scan, was unable to scan patient in correct time frame.  Patient did not warn me of this when dosing the patient with oral CM and asking the patient history questions until the patient was on the table for the CT scan just prior to injection.  Ct Tech:  Karl Bales 07/29/17     Past Medical History, Surgical history, Social history, and Family History were reviewed and updated.  Review of Systems: All other 10 point review of systems is negative.   Physical Exam:  height is 6\' 2"  (1.88 m) and weight is 131 lb 0.6 oz (59.4 kg). His oral temperature is 98 F (36.7 C). His blood pressure is 119/80 and his pulse is 98. His respiration  is 17 and oxygen saturation is 98%.   Wt Readings from Last 3 Encounters:  01/14/21 131 lb 0.6 oz (59.4 kg)  01/14/21 131 lb 0.6 oz (59.4 kg)  01/03/21 132 lb 8 oz (60.1 kg)    Ocular: Sclerae unicteric, pupils equal, round and reactive to light Ear-nose-throat: Oropharynx clear, dentition fair Lymphatic: No cervical or supraclavicular adenopathy Lungs no rales or rhonchi, good excursion bilaterally Heart regular rate and rhythm, no murmur appreciated Abd soft, generalized tenderness, positive bowel sounds MSK no focal spinal tenderness,  no joint edema Neuro: non-focal, well-oriented, appropriate affect Breasts: Deferred   Lab Results  Component Value Date   WBC 7.5 01/14/2021   HGB 10.6 (L) 01/14/2021   HCT 32.0 (L) 01/14/2021   MCV 90.1 01/14/2021   PLT 126 (L) 01/14/2021   Lab Results  Component Value Date   FERRITIN 216 11/04/2017   IRON 77 11/04/2017   TIBC 261 11/04/2017   UIBC 184 11/04/2017   IRONPCTSAT 30 (L) 11/04/2017   Lab Results  Component Value Date   RBC 3.55 (L) 01/14/2021   Lab Results  Component Value Date   KAPLAMBRATIO 0.89 03/28/2016   Lab Results  Component Value Date   IGGSERUM 1,066 03/28/2016   IGMSERUM 106 03/28/2016   No results found for: Kathrynn Ducking, MSPIKE, SPEI   Chemistry      Component Value Date/Time   NA 133 (L) 12/26/2020 0001   NA 143 12/16/2016 1022   K 3.6 12/26/2020 0001   K 3.5 12/16/2016 1022   CL 98 12/26/2020 0001   CL 101 12/16/2016 1022   CO2 23 12/26/2020 0001   CO2 33 12/16/2016 1022   BUN 12 12/26/2020 0001   BUN 4 (L) 12/16/2016 1022   CREATININE 0.62 12/26/2020 0001   CREATININE 0.61 12/14/2020 0842   CREATININE 0.8 12/16/2016 1022      Component Value Date/Time   CALCIUM 8.7 (L) 12/26/2020 0001   CALCIUM 9.4 12/16/2016 1022   ALKPHOS 837 (H) 12/15/2020 0816   ALKPHOS 77 12/16/2016 1022   AST 18 12/15/2020 0816   AST 14 (L) 12/14/2020 0842   ALT 30  12/15/2020 0816   ALT 30 12/14/2020 0842   ALT 16 12/16/2016 1022   BILITOT 0.8 12/15/2020 0816   BILITOT 0.4 12/14/2020 0842       Impression and Plan: Ryan Davis is a very pleasant 60 yo African American gentleman with metastatic prostate cancer, progressive.  PSA last month was up to 168, testosterone < 3.  We will proceed with Eligard and Zometa today as planned per MD. Calcium is back up to 9.1.  He states that he is scheduled for Pluvicto in January.  Follow-up in 1 month.   Lottie Dawson, NP 12/19/20222:18 PM

## 2021-01-14 NOTE — Telephone Encounter (Signed)
Ryan Davis called to inform our office that he would need a refill sent in for his Fentanyl 64mcg patch before 12/24. [name], NP notified. All questions answered.

## 2021-01-14 NOTE — Patient Instructions (Signed)
[facility] [place]  Discharge Instructions: Thank you for choosing [facility] to provide your oncology and hematology care.   If you have a lab appointment with the [facility], please go directly to the [facility] and check in at the registration area.  Wear comfortable clothing and clothing appropriate for easy access to any Portacath or PICC line.   We strive to give you quality time with your provider. You may need to reschedule your appointment if you arrive late (15 or more minutes).  Arriving late affects you and other patients whose appointments are after yours.  Also, if you miss three or more appointments without notifying the office, you may be dismissed from the clinic at the providers discretion.      For prescription refill requests, have your pharmacy contact our office and allow 72 hours for refills to be completed.    Today you received the following chemotherapy and/or immunotherapy agents zometa, eligard    To help prevent nausea and vomiting after your treatment, we encourage you to take your nausea medication as directed.  BELOW ARE SYMPTOMS THAT SHOULD BE REPORTED IMMEDIATELY: *FEVER GREATER THAN 100.4 F (38 C) OR HIGHER *CHILLS OR SWEATING *NAUSEA AND VOMITING THAT IS NOT CONTROLLED WITH YOUR NAUSEA MEDICATION *UNUSUAL SHORTNESS OF BREATH *UNUSUAL BRUISING OR BLEEDING *URINARY PROBLEMS (pain or burning when urinating, or frequent urination) *BOWEL PROBLEMS (unusual diarrhea, constipation, pain near the anus) TENDERNESS IN MOUTH AND THROAT WITH OR WITHOUT PRESENCE OF ULCERS (sore throat, sores in mouth, or a toothache) UNUSUAL RASH, SWELLING OR PAIN  UNUSUAL VAGINAL DISCHARGE OR ITCHING   Items with * indicate a potential emergency and should be followed up as soon as possible or go to the Emergency Department if any problems should occur.  Please show the CHEMOTHERAPY ALERT CARD or IMMUNOTHERAPY ALERT CARD at check-in to  the Emergency Department and triage nurse. Should you have questions after your visit or need to cancel or reschedule your appointment, please contact [facility]  [phone] and follow the prompts.  Office hours are 8:00 a.m. to 4:30 p.m. Monday - Friday. Please note that voicemails left after 4:00 p.m. may not be returned until the following business day.  We are closed weekends and major holidays. You have access to a nurse at all times for urgent questions. Please call the main number to the clinic [phone] and follow the prompts.  For any non-urgent questions, you may also contact your provider using MyChart. We now offer e-Visits for anyone 13 and older to request care online for non-urgent symptoms. For details visit [email or web address].   Also download the MyChart app! Go to the app store, search "MyChart", open the app, select [facility], and log in with your MyChart username and password.  Due to Covid, a mask is required upon entering the hospital/clinic. If you do not have a mask, one will be given to you upon arrival. For doctor visits, patients may have 1 support person aged 49 or older with them. For treatment visits, patients cannot have anyone with them due to current Covid guidelines and our immunocompromised population.

## 2021-01-15 ENCOUNTER — Telehealth: Payer: Self-pay | Admitting: *Deleted

## 2021-01-15 LAB — TESTOSTERONE: Testosterone: <3 ng/dL — ABNORMAL LOW (ref 264–916)

## 2021-01-15 LAB — PSA, TOTAL AND FREE
PSA, Free Pct: >10.4 %
PSA, Free: >50 ng/mL
Prostate Specific Ag, Serum: 482 ng/mL — ABNORMAL HIGH (ref 0.0–4.0)

## 2021-01-15 NOTE — Telephone Encounter (Signed)
Per 01/14/21 los called and lvm of upcoming appointments - requested call back to confirm - mailed calendar

## 2021-01-17 ENCOUNTER — Inpatient Hospital Stay: Payer: Medicare Other

## 2021-01-17 NOTE — Progress Notes (Signed)
Nutrition Assessment:  Patient identified on Malnutrition Screening report for weight loss  60 year old male with metastatic prostate cancer.  Past medical history of GERD.  Patient receiving elidgard and zometa.  Spoke with patient via phone.  Patient reports appetite has improved after seeing NP on 12/16.  Says that yesterday was able to eat a whopper junior, drink 2 ensures/equate (250 calories). Eating cornflakes just a little bit ago because just got up recently.  Denies nausea but reports constipation.  Says that he is taking medications as prescribed.      Medications: movantik, senna, miralax, marinol, pepcid  Labs: reviewed  Anthropometrics:   Height: 74 inches Weight: 131 lb UBW: 165 lb October 2022 BMI: 16  21% weight loss in the last 2 months, signficiant   Estimated Energy Needs  Kcals: 1800-2100 Protein: 90-105 g Fluid: 1.8 L  NUTRITION DIAGNOSIS: Inadequate oral intake related to cancer, constipation as evidenced by 21% weight loss in the last 2 months and eating < 50% of estimated energy needs for > 5 days   INTERVENTION:  Encouraged bowel regimen compliance and touching base with NP if not working. Discussed food strategies to help with constipation Discussed ways to add calories and protein to diet. Will mail handout. Will mail coupons for ensure. Contact information will be mailed   MONITORING, EVALUATION, GOAL: weight trends, intake   NEXT VISIT: Thursday, Feb 2 phone call  Juelz Whittenberg B. Zenia Resides, [place], [place] Registered Dietitian [phone] (mobile)

## 2021-01-22 ENCOUNTER — Telehealth: Payer: Self-pay

## 2021-01-22 ENCOUNTER — Inpatient Hospital Stay: Payer: Medicare Other | Admitting: Nurse Practitioner

## 2021-01-22 NOTE — Telephone Encounter (Signed)
I called Ryan Davis to inform him of his appt today. He stated that he didn't have a ride but would be able to come tomorrow at 10:00am. I rescheduled his appt for 12/28 at 10:00am. Understanding verbalized. All questions answered.

## 2021-01-23 ENCOUNTER — Other Ambulatory Visit: Payer: Self-pay

## 2021-01-23 ENCOUNTER — Inpatient Hospital Stay (HOSPITAL_BASED_OUTPATIENT_CLINIC_OR_DEPARTMENT_OTHER): Payer: Medicare Other | Admitting: Nurse Practitioner

## 2021-01-23 VITALS — BP 101/73 | HR 107 | Temp 97.4°F | Resp 18 | Ht 74.0 in | Wt 125.6 lb

## 2021-01-23 DIAGNOSIS — R530 Neoplastic (malignant) related fatigue: Secondary | ICD-10-CM

## 2021-01-23 DIAGNOSIS — Z5111 Encounter for antineoplastic chemotherapy: Secondary | ICD-10-CM | POA: Diagnosis not present

## 2021-01-23 DIAGNOSIS — R630 Anorexia: Secondary | ICD-10-CM

## 2021-01-23 DIAGNOSIS — K5903 Drug induced constipation: Secondary | ICD-10-CM

## 2021-01-23 DIAGNOSIS — C61 Malignant neoplasm of prostate: Secondary | ICD-10-CM | POA: Diagnosis not present

## 2021-01-23 DIAGNOSIS — R531 Weakness: Secondary | ICD-10-CM

## 2021-01-23 DIAGNOSIS — R634 Abnormal weight loss: Secondary | ICD-10-CM

## 2021-01-23 DIAGNOSIS — Z515 Encounter for palliative care: Secondary | ICD-10-CM | POA: Diagnosis not present

## 2021-01-23 DIAGNOSIS — G893 Neoplasm related pain (acute) (chronic): Secondary | ICD-10-CM

## 2021-01-23 DIAGNOSIS — Z7189 Other specified counseling: Secondary | ICD-10-CM

## 2021-01-23 MED ORDER — TAMSULOSIN HCL 0.4 MG PO CAPS: 0.4000 mg | ORAL_CAPSULE | Freq: Every day | ORAL | 1 refills | Status: DC

## 2021-01-23 NOTE — Progress Notes (Signed)
[facility]  Telephone:(336) [phone] Fax:(336) [phone]   Name: RAESHAWN VO Date: 01/23/2021 MRN: [id number]  DOB: 05-09-1960  Patient Care Team: Rip Harbour as PCP - General (Internal Medicine) Pickenpack-Cousar, Carlena Sax, NP as Nurse Practitioner (Nurse Practitioner)    INTERVAL HISTORY: KIEL COCKERELL is a 60 y.o. 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  Palliative ask to see for symptom management.  SOCIAL HISTORY:     reports that he quit smoking about 8 months ago. His smoking use included cigarettes. He has a 19.50 pack-year smoking history. He has never used smokeless tobacco. He reports that he does not currently use alcohol. He reports that he does not use drugs.  ADVANCE DIRECTIVES:  Patient has a Buyer, retail. His sister [name] is primary agent and his brother Detron Carras) is secondary.   CODE STATUS:   PAST MEDICAL HISTORY: Past Medical History:  Diagnosis Date   A-fib [facility])    see 03-28-16 admission   Back pain without radiculopathy 03/28/2016   Dyspnea    endorses since treatment radiation , still gets SOB occasionally    Dysrhythmia    afib    Goals of care, counseling/discussion 06/24/2017   History of radiation therapy 09/16/16-10/06/16   35 Gy in 14 fractions to the lumbar spine   Nocturia    Prostate cancer metastatic to bone ([place]) 03/31/2016   Prostate cancer metastatic to multiple sites [facility]) 03/31/2016     HEMATOLOGY/ONCOLOGY HISTORY:  Oncology History  Prostate cancer metastatic to multiple sites [facility])  03/31/2016 Initial Diagnosis   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[place])   07/08/2017 - 07/08/2017 Chemotherapy   The patient had dexamethasone (DECADRON) 4 MG tablet, 8 mg, Oral, 2 times daily, 0 of 1 cycle, Start date:  --, End date: -- pegfilgrastim-cbqv (UDENYCA) injection 6 mg, 6 mg, Subcutaneous, Once, 0 of 4 cycles DOCEtaxel (TAXOTERE) 120 mg in sodium chloride 0.9 % 250 mL chemo infusion, 67.5 mg/m2 = 120 mg (90 % of original dose 75 mg/m2), Intravenous,  Once, 0 of 4 cycles Dose modification: 67.5 mg/m2 (90 % of original dose 75 mg/m2, Cycle 1, Reason: Provider Judgment)   for chemotherapy treatment.     08/27/2020 Cancer Staging   Staging form: Prostate, AJCC 8th Edition - Clinical stage from 08/27/2020: Stage IVB (cT2c, cNX, pM1b, PSA: 700, Grade Group: 4) - Signed by [name], MD on 08/27/2020 Prostate specific antigen (PSA) range: 20 or greater Gleason score: 8 Histologic grading system: 5 grade system    09/06/2020 - 10/22/2020 Chemotherapy   Patient is on Treatment Plan : PROSTATE Docetaxel + Prednisone q21d     Prostate cancer metastatic to bone ([place])  03/31/2016 Initial Diagnosis   Prostate cancer metastatic to bone ([place])   07/08/2017 - 07/08/2017 Chemotherapy   The patient had dexamethasone (DECADRON) 4 MG tablet, 8 mg, Oral, 2 times daily, 0 of 1 cycle, Start date: --, End date: -- pegfilgrastim-cbqv (UDENYCA) injection 6 mg, 6 mg, Subcutaneous, Once, 0 of 4 cycles DOCEtaxel (TAXOTERE) 120 mg in sodium chloride 0.9 % 250 mL chemo infusion, 67.5 mg/m2 = 120 mg (90 % of original dose 75 mg/m2), Intravenous,  Once, 0 of 4 cycles Dose modification: 67.5 mg/m2 (90 % of original dose 75 mg/m2, Cycle 1, Reason: Provider Judgment)   for chemotherapy treatment.  09/06/2020 - 10/22/2020 Chemotherapy   Patient is on Treatment Plan : PROSTATE Docetaxel + Prednisone q21d       ALLERGIES:  is allergic to contrast media [iodinated contrast media] and other.  MEDICATIONS:  Current Outpatient Medications  Medication Sig Dispense Refill   dronabinol (MARINOL) 2.5 MG capsule Take 1 capsule (2.5 mg total) by mouth 2 (two) times daily before a meal. 60 capsule 0   famotidine (PEPCID) 40 MG tablet  Take 1 tablet (40 mg total) by mouth 2 (two) times daily. 60 tablet 6   fentaNYL (DURAGESIC) 100 MCG/HR Place 2 patches onto the skin every other day. 30 patch 0   fentaNYL (DURAGESIC) 75 MCG/HR Place 1 patch onto the skin every other day. 15 patch 0   gabapentin (NEURONTIN) 800 MG tablet Take 800 mg by mouth See admin instructions. Take 400mg  (one half tablet) by mouth in the morning and 1600mg  (2 tablets) at night     metoprolol tartrate (LOPRESSOR) 25 MG tablet TAKE 1/2 TABLET(12.5 MG) BY MOUTH TWICE DAILY (Patient taking differently: Take 12.5 mg by mouth 2 (two) times daily.) 60 tablet 3   naloxegol oxalate (MOVANTIK) 12.5 MG TABS tablet Take 1 tablet (12.5 mg total) by mouth daily. (Patient not taking: Reported on 12/13/2020) 30 tablet 0   Naloxone HCl 0.4 MG/0.4ML SOAJ Use in the event of accidental overdose of opioids. (Patient not taking: Reported on 12/14/2020) 0.4 mL 0   oxycodone (ROXICODONE) 30 MG immediate release tablet Take 1 tablet (30 mg total) by mouth every 4 (four) hours as needed for pain. (Patient not taking: Reported on 01/14/2021) 90 tablet 0   pantoprazole (PROTONIX) 40 MG tablet Take 1 tablet (40 mg total) by mouth daily. (Patient taking differently: Take 40 mg by mouth daily as needed (for acid reflux).) 30 tablet 0   Plecanatide (TRULANCE) 3 MG TABS Take 3 mg by mouth daily.     polyethylene glycol (MIRALAX / GLYCOLAX) 17 g packet Take 17 g by mouth 2 (two) times daily. 60 each 1   senna (SENOKOT) 8.6 MG TABS tablet Take 2 tablets (17.2 mg total) by mouth 2 (two) times daily. 120 tablet 0   tamsulosin (FLOMAX) 0.4 MG CAPS capsule Take 1 capsule (0.4 mg total) by mouth daily. 30 capsule 1   No current facility-administered medications for this visit.   Facility-Administered Medications Ordered in Other Visits  Medication Dose Route Frequency Provider Last Rate Last Admin   SONAFINE emulsion 1 application  1 application Topical BID Gery Pray, MD        VITAL  SIGNS: BP 101/73 (BP Location: Left Arm, Patient Position: Sitting)    Pulse (!) 107    Temp (!) 97.4 F (36.3 C) (Tympanic)    Resp 18    Ht 6\' 2"  (1.88 m)    Wt 125 lb 9.6 oz (57 kg)    SpO2 99%    BMI 16.13 kg/m  Filed Weights   01/23/21 1012  Weight: 125 lb 9.6 oz (57 kg)    Estimated body mass index is 16.13 kg/m as calculated from the following:   Height as of this encounter: 6\' 2"  (1.88 m).   Weight as of this encounter: 125 lb 9.6 oz (57 kg).  LABS: CBC:    Component Value Date/Time   WBC 7.5 01/14/2021 1350   WBC 10.4 12/26/2020 0001   HGB 10.6 (L) 01/14/2021 1350   HGB 12.2 (L) 12/16/2016 1022   HCT 32.0 (L) 01/14/2021 1350  HCT 36.4 (L) 12/16/2016 1022   PLT 126 (L) 01/14/2021 1350   PLT 185 12/16/2016 1022   MCV 90.1 01/14/2021 1350   MCV 92 12/16/2016 1022   NEUTROABS 4.3 01/14/2021 1350   NEUTROABS 4.6 12/16/2016 1022   LYMPHSABS 2.6 01/14/2021 1350   LYMPHSABS 1.2 12/16/2016 1022   MONOABS 0.3 01/14/2021 1350   EOSABS 0.1 01/14/2021 1350   EOSABS 0.4 12/16/2016 1022   BASOSABS 0.0 01/14/2021 1350   BASOSABS 0.0 12/16/2016 1022   Comprehensive Metabolic Panel:    Component Value Date/Time   NA 134 (L) 01/14/2021 1350   NA 143 12/16/2016 1022   K 4.2 01/14/2021 1350   K 3.5 12/16/2016 1022   CL 100 01/14/2021 1350   CL 101 12/16/2016 1022   CO2 25 01/14/2021 1350   CO2 33 12/16/2016 1022   BUN 9 01/14/2021 1350   BUN 4 (L) 12/16/2016 1022   CREATININE 0.67 01/14/2021 1350   CREATININE 0.8 12/16/2016 1022   GLUCOSE 178 (H) 01/14/2021 1350   GLUCOSE 109 12/16/2016 1022   CALCIUM 9.1 01/14/2021 1350   CALCIUM 9.4 12/16/2016 1022   AST 15 01/14/2021 1350   ALT 16 01/14/2021 1350   ALT 16 12/16/2016 1022   ALKPHOS 587 (H) 01/14/2021 1350   ALKPHOS 77 12/16/2016 1022   BILITOT 0.4 01/14/2021 1350   PROT 6.5 01/14/2021 1350   PROT 6.7 12/16/2016 1022   ALBUMIN 4.0 01/14/2021 1350   ALBUMIN 3.7 12/16/2016 1022     PERFORMANCE STATUS (ECOG) :  1 - Symptomatic but completely ambulatory   Physical Exam General: NAD, thin  Cardiovascular: regular rate and rhythm Pulmonary: clear ant fields Abdomen: soft, nontender, + bowel sounds GU: no suprapubic tenderness Extremities: no edema, no joint deformities Skin: no rashes, warm, dry Neurological: Weakness but otherwise nonfocal  IMPRESSION: Mr. Debruin presents to clinic today supported by his friend Truman Hayward. No acute distress noted. He appears weak. Ambulatory with cane. He states he feels better however does endorse ongoing abdomen pain. He has brought in all of his medications today to review and confirm what he should or should not be taking.   Chronic pain/neoplasm related Mr. Batterton is tolerating current regimen (Fentanyl patch and Oxycodone for breakthrough). He is not having to take breakthrough Oxy as much as he has been before. States pain is generally well controlled during the day with his biggest complaint of abdominal pain that seems to escalate in the evening and night hours. I have observed his current pill count and he does not need a refill on Oxycodone as he has over a month supply available. He does have famotidine which he has not been taking but endorses drinking orange juice more than he has water. Encouraged to restart this for GI support.   Anorexia/weight loss Mr, Perotti is being followed by the Dietician. I expressed concerns for his continued weight loss despite use of Marinol. He has loss approximately 6lbs over the past 3 wweks (132 lb 12/8, 131lb 12/19, 125.9lb 12/28).   We discussed increasing dosage to 5mg  with hopes of increasing appetite. He reports his appetite waxes and wanes. Does not feel like eating much when he has abdominal pain or feels as though pain is escalated when he eats over a certain amount. We discussed small frequent meals and ways to increase protein intake. He endorses eating peanut butter and at least 1 ensure/day although shares concerns for  the cost of purchasing. I will work to see if we  can find some resources to assist in purchasing the ensure to decrease financial burden.   Constipation He had an episode of diarrhea several weeks ago. This was controlled with Imodium. He is actively taking his Movantik, senna, and Miralax (as needed). States he has a bowel movement if not daily then every other day. He is aware if he begins to experience loose stools to hold medications for 48 hrs and only restart Movantik.   We discussed Her current illness and what it means in the larger context of Her on-going co-morbidities. Natural disease trajectory and expectations were discussed. Mr. Sweetman is scheduled for follow-up with Dr. Marin Olp in 3 weeks. He shares he is seeing GI doctor and scheduled for MRI on 1/17 and possible ERCP on 1/18. He is inquiring about this being done at [facility]. Advised to contact his GI doctor and make request to see if this is something that they can accommodate.   I discussed the importance of continued conversation with family and their medical providers regarding overall plan of care and treatment options, ensuring decisions are within the context of the patients values and GOCs.  PLAN: Continue current pain regimen with Fentanyl Patch and breakthrough Oxycodone as needed.  Continue bowel regimen Patient aware to focus on increasing his nutrition, protein intake, with plans to increase Marinol to 5mg . I am concerned about his ongoing weight loss. Extensive education provided.  I will plan to have virtual visit with him in 2-3 weeks.    Patient expressed understanding and was in agreement with this plan. He also understands that He can call the clinic at any time with any questions, concerns, or complaints.     Time Total: 45 min  Visit consisted of counseling and education dealing with the complex and emotionally intense issues of symptom management and palliative care in the setting  of serious and potentially life-threatening illness.Greater than 50%  of this time was spent counseling and coordinating care related to the above assessment and plan.  Signed by: Alda Lea, AGPCNP-BC Palliative Medicine Team

## 2021-01-23 NOTE — Progress Notes (Signed)
°  Outpatient Palliative Care  (336) [phone] ________________________________ Nursing Assessment:  Name: RAYLYN SPECKMAN        MRN: [id number]  Date of Service: 01/23/2021 DOB: [birth year] Visit Type: Follow up  Support at Visit: Truman Hayward, friend  Review of Systems: General: Mr. Prestia reports fatigue and weakness. He states that he has to take naps and sometimes lays in bed for the day.  6lb weight loss since last visit. Reports sleeping "so so" and tosses and turns throughout the night. Neuro: Reports numbness/tingling in hands and feet with an increase due to the cold. Reports dizziness and blurred vision at times. Cardiac: None Pulmonary: Reports SHOB with activity.   GI: Reports a 50% normal appetite.  No N/V.  Constipation improving. Reports a BM every 1-2days. GU: Reports the feeling of needing to urinate but not being able to in the moment. He stated that he has to "pace" before he is able to urinate.  Integumentary: None Psychological: Mr. Serio reports feelings of sadness at times when he thinks about his wife and what she is going through. He shared that she had to go to the ED last night but was back at home now. Emotional listening and support provided.    Medication Changes/Additions: Mr. Hyndman brought a bag of his medications to show [place] what he has at home. We compared this with the list he is supposed to be taking and went over each medication with him. We provided him with an AVS and noted his medications and how to take them correctly. We also suggested a weekly pill box to help him take his medications more accurately. He also didn't have his Flomax medication-refill sent in per [place], NP.     Pain Review: Scale of 0-10: 8 Location: abdomen-extending to back at times  Description: cramping Frequency: intermittent-reports about every other day Relief: sleeping, heating pad    Social Review: Living Situation: lives at home with wife, Kermit Balo Support: Kermit Balo (wife), Truman Hayward  (friend), brother    Falls in the last 3 months? None  Assistive Device Use? Cane  Functional Status: Performs ADLs independently-needs assistance with managing medications and transportation   ACP Form Review: Documents in [facility]   Family/Patient Concerns: Mr. Bomberger is concerned with his abdominal pain and appetite. We went over his medications thoroughly to make sure that he is taking his medications correctly with the hope of this helping his symptoms. AVS provided with instructions.   Provider notified of assessment and patient/family concerns. Patient instructed to call with any questions or concerns.

## 2021-01-23 NOTE — Patient Instructions (Addendum)
Medication Instructions:  Famotidine 40mg - take one tablet two times a day to help with indigestion and stomach pain Dexamethasone (Decadron) 4mg - take one tablet a day for energy and appetite  Dronabinol (Marinol) 2.5mg - take two tablets in the morning and one tablet at night to help with appetite  Senokot 8.6mg - take two tablets two times a day to prevent constipation  Gabepentin 800mg - take one tablet two times a day for pain and tingling in feet Lorazepam (Ativan) 0.5mg - take one tablet three times a day as needed for pain  Bayer Aspirin 81mg - take one tablet in the morning  Tamsulosin (Flomax) 0.4mg -pick up medication at pharmacy- take one tablet a day to help with urination

## 2021-01-24 MED ORDER — TAMSULOSIN HCL 0.4 MG PO CAPS: 0.4000 mg | ORAL_CAPSULE | Freq: Every day | ORAL | 1 refills | Status: DC

## 2021-01-24 NOTE — Addendum Note (Signed)
Addended by: Jimmy Footman on: 01/24/2021 10:42 AM   Modules accepted: Orders

## 2021-01-25 ENCOUNTER — Other Ambulatory Visit: Payer: Self-pay | Admitting: Nurse Practitioner

## 2021-01-25 DIAGNOSIS — C61 Malignant neoplasm of prostate: Secondary | ICD-10-CM

## 2021-01-25 DIAGNOSIS — K5901 Slow transit constipation: Secondary | ICD-10-CM

## 2021-01-25 DIAGNOSIS — R531 Weakness: Secondary | ICD-10-CM

## 2021-01-25 DIAGNOSIS — Z515 Encounter for palliative care: Secondary | ICD-10-CM

## 2021-01-25 DIAGNOSIS — G893 Neoplasm related pain (acute) (chronic): Secondary | ICD-10-CM

## 2021-01-25 MED ORDER — FENTANYL 75 MCG/HR TD PT72: 1.0000 | MEDICATED_PATCH | TRANSDERMAL | 0 refills | Status: DC

## 2021-01-25 NOTE — Telephone Encounter (Signed)
Pharmacy notified that they are unable to gt 24mcg Fentanyl patches in timely manner. Request made to send to another pharmacy. Ryan Davis would like new RX sent to [facility] in [place].

## 2021-02-07 ENCOUNTER — Encounter (HOSPITAL_COMMUNITY): Payer: Self-pay

## 2021-02-07 ENCOUNTER — Emergency Department (HOSPITAL_COMMUNITY): Payer: Medicare Other

## 2021-02-07 ENCOUNTER — Emergency Department (HOSPITAL_COMMUNITY)
Admission: EM | Admit: 2021-02-07 | Discharge: 2021-02-07 | Disposition: A | Discharge status: Home / Self Care | Payer: Medicare Other | Attending: Emergency Medicine | Admitting: Emergency Medicine

## 2021-02-07 ENCOUNTER — Other Ambulatory Visit: Payer: Self-pay

## 2021-02-07 DIAGNOSIS — N390 Urinary tract infection, site not specified: Secondary | ICD-10-CM | POA: Diagnosis not present

## 2021-02-07 DIAGNOSIS — R1030 Lower abdominal pain, unspecified: Secondary | ICD-10-CM | POA: Diagnosis present

## 2021-02-07 LAB — URINALYSIS, ROUTINE W REFLEX MICROSCOPIC
Bilirubin Urine: NEGATIVE
Glucose, UA: NEGATIVE mg/dL
Hgb urine dipstick: NEGATIVE
Ketones, ur: NEGATIVE mg/dL
Nitrite: NEGATIVE
Protein, ur: NEGATIVE mg/dL
Specific Gravity, Urine: 1.016 (ref 1.005–1.030)
pH: 5 (ref 5.0–8.0)

## 2021-02-07 LAB — COMPREHENSIVE METABOLIC PANEL
ALT: 16 U/L (ref 0–44)
AST: 20 U/L (ref 15–41)
Albumin: 4.1 g/dL (ref 3.5–5.0)
Alkaline Phosphatase: 476 U/L — ABNORMAL HIGH (ref 38–126)
Anion gap: 9 (ref 5–15)
BUN: 9 mg/dL (ref 6–20)
CO2: 23 mmol/L (ref 22–32)
Calcium: 8.1 mg/dL — ABNORMAL LOW (ref 8.9–10.3)
Chloride: 100 mmol/L (ref 98–111)
Creatinine, Ser: 0.61 mg/dL (ref 0.61–1.24)
GFR, Estimated: >60 mL/min (ref >60)
Glucose, Bld: 140 mg/dL — ABNORMAL HIGH (ref 70–99)
Potassium: 3.2 mmol/L — ABNORMAL LOW (ref 3.5–5.1)
Sodium: 132 mmol/L — ABNORMAL LOW (ref 135–145)
Total Bilirubin: 0.6 mg/dL (ref 0.3–1.2)
Total Protein: 7.5 g/dL (ref 6.5–8.1)

## 2021-02-07 LAB — CBC WITH DIFFERENTIAL/PLATELET
Abs Immature Granulocytes: 0.53 10*3/uL — ABNORMAL HIGH (ref 0.00–0.07)
Basophils Absolute: 0 10*3/uL (ref 0.0–0.1)
Basophils Relative: 1 %
Eosinophils Absolute: 0.1 10*3/uL (ref 0.0–0.5)
Eosinophils Relative: 1 %
HCT: 29.5 % — ABNORMAL LOW (ref 39.0–52.0)
Hemoglobin: 9.7 g/dL — ABNORMAL LOW (ref 13.0–17.0)
Immature Granulocytes: 6 %
Lymphocytes Relative: 33 %
Lymphs Abs: 2.8 10*3/uL (ref 0.7–4.0)
MCH: 29.8 pg (ref 26.0–34.0)
MCHC: 32.9 g/dL (ref 30.0–36.0)
MCV: 90.8 fL (ref 80.0–100.0)
Monocytes Absolute: 0.6 10*3/uL (ref 0.1–1.0)
Monocytes Relative: 7 %
Neutro Abs: 4.3 10*3/uL (ref 1.7–7.7)
Neutrophils Relative %: 52 %
Platelets: 107 10*3/uL — ABNORMAL LOW (ref 150–400)
RBC: 3.25 MIL/uL — ABNORMAL LOW (ref 4.22–5.81)
RDW: 18.8 % — ABNORMAL HIGH (ref 11.5–15.5)
WBC: 8.3 10*3/uL (ref 4.0–10.5)
nRBC: 3.1 % — ABNORMAL HIGH (ref 0.0–0.2)

## 2021-02-07 LAB — LIPASE, BLOOD: Lipase: 20 U/L (ref 11–51)

## 2021-02-07 MED ORDER — HYDROMORPHONE HCL 1 MG/ML IJ SOLN
1.0000 mg | Freq: Once | INTRAMUSCULAR | Status: AC
  Administered 2021-02-07: 1 mg via INTRAVENOUS
  Filled 2021-02-07: qty 1

## 2021-02-07 MED ORDER — CEPHALEXIN 500 MG PO CAPS
500.0000 mg | ORAL_CAPSULE | Freq: Once | ORAL | Status: AC
  Administered 2021-02-07: 500 mg via ORAL
  Filled 2021-02-07: qty 1

## 2021-02-07 MED ORDER — SODIUM CHLORIDE 0.9 % IV BOLUS
500.0000 mL | Freq: Once | INTRAVENOUS | Status: AC
  Administered 2021-02-07: 500 mL via INTRAVENOUS

## 2021-02-07 MED ORDER — CEPHALEXIN 500 MG PO CAPS: 500.0000 mg | ORAL_CAPSULE | Freq: Four times a day (QID) | ORAL | 0 refills | Status: DC

## 2021-02-07 MED ORDER — OXYCODONE HCL 5 MG PO TABS
10.0000 mg | ORAL_TABLET | Freq: Once | ORAL | Status: AC
  Administered 2021-02-07: 10 mg via ORAL
  Filled 2021-02-07: qty 2

## 2021-02-07 MED ORDER — HEPARIN SOD (PORK) LOCK FLUSH 100 UNIT/ML IV SOLN
500.0000 [IU] | Freq: Once | INTRAVENOUS | Status: AC
  Administered 2021-02-07: 500 [IU]
  Filled 2021-02-07: qty 5

## 2021-02-07 NOTE — ED Notes (Signed)
Pt aware urine sample is needed 

## 2021-02-07 NOTE — ED Triage Notes (Signed)
Pt has a hx of metastatic prostate ca. Pt states that he is going to call his doctor in the morning. Pt is on palliative care and last took his medication 30 mins before he arrived via EMS.

## 2021-02-07 NOTE — ED Triage Notes (Signed)
Pt BIB EMS from home with reports of lower abdominal pain that radiates to the lower back x 3 hrs.   126/92 100 hr  16 r  129 cbg

## 2021-02-07 NOTE — ED Provider Notes (Signed)
[facility] DEPT Provider Note   CSN: [id number] Arrival date & time: 02/07/21  0431     History  Chief Complaint  Patient presents with   Abdominal Pain    Ryan Davis is a 61 y.o. male.  61 year old male with prior medical history as detailed below presents for evaluation.  Patient complains of diffuse lower abdominal discomfort and pain.  Patient with prior history significant for metastatic prostate cancer.  Patient has chronic pain and needs and takes narcotics regularly.  He reports onset of lower abdominal discomfort and pain overnight.  He denies nausea or vomiting.  He denies fever.  He reports that he has been taking his regular pain medications without significant control of his pain.  The history is provided by the patient.  Abdominal Pain Pain location:  Suprapubic Pain quality: aching   Pain radiates to:  Does not radiate Pain severity:  Mild Onset quality:  Gradual Duration:  1 day Timing:  Constant Progression:  Waxing and waning     Home Medications Prior to Admission medications   Medication Sig Start Date End Date Taking? Authorizing Provider  cephALEXin (KEFLEX) 500 MG capsule Take 1 capsule (500 mg total) by mouth 4 (four) times daily. 02/07/21  Yes Valarie Merino, MD  dronabinol (MARINOL) 2.5 MG capsule Take 1 capsule (2.5 mg total) by mouth 2 (two) times daily before a meal. 01/03/21   Pickenpack-Cousar, Carlena Sax, NP  famotidine (PEPCID) 40 MG tablet Take 1 tablet (40 mg total) by mouth 2 (two) times daily. 09/27/20   [name], MD  fentaNYL (DURAGESIC) 75 MCG/HR Place 1 patch onto the skin every other day. 01/25/21   Pickenpack-Cousar, Carlena Sax, NP  gabapentin (NEURONTIN) 800 MG tablet Take 800 mg by mouth See admin instructions. Take 400mg  (one half tablet) by mouth in the morning and 1600mg  (2 tablets) at night    [provider]  metoprolol tartrate (LOPRESSOR) 25 MG tablet TAKE 1/2 TABLET(12.5 MG) BY  MOUTH TWICE DAILY Patient taking differently: Take 12.5 mg by mouth 2 (two) times daily. 09/24/20   [name], MD  naloxegol oxalate (MOVANTIK) 12.5 MG TABS tablet Take 1 tablet (12.5 mg total) by mouth daily. Patient not taking: Reported on 12/13/2020 12/13/20   Pickenpack-Cousar, Carlena Sax, NP  Naloxone HCl 0.4 MG/0.4ML SOAJ Use in the event of accidental overdose of opioids. Patient not taking: Reported on 12/14/2020 08/26/20   Arnaldo Natal, MD  oxycodone (ROXICODONE) 30 MG immediate release tablet Take 1 tablet (30 mg total) by mouth every 4 (four) hours as needed for pain. Patient not taking: Reported on 01/14/2021 01/01/21   Pickenpack-Cousar, Carlena Sax, NP  pantoprazole (PROTONIX) 40 MG tablet Take 1 tablet (40 mg total) by mouth daily. Patient taking differently: Take 40 mg by mouth daily as needed (for acid reflux). 10/21/20 10/21/21  Shelly Coss, MD  Plecanatide (TRULANCE) 3 MG TABS Take 3 mg by mouth daily. 10/24/20   [provider]  polyethylene glycol (MIRALAX / GLYCOLAX) 17 g packet Take 17 g by mouth 2 (two) times daily. 10/21/20   Shelly Coss, MD  senna (SENOKOT) 8.6 MG TABS tablet Take 2 tablets (17.2 mg total) by mouth 2 (two) times daily. 10/21/20   Shelly Coss, MD  tamsulosin (FLOMAX) 0.4 MG CAPS capsule Take 1 capsule (0.4 mg total) by mouth daily. 01/24/21   Pickenpack-Cousar, Carlena Sax, NP  prochlorperazine (COMPAZINE) 10 MG tablet Take 1 tablet (10 mg total) by mouth  every 6 (six) hours as needed (Nausea or vomiting). Patient not taking: Reported on 11/12/2020 09/06/20 11/12/20  [name], MD      Allergies    Contrast media [iodinated contrast media] and Other    Review of Systems   Review of Systems  Gastrointestinal:  Positive for abdominal pain.  All other systems reviewed and are negative.  Physical Exam Updated Vital Signs BP (!) 163/98    Pulse 84    Temp 98.4 F (36.9 C) (Oral)    Resp 18    Ht 6\' 2"  (1.88 m)    Wt 57.6 kg    SpO2  100%    BMI 16.31 kg/m  Physical Exam Vitals and nursing note reviewed.  Constitutional:      General: He is not in acute distress.    Appearance: Normal appearance.     Comments: Chronically ill, cachectic  HENT:     Head: Normocephalic and atraumatic.  Eyes:     Conjunctiva/sclera: Conjunctivae normal.     Pupils: Pupils are equal, round, and reactive to light.  Cardiovascular:     Rate and Rhythm: Normal rate and regular rhythm.     Heart sounds: Normal heart sounds.  Pulmonary:     Effort: Pulmonary effort is normal. No respiratory distress.     Breath sounds: Normal breath sounds.  Abdominal:     General: There is no distension.     Palpations: Abdomen is soft.     Tenderness: There is no abdominal tenderness.  Musculoskeletal:        General: No deformity. Normal range of motion.     Cervical back: Normal range of motion and neck supple.  Skin:    General: Skin is warm and dry.  Neurological:     General: No focal deficit present.     Mental Status: He is alert and oriented to person, place, and time.    ED Results / Procedures / Treatments   Labs (all labs ordered are listed, but only abnormal results are displayed) Labs Reviewed  CBC WITH DIFFERENTIAL/PLATELET - Abnormal; Notable for the following components:      Result Value   RBC 3.25 (*)    Hemoglobin 9.7 (*)    HCT 29.5 (*)    RDW 18.8 (*)    Platelets 107 (*)    nRBC 3.1 (*)    Abs Immature Granulocytes 0.53 (*)    All other components within normal limits  COMPREHENSIVE METABOLIC PANEL - Abnormal; Notable for the following components:   Sodium 132 (*)    Potassium 3.2 (*)    Glucose, Bld 140 (*)    Calcium 8.1 (*)    Alkaline Phosphatase 476 (*)    All other components within normal limits  URINALYSIS, ROUTINE W REFLEX MICROSCOPIC - Abnormal; Notable for the following components:   Leukocytes,Ua MODERATE (*)    Bacteria, UA RARE (*)    All other components within normal limits  LIPASE, BLOOD     EKG None  Radiology CT ABDOMEN PELVIS WO CONTRAST  Result Date: 02/07/2021 CLINICAL DATA:  Abdominal pain. Nonlocalized. Metastatic prostate carcinoma with extensive bone metastasis. Patient under palliative care for metastatic prostate cancer. EXAM: CT ABDOMEN AND PELVIS WITHOUT CONTRAST TECHNIQUE: Multidetector CT imaging of the abdomen and pelvis was performed following the standard protocol without IV contrast. RADIATION DOSE REDUCTION: This exam was performed according to the departmental dose-optimization program which includes automated exposure control, adjustment of the mA and/or kV according to  patient size and/or use of iterative reconstruction technique. COMPARISON:  CT abdomen pelvis 12/04/2020 FINDINGS: Lower chest: Lung bases are clear. Hepatobiliary: No focal hepatic lesion. No biliary duct dilatation. Common bile duct is normal. Pancreas: Pancreas is normal. No ductal dilatation. No pancreatic inflammation. Stable cyst in the pancreatic tail measuring 3.3 cm. Spleen: Normal Adrenals/urinary tract: Adrenal glands and kidneys are normal. The ureters and bladder normal. Stomach/Bowel: Stomach, small bowel and colon unremarkable. Difficult to assess intra-abdominal contents due to no IV contrast and very little intraperitoneal fat. No evidence of bowel obstruction or inflammation. Moderate volume stool in the rectum. Vascular/Lymphatic: Abdominal aorta is normal caliber. No periportal or retroperitoneal adenopathy. No pelvic adenopathy. Reproductive: Unremarkable Other: No free fluid or free air. Musculoskeletal: Extensive diffuse sclerotic metastasis IMPRESSION: 1. No acute abdominopelvic findings. Evaluation of intra-abdominal contents somewhat difficult due to very little intraperitoneal fat and no IV contrast. 2. No bowel obstruction.  Moderate volume stool in the rectum. 3. Diffuse sclerotic skeletal metastasis unchanged Electronically Signed   By: Suzy Bouchard M.D.   On: 02/07/2021  08:34    Procedures Procedures    Medications Ordered in ED Medications  cephALEXin (KEFLEX) capsule 500 mg (has no administration in time range)  HYDROmorphone (DILAUDID) injection 1 mg (has no administration in time range)  oxyCODONE (Oxy IR/ROXICODONE) immediate release tablet 10 mg (10 mg Oral Given 02/07/21 0459)  HYDROmorphone (DILAUDID) injection 1 mg (1 mg Intravenous Given 02/07/21 0748)  HYDROmorphone (DILAUDID) injection 1 mg (1 mg Intravenous Given 02/07/21 0918)  sodium chloride 0.9 % bolus 500 mL (0 mLs Intravenous Stopped 02/07/21 1104)    ED Course/ Medical Decision Making/ A&P                           Medical Decision Making   Medical Screen Complete  This patient presented to the ED with complaint of abdominal pain.  This complaint involves an extensive number of treatment options. The initial differential diagnosis includes, but is not limited to, intra-abdominal infection, obstruction, metastatic pathology, UTI, metabolic abnormality,etc  This presentation is: Acute, Chronic, Self-Limited, Previously Undiagnosed, Uncertain Prognosis, Complicated, Systemic Symptoms, and Threat to Life/Bodily Function  Patient presented with complaint of abdominal discomfort.  Patient on narcotics for chronic pain control.  Evaluation is not strongly suggestive of significant acute pathology, however, given patient's use of narcotics regularly will obtain additional imaging and work-up.  CT imaging is without evidence of acute pathology.  Obtained screening labs are without significant abnormality other than possible early UTI.  Patient does feel improved after ED evaluation.  He now desires DC home.  He does understand need for close follow-up.  He was offered additional observation here in the ED but he declines.  He will be prescribed a course of antibiotics for treatment of possible early UTI.  Importance of close follow-up is stressed.   Co morbidities that complicated  the patient's evaluation  Metastatic cancer, chronic pain   Additional history obtained:   External records from outside sources obtained and reviewed including prior ED visits and prior Inpatient records.    Lab Tests:  I ordered and personally interpreted labs.  The pertinent results include: Labs obtained are without significant acute abnormality except for UA with moderate leukocytes and rare bacteria.  White count 8.3.  Creatinine 0.6.  Potassium is mildly low at 3.2.   Imaging Studies ordered:  I ordered imaging studies including CT abdomen pelvis I independently visualized and interpreted  obtained imaging which showed no acute pathology I agree with the radiologist interpretation.   Cardiac Monitoring:  The patient was maintained on a cardiac monitor.  I personally viewed and interpreted the cardiac monitor which showed an underlying rhythm of: Sinus rhythm   Medicines ordered:  I ordered medication including Keflex for likely UTI Reevaluation of the patient after these medicines showed that the patient: improved     Problem List / ED Course:  Abdominal pain, possible UTI   Reevaluation:  After the interventions noted above, I reevaluated the patient and found that they have: improved   Dispostion:  After consideration of the diagnostic results and the patients response to treatment, I feel that the patent would benefit from close outpatient follow-up.          Final Clinical Impression(s) / ED Diagnoses Final diagnoses:  Urinary tract infection without hematuria, site unspecified    Rx / DC Orders ED Discharge Orders          Ordered    cephALEXin (KEFLEX) 500 MG capsule  4 times daily        02/07/21 1149              Valarie Merino, MD 02/07/21 1218

## 2021-02-07 NOTE — ED Provider Triage Note (Signed)
Emergency Medicine Provider Triage Evaluation Note  BAER HINTON , a 61 y.o. male  was evaluated in triage.  Pt complains of abd pain. H/o metastatic cancer. Has had worsening pain since 12. Taking home oxycodone and fentanyl without improvement. Followed by palliative.   Review of Systems  Positive: Abd pain, back pain Negative: fever  Physical Exam  BP (!) 161/91 (BP Location: Right Arm)    Pulse (!) 103    Temp 98.4 F (36.9 C) (Oral)    Resp 17    Ht 6\' 2"  (1.88 m)    Wt 57.6 kg    SpO2 100%    BMI 16.31 kg/m  Gen:   Awake, no distress   Resp:  Normal effort  MSK:   Moves extremities without difficulty  Other:  Ttp of mid back and lower abd.   Medical Decision Making  Medically screening exam initiated at 4:49 AM.  Appropriate orders placed.  TAYVIN PRESLAR was informed that the remainder of the evaluation will be completed by another provider, this initial triage assessment does not replace that evaluation, and the importance of remaining in the ED until their evaluation is complete.  Labs, pain control   Franchot Heidelberg, PA-C 02/07/21 0932

## 2021-02-07 NOTE — ED Notes (Signed)
Pt refuse labs in triage state he will wait til he get room in back for labs to come from port

## 2021-02-07 NOTE — Discharge Instructions (Signed)
Return for any problem.   Take Keflex as prescribed for treatment of possible urinary tract infection.

## 2021-02-14 ENCOUNTER — Inpatient Hospital Stay: Payer: Medicare Other | Attending: Hematology & Oncology | Admitting: Nurse Practitioner

## 2021-02-14 ENCOUNTER — Encounter: Payer: Self-pay | Admitting: Nurse Practitioner

## 2021-02-14 DIAGNOSIS — G620 Drug-induced polyneuropathy: Secondary | ICD-10-CM | POA: Diagnosis not present

## 2021-02-14 DIAGNOSIS — K5901 Slow transit constipation: Secondary | ICD-10-CM

## 2021-02-14 DIAGNOSIS — C61 Malignant neoplasm of prostate: Secondary | ICD-10-CM | POA: Diagnosis present

## 2021-02-14 DIAGNOSIS — G893 Neoplasm related pain (acute) (chronic): Secondary | ICD-10-CM

## 2021-02-14 DIAGNOSIS — Z87891 Personal history of nicotine dependence: Secondary | ICD-10-CM | POA: Diagnosis not present

## 2021-02-14 DIAGNOSIS — R630 Anorexia: Secondary | ICD-10-CM | POA: Insufficient documentation

## 2021-02-14 DIAGNOSIS — R531 Weakness: Secondary | ICD-10-CM | POA: Diagnosis not present

## 2021-02-14 DIAGNOSIS — Z515 Encounter for palliative care: Secondary | ICD-10-CM

## 2021-02-14 MED ORDER — OXYCODONE HCL 30 MG PO TABS: 30.0000 mg | ORAL_TABLET | ORAL | 0 refills | Status: DC | PRN

## 2021-02-14 MED ORDER — DRONABINOL 5 MG PO CAPS: 5.0000 mg | ORAL_CAPSULE | Freq: Two times a day (BID) | ORAL | 0 refills | Status: DC

## 2021-02-14 MED ORDER — LACTULOSE 10 GM/15ML PO SOLN: 10.0000 g | Freq: Every day | ORAL | 0 refills | Status: DC | PRN

## 2021-02-14 MED ORDER — NALOXEGOL OXALATE 12.5 MG PO TABS: 12.5000 mg | ORAL_TABLET | Freq: Every day | ORAL | 0 refills | Status: DC

## 2021-02-14 NOTE — Progress Notes (Signed)
[facility]  [phone] [phone]   Name: Ryan Davis Date: 02/14/2021 MRN: [id number]  DOB: [birth year]  Patient Care Team: Rip Harbour as PCP - General (Internal Medicine) Pickenpack-Cousar, Carlena Sax, NP as Nurse Practitioner (Nurse Practitioner)   I connected with Oren Bracket on 02/14/21 at  9:00 AM EST by phone and verified that I am speaking with the correct person using two identifiers.   I discussed the limitations, risks, security and privacy concerns of performing an evaluation and management service by telemedicine and the availability of in-person appointments. I also discussed with the patient that there may be a patient responsible charge related to this service. The patient expressed understanding and agreed to proceed.   Other persons participating in the visit and their role in the encounter: N/A   Patients location: Sister's house  Providers location: [place]: KWELI GRASSEL is a 61 y.o. male with  multiple medical problems including metastatic prostate cancer to multiple sites including bone, chronic pain secondary to metastatic disease, paroxysmal AF not on anticoagulation.  Palliative ask to see for symptom management.  SOCIAL HISTORY:     reports that he quit smoking about 9 months ago. His smoking use included cigarettes. He has a 19.50 pack-year smoking history. He has never used smokeless tobacco. He reports that he does not currently use alcohol. He reports that he does not use drugs.  ADVANCE DIRECTIVES:  Patient has a Buyer, retail. His sister [name] is primary agent and his brother Narada Uzzle) is secondary.   CODE STATUS:   PAST MEDICAL HISTORY: Past Medical History:  Diagnosis Date   A-fib [facility])    see 03-28-16 admission   Back pain without radiculopathy 03/28/2016   Dyspnea    endorses since treatment radiation , still  gets SOB occasionally    Dysrhythmia    afib    Goals of care, counseling/discussion 06/24/2017   History of radiation therapy 09/16/16-10/06/16   35 Gy in 14 fractions to the lumbar spine   Nocturia    Prostate cancer metastatic to bone (Ryan Davis) 03/31/2016   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Ryan Davis) 03/31/2016    ALLERGIES:  is allergic to contrast media [iodinated contrast media] and other.  MEDICATIONS:  Current Outpatient Medications  Medication Sig Dispense Refill   cephALEXin (KEFLEX) 500 MG capsule Take 1 capsule (500 mg total) by mouth 4 (four) times daily. 28 capsule 0   dronabinol (MARINOL) 5 MG capsule Take 1 capsule (5 mg total) by mouth 2 (two) times daily before a meal. 60 capsule 0   famotidine (PEPCID) 40 MG tablet Take 1 tablet (40 mg total) by mouth 2 (two) times daily. 60 tablet 6   fentaNYL (DURAGESIC) 75 MCG/HR Place 1 patch onto the skin every other day. 15 patch 0   gabapentin (NEURONTIN) 800 MG tablet Take 800 mg by mouth See admin instructions. Take 400mg  (one half tablet) by mouth in the morning and 1600mg  (2 tablets) at night     metoprolol tartrate (LOPRESSOR) 25 MG tablet TAKE 1/2 TABLET(12.5 MG) BY MOUTH TWICE DAILY (Patient taking differently: Take 12.5 mg by mouth 2 (two) times daily.) 60 tablet 3   naloxegol oxalate (MOVANTIK) 12.5 MG TABS tablet Take 1 tablet (12.5 mg total) by mouth daily. 30 tablet 0   Naloxone HCl 0.4 MG/0.4ML SOAJ Use in the event of accidental overdose of opioids. (Patient  not taking: Reported on 12/14/2020) 0.4 mL 0   oxycodone (ROXICODONE) 30 MG immediate release tablet Take 1 tablet (30 mg total) by mouth every 4 (four) hours as needed for pain. 90 tablet 0   pantoprazole (PROTONIX) 40 MG tablet Take 1 tablet (40 mg total) by mouth daily. (Patient taking differently: Take 40 mg by mouth daily as needed (for acid reflux).) 30 tablet 0   Plecanatide (TRULANCE) 3 MG TABS Take 3 mg by mouth daily.     polyethylene glycol (MIRALAX /  GLYCOLAX) 17 g packet Take 17 g by mouth 2 (two) times daily. 60 each 1   senna (SENOKOT) 8.6 MG TABS tablet Take 2 tablets (17.2 mg total) by mouth 2 (two) times daily. 120 tablet 0   tamsulosin (FLOMAX) 0.4 MG CAPS capsule Take 1 capsule (0.4 mg total) by mouth daily. 90 capsule 1   No current facility-administered medications for this visit.   Facility-Administered Medications Ordered in Other Visits  Medication Dose Route Frequency Provider Last Rate Last Admin   SONAFINE emulsion 1 application  1 application Topical BID Gery Pray, MD        VITAL SIGNS: There were no vitals taken for this visit. There were no vitals filed for this visit.  Estimated body mass index is 16.31 kg/m as calculated from the following:   Height as of 02/07/21: 6\' 2"  (1.88 m).   Weight as of 02/07/21: 127 lb (57.6 kg).   PERFORMANCE STATUS (ECOG) : 1 - Symptomatic but completely ambulatory   IMPRESSION:  I spoke with [name] via phone for follow-up on symptoms. He shares he is now staying with his sister in [place].  He was having increased weakness and poor appetite in the home and his sister suggested coming to stay with her so that she could assist him in his care.  Rontrell reports he is appreciative of her offering and he seems to be doing much better since staying with her.   Chronic pain/neoplasm related Mr. Unrein is tolerating current regimen (Fentanyl patch and Oxycodone for breakthrough). He is not having to take breakthrough Oxy as much as he has been before.  Reports his pain is controlled overall.  States his biggest concern is his abdominal discomfort which also contributes to his decreased appetite.  Has not had a bowel movement in almost 7-8 days.  Education provided on strict bowel regimen in the setting of opioid use and cancer.  Patient is aware his constipation is most likely contributing to his increased abdominal discomfort.  He is passing gas.  No changes made today.  2.  Anorexia/weight loss Mr, Bitting is being followed by the Dietician.  He states that his appetite is somewhat improved since staying with his sister.  Reports weighing himself and has gained about 3-4 pounds.  States she is cooking home-cooked meals and he is able to tolerate for the most part but again with emphasis on abdominal discomfort.  Denies nausea.  Is tolerating mirtazapine.  Discussed increasing dose to increase effectiveness.   Constipation Mr. Michelsen reports he is extremely constipated. We reviewed his bowel regimen and states he has been taking all medications with minimal relief. Has had no bowel movement in 7/8 days. I advised this could also be contributing to his abdominal pain and appetite. He verbalized understanding. Will try lactulose as needed to facility bowel movement. He acknowledges regimen and will continue once he has successfully had a large stool.    I discussed the importance of  continued conversation with family and their medical providers regarding overall plan of care and treatment options, ensuring decisions are within the context of the patients values and GOCs.  PLAN: Pain is well controlled.  Continue fentanyl 75 mcg patch. Oxycodone as needed for breakthrough pain Patient reports concerns with constipation.  Extensive education provided on bowel regimen and adherence.  We will call in lactulose daily as needed to facilitate a bowel movement. Movantik daily Will increase his Marinol to 5 mg.  Reports some improvement in his appetite since moving in with his sister. I will plan to see her back in 3-4 weeks.   Patient expressed understanding and was in agreement with this plan. He also understands that He can call the clinic at any time with any questions, concerns, or complaints.   Time Total: 40 min  Visit consisted of counseling and education dealing with the complex and emotionally intense issues of symptom management and palliative care in the setting of  serious and potentially life-threatening illness.Greater than 50%  of this time was spent counseling and coordinating care related to the above assessment and plan.  Signed by: Alda Lea, AGPCNP-BC [place]

## 2021-02-15 ENCOUNTER — Telehealth: Payer: Self-pay | Admitting: Nurse Practitioner

## 2021-02-15 NOTE — Telephone Encounter (Signed)
Scheduled per 1/19 los, pt has been called and confirmed appt, will also mail calender per pt request

## 2021-02-19 ENCOUNTER — Inpatient Hospital Stay: Payer: Medicare Other

## 2021-02-19 ENCOUNTER — Inpatient Hospital Stay: Payer: Medicare Other | Admitting: Hematology & Oncology

## 2021-02-21 ENCOUNTER — Emergency Department (HOSPITAL_COMMUNITY)
Admission: EM | Admit: 2021-02-21 | Discharge: 2021-02-21 | Disposition: A | Discharge status: Home / Self Care | Payer: Medicare Other | Attending: Emergency Medicine | Admitting: Emergency Medicine

## 2021-02-21 ENCOUNTER — Other Ambulatory Visit: Payer: Self-pay

## 2021-02-21 ENCOUNTER — Encounter (HOSPITAL_COMMUNITY): Payer: Self-pay

## 2021-02-21 ENCOUNTER — Emergency Department (HOSPITAL_COMMUNITY): Payer: Medicare Other

## 2021-02-21 DIAGNOSIS — R1084 Generalized abdominal pain: Secondary | ICD-10-CM | POA: Insufficient documentation

## 2021-02-21 DIAGNOSIS — Z79899 Other long term (current) drug therapy: Secondary | ICD-10-CM | POA: Diagnosis not present

## 2021-02-21 DIAGNOSIS — Z8546 Personal history of malignant neoplasm of prostate: Secondary | ICD-10-CM | POA: Insufficient documentation

## 2021-02-21 LAB — LACTIC ACID, PLASMA: Lactic Acid, Venous: 1.3 mmol/L (ref 0.5–1.9)

## 2021-02-21 LAB — CBC WITH DIFFERENTIAL/PLATELET
Abs Immature Granulocytes: 0.46 10*3/uL — ABNORMAL HIGH (ref 0.00–0.07)
Basophils Absolute: 0 10*3/uL (ref 0.0–0.1)
Basophils Relative: 0 %
Eosinophils Absolute: 0.1 10*3/uL (ref 0.0–0.5)
Eosinophils Relative: 1 %
HCT: 27.8 % — ABNORMAL LOW (ref 39.0–52.0)
Hemoglobin: 9.4 g/dL — ABNORMAL LOW (ref 13.0–17.0)
Immature Granulocytes: 5 %
Lymphocytes Relative: 33 %
Lymphs Abs: 2.9 10*3/uL (ref 0.7–4.0)
MCH: 30.5 pg (ref 26.0–34.0)
MCHC: 33.8 g/dL (ref 30.0–36.0)
MCV: 90.3 fL (ref 80.0–100.0)
Monocytes Absolute: 0.9 10*3/uL (ref 0.1–1.0)
Monocytes Relative: 10 %
Neutro Abs: 4.5 10*3/uL (ref 1.7–7.7)
Neutrophils Relative %: 51 %
Platelets: 86 10*3/uL — ABNORMAL LOW (ref 150–400)
RBC: 3.08 MIL/uL — ABNORMAL LOW (ref 4.22–5.81)
RDW: 20.4 % — ABNORMAL HIGH (ref 11.5–15.5)
WBC: 8.9 10*3/uL (ref 4.0–10.5)
nRBC: 3.8 % — ABNORMAL HIGH (ref 0.0–0.2)

## 2021-02-21 LAB — COMPREHENSIVE METABOLIC PANEL
ALT: 19 U/L (ref 0–44)
AST: 17 U/L (ref 15–41)
Albumin: 4.1 g/dL (ref 3.5–5.0)
Alkaline Phosphatase: 619 U/L — ABNORMAL HIGH (ref 38–126)
Anion gap: 11 (ref 5–15)
BUN: 7 mg/dL (ref 6–20)
CO2: 23 mmol/L (ref 22–32)
Calcium: 8.8 mg/dL — ABNORMAL LOW (ref 8.9–10.3)
Chloride: 102 mmol/L (ref 98–111)
Creatinine, Ser: 0.48 mg/dL — ABNORMAL LOW (ref 0.61–1.24)
GFR, Estimated: >60 mL/min (ref >60)
Glucose, Bld: 121 mg/dL — ABNORMAL HIGH (ref 70–99)
Potassium: 3.1 mmol/L — ABNORMAL LOW (ref 3.5–5.1)
Sodium: 136 mmol/L (ref 135–145)
Total Bilirubin: 0.7 mg/dL (ref 0.3–1.2)
Total Protein: 7.4 g/dL (ref 6.5–8.1)

## 2021-02-21 LAB — LIPASE, BLOOD: Lipase: 23 U/L (ref 11–51)

## 2021-02-21 MED ORDER — LIDOCAINE VISCOUS HCL 2 % MT SOLN
15.0000 mL | Freq: Once | OROMUCOSAL | Status: AC
  Administered 2021-02-21: 15 mL via ORAL
  Filled 2021-02-21: qty 15

## 2021-02-21 MED ORDER — HYDROMORPHONE HCL 1 MG/ML IJ SOLN
1.0000 mg | Freq: Once | INTRAMUSCULAR | Status: AC
  Administered 2021-02-21: 1 mg via INTRAVENOUS
  Filled 2021-02-21: qty 1

## 2021-02-21 MED ORDER — SODIUM CHLORIDE 0.9 % IV BOLUS
1000.0000 mL | Freq: Once | INTRAVENOUS | Status: AC
  Administered 2021-02-21: 1000 mL via INTRAVENOUS

## 2021-02-21 MED ORDER — SODIUM CHLORIDE 0.9 % IV BOLUS
500.0000 mL | Freq: Once | INTRAVENOUS | Status: AC
  Administered 2021-02-21: 500 mL via INTRAVENOUS

## 2021-02-21 MED ORDER — ALUM & MAG HYDROXIDE-SIMETH 200-200-20 MG/5ML PO SUSP
30.0000 mL | Freq: Once | ORAL | Status: AC
  Administered 2021-02-21: 30 mL via ORAL
  Filled 2021-02-21: qty 30

## 2021-02-21 NOTE — ED Triage Notes (Signed)
Patient is here tonight with complaints of back and abdominal pain. Pain is a 10 on a 1-10 pain scale. Patient has two fentanyl patches on.

## 2021-02-21 NOTE — ED Provider Notes (Signed)
[facility] DEPT Provider Note  CSN: [id number] Arrival date & time: 02/21/21 0038  Chief Complaint(s) Abdominal Pain  HPI Ryan Davis is a 61 y.o. 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  It is generalized.  Started earlier today.  Reports that it might have been something that he ate which she should not have but cannot be specific.  Pain is severe aching.  No nausea or vomiting.  No diarrhea.  Reports that he has not voided in the last several hours.  The history is provided by the patient.   Past Medical History Past Medical History:  Diagnosis Date   A-fib [facility])    see 03-28-16 admission   Back pain without radiculopathy 03/28/2016   Dyspnea    endorses since treatment radiation , still gets SOB occasionally    Dysrhythmia    afib    Goals of care, counseling/discussion 06/24/2017   History of radiation therapy 09/16/16-10/06/16   35 Gy in 14 fractions to the lumbar spine   Nocturia    Prostate cancer metastatic to bone ([place]) 03/31/2016   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[place]) 03/31/2016   Patient Active Problem List   Diagnosis Date Noted   Intractable abdominal pain 12/04/2020   Hypokalemia due to excessive gastrointestinal loss of potassium 12/04/2020   Dilation of biliary tract 12/04/2020   Abnormal LFTs 12/04/2020   GERD without esophagitis 12/04/2020   Abdominal pain 10/18/2020   Chronic pain syndrome 12/02/2017   Goals of care, counseling/discussion 06/24/2017   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[place]) 03/31/2016   Prostate cancer metastatic to bone ([place]) 03/31/2016   AF (paroxysmal atrial fibrillation) ([place]) 03/30/2016   Back pain without radiculopathy 03/28/2016   Atrial fibrillation with RVR ([place]) 03/28/2016   Home Medication(s) Prior to Admission medications   Medication Sig Start Date  End Date Taking? Authorizing Provider  cephALEXin (KEFLEX) 500 MG capsule Take 1 capsule (500 mg total) by mouth 4 (four) times daily. 02/07/21   Valarie Merino, MD  dronabinol (MARINOL) 5 MG capsule Take 1 capsule (5 mg total) by mouth 2 (two) times daily before a meal. 02/14/21   Pickenpack-Cousar, Carlena Sax, NP  famotidine (PEPCID) 40 MG tablet Take 1 tablet (40 mg total) by mouth 2 (two) times daily. 09/27/20   [name], MD  fentaNYL (DURAGESIC) 75 MCG/HR Place 1 patch onto the skin every other day. 01/25/21   Pickenpack-Cousar, Carlena Sax, NP  gabapentin (NEURONTIN) 800 MG tablet Take 800 mg by mouth See admin instructions. Take 400mg  (one half tablet) by mouth in the morning and 1600mg  (2 tablets) at night    [provider]  lactulose (CHRONULAC) 10 GM/15ML solution Take 15 mLs (10 g total) by mouth daily as needed for mild constipation, severe constipation or moderate constipation. 02/14/21   Pickenpack-Cousar, Carlena Sax, NP  metoprolol tartrate (LOPRESSOR) 25 MG tablet TAKE 1/2 TABLET(12.5 MG) BY MOUTH TWICE DAILY Patient taking differently: Take 12.5 mg by mouth 2 (two) times daily. 09/24/20   [name], MD  naloxegol oxalate (MOVANTIK) 12.5 MG TABS tablet Take 1 tablet (12.5 mg total) by mouth daily. 02/14/21   Pickenpack-Cousar, Carlena Sax, NP  Naloxone HCl 0.4 MG/0.4ML SOAJ Use in the event of accidental overdose of opioids. Patient not taking: Reported on 12/14/2020 08/26/20   Arnaldo Natal, MD  oxycodone (ROXICODONE) 30 MG  immediate release tablet Take 1 tablet (30 mg total) by mouth every 4 (four) hours as needed for pain. 02/14/21   Pickenpack-Cousar, Carlena Sax, NP  pantoprazole (PROTONIX) 40 MG tablet Take 1 tablet (40 mg total) by mouth daily. Patient taking differently: Take 40 mg by mouth daily as needed (for acid reflux). 10/21/20 10/21/21  Shelly Coss, MD  Plecanatide (TRULANCE) 3 MG TABS Take 3 mg by mouth daily. 10/24/20   [provider]  polyethylene  glycol (MIRALAX / GLYCOLAX) 17 g packet Take 17 g by mouth 2 (two) times daily. 10/21/20   Shelly Coss, MD  senna (SENOKOT) 8.6 MG TABS tablet Take 2 tablets (17.2 mg total) by mouth 2 (two) times daily. 10/21/20   Shelly Coss, MD  tamsulosin (FLOMAX) 0.4 MG CAPS capsule Take 1 capsule (0.4 mg total) by mouth daily. 01/24/21   Pickenpack-Cousar, Carlena Sax, NP  prochlorperazine (COMPAZINE) 10 MG tablet Take 1 tablet (10 mg total) by mouth every 6 (six) hours as needed (Nausea or vomiting). Patient not taking: Reported on 11/12/2020 09/06/20 11/12/20  [name], MD                                                                                                                                    Allergies Contrast media [iodinated contrast media] and Other  Review of Systems Review of Systems As noted in HPI  Physical Exam Vital Signs  I have reviewed the triage vital signs BP (!) 154/90    Pulse 83    Temp (!) 97.5 F (36.4 C) (Oral)    Resp (!) 24    SpO2 99%   Physical Exam Vitals reviewed.  Constitutional:      General: He is not in acute distress.    Appearance: He is well-developed and underweight. He is not diaphoretic.  HENT:     Head: Normocephalic and atraumatic.     Right Ear: External ear normal.     Left Ear: External ear normal.     Nose: Nose normal.     Mouth/Throat:     Mouth: Mucous membranes are moist.  Eyes:     General: No scleral icterus.    Conjunctiva/sclera: Conjunctivae normal.  Neck:     Trachea: Phonation normal.  Cardiovascular:     Rate and Rhythm: Normal rate and regular rhythm.  Pulmonary:     Effort: Pulmonary effort is normal. No respiratory distress.     Breath sounds: No stridor.  Abdominal:     General: There is no distension.     Tenderness: There is generalized abdominal tenderness.  Musculoskeletal:        General: Normal range of motion.     Cervical back: Normal range of motion.  Skin:    General: Skin is dry.   Neurological:     Mental Status: He is alert and oriented to person, place, and time.  Psychiatric:  Behavior: Behavior normal.    ED Results and Treatments Labs (all labs ordered are listed, but only abnormal results are displayed) Labs Reviewed  CBC WITH DIFFERENTIAL/PLATELET - Abnormal; Notable for the following components:      Result Value   RBC 3.08 (*)    Hemoglobin 9.4 (*)    HCT 27.8 (*)    RDW 20.4 (*)    Platelets 86 (*)    nRBC 3.8 (*)    Abs Immature Granulocytes 0.46 (*)    All other components within normal limits  COMPREHENSIVE METABOLIC PANEL - Abnormal; Notable for the following components:   Potassium 3.1 (*)    Glucose, Bld 121 (*)    Creatinine, Ser 0.48 (*)    Calcium 8.8 (*)    Alkaline Phosphatase 619 (*)    All other components within normal limits  LIPASE, BLOOD  LACTIC ACID, PLASMA  URINALYSIS, ROUTINE W REFLEX MICROSCOPIC                                                                                                                         EKG  EKG Interpretation  Date/Time:    Ventricular Rate:    PR Interval:    QRS Duration:   QT Interval:    QTC Calculation:   R Axis:     Text Interpretation:         Radiology CT Renal Stone Study  Result Date: 02/21/2021 CLINICAL DATA:  61 year old male with history of back and abdominal pain. Unable to urinate for 2 days. EXAM: CT ABDOMEN AND PELVIS WITHOUT CONTRAST TECHNIQUE: Multidetector CT imaging of the abdomen and pelvis was performed following the standard protocol without IV contrast. RADIATION DOSE REDUCTION: This exam was performed according to the departmental dose-optimization program which includes automated exposure control, adjustment of the mA and/or kV according to patient size and/or use of iterative reconstruction technique. COMPARISON:  CT the abdomen and pelvis 02/07/2021. FINDINGS: Lower chest: Elevation of the posterior aspect of the left hemidiaphragm. Hepatobiliary: No  definite suspicious cystic or solid hepatic lesions are confidently identified on today's noncontrast CT examination. Unenhanced appearance of the gallbladder is normal. Pancreas: No definite pancreatic mass or peripancreatic fluid collections or inflammatory changes are noted on today's noncontrast CT examination. Spleen: Unremarkable. Adrenals/Urinary Tract: There are no abnormal calcifications within the collecting system of either kidney, along the course of either ureter, or within the lumen of the urinary bladder. No hydroureteronephrosis or perinephric stranding to suggest urinary tract obstruction at this time. The unenhanced appearance of the kidneys is unremarkable bilaterally. Unenhanced appearance of the urinary bladder is normal. Bilateral adrenal glands are normal in appearance. Stomach/Bowel: Unenhanced appearance of the stomach is normal. No pathologic dilatation of small bowel or colon. The appendix is not confidently identified and may be surgically absent. Regardless, there are no inflammatory changes noted adjacent to the cecum to suggest the presence of an acute appendicitis at this time. Vascular/Lymphatic: Atherosclerotic calcifications in the pelvic vasculature.  No lymphadenopathy noted in the abdomen or pelvis. Reproductive: Prostate gland and seminal vesicles are unremarkable in appearance. Other: No significant volume of ascites.  No pneumoperitoneum. Musculoskeletal: Diffuse sclerotic lesions noted throughout the skeleton, compatible with widespread metastatic disease. IMPRESSION: 1. No urinary tract calculi or findings of urinary tract obstruction. 2. Diffuse metastatic disease to the bones again noted, presumably from prostate cancer. Prostate is grossly unremarkable in appearance on today's examination. 3. Atherosclerosis. Electronically Signed   By: Vinnie Langton M.D.   On: 02/21/2021 05:38    Pertinent labs & imaging results that were available during my care of the patient were  reviewed by me and considered in my medical decision making (see MDM for details).  Medications Ordered in ED Medications  sodium chloride 0.9 % bolus 1,000 mL (0 mLs Intravenous Stopped 02/21/21 0459)  HYDROmorphone (DILAUDID) injection 1 mg (1 mg Intravenous Given 02/21/21 0208)  alum & mag hydroxide-simeth (MAALOX/MYLANTA) 200-200-20 MG/5ML suspension 30 mL (30 mLs Oral Given 02/21/21 0228)    And  lidocaine (XYLOCAINE) 2 % viscous mouth solution 15 mL (15 mLs Oral Given 02/21/21 0228)  HYDROmorphone (DILAUDID) injection 1 mg (1 mg Intravenous Given 02/21/21 0459)  sodium chloride 0.9 % bolus 500 mL (0 mLs Intravenous Stopped 02/21/21 0639)  HYDROmorphone (DILAUDID) injection 1 mg (1 mg Intravenous Given 02/21/21 1062)                                                                                                                                     Procedures Procedures  (including critical care time)  Medical Decision Making / ED Course        Abdominal pain and Generalized Exam not peritonitis. Given patient's history of prostate cancer with mets to the bones will need to obtain labs to assess for any electrolyte derangements. Will defer imaging at this time unless patient does not improve with additional treatment.  Work-up ordered to assess concerns above.  Labs and imaging independently interpreted by me and noted below: CBC without leukocytosis.  Stable hemoglobin. No significant electrolyte derangements or renal sufficiency.  No evidence of bili obstruction or pancreatitis. Lactic acid normal. CT scan was obtained given his lack of improvement which did not reveal any evidence of obstruction or serious intra-abdominal inflammatory/infectious process  Management: Provided with IV fluids and IV pain medicine  Reassessment: Patient still in discomfort but reports improvement.  Patient's bladder scan noted to be greater than 300 cc.  Patient declined in and out or Foley  catheter stated he would be able to void later in the day.  Final Clinical Impression(s) / ED Diagnoses Final diagnoses:  Generalized abdominal pain   The patient appears reasonably screened and/or stabilized for discharge and I doubt any other medical condition or other [facility] requiring further screening, evaluation, or treatment in the ED at this time prior to discharge. Safe for discharge with strict return precautions.  Disposition: Discharge  Condition: Good  I have discussed the results, Dx and Tx plan with the patient/family who expressed understanding and agree(s) with the plan. Discharge instructions discussed at length. The patient/family was given strict return precautions who verbalized understanding of the instructions. No further questions at time of discharge.    ED Discharge Orders     None         Follow Up: Robert Bellow, PA-C [address] [place] [address] [phone]  Call  to schedule an appointment for close follow up           This chart was dictated using voice recognition software.  Despite best efforts to proofread,  errors can occur which can change the documentation meaning.    Fatima Blank, MD 02/21/21 [phone]

## 2021-02-22 ENCOUNTER — Inpatient Hospital Stay: Payer: Medicare Other

## 2021-02-22 ENCOUNTER — Inpatient Hospital Stay: Payer: Medicare Other | Admitting: Hematology & Oncology

## 2021-02-22 ENCOUNTER — Telehealth: Payer: Self-pay | Admitting: *Deleted

## 2021-02-22 NOTE — Telephone Encounter (Signed)
Call received from patient stating that he will not be in for his appt today d/t he has a new diagnosis of a blood clot to his right leg from Friday 02/15/21 and was prescribed Eliquis 5 mg BID per Dr. Liana Crocker.  Pt also states that he was in the ER yesterday for abdominal pain and that he would like to move his appts for today out to next week.  Dr. Marin Olp notified and message sent to scheduling.

## 2021-02-25 ENCOUNTER — Other Ambulatory Visit: Payer: Self-pay | Admitting: Family

## 2021-02-28 ENCOUNTER — Other Ambulatory Visit: Payer: Self-pay

## 2021-02-28 ENCOUNTER — Inpatient Hospital Stay: Payer: Medicare Other | Attending: Hematology & Oncology

## 2021-02-28 DIAGNOSIS — K5900 Constipation, unspecified: Secondary | ICD-10-CM | POA: Insufficient documentation

## 2021-02-28 DIAGNOSIS — R634 Abnormal weight loss: Secondary | ICD-10-CM | POA: Insufficient documentation

## 2021-02-28 DIAGNOSIS — R630 Anorexia: Secondary | ICD-10-CM | POA: Insufficient documentation

## 2021-02-28 DIAGNOSIS — C61 Malignant neoplasm of prostate: Secondary | ICD-10-CM | POA: Insufficient documentation

## 2021-02-28 DIAGNOSIS — G893 Neoplasm related pain (acute) (chronic): Secondary | ICD-10-CM | POA: Insufficient documentation

## 2021-02-28 NOTE — Progress Notes (Signed)
Nutrition Follow-up:  Patient with metastatic prostate cancer.  Patient receiving eligard and zometa, lupron.    Spoke with patient via phone.  Patient reports that he is eating but still loosing weight.  Says that he moved in with his sister and she prepares all meals for him.  He has been drinking shakes, drinking smoothie made with ensure and banana.  Says that he has been eating meals but it takes him all day to eat it.      Medications: reviewed  Labs: reviewed  Anthropometrics:   Weight 127 lb on 1/12 131 lb on 12/22  UBW of 165 lb October 2022  NUTRITION DIAGNOSIS: Inadequate oral intake continues   INTERVENTION:  Reviewed importance of increasing calories. Patient request for handouts to be re-mailed to his sisters house [place], [place], [place] [id number] Along with ensure coupons Recommend 350 calorie shake 2-3 times per day Encouraged patient to add peanut butter to smoothie for increased protein.      MONITORING, EVALUATION, GOAL: weight trends, intake   NEXT VISIT: phone call on Thursday, Mar 2 Ariyanna Oien B. Zenia Resides, [place], [place] Registered Dietitian [phone] (mobile)

## 2021-03-01 ENCOUNTER — Encounter: Payer: Self-pay | Admitting: Family

## 2021-03-01 ENCOUNTER — Inpatient Hospital Stay (HOSPITAL_BASED_OUTPATIENT_CLINIC_OR_DEPARTMENT_OTHER): Payer: Medicare Other | Admitting: Family

## 2021-03-01 ENCOUNTER — Inpatient Hospital Stay: Payer: Medicare Other

## 2021-03-01 VITALS — BP 100/81 | HR 100

## 2021-03-01 DIAGNOSIS — C61 Malignant neoplasm of prostate: Secondary | ICD-10-CM

## 2021-03-01 DIAGNOSIS — C7951 Secondary malignant neoplasm of bone: Secondary | ICD-10-CM

## 2021-03-01 DIAGNOSIS — G893 Neoplasm related pain (acute) (chronic): Secondary | ICD-10-CM | POA: Diagnosis not present

## 2021-03-01 DIAGNOSIS — K5901 Slow transit constipation: Secondary | ICD-10-CM

## 2021-03-01 DIAGNOSIS — Z515 Encounter for palliative care: Secondary | ICD-10-CM | POA: Diagnosis not present

## 2021-03-01 DIAGNOSIS — R531 Weakness: Secondary | ICD-10-CM | POA: Diagnosis not present

## 2021-03-01 DIAGNOSIS — R634 Abnormal weight loss: Secondary | ICD-10-CM | POA: Diagnosis not present

## 2021-03-01 DIAGNOSIS — K5900 Constipation, unspecified: Secondary | ICD-10-CM | POA: Diagnosis not present

## 2021-03-01 DIAGNOSIS — R630 Anorexia: Secondary | ICD-10-CM | POA: Diagnosis not present

## 2021-03-01 LAB — CMP (CANCER CENTER ONLY)
ALT: 13 U/L (ref 0–44)
AST: 15 U/L (ref 15–41)
Albumin: 4.2 g/dL (ref 3.5–5.0)
Alkaline Phosphatase: 754 U/L — ABNORMAL HIGH (ref 38–126)
Anion gap: 8 (ref 5–15)
BUN: 10 mg/dL (ref 6–20)
CO2: 26 mmol/L (ref 22–32)
Calcium: 8.9 mg/dL (ref 8.9–10.3)
Chloride: 102 mmol/L (ref 98–111)
Creatinine: 0.75 mg/dL (ref 0.61–1.24)
GFR, Estimated: >60 mL/min
Glucose, Bld: 120 mg/dL — ABNORMAL HIGH (ref 70–99)
Potassium: 4.2 mmol/L (ref 3.5–5.1)
Sodium: 136 mmol/L (ref 135–145)
Total Bilirubin: 0.5 mg/dL (ref 0.3–1.2)
Total Protein: 6.9 g/dL (ref 6.5–8.1)

## 2021-03-01 LAB — CBC WITH DIFFERENTIAL (CANCER CENTER ONLY)
Abs Immature Granulocytes: 0.4 10*3/uL — ABNORMAL HIGH (ref 0.00–0.07)
Basophils Absolute: 0 10*3/uL (ref 0.0–0.1)
Basophils Relative: 1 %
Eosinophils Absolute: 0.1 10*3/uL (ref 0.0–0.5)
Eosinophils Relative: 1 %
HCT: 30.4 % — ABNORMAL LOW (ref 39.0–52.0)
Hemoglobin: 10 g/dL — ABNORMAL LOW (ref 13.0–17.0)
Immature Granulocytes: 5 %
Lymphocytes Relative: 33 %
Lymphs Abs: 2.9 10*3/uL (ref 0.7–4.0)
MCH: 30.2 pg (ref 26.0–34.0)
MCHC: 32.9 g/dL (ref 30.0–36.0)
MCV: 91.8 fL (ref 80.0–100.0)
Monocytes Absolute: 1 10*3/uL (ref 0.1–1.0)
Monocytes Relative: 11 %
Neutro Abs: 4.4 10*3/uL (ref 1.7–7.7)
Neutrophils Relative %: 49 %
Platelet Count: 90 10*3/uL — ABNORMAL LOW (ref 150–400)
RBC: 3.31 MIL/uL — ABNORMAL LOW (ref 4.22–5.81)
RDW: 20.9 % — ABNORMAL HIGH (ref 11.5–15.5)
WBC Count: 8.7 10*3/uL (ref 4.0–10.5)
nRBC: 3.8 % — ABNORMAL HIGH (ref 0.0–0.2)

## 2021-03-01 MED ORDER — SODIUM CHLORIDE 0.9 % IV SOLN: INTRAVENOUS | Status: DC

## 2021-03-01 MED ORDER — FENTANYL 100 MCG/HR TD PT72: 1.0000 | MEDICATED_PATCH | TRANSDERMAL | 0 refills | Status: AC

## 2021-03-01 MED ORDER — HEPARIN SOD (PORK) LOCK FLUSH 100 UNIT/ML IV SOLN
500.0000 [IU] | Freq: Once | INTRAVENOUS | Status: AC
  Administered 2021-03-01: 500 [IU] via INTRAVENOUS

## 2021-03-01 MED ORDER — SODIUM CHLORIDE 0.9% FLUSH
10.0000 mL | Freq: Once | INTRAVENOUS | Status: AC
  Administered 2021-03-01: 10 mL via INTRAVENOUS

## 2021-03-01 NOTE — Progress Notes (Signed)
Hematology and Oncology Follow Up Visit  Ryan Davis [id number] 1960-06-06 61 y.o. 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   Past Therapy: 14 fractions of radiation to the lumbar spine Casodex 50 mg by mouth daily - stopped 04/02/2017 due to progression  Zytiga 1000 mg by mouth daily/prednisone 10 mg by mouth daily - started on 04/20/2016 - d/c on 04/02/2017 Xtandi 160 mg po q day - started on 04/06/2017 -- d/c on 06/24/2017 Flutamide 250 mg po q8hr - started 07/08/2017 -- d/c on 04/23/18 Taxotere --  s/p cycle #3--  start on 09/05/2020 -- d/c on 11/12/2020 due to progression Apalutamide 240 mg po q day -- started on 04/28/2018- DC'd   Current Therapy:        Lupron 11.5 mg IM every 3 month - next dose 03/2021  Zometa 4 mg IV q 3 months - next dose 03/2021 Pluvicto -- January 2023 Eliquis 5 mg PO BID   Interim History:  Ryan Davis is here today with his brother Ryan Davis for follow-up. He is quite weak and fatigued. He has not been eating or hydrating well throughout the day. His weight is down 6 lbs since his last check 3 weeks ago.  He was diagnosed with a left lower extremity DVT last month and still has a little swelling in the left foot.  He states that the pain in his upper back and shoulders is an 8/10 at this time.  He has a 75 mcg Duragesic patch on and states that he is taking his Oxycodone 3-4 times a day for breakthrough pain.  No fever, chills, n/v, cough, rash, dizziness, SOB, chest pain, palpitations, abdominal pain or changes in bowel or bladder habits.  No obvious blood loss noted. No bruising or petechiae.  No falls or syncope to report.   ECOG Performance Status: 1 - Symptomatic but completely ambulatory  Medications:  Allergies as of 03/01/2021       Reactions   Contrast Media [iodinated Contrast Media] Nausea And Vomiting   Patient vomits immediately after IV contrast is injected just prior to start of scan,  was unable to scan patient in correct time frame.  Patient did not warn me of this when dosing the patient with oral CM and asking the patient history questions until the patient was on the table for the CT scan just prior to injection.  Ct Tech:  Ryan Davis 07/29/17    Other Nausea And Vomiting   Patient vomits immediately after IV contrast is injected just prior to start of scan, was unable to scan patient in correct time frame.  Patient did not warn me of this when dosing the patient with oral CM and asking the patient history questions until the patient was on the table for the CT scan just prior to injection.  Ct Tech:  Ryan Davis 07/29/17         Medication List        Accurate as of March 01, 2021  1:26 PM. If you have any questions, ask your nurse or doctor.          cephALEXin 500 MG capsule Commonly known as: KEFLEX Take 1 capsule (500 mg total) by mouth 4 (four) times daily.   dronabinol 5 MG capsule Commonly known as: MARINOL Take 1 capsule (5 mg total) by mouth 2 (two) times daily before a meal.   famotidine 40 MG tablet Commonly known as: PEPCID Take 1 tablet (  40 mg total) by mouth 2 (two) times daily.   fentaNYL 75 MCG/HR Commonly known as: [place] 1 patch onto the skin every other day.   gabapentin 800 MG tablet Commonly known as: NEURONTIN Take 800 mg by mouth See admin instructions. Take 400mg  (one half tablet) by mouth in the morning and 1600mg  (2 tablets) at night   lactulose 10 GM/15ML solution Commonly known as: CHRONULAC Take 15 mLs (10 g total) by mouth daily as needed for mild constipation, severe constipation or moderate constipation.   metoprolol tartrate 25 MG tablet Commonly known as: LOPRESSOR TAKE 1/2 TABLET(12.5 MG) BY MOUTH TWICE DAILY What changed: See the new instructions.   naloxegol oxalate 12.5 MG Tabs tablet Commonly known as: MOVANTIK Take 1 tablet (12.5 mg total) by mouth daily.   Naloxone HCl 0.4 MG/0.4ML  Soaj Use in the event of accidental overdose of opioids.   oxycodone 30 MG immediate release tablet Commonly known as: ROXICODONE Take 1 tablet (30 mg total) by mouth every 4 (four) hours as needed for pain.   pantoprazole 40 MG tablet Commonly known as: Protonix Take 1 tablet (40 mg total) by mouth daily. What changed:  when to take this reasons to take this   polyethylene glycol 17 g packet Commonly known as: MIRALAX / GLYCOLAX Take 17 g by mouth 2 (two) times daily.   senna 8.6 MG Tabs tablet Commonly known as: SENOKOT Take 2 tablets (17.2 mg total) by mouth 2 (two) times daily.   tamsulosin 0.4 MG Caps capsule Commonly known as: FLOMAX Take 1 capsule (0.4 mg total) by mouth daily.   Trulance 3 MG Tabs Generic drug: Plecanatide Take 3 mg by mouth daily.        Allergies:  Allergies  Allergen Reactions   Contrast Media [Iodinated Contrast Media] Nausea And Vomiting    Patient vomits immediately after IV contrast is injected just prior to start of scan, was unable to scan patient in correct time frame.  Patient did not warn me of this when dosing the patient with oral CM and asking the patient history questions until the patient was on the table for the CT scan just prior to injection.  Ct Tech:  Ryan Davis 07/29/17    Other Nausea And Vomiting    Patient vomits immediately after IV contrast is injected just prior to start of scan, was unable to scan patient in correct time frame.  Patient did not warn me of this when dosing the patient with oral CM and asking the patient history questions until the patient was on the table for the CT scan just prior to injection.  Ct Tech:  Ryan Davis 07/29/17     Past Medical History, Surgical history, Social history, and Family History were reviewed and updated.  Review of Systems: All other 10 point review of systems is negative.   Physical Exam:  vitals were not taken for this visit.   Wt Readings from Last 3 Encounters:   02/07/21 127 lb (57.6 kg)  01/23/21 125 lb 9.6 oz (57 kg)  01/14/21 131 lb 0.6 oz (59.4 kg)    Ocular: Sclerae unicteric, pupils equal, round and reactive to light Ear-nose-throat: Oropharynx clear, dentition fair Lymphatic: No cervical or supraclavicular adenopathy Lungs no rales or rhonchi, good excursion bilaterally Heart regular rate and rhythm, no murmur appreciated Abd soft, nontender, positive bowel sounds MSK no focal spinal tenderness, no joint edema Neuro: non-focal, well-oriented, appropriate affect Breasts: Deferred   Lab Results  Component  Value Date   WBC 8.9 02/21/2021   HGB 9.4 (L) 02/21/2021   HCT 27.8 (L) 02/21/2021   MCV 90.3 02/21/2021   PLT 86 (L) 02/21/2021   Lab Results  Component Value Date   FERRITIN 216 11/04/2017   IRON 77 11/04/2017   TIBC 261 11/04/2017   UIBC 184 11/04/2017   IRONPCTSAT 30 (L) 11/04/2017   Lab Results  Component Value Date   RBC 3.08 (L) 02/21/2021   Lab Results  Component Value Date   KAPLAMBRATIO 0.89 03/28/2016   Lab Results  Component Value Date   IGGSERUM 1,066 03/28/2016   IGMSERUM 106 03/28/2016   No results found for: Odetta Pink, SPEI   Chemistry      Component Value Date/Time   NA 136 02/21/2021 0200   NA 143 12/16/2016 1022   K 3.1 (L) 02/21/2021 0200   K 3.5 12/16/2016 1022   CL 102 02/21/2021 0200   CL 101 12/16/2016 1022   CO2 23 02/21/2021 0200   CO2 33 12/16/2016 1022   BUN 7 02/21/2021 0200   BUN 4 (L) 12/16/2016 1022   CREATININE 0.48 (L) 02/21/2021 0200   CREATININE 0.67 01/14/2021 1350   CREATININE 0.8 12/16/2016 1022      Component Value Date/Time   CALCIUM 8.8 (L) 02/21/2021 0200   CALCIUM 9.4 12/16/2016 1022   ALKPHOS 619 (H) 02/21/2021 0200   ALKPHOS 77 12/16/2016 1022   AST 17 02/21/2021 0200   AST 15 01/14/2021 1350   ALT 19 02/21/2021 0200   ALT 16 01/14/2021 1350   ALT 16 12/16/2016 1022   BILITOT 0.7 02/21/2021  0200   BILITOT 0.4 01/14/2021 1350       Impression and Plan: Mr. Caddell is a very pleasant 61 yo African American gentleman with metastatic prostate cancer, progressive.  PSA in December was up to 482.  He states that he never got treated with Pluvicto and continues to deteriorate.  He is now living with his sister who is his main caregiver.  Dr. Marin Olp was also present for this visit and discussed hospice with the patient and his brother at length. The patient will consider this and speak with his sister about it.  We gave him IV fluids today and have increased his Duragesic patch to 100 mcg to be changed every 48 hours.  We will see him back in 4 weeks.   Lottie Dawson, NP 2/3/20231:26 PM

## 2021-03-01 NOTE — Patient Instructions (Signed)

## 2021-03-02 LAB — PSA, TOTAL AND FREE
PSA, Free Pct: >7 %
PSA, Free: >50 ng/mL
Prostate Specific Ag, Serum: 711 ng/mL — ABNORMAL HIGH (ref 0.0–4.0)

## 2021-03-02 LAB — TESTOSTERONE: Testosterone: <3 ng/dL — ABNORMAL LOW (ref 264–916)

## 2021-03-05 ENCOUNTER — Telehealth: Payer: Self-pay | Admitting: *Deleted

## 2021-03-05 NOTE — Telephone Encounter (Signed)
Per 03/01/21 los - called and gave upcoming appointment - confirmed

## 2021-03-07 ENCOUNTER — Inpatient Hospital Stay (HOSPITAL_BASED_OUTPATIENT_CLINIC_OR_DEPARTMENT_OTHER): Payer: Medicare Other | Admitting: Nurse Practitioner

## 2021-03-07 ENCOUNTER — Encounter (HOSPITAL_COMMUNITY): Payer: Self-pay | Admitting: Emergency Medicine

## 2021-03-07 ENCOUNTER — Emergency Department (HOSPITAL_COMMUNITY)
Admission: EM | Admit: 2021-03-07 | Discharge: 2021-03-07 | Disposition: A | Discharge status: Home / Self Care | Payer: Medicare Other | Attending: Emergency Medicine | Admitting: Emergency Medicine

## 2021-03-07 ENCOUNTER — Other Ambulatory Visit: Payer: Self-pay

## 2021-03-07 ENCOUNTER — Encounter: Payer: Self-pay | Admitting: Nurse Practitioner

## 2021-03-07 ENCOUNTER — Emergency Department (HOSPITAL_COMMUNITY)
Admission: EM | Admit: 2021-03-07 | Discharge: 2021-03-07 | Discharge status: Against Medical Advice | Payer: Medicare Other

## 2021-03-07 VITALS — BP 159/93 | HR 121 | Temp 98.4°F | Resp 18 | Wt 121.0 lb

## 2021-03-07 DIAGNOSIS — K921 Melena: Secondary | ICD-10-CM | POA: Diagnosis not present

## 2021-03-07 DIAGNOSIS — R531 Weakness: Secondary | ICD-10-CM

## 2021-03-07 DIAGNOSIS — R109 Unspecified abdominal pain: Secondary | ICD-10-CM | POA: Diagnosis not present

## 2021-03-07 DIAGNOSIS — C61 Malignant neoplasm of prostate: Secondary | ICD-10-CM | POA: Diagnosis not present

## 2021-03-07 DIAGNOSIS — G893 Neoplasm related pain (acute) (chronic): Secondary | ICD-10-CM

## 2021-03-07 DIAGNOSIS — K5900 Constipation, unspecified: Secondary | ICD-10-CM

## 2021-03-07 DIAGNOSIS — R634 Abnormal weight loss: Secondary | ICD-10-CM

## 2021-03-07 DIAGNOSIS — Z5321 Procedure and treatment not carried out due to patient leaving prior to being seen by health care provider: Secondary | ICD-10-CM | POA: Insufficient documentation

## 2021-03-07 DIAGNOSIS — Z515 Encounter for palliative care: Secondary | ICD-10-CM

## 2021-03-07 DIAGNOSIS — Z7189 Other specified counseling: Secondary | ICD-10-CM

## 2021-03-07 DIAGNOSIS — R630 Anorexia: Secondary | ICD-10-CM

## 2021-03-07 NOTE — ED Notes (Signed)
Wants his port accessed

## 2021-03-07 NOTE — ED Notes (Signed)
No call for room x 3.

## 2021-03-07 NOTE — Progress Notes (Signed)
Ryan Davis  [phone] [phone]   Name: Ryan Davis Date: 03/07/2021 MRN: [id number]  DOB: 06/07/1960  Patient Care Team: Ryan Davis as PCP - General (Internal Medicine) Pickenpack-Cousar, Ryan Sax, NP as Nurse Practitioner (Nurse Practitioner)   INTERVAL HISTORY: Ryan Davis is a 61 y.o. male with  multiple medical problems including metastatic prostate cancer to multiple sites including bone, chronic pain secondary to metastatic disease, paroxysmal AF not on anticoagulation.  Palliative ask to see for symptom management.  SOCIAL HISTORY:     reports that he quit smoking about 10 months ago. His smoking use included cigarettes. He has a 19.50 pack-year smoking history. He has never used smokeless tobacco. He reports that he does not currently use alcohol. He reports that he does not use drugs.  ADVANCE DIRECTIVES:  Patient has a Buyer, retail. His sister Ryan Davis) is primary agent and his brother Ryan Davis) is secondary.   CODE STATUS:   PAST MEDICAL HISTORY: Past Medical History:  Diagnosis Date   A-fib [facility])    see 03-28-16 admission   Back pain without radiculopathy 03/28/2016   Dyspnea    endorses since treatment radiation , still gets SOB occasionally    Dysrhythmia    afib    Goals of care, counseling/discussion 06/24/2017   History of radiation therapy 09/16/16-10/06/16   35 Gy in 14 fractions to the lumbar spine   Nocturia    Prostate cancer metastatic to bone ([place]) 03/31/2016   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[place]) 03/31/2016    ALLERGIES:  is allergic to contrast media [iodinated contrast media] and other.  MEDICATIONS:  No current facility-administered medications for this visit.   Current Outpatient Medications  Medication Sig Dispense Refill   APIXABAN (ELIQUIS) VTE STARTER PACK (10MG  AND 5MG ) Take 2 tablets (10mg ) by mouth twice daily for 7 days,  then take 1 tablet (5 mg) twice daily. Start date: 02/15/21  Stop date: TBD.     cephALEXin (KEFLEX) 500 MG capsule Take 1 capsule (500 mg total) by mouth 4 (four) times daily. 28 capsule 0   dronabinol (MARINOL) 5 MG capsule Take 1 capsule (5 mg total) by mouth 2 (two) times daily before a meal. 60 capsule 0   ELIQUIS DVT/PE STARTER PACK Take 1 tablet by mouth as directed.     famotidine (PEPCID) 40 MG tablet Take 1 tablet (40 mg total) by mouth 2 (two) times daily. 60 tablet 6   fentaNYL (DURAGESIC) 100 MCG/HR Place 1 patch onto the skin every other day. 15 patch 0   gabapentin (NEURONTIN) 800 MG tablet Take 800 mg by mouth See admin instructions. Take 400mg  (one half tablet) by mouth in the morning and 1600mg  (2 tablets) at night     lactulose (CHRONULAC) 10 GM/15ML solution Take 15 mLs (10 g total) by mouth daily as needed for mild constipation, severe constipation or moderate constipation. 236 mL 0   metoprolol tartrate (LOPRESSOR) 25 MG tablet TAKE 1/2 TABLET(12.5 MG) BY MOUTH TWICE DAILY (Patient taking differently: Take 12.5 mg by mouth 2 (two) times daily.) 60 tablet 3   naloxegol oxalate (MOVANTIK) 12.5 MG TABS tablet Take 1 tablet (12.5 mg total) by mouth daily. 30 tablet 0   Naloxone HCl 0.4 MG/0.4ML SOAJ Use in the event of accidental overdose of opioids. 0.4 mL 0   oxycodone (ROXICODONE) 30 MG immediate release tablet Take 1 tablet (30 mg total) by mouth  every 4 (four) hours as needed for pain. 90 tablet 0   pantoprazole (PROTONIX) 40 MG tablet Take 1 tablet (40 mg total) by mouth daily. (Patient taking differently: Take 40 mg by mouth daily as needed (for acid reflux).) 30 tablet 0   Plecanatide (TRULANCE) 3 MG TABS Take 3 mg by mouth daily.     polyethylene glycol (MIRALAX / GLYCOLAX) 17 g packet Take 17 g by mouth 2 (two) times daily. 60 each 1   senna (SENOKOT) 8.6 MG TABS tablet Take 2 tablets (17.2 mg total) by mouth 2 (two) times daily. 120 tablet 0   senna-docusate (SENOKOT-S)  8.6-50 MG tablet Take by mouth.     tamsulosin (FLOMAX) 0.4 MG CAPS capsule Take 1 capsule (0.4 mg total) by mouth daily. 90 capsule 1   Facility-Administered Medications Ordered in Other Visits  Medication Dose Route Frequency Provider Last Rate Last Admin   SONAFINE emulsion 1 application  1 application Topical BID Gery Pray, MD        VITAL SIGNS: BP (!) 159/93 (BP Location: Left Arm, Patient Position: Sitting) Comment: Nurse aware of patient BP   Davis (!) 121    Temp 98.4 F (36.9 C) (Tympanic)    Resp 18    Wt 121 lb (54.9 kg)    SpO2 100%    BMI 15.54 kg/m  Filed Weights   03/07/21 1118  Weight: 121 lb (54.9 kg)    Estimated body mass index is 15.54 kg/m as calculated from the following:   Height as of 02/07/21: 6\' 2"  (1.88 m).   Weight as of this encounter: 121 lb (54.9 kg).   PERFORMANCE STATUS (ECOG) : 2 - Symptomatic, <50% confined to bed   IMPRESSION:  Ryan Davis presents to clinic today for follow-up with support of his sister, Ryan Davis. He is frail, thin, and weak appearing. In a wheelchair. Appears uncomfortable, guarding stomach, bending over in tears. He reports he has not slept in over 24 hours due to pain and discomfort. He has not had a bowel movement in 3 days despite all efforts. He shares recently noticing blood from his rectum. During visit had an incontinent episode of loose stool.   Chronic pain/neoplasm related Ryan Davis appears in significant discomfort. His fentanyl patch was recently increased to 100 mcg per Dr. Marin Davis. He also is taking Roxicodone and gabapentin. Despite current regimen patient continues to have abdominal pain which is limited his ability to function.   I have provided recommendations for further evaluation in the emergency department due to his level his discomfort. He is reluctant to go however his sister expresses wishes for work-up and hopes of gaining comfort.   2. Anorexia/weight loss Mr, Davis is being followed by the  Dietician.  His appetite continues to be poor. Family reports he will eat on certain occassions but in general does not eat much and can go days without eating. His weight is down to 121lb today (146lb 11/18, 132lb 12/8, 125lb 12/28).    Constipation Mr. Chanthavong reports he is extremely constipated. We reviewed his bowel regimen and states he has been taking all medications as prescribed. Reports no bowel movement in 3-4 days. Loose incontinent stool in the office today.   4. Goals of Care   I empathetically approached discussions regarding goals of care and hospice. Patient continues to decline in health. Poor prognosis. Given his disease trajectory and poor performance status life expectancy of 6 months or less.   Patient expresses he is not  open to hospice at this time and is not interested in thinking about dying as he is not ready for this. He is emotional expressing he has much more life. Ryan Davis verbalizes her agreement with her brother sharing their strong faith in God. I acknowledge their feelings and supporting God's will. I discussed sometimes we have to change the things we are fighting for and focus on the fight for comfort during what time is left. They verbalized understanding. Ryan Davis shares her knowledge of hospice and their services. They have a brother who was under hospice care as she cared for him in her home.    I discussed the importance of continued conversation with family and their medical providers regarding overall plan of care and treatment options, ensuring decisions are within the context of the patients values and GOCs.  PLAN: Patient transported to [facility] Emergency department via wheelchair due to severe abdominal pain, constipation, metastatic cancer, and most likely facing end-of-life. If patient is admitted we will follow-up during hospitalization and offer ongoing support.   Patient expressed understanding and was in agreement with this plan. He also understands  that He can call the clinic at any time with any questions, concerns, or complaints.   Time Total: 40 min.   Visit consisted of counseling and education dealing with the complex and emotionally intense issues of symptom management and palliative care in the setting of serious and potentially life-threatening illness.Greater than 50%  of this time was spent counseling and coordinating care related to the above assessment and plan.   Signed by: Alda Lea, AGPCNP-BC [place]

## 2021-03-07 NOTE — ED Triage Notes (Signed)
Patient reports abdominal pain since last night, states he felt constipated and attempted to disimpact stool. He reports scant amount of blood in stool. Denies n/v. Hx stage 4 prostate cancer, currently receiving IV chemotherapy.

## 2021-03-07 NOTE — ED Provider Triage Note (Signed)
Emergency Medicine Provider Triage Evaluation Note  Ryan Davis , a 61 y.o. male  was evaluated in triage.  Pt complains of abdominal pain since last night.  Patient reports his abdominal pain is the same abdominal pain he is experienced in the past.  Patient has history of chronic abdominal pain.  Patient states that he also noticed scant blood in stool.  Patient states he had a normal bowel movement this morning  Review of Systems  Positive: Abdominal pain, blood in stool Negative: Nausea, vomiting, fever  Physical Exam  BP (!) 149/100 (BP Location: Left Arm)    Pulse (!) 127    Temp 98.6 F (37 C) (Oral)    Resp 18    SpO2 100%  Gen:   Awake, no distress   Resp:  Normal effort  MSK:   Moves extremities without difficulty  Other:  Diffuse abdominal pain  Medical Decision Making  Medically screening exam initiated at 1:53 PM.  Appropriate orders placed.  DVANTE HANDS was informed that the remainder of the evaluation will be completed by another provider, this initial triage assessment does not replace that evaluation, and the importance of remaining in the ED until their evaluation is complete.     Azucena Cecil, PA-C 03/07/21 1354

## 2021-03-11 ENCOUNTER — Other Ambulatory Visit: Payer: Self-pay | Admitting: Nurse Practitioner

## 2021-03-11 MED ORDER — GABAPENTIN 300 MG PO CAPS: ORAL_CAPSULE | ORAL | 1 refills | Status: DC

## 2021-03-11 MED ORDER — HYDROMORPHONE HCL 2 MG PO TABS: 2.0000 mg | ORAL_TABLET | Freq: Four times a day (QID) | ORAL | 0 refills | Status: AC | PRN

## 2021-03-11 MED ORDER — PANTOPRAZOLE SODIUM 40 MG PO TBEC: 40.0000 mg | DELAYED_RELEASE_TABLET | Freq: Every day | ORAL | 0 refills | Status: DC

## 2021-03-12 ENCOUNTER — Telehealth: Payer: Self-pay

## 2021-03-12 ENCOUNTER — Other Ambulatory Visit: Payer: Self-pay

## 2021-03-12 ENCOUNTER — Telehealth: Payer: Self-pay | Admitting: Nurse Practitioner

## 2021-03-12 DIAGNOSIS — K5900 Constipation, unspecified: Secondary | ICD-10-CM

## 2021-03-12 MED ORDER — LACTULOSE 10 GM/15ML PO SOLN: 10.0000 g | Freq: Every day | ORAL | 0 refills | Status: DC | PRN

## 2021-03-12 NOTE — Telephone Encounter (Signed)
Scheduled per 2/14 secure chat, pt sister has been called and confirmed

## 2021-03-12 NOTE — Telephone Encounter (Signed)
I called Ryan Davis to see how he did over the weekend with his pain. He stated that he was still having pain but had done well over the weekend. I notified [name], NP who sent in Dilaudid and a Gabepentin refill for Ryan Davis and I notified him of this. I also reviewed the importance of taking his Miralax. Understanding verbalized. All questions answered. Advised to call our office with any questions/concerns.

## 2021-03-14 ENCOUNTER — Telehealth: Payer: Self-pay | Admitting: *Deleted

## 2021-03-14 NOTE — Telephone Encounter (Signed)
Call received from patient's step-daughter, Ryan Davis requesting a referral for a second opinion for pt.  [name] instructed to contact pt.'s PCP for referral for second opinion.  Ryan Davis is appreciative of assistance and has no other questions at this time.

## 2021-03-18 ENCOUNTER — Other Ambulatory Visit: Payer: Self-pay

## 2021-03-18 ENCOUNTER — Encounter: Payer: Self-pay | Admitting: Nurse Practitioner

## 2021-03-18 ENCOUNTER — Inpatient Hospital Stay (HOSPITAL_BASED_OUTPATIENT_CLINIC_OR_DEPARTMENT_OTHER): Payer: Medicare Other | Admitting: Nurse Practitioner

## 2021-03-18 VITALS — BP 98/73 | HR 101 | Temp 98.3°F | Resp 18 | Wt 116.1 lb

## 2021-03-18 DIAGNOSIS — G893 Neoplasm related pain (acute) (chronic): Secondary | ICD-10-CM

## 2021-03-18 DIAGNOSIS — C61 Malignant neoplasm of prostate: Secondary | ICD-10-CM

## 2021-03-18 DIAGNOSIS — R634 Abnormal weight loss: Secondary | ICD-10-CM

## 2021-03-18 DIAGNOSIS — Z515 Encounter for palliative care: Secondary | ICD-10-CM

## 2021-03-18 DIAGNOSIS — R630 Anorexia: Secondary | ICD-10-CM

## 2021-03-18 DIAGNOSIS — R531 Weakness: Secondary | ICD-10-CM | POA: Diagnosis not present

## 2021-03-18 DIAGNOSIS — Z7189 Other specified counseling: Secondary | ICD-10-CM

## 2021-03-18 DIAGNOSIS — F419 Anxiety disorder, unspecified: Secondary | ICD-10-CM

## 2021-03-18 DIAGNOSIS — K5903 Drug induced constipation: Secondary | ICD-10-CM

## 2021-03-18 MED ORDER — LORAZEPAM 0.5 MG PO TABS: 0.5000 mg | ORAL_TABLET | Freq: Three times a day (TID) | ORAL | 0 refills | Status: AC | PRN

## 2021-03-18 NOTE — Progress Notes (Signed)
[facility]  [phone]) [phone] [phone]) [phone]   Name: Ryan Davis Date: 03/18/2021 MRN: [id number]  DOB: 07-Oct-1960  Patient Care Team: Rip Harbour as PCP - General (Internal Medicine) Pickenpack-Cousar, Carlena Sax, NP as Nurse Practitioner (Nurse Practitioner)   INTERVAL HISTORY: GAROLD SHEELER is a 61 y.o. male with  multiple medical problems including metastatic prostate cancer to multiple sites including bone, chronic pain secondary to metastatic disease, paroxysmal AF not on anticoagulation.  Palliative ask to see for symptom management.  SOCIAL HISTORY:     reports that he quit smoking about 10 months ago. His smoking use included cigarettes. He has a 19.50 pack-year smoking history. He has never used smokeless tobacco. He reports that he does not currently use alcohol. He reports that he does not use drugs.  ADVANCE DIRECTIVES:  Patient has a Buyer, retail. His sister [name] is primary agent and his brother Thurlow Gallaga) is secondary.   CODE STATUS:   PAST MEDICAL HISTORY: Past Medical History:  Diagnosis Date   A-fib [facility])    see 03-28-16 admission   Back pain without radiculopathy 03/28/2016   Dyspnea    endorses since treatment radiation , still gets SOB occasionally    Dysrhythmia    afib    Goals of care, counseling/discussion 06/24/2017   History of radiation therapy 09/16/16-10/06/16   35 Gy in 14 fractions to the lumbar spine   Nocturia    Prostate cancer metastatic to bone ([place]) 03/31/2016   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[place]) 03/31/2016    ALLERGIES:  is allergic to contrast media [iodinated contrast media] and other.  MEDICATIONS:  Current Outpatient Medications  Medication Sig Dispense Refill   LORazepam (ATIVAN) 0.5 MG tablet Take 1 tablet (0.5 mg total) by mouth every 8 (eight) hours as needed for anxiety. 30 tablet 0   APIXABAN (ELIQUIS) VTE STARTER PACK (10MG   AND 5MG ) Take 2 tablets (10mg ) by mouth twice daily for 7 days, then take 1 tablet (5 mg) twice daily. Start date: 02/15/21  Stop date: TBD.     cephALEXin (KEFLEX) 500 MG capsule Take 1 capsule (500 mg total) by mouth 4 (four) times daily. 28 capsule 0   dronabinol (MARINOL) 5 MG capsule Take 1 capsule (5 mg total) by mouth 2 (two) times daily before a meal. 60 capsule 0   ELIQUIS DVT/PE STARTER PACK Take 1 tablet by mouth as directed.     famotidine (PEPCID) 40 MG tablet Take 1 tablet (40 mg total) by mouth 2 (two) times daily. 60 tablet 6   fentaNYL (DURAGESIC) 100 MCG/HR Place 1 patch onto the skin every other day. 15 patch 0   gabapentin (NEURONTIN) 300 MG capsule Take 1 capsule in the morning (300 mg) and take 2 capsules (600 mg) at bedtime. 90 capsule 1   HYDROmorphone (DILAUDID) 2 MG tablet Take 1 tablet (2 mg total) by mouth every 6 (six) hours as needed for severe pain or moderate pain. 90 tablet 0   lactulose (CHRONULAC) 10 GM/15ML solution Take 15 mLs (10 g total) by mouth daily as needed for mild constipation, severe constipation or moderate constipation. 236 mL 0   metoprolol tartrate (LOPRESSOR) 25 MG tablet TAKE 1/2 TABLET(12.5 MG) BY MOUTH TWICE DAILY (Patient taking differently: Take 12.5 mg by mouth 2 (two) times daily.) 60 tablet 3   Naloxone HCl 0.4 MG/0.4ML SOAJ Use in the event of accidental overdose of opioids. 0.4  mL 0   pantoprazole (PROTONIX) 40 MG tablet Take 1 tablet (40 mg total) by mouth daily. 30 tablet 0   Plecanatide (TRULANCE) 3 MG TABS Take 3 mg by mouth daily.     polyethylene glycol (MIRALAX / GLYCOLAX) 17 g packet Take 17 g by mouth 2 (two) times daily. 60 each 1   senna (SENOKOT) 8.6 MG TABS tablet Take 2 tablets (17.2 mg total) by mouth 2 (two) times daily. 120 tablet 0   tamsulosin (FLOMAX) 0.4 MG CAPS capsule Take 1 capsule (0.4 mg total) by mouth daily. 90 capsule 1   No current facility-administered medications for this visit.   Facility-Administered  Medications Ordered in Other Visits  Medication Dose Route Frequency Provider Last Rate Last Admin   SONAFINE emulsion 1 application  1 application Topical BID Gery Pray, MD        VITAL SIGNS: BP 98/73 (BP Location: Left Arm, Patient Position: Sitting)    Pulse (!) 101    Temp 98.3 F (36.8 C) (Oral)    Resp 18    Wt 116 lb 2 oz (52.7 kg)    SpO2 100%    BMI 14.91 kg/m  Filed Weights   03/18/21 1419  Weight: 116 lb 2 oz (52.7 kg)    Estimated body mass index is 14.91 kg/m as calculated from the following:   Height as of 02/07/21: 6\' 2"  (1.88 m).   Weight as of this encounter: 116 lb 2 oz (52.7 kg).   PERFORMANCE STATUS (ECOG) : 2 - Symptomatic, <50% confined to bed   IMPRESSION:  Mr. Lehrmann presents to clinic today for follow-up with support of his sister, Juliann Pulse. He is frail, thin, and weak appearing. In a wheelchair. States he now spends most of his day in his room or in the recliner watching tv. Sister shares she tries to get him to move around more however continues to have ongoing weakness and some pain which is improved but is there. He seems much more comfortable than our previous visit.   Juliann Pulse has brought all of his medications to the appointment today. We spent extensive time reviewing each medication, rationale for use, and what he should and should not be taking. He had several medications that have not been refilled since 2022 (Aug/Sept) that he was to be taking. He also has several medications that have been discontinued. Each bottled label identifying to take. Stop was written on several bottles and these were placed in a separate bag for discarding. Juliann Pulse and patient verbalized understanding. Sister expresses she has been trying to manage his care and get on a routine.   Chronic pain/neoplasm related Mr. Buckhalter appears comfortable on exam today. Reports pain throughout the day with increased discomfort later in the evening/night hours. He is tolerating fentanyl patch  which dosage increased to 100 mcg per Dr. Marin Olp 2 weeks ago. He reported minimal to no relief with Roxicodone. This was discontinued and patient started on hydromorphone to gain better comfort. He reports he has not began taking although prescription was sent in over a week ago. He continues to have some confusion with how to take medications.   2. Anorexia/weight loss Mr, Thielen is being followed by the Dietician.  His appetite continues to be poor. Family reports he will eat on certain occassions but in general does not eat much and can go days without eating. Juliann Pulse shares she tries to make foods that he like however, this does not help as he will only eat  a few bites. He was started on marinol however reports he has not started this medication either. Encouraged to start with hopes of some improvement of appetite. His weight continues to decline at each visit, today he weighs 116lbs, down from 121lb on 2/9. (146lb 11/18, 132lb 12/8, 125lb 12/28). I shared open and honest concerns on his poor functional and nutritional state. He expresses understanding. He is severely malnourished with muscle and temporal wasting.    Constipation Constipation is improved. He is taking miralax, senna, and lactulose as needed. Last bowel movement on yesterday.   4. Goals of Care   I empathetically approached discussions regarding goals of care and hospice. Patient continues to decline in health. Poor prognosis. Given his disease trajectory, poor nutrition, and poor performance status life expectancy of 6 months or less.   Mr. Maclin and his sister, Juliann Pulse continues to decline discussions around hospice and end-of-life. He acknowledges that his cancer is metastatic and he will not have any significant improvement but expresses feelings that he can live in current state for some time. Is emotional expressing he is not "ready for the grave!" Emotional support provided. Juliann Pulse shares she does not like to be told or hear  anyone tell someone else "how long they have to live" because no one but God can decide this. I acknowledged her frustration and that no provider is placing a time limit on Lerry's life however want to be as open and honest to prevent unexpected events, death, decline based on disease trajectory. She verbalized understanding.   Of note there was a phone call to Dr. Antonieta Pert office from [place] who Anders states is his daughter-in-law. I asked if she was assisting in care. He expresses Letta Median is not involved in his care but does call and check on him. He has updated her on his condition and mentioned to her that providers were discussing hospice. He feels that is why she felt he needed a second opinion however, he advised he was not interested in pursuing knowing overall outcome would not be any different. In addition he reports he trusts myself and his Oncology team that we are caring for him and looking out for his best interest. Juliann Pulse is emotionally upset expressing family dynamics and abandonment of patient by Letta Median and his wife. Mr. Finis was not aware that she had called and he is requesting no information or conversations be had with neither Letta Median or his wife, Vaughan Basta. He reports wishes for information to only be released or discussed with his sister, Juliann Pulse or brother, Jori Moll. He has requested some notification be placed on his chart.    I discussed the importance of continued conversation with family and their medical providers regarding overall plan of care and treatment options, ensuring decisions are within the context of the patients values and GOCs.  PLAN: Fentanyl patch 133mcg every 3 days Dilaudid 2mg  every 4-6 hours as needed for breakthrough pain Gabapentin 300mg  in the morning and 600 mg at bedtime. Ativan 0.5mg  every 8 hours as needed for anxiety Senna-S daily Miralax twice daily Lactulose as needed for moderate to severe constipation Marinol daily for appetite Highly encouraged ongoing goals  of care discussions. Patient's condition continues to decline. He is appropriate for outpatient hospice support however both he and his sister declines. However sister has asked what to do if he passes away in the home. He would not want any life-sustaining measures. I will plan a phone visit in 2 weeks.   Patient expressed understanding and  was in agreement with this plan. He also understands that He can call the clinic at any time with any questions, concerns, or complaints.   Time Total: 50 min.   Visit consisted of counseling and education dealing with the complex and emotionally intense issues of symptom management and palliative care in the setting of serious and potentially life-threatening illness.Greater than 50%  of this time was spent counseling and coordinating care related to the above assessment and plan.  Signed by: Alda Lea, AGPCNP-BC [place]

## 2021-03-18 NOTE — Progress Notes (Signed)
Ordered placed for a BSC to [employer] through [facility].

## 2021-03-19 ENCOUNTER — Telehealth: Payer: Self-pay

## 2021-03-19 ENCOUNTER — Other Ambulatory Visit: Payer: Self-pay | Admitting: Nurse Practitioner

## 2021-03-19 ENCOUNTER — Telehealth: Payer: Self-pay | Admitting: Nurse Practitioner

## 2021-03-19 DIAGNOSIS — K5900 Constipation, unspecified: Secondary | ICD-10-CM

## 2021-03-19 MED ORDER — TAMSULOSIN HCL 0.4 MG PO CAPS: 0.4000 mg | ORAL_CAPSULE | Freq: Every day | ORAL | 1 refills | Status: AC

## 2021-03-19 MED ORDER — LACTULOSE 10 GM/15ML PO SOLN: 10.0000 g | Freq: Every day | ORAL | 0 refills | Status: AC | PRN

## 2021-03-19 NOTE — Telephone Encounter (Signed)
Ryan Davis called our office to ask about what medications [name], NP sent in for him after his appt yesterday. I explained that he should be able to pick up his Flomax, Ativan (to help with anxiety), and his Lactulose (to help with BMs). Understanding verbalized. All questions answered. Advised to call our office with any questions/concerns.

## 2021-03-19 NOTE — Telephone Encounter (Signed)
Scheduled per 2/20 los, pt has been called and confirmed appt, will also mail calender per pt request

## 2021-03-25 ENCOUNTER — Inpatient Hospital Stay (HOSPITAL_COMMUNITY)
Admission: EM | Admit: 2021-03-25 | Discharge: 2021-03-28 | DRG: 948 | Disposition: A | Discharge status: Hospice | Payer: Medicare Other | Attending: Family Medicine | Admitting: Family Medicine

## 2021-03-25 ENCOUNTER — Other Ambulatory Visit: Payer: Self-pay

## 2021-03-25 ENCOUNTER — Emergency Department (HOSPITAL_COMMUNITY): Payer: Medicare Other

## 2021-03-25 ENCOUNTER — Encounter (HOSPITAL_COMMUNITY): Payer: Self-pay

## 2021-03-25 DIAGNOSIS — R109 Unspecified abdominal pain: Secondary | ICD-10-CM | POA: Diagnosis not present

## 2021-03-25 DIAGNOSIS — C61 Malignant neoplasm of prostate: Secondary | ICD-10-CM | POA: Diagnosis present

## 2021-03-25 DIAGNOSIS — Z79899 Other long term (current) drug therapy: Secondary | ICD-10-CM

## 2021-03-25 DIAGNOSIS — I480 Paroxysmal atrial fibrillation: Secondary | ICD-10-CM | POA: Diagnosis present

## 2021-03-25 DIAGNOSIS — G893 Neoplasm related pain (acute) (chronic): Principal | ICD-10-CM | POA: Diagnosis present

## 2021-03-25 DIAGNOSIS — R339 Retention of urine, unspecified: Secondary | ICD-10-CM

## 2021-03-25 DIAGNOSIS — Z681 Body mass index (BMI) 19 or less, adult: Secondary | ICD-10-CM

## 2021-03-25 DIAGNOSIS — C799 Secondary malignant neoplasm of unspecified site: Secondary | ICD-10-CM | POA: Diagnosis present

## 2021-03-25 DIAGNOSIS — Z91041 Radiographic dye allergy status: Secondary | ICD-10-CM

## 2021-03-25 DIAGNOSIS — E538 Deficiency of other specified B group vitamins: Secondary | ICD-10-CM

## 2021-03-25 DIAGNOSIS — C7951 Secondary malignant neoplasm of bone: Secondary | ICD-10-CM | POA: Diagnosis present

## 2021-03-25 DIAGNOSIS — I701 Atherosclerosis of renal artery: Secondary | ICD-10-CM

## 2021-03-25 DIAGNOSIS — G894 Chronic pain syndrome: Secondary | ICD-10-CM

## 2021-03-25 DIAGNOSIS — H5500 Unspecified nystagmus: Secondary | ICD-10-CM

## 2021-03-25 DIAGNOSIS — D630 Anemia in neoplastic disease: Secondary | ICD-10-CM | POA: Diagnosis present

## 2021-03-25 DIAGNOSIS — Z87891 Personal history of nicotine dependence: Secondary | ICD-10-CM

## 2021-03-25 DIAGNOSIS — D649 Anemia, unspecified: Secondary | ICD-10-CM | POA: Diagnosis present

## 2021-03-25 DIAGNOSIS — Z7901 Long term (current) use of anticoagulants: Secondary | ICD-10-CM

## 2021-03-25 DIAGNOSIS — I82402 Acute embolism and thrombosis of unspecified deep veins of left lower extremity: Secondary | ICD-10-CM | POA: Diagnosis present

## 2021-03-25 DIAGNOSIS — I82512 Chronic embolism and thrombosis of left femoral vein: Secondary | ICD-10-CM

## 2021-03-25 DIAGNOSIS — Z515 Encounter for palliative care: Secondary | ICD-10-CM

## 2021-03-25 DIAGNOSIS — R64 Cachexia: Secondary | ICD-10-CM | POA: Diagnosis present

## 2021-03-25 DIAGNOSIS — Z923 Personal history of irradiation: Secondary | ICD-10-CM

## 2021-03-25 DIAGNOSIS — Z832 Family history of diseases of the blood and blood-forming organs and certain disorders involving the immune mechanism: Secondary | ICD-10-CM

## 2021-03-25 DIAGNOSIS — R1084 Generalized abdominal pain: Secondary | ICD-10-CM

## 2021-03-25 DIAGNOSIS — Z823 Family history of stroke: Secondary | ICD-10-CM

## 2021-03-25 DIAGNOSIS — D696 Thrombocytopenia, unspecified: Secondary | ICD-10-CM | POA: Diagnosis present

## 2021-03-25 DIAGNOSIS — E876 Hypokalemia: Secondary | ICD-10-CM | POA: Diagnosis present

## 2021-03-25 DIAGNOSIS — Z86718 Personal history of other venous thrombosis and embolism: Secondary | ICD-10-CM

## 2021-03-25 DIAGNOSIS — E861 Hypovolemia: Secondary | ICD-10-CM | POA: Diagnosis present

## 2021-03-25 DIAGNOSIS — E871 Hypo-osmolality and hyponatremia: Secondary | ICD-10-CM | POA: Diagnosis present

## 2021-03-25 LAB — URINALYSIS, ROUTINE W REFLEX MICROSCOPIC
Bilirubin Urine: NEGATIVE
Glucose, UA: NEGATIVE mg/dL
Hgb urine dipstick: NEGATIVE
Ketones, ur: 5 mg/dL — AB
Leukocytes,Ua: NEGATIVE
Nitrite: NEGATIVE
Protein, ur: 100 mg/dL — AB
Specific Gravity, Urine: 1.025 (ref 1.005–1.030)
pH: 5 (ref 5.0–8.0)

## 2021-03-25 LAB — CBC WITH DIFFERENTIAL/PLATELET
Abs Immature Granulocytes: 0.66 10*3/uL — ABNORMAL HIGH (ref 0.00–0.07)
Basophils Absolute: 0 10*3/uL (ref 0.0–0.1)
Basophils Relative: 0 %
Eosinophils Absolute: 0 10*3/uL (ref 0.0–0.5)
Eosinophils Relative: 0 %
HCT: 20.9 % — ABNORMAL LOW (ref 39.0–52.0)
Hemoglobin: 6.8 g/dL — CL (ref 13.0–17.0)
Immature Granulocytes: 7 %
Lymphocytes Relative: 34 %
Lymphs Abs: 3.4 10*3/uL (ref 0.7–4.0)
MCH: 29.8 pg (ref 26.0–34.0)
MCHC: 32.5 g/dL (ref 30.0–36.0)
MCV: 91.7 fL (ref 80.0–100.0)
Monocytes Absolute: 1.2 10*3/uL — ABNORMAL HIGH (ref 0.1–1.0)
Monocytes Relative: 12 %
Neutro Abs: 4.8 10*3/uL (ref 1.7–7.7)
Neutrophils Relative %: 47 %
Platelets: 69 10*3/uL — ABNORMAL LOW (ref 150–400)
RBC: 2.28 MIL/uL — ABNORMAL LOW (ref 4.22–5.81)
RDW: 19.9 % — ABNORMAL HIGH (ref 11.5–15.5)
WBC: 10.1 10*3/uL (ref 4.0–10.5)
nRBC: 5 % — ABNORMAL HIGH (ref 0.0–0.2)

## 2021-03-25 LAB — BASIC METABOLIC PANEL
Anion gap: 13 (ref 5–15)
BUN: 9 mg/dL (ref 6–20)
CO2: 22 mmol/L (ref 22–32)
Calcium: 8.4 mg/dL — ABNORMAL LOW (ref 8.9–10.3)
Chloride: 97 mmol/L — ABNORMAL LOW (ref 98–111)
Creatinine, Ser: 0.59 mg/dL — ABNORMAL LOW (ref 0.61–1.24)
GFR, Estimated: >60 mL/min (ref >60)
Glucose, Bld: 123 mg/dL — ABNORMAL HIGH (ref 70–99)
Potassium: 3 mmol/L — ABNORMAL LOW (ref 3.5–5.1)
Sodium: 132 mmol/L — ABNORMAL LOW (ref 135–145)

## 2021-03-25 LAB — LIPASE, BLOOD: Lipase: 20 U/L (ref 11–51)

## 2021-03-25 LAB — HEPATIC FUNCTION PANEL
ALT: 20 U/L (ref 0–44)
AST: 21 U/L (ref 15–41)
Albumin: 3.4 g/dL — ABNORMAL LOW (ref 3.5–5.0)
Alkaline Phosphatase: 744 U/L — ABNORMAL HIGH (ref 38–126)
Bilirubin, Direct: 0.1 mg/dL (ref 0.0–0.2)
Indirect Bilirubin: 0.3 mg/dL (ref 0.3–0.9)
Total Bilirubin: 0.4 mg/dL (ref 0.3–1.2)
Total Protein: 7.1 g/dL (ref 6.5–8.1)

## 2021-03-25 LAB — LACTIC ACID, PLASMA
Lactic Acid, Venous: 3 mmol/L (ref 0.5–1.9)
Lactic Acid, Venous: 2.2 mmol/L (ref 0.5–1.9)

## 2021-03-25 LAB — POC OCCULT BLOOD, ED: Fecal Occult Bld: NEGATIVE

## 2021-03-25 LAB — MAGNESIUM: Magnesium: 2 mg/dL (ref 1.7–2.4)

## 2021-03-25 MED ORDER — HYDROMORPHONE HCL 1 MG/ML IJ SOLN
1.0000 mg | Freq: Once | INTRAMUSCULAR | Status: AC
Start: 2021-03-25 — End: 2021-03-25
  Administered 2021-03-25: 1 mg via INTRAVENOUS
  Filled 2021-03-25: qty 1

## 2021-03-25 MED ORDER — POTASSIUM CHLORIDE 10 MEQ/100ML IV SOLN
10.0000 meq | INTRAVENOUS | Status: AC
  Administered 2021-03-25 (×5): 10 meq via INTRAVENOUS
  Filled 2021-03-25 (×5): qty 100

## 2021-03-25 MED ORDER — DIPHENHYDRAMINE HCL 25 MG PO CAPS: 50.0000 mg | ORAL_CAPSULE | Freq: Once | ORAL | Status: AC

## 2021-03-25 MED ORDER — HYDROMORPHONE HCL 1 MG/ML IJ SOLN
1.0000 mg | Freq: Once | INTRAMUSCULAR | Status: AC
  Administered 2021-03-25: 1 mg via INTRAVENOUS
  Filled 2021-03-25: qty 1

## 2021-03-25 MED ORDER — SODIUM CHLORIDE 0.9 % IV SOLN: 10.0000 mL/h | Freq: Once | INTRAVENOUS | Status: DC

## 2021-03-25 MED ORDER — DIPHENHYDRAMINE HCL 50 MG/ML IJ SOLN
50.0000 mg | Freq: Once | INTRAMUSCULAR | Status: AC
  Administered 2021-03-25: 50 mg via INTRAVENOUS
  Filled 2021-03-25: qty 1

## 2021-03-25 MED ORDER — LACTATED RINGERS IV BOLUS
1000.0000 mL | Freq: Once | INTRAVENOUS | Status: AC
  Administered 2021-03-25: 1000 mL via INTRAVENOUS

## 2021-03-25 MED ORDER — HYDROCORTISONE SOD SUC (PF) 250 MG IJ SOLR
200.0000 mg | Freq: Once | INTRAMUSCULAR | Status: AC
  Administered 2021-03-25: 200 mg via INTRAVENOUS
  Filled 2021-03-25: qty 200

## 2021-03-25 MED ORDER — IOHEXOL 350 MG/ML SOLN
100.0000 mL | Freq: Once | INTRAVENOUS | Status: AC | PRN
Start: 2021-03-25 — End: 2021-03-25
  Administered 2021-03-25: 100 mL via INTRAVENOUS

## 2021-03-25 MED ORDER — FENTANYL CITRATE PF 50 MCG/ML IJ SOSY
50.0000 ug | PREFILLED_SYRINGE | Freq: Once | INTRAMUSCULAR | Status: AC
Start: 2021-03-25 — End: 2021-03-25
  Administered 2021-03-25: 50 ug via INTRAVENOUS
  Filled 2021-03-25: qty 1

## 2021-03-25 MED ORDER — ONDANSETRON HCL 4 MG/2ML IJ SOLN
4.0000 mg | Freq: Once | INTRAMUSCULAR | Status: AC
  Administered 2021-03-25: 4 mg via INTRAVENOUS
  Filled 2021-03-25: qty 2

## 2021-03-25 NOTE — ED Provider Notes (Addendum)
[place] DEPT Provider Note   CSN: [id number] Arrival date & time: 03/25/21  1731     History  Chief Complaint  Patient presents with   Urinary Retention    Ryan Davis is a 61 y.o. male.  HPI  61 year old male with medical history significant for metastatic prostate cancer with chronic pain, presenting to the emergency department with acute abdominal pain and inability to urinate.  The patient states that he has had severe pain in the lower abdomen that is worsened over the past few days.  He has had difficulty voiding since this morning.  He endorses lightheadedness.  He denies any chest pain or shortness of breath.  The patient's abdominal pain is sharp and severe.  He endorses bilateral lower back pain.  Bladder scan was performed on patient arrival due to concern for acute urinary retention with greater than 500 cc in the bladder noted.  A Foley catheter was placed by nursing on patient arrival with subsequent return of urine.  Home Medications Prior to Admission medications   Medication Sig Start Date End Date Taking? Authorizing Provider  APIXABAN (ELIQUIS) VTE STARTER PACK (10MG  AND 5MG ) Take 2 tablets (10mg ) by mouth twice daily for 7 days, then take 1 tablet (5 mg) twice daily. Start date: 02/15/21  Stop date: TBD. 02/15/21   [provider]  cephALEXin (KEFLEX) 500 MG capsule Take 1 capsule (500 mg total) by mouth 4 (four) times daily. 02/07/21   Valarie Merino, MD  dronabinol (MARINOL) 5 MG capsule Take 1 capsule (5 mg total) by mouth 2 (two) times daily before a meal. 02/14/21   Pickenpack-Cousar, Carlena Sax, NP  ELIQUIS DVT/PE STARTER PACK Take 1 tablet by mouth as directed. 02/15/21   [provider]  famotidine (PEPCID) 40 MG tablet Take 1 tablet (40 mg total) by mouth 2 (two) times daily. 09/27/20   [name], MD  fentaNYL (DURAGESIC) 100 MCG/HR Place 1 patch onto the skin every other day. 03/01/21   Celso Amy,  NP  gabapentin (NEURONTIN) 300 MG capsule Take 1 capsule in the morning (300 mg) and take 2 capsules (600 mg) at bedtime. 03/11/21   Pickenpack-Cousar, Carlena Sax, NP  HYDROmorphone (DILAUDID) 2 MG tablet Take 1 tablet (2 mg total) by mouth every 6 (six) hours as needed for severe pain or moderate pain. 03/11/21   Pickenpack-Cousar, Carlena Sax, NP  lactulose (CHRONULAC) 10 GM/15ML solution Take 15 mLs (10 g total) by mouth daily as needed for mild constipation, severe constipation or moderate constipation. 03/19/21   Pickenpack-Cousar, Carlena Sax, NP  LORazepam (ATIVAN) 0.5 MG tablet Take 1 tablet (0.5 mg total) by mouth every 8 (eight) hours as needed for anxiety. 03/18/21   Pickenpack-Cousar, Carlena Sax, NP  metoprolol tartrate (LOPRESSOR) 25 MG tablet TAKE 1/2 TABLET(12.5 MG) BY MOUTH TWICE DAILY Patient taking differently: Take 12.5 mg by mouth 2 (two) times daily. 09/24/20   [name], MD  Naloxone HCl 0.4 MG/0.4ML SOAJ Use in the event of accidental overdose of opioids. 08/26/20   Arnaldo Natal, MD  pantoprazole (PROTONIX) 40 MG tablet Take 1 tablet (40 mg total) by mouth daily. 03/11/21   Pickenpack-Cousar, Carlena Sax, NP  Plecanatide (TRULANCE) 3 MG TABS Take 3 mg by mouth daily. 10/24/20   [provider]  polyethylene glycol (MIRALAX / GLYCOLAX) 17 g packet Take 17 g by mouth 2 (two) times daily. 10/21/20   Shelly Coss, MD  senna (SENOKOT) 8.6 MG  TABS tablet Take 2 tablets (17.2 mg total) by mouth 2 (two) times daily. 10/21/20   Shelly Coss, MD  tamsulosin (FLOMAX) 0.4 MG CAPS capsule Take 1 capsule (0.4 mg total) by mouth daily. 03/19/21   Pickenpack-Cousar, Carlena Sax, NP  prochlorperazine (COMPAZINE) 10 MG tablet Take 1 tablet (10 mg total) by mouth every 6 (six) hours as needed (Nausea or vomiting). Patient not taking: Reported on 11/12/2020 09/06/20 11/12/20  [name], MD      Allergies    Contrast media [iodinated contrast media] and Other    Review of Systems   Review  of Systems  Unable to perform ROS: Acuity of condition   Physical Exam Updated Vital Signs BP (!) 142/85 (BP Location: Left Arm)    Pulse 74    Temp 98.2 F (36.8 C) (Oral)    Resp 16    Ht 6\' 2"  (1.88 m)    Wt 52.6 kg    SpO2 100%    BMI 14.89 kg/m  Physical Exam Vitals and nursing note reviewed. Exam conducted with a chaperone present.  Constitutional:      General: He is in acute distress.     Comments: Patient alert, in acute distress, writhing in pain on the stretcher.  Patient cachectic appearing, thin and frail  HENT:     Head: Normocephalic and atraumatic.     Right Ear: There is impacted cerumen.     Left Ear: There is impacted cerumen.  Eyes:     Conjunctiva/sclera: Conjunctivae normal.     Pupils: Pupils are equal, round, and reactive to light.  Cardiovascular:     Rate and Rhythm: Regular rhythm. Tachycardia present.     Pulses: Normal pulses.  Pulmonary:     Effort: Pulmonary effort is normal. No respiratory distress.  Abdominal:     General: There is no distension.     Tenderness: There is abdominal tenderness. There is guarding. There is no right CVA tenderness, left CVA tenderness or rebound.     Hernia: No hernia is present.  Genitourinary:    Rectum: Normal. Guaiac result negative. No tenderness, anal fissure, external hemorrhoid or internal hemorrhoid.  Musculoskeletal:        General: No deformity or signs of injury.     Cervical back: Neck supple.  Skin:    Findings: No lesion or rash.  Neurological:     General: No focal deficit present.     Mental Status: He is alert. Mental status is at baseline.    ED Results / Procedures / Treatments   Labs (all labs ordered are listed, but only abnormal results are displayed) Labs Reviewed  URINALYSIS, ROUTINE W REFLEX MICROSCOPIC - Abnormal; Notable for the following components:      Result Value   Color, Urine AMBER (*)    APPearance CLOUDY (*)    Ketones, ur 5 (*)    Protein, ur 100 (*)    Bacteria, UA  RARE (*)    All other components within normal limits  BASIC METABOLIC PANEL - Abnormal; Notable for the following components:   Sodium 132 (*)    Potassium 3.0 (*)    Chloride 97 (*)    Glucose, Bld 123 (*)    Creatinine, Ser 0.59 (*)    Calcium 8.4 (*)    All other components within normal limits  CBC WITH DIFFERENTIAL/PLATELET - Abnormal; Notable for the following components:   RBC 2.28 (*)    Hemoglobin 6.8 (*)  HCT 20.9 (*)    RDW 19.9 (*)    Platelets 69 (*)    nRBC 5.0 (*)    Monocytes Absolute 1.2 (*)    Abs Immature Granulocytes 0.66 (*)    All other components within normal limits  LACTIC ACID, PLASMA - Abnormal; Notable for the following components:   Lactic Acid, Venous 3.0 (*)    All other components within normal limits  LACTIC ACID, PLASMA - Abnormal; Notable for the following components:   Lactic Acid, Venous 2.2 (*)    All other components within normal limits  HEPATIC FUNCTION PANEL - Abnormal; Notable for the following components:   Albumin 3.4 (*)    Alkaline Phosphatase 744 (*)    All other components within normal limits  LIPASE, BLOOD  MAGNESIUM  COMPREHENSIVE METABOLIC PANEL  CBC  VITAMIN B12  FOLATE  IRON AND TIBC  FERRITIN  RETICULOCYTES  POC OCCULT BLOOD, ED  PREPARE RBC (CROSSMATCH)  TYPE AND SCREEN  ABO/RH    EKG None  Radiology CT Angio Abd/Pel W and/or Wo Contrast  Result Date: 03/25/2021 CLINICAL DATA:  Acute mesenteric ischemia, urinary retention EXAM: CTA ABDOMEN AND PELVIS WITHOUT AND WITH CONTRAST TECHNIQUE: Multidetector CT imaging of the abdomen and pelvis was performed using the standard protocol during bolus administration of intravenous contrast. Multiplanar reconstructed images and MIPs were obtained and reviewed to evaluate the vascular anatomy. RADIATION DOSE REDUCTION: This exam was performed according to the departmental dose-optimization program which includes automated exposure control, adjustment of the mA and/or kV  according to patient size and/or use of iterative reconstruction technique. CONTRAST:  128mL OMNIPAQUE IOHEXOL 350 MG/ML SOLN COMPARISON:  CT abdomen pelvis 02/21/2021 FINDINGS: VASCULAR Aorta: Normal caliber aorta without aneurysm, dissection, vasculitis or significant stenosis. Celiac: Patent without evidence of aneurysm, dissection, vasculitis or significant stenosis. SMA: Patent without evidence of aneurysm, dissection, vasculitis or significant stenosis. Renals: Single right and dual left renal arteries are noted. There is a high-grade focal stenosis involving the proximal aspect of the main left renal artery. No significant atherosclerotic plaque is identified in this location. This may reflect changes of adventitial or Peri adventitial fibromuscular dysplasia, neurofibromatosis, or represent the remote sequela of large vessel vasculitis. No aneurysm or dissection. IMA: Patent without evidence of aneurysm, dissection, vasculitis or significant stenosis. Inflow: Patent without evidence of aneurysm, dissection, vasculitis or significant stenosis. Proximal Outflow: Bilateral common femoral and visualized portions of the superficial and profunda femoral arteries are patent without evidence of aneurysm, dissection, vasculitis or significant stenosis. Veins: The venous vasculature is unremarkable. Specifically, the superior and inferior mesenteric vein is, splenic veins, and portal vein are patent. Review of the MIP images confirms the above findings. NON-VASCULAR Lower chest: No acute abnormality. Hepatobiliary: No focal liver abnormality is seen. No gallstones, gallbladder wall thickening, or biliary dilatation. Pancreas: Stable 3.2 cm cyst within the tail the pancreas within the splenic hilum. The pancreas is otherwise unremarkable. Spleen: Unremarkable Adrenals/Urinary Tract: The adrenal glands are unremarkable. The kidneys are normal. The Foley catheter balloon is not clearly position within the bladder lumen  and may be inflated within the prostatic urethra. The tip of the catheter, additionally, is not clearly within the bladder lumen, best seen on sagittal image # 86/14. The bladder is not distended. Stomach/Bowel: The stomach, small bowel, and large bowel are unremarkable. No free intraperitoneal gas or fluid. Lymphatic: No pathologic adenopathy within the abdomen and pelvis. Reproductive: The prostate gland is otherwise unremarkable. Other: There is marked body wall wasting  with diffuse infiltration of the subcutaneous and retroperitoneal fat in keeping with anasarca, possibly related to hypoproteinemia Musculoskeletal: Diffuse sclerotic metastases are seen throughout the visualized axial skeleton in keeping with the patient's given history of prostate cancer. 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 See differential considerations above. Correlation for signs and symptoms of clinically significant renal artery stenosis is recommended. No radiologic evidence of acute or chronic mesenteric ischemia. NON-VASCULAR Foley catheter balloon is not clearly position within the bladder lumen and may be within the prostatic urethra. Clinical correlation is suggested. The bladder does not appear distended, however. Marked body wall wasting, similar to prior examination, with diffuse infiltration of the subcutaneous and retroperitoneal fat possibly related to hypoproteinemia and resultant anasarca. Diffuse osseous sclerotic metastases in keeping with the patient's given history of prostate cancer. Electronically Signed   By: Fidela Salisbury M.D.   On: 03/25/2021 23:14    Procedures .Critical Care Performed by: Regan Lemming, MD Authorized by: Regan Lemming, MD   Critical care provider statement:    Critical care time (minutes):  80   Critical care was time spent personally by me on the following activities:  Ordering and review of radiographic  studies, ordering and review of laboratory studies, ordering and performing treatments and interventions, pulse oximetry, review of old charts, re-evaluation of patient's condition, obtaining history from patient or surrogate, examination of patient, evaluation of patient's response to treatment and development of treatment plan with patient or surrogate   Care discussed with: admitting provider      Medications Ordered in ED Medications  0.9 %  sodium chloride infusion (0 mL/hr Intravenous Hold 03/25/21 2348)  HYDROmorphone (DILAUDID) injection 1 mg (1 mg Intravenous Given 03/26/21 0144)  fentaNYL (DURAGESIC) 100 MCG/HR 1 patch (has no administration in time range)  metoprolol tartrate (LOPRESSOR) tablet 12.5 mg (has no administration in time range)  tamsulosin (FLOMAX) capsule 0.4 mg (has no administration in time range)  gabapentin (NEURONTIN) capsule 600 mg (600 mg Oral Given 03/26/21 0145)  pantoprazole (PROTONIX) EC tablet 40 mg (has no administration in time range)  LORazepam (ATIVAN) tablet 0.5 mg (has no administration in time range)  apixaban (ELIQUIS) tablet 5 mg (has no administration in time range)  acetaminophen (TYLENOL) tablet 650 mg (has no administration in time range)    Or  acetaminophen (TYLENOL) suppository 650 mg (has no administration in time range)  senna (SENOKOT) tablet 8.6 mg (has no administration in time range)  0.9 %  sodium chloride infusion ( Intravenous New Bag/Given 03/26/21 0056)  gabapentin (NEURONTIN) capsule 300 mg (has no administration in time range)  polyethylene glycol (MIRALAX / GLYCOLAX) packet 17 g (has no administration in time range)  lactulose (CHRONULAC) 10 GM/15ML solution 20 g (has no administration in time range)  lactated ringers bolus 1,000 mL (0 mLs Intravenous Stopped 03/25/21 2040)  ondansetron (ZOFRAN) injection 4 mg (4 mg Intravenous Given 03/25/21 1833)  HYDROmorphone (DILAUDID) injection 1 mg (1 mg Intravenous Given 03/25/21 1833)   hydrocortisone sodium succinate (SOLU-CORTEF) injection 200 mg (200 mg Intravenous Given 03/25/21 1854)  diphenhydrAMINE (BENADRYL) capsule 50 mg ( Oral See Alternative 03/25/21 2128)    Or  diphenhydrAMINE (BENADRYL) injection 50 mg (50 mg Intravenous Given 03/25/21 2128)  fentaNYL (SUBLIMAZE) injection 50 mcg (50 mcg Intravenous Given 03/25/21 1916)  potassium chloride 10 mEq in 100 mL IVPB (0 mEq Intravenous Stopped 03/26/21 0042)  HYDROmorphone (DILAUDID) injection 1 mg (1 mg  Intravenous Given 03/25/21 2035)  iohexol (OMNIPAQUE) 350 MG/ML injection 100 mL (100 mLs Intravenous Contrast Given 03/25/21 2234)    ED Course/ Medical Decision Making/ A&P Clinical Course as of 03/26/21 0206  Mon Mar 25, 2021  2048 Hemoglobin(!!): 6.8 [JL]  2048 Lactic Acid, Venous(!!): 3.0 [JL]  2150 Lactic Acid, Venous(!!): 2.2 [JL]    Clinical Course User Index [JL] Regan Lemming, MD                           Medical Decision Making Amount and/or Complexity of Data Reviewed Labs: ordered. Decision-making details documented in ED Course. Radiology: ordered.  Risk Prescription drug management. Decision regarding hospitalization.   61 year old male with medical history significant for metastatic prostate cancer with chronic pain, presenting to the emergency department with acute abdominal pain and inability to urinate.  The patient states that he has had severe pain in the lower abdomen that is worsened over the past few days.  He has had difficulty voiding since this morning.  He endorses lightheadedness.  He denies any chest pain or shortness of breath.  The patient's abdominal pain is sharp and severe.  He endorses bilateral lower back pain.  Bladder scan was performed on patient arrival due to concern for acute urinary retention with greater than 500 cc in the bladder noted.  A Foley catheter was placed by nursing on patient arrival with subsequent return of urine.  On arrival, the patient was afebrile,  hypertensive BP 155/97, tachycardic P114, sinus tachycardia noted on cardiac telemetry, not tachypneic, saturating well on room air.  Physical exam significant for generalized abdominal tenderness to palpation, cachectic and frail-appearing male.  Differential diagnosis includes mesenteric ischemia, small bowel obstruction, intra-abdominal abscess, nephrolithiasis/ureterolithiasis, urinary retention, bowel perforation, constipation, other acute intra-abdominal emergency.  The patient was noted to have an IV contrast allergy with nausea and vomiting immediately after IV contrast administration.  He was administered Solu-Cortef and Benadryl for premedication and a CTA abdomen pelvis was ordered.  Foley catheter was placed by nursing bedside on patient arrival with return of urine and some relief of patient's symptoms of the patient's severe pain did persist.  Initial lactic acid was found to be elevated at 3.0.  The patient was administered IV fluids.  A rectal exam was performed which was Fecal occult negative with no obvious melena or hematochezia.  In addition, the patient did complain of cerumen impaction bilaterally.  Removal of cerumen was performed by nursing bedside.  The patient presents with new onset anemia of unclear etiology as his initial CBC revealed hemoglobin of 6.8, down from 10 3 weeks ago.  BMP revealed hypokalemia to 3.0.  Repeat lactic acid following an IV fluid bolus was improved to 2.2.  Urinalysis was performed revealed rare bacteria, negative WBCs, negative nitrites and leukocytes.  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 See differential considerations  above. Correlation for signs and symptoms of clinically significant  renal artery stenosis is recommended.     No radiologic evidence of acute or chronic mesenteric ischemia.     NON-VASCULAR     Foley  catheter balloon is not clearly position within the bladder  lumen and may be within the prostatic urethra. Clinical correlation  is suggested. The bladder does not appear distended, however.     Marked body wall wasting, similar to  prior examination, with diffuse  infiltration of the subcutaneous and retroperitoneal fat possibly  related to hypoproteinemia and resultant anasarca.     Diffuse osseous sclerotic metastases in keeping with the patient's  given history of prostate cancer.       Possible focal stenosis of the left renal artery could be the etiology of the patient's discomfort.  The patient's Foley catheter was noted to be potentially malpositioned not clearly within the bladder lumen and may be within the prostatic urethra.  Nursing did attempt to advance the Foley catheter.  He continues to drain clear urine. The patient was consented for blood product transfusion given his symptomatic anemia with a hemoglobin of 6.8.  1 unit PRBCs was ordered.  Given his acute symptomatic anemia requiring blood transfusion, hospitalist medicine was consulted for admission.  Dr. Myna Hidalgo of hospitalist medicine accepted the patient in admission.  I discussed the plan of care with the patient and family members bedside to include telephone conversations with additional family members.  On reassessment, the patient's pain was improved following IV Dilaudid and fentanyl.  Subsequently admitted in stable condition.    Final Clinical Impression(s) / ED Diagnoses Final diagnoses:  Urinary retention  Malignant neoplasm of prostate metastatic to bone [facility])  Generalized abdominal pain  Anemia, unspecified type    Rx / DC Orders ED Discharge Orders     None         Regan Lemming, MD 03/26/21 1478    Regan Lemming, MD 03/26/21 [phone]

## 2021-03-25 NOTE — ED Triage Notes (Signed)
Pt c/o urinary retention, pt states he last voided yesterday morning around 1030.  Pt states he has not been able to void since. Pt hx stage 4 prostate cancer.

## 2021-03-25 NOTE — ED Notes (Signed)
Patient continues to take off cardiac monitor, reminded the importance of keeping the leads on but patient states he cant stay still.

## 2021-03-26 ENCOUNTER — Inpatient Hospital Stay (HOSPITAL_COMMUNITY): Payer: Medicare Other

## 2021-03-26 ENCOUNTER — Encounter (HOSPITAL_COMMUNITY): Payer: Self-pay | Admitting: Family Medicine

## 2021-03-26 DIAGNOSIS — I701 Atherosclerosis of renal artery: Secondary | ICD-10-CM | POA: Diagnosis present

## 2021-03-26 DIAGNOSIS — D696 Thrombocytopenia, unspecified: Secondary | ICD-10-CM | POA: Diagnosis present

## 2021-03-26 DIAGNOSIS — Z923 Personal history of irradiation: Secondary | ICD-10-CM | POA: Diagnosis not present

## 2021-03-26 DIAGNOSIS — D630 Anemia in neoplastic disease: Secondary | ICD-10-CM | POA: Diagnosis present

## 2021-03-26 DIAGNOSIS — I82402 Acute embolism and thrombosis of unspecified deep veins of left lower extremity: Secondary | ICD-10-CM | POA: Diagnosis present

## 2021-03-26 DIAGNOSIS — E538 Deficiency of other specified B group vitamins: Secondary | ICD-10-CM

## 2021-03-26 DIAGNOSIS — D649 Anemia, unspecified: Secondary | ICD-10-CM | POA: Diagnosis present

## 2021-03-26 DIAGNOSIS — Z681 Body mass index (BMI) 19 or less, adult: Secondary | ICD-10-CM | POA: Diagnosis not present

## 2021-03-26 DIAGNOSIS — Z87891 Personal history of nicotine dependence: Secondary | ICD-10-CM | POA: Diagnosis not present

## 2021-03-26 DIAGNOSIS — Z832 Family history of diseases of the blood and blood-forming organs and certain disorders involving the immune mechanism: Secondary | ICD-10-CM | POA: Diagnosis not present

## 2021-03-26 DIAGNOSIS — Z823 Family history of stroke: Secondary | ICD-10-CM | POA: Diagnosis not present

## 2021-03-26 DIAGNOSIS — R339 Retention of urine, unspecified: Secondary | ICD-10-CM | POA: Diagnosis present

## 2021-03-26 DIAGNOSIS — Z7901 Long term (current) use of anticoagulants: Secondary | ICD-10-CM | POA: Diagnosis not present

## 2021-03-26 DIAGNOSIS — E861 Hypovolemia: Secondary | ICD-10-CM | POA: Diagnosis present

## 2021-03-26 DIAGNOSIS — R109 Unspecified abdominal pain: Secondary | ICD-10-CM | POA: Diagnosis present

## 2021-03-26 DIAGNOSIS — H5500 Unspecified nystagmus: Secondary | ICD-10-CM

## 2021-03-26 DIAGNOSIS — C7951 Secondary malignant neoplasm of bone: Secondary | ICD-10-CM | POA: Diagnosis present

## 2021-03-26 DIAGNOSIS — E871 Hypo-osmolality and hyponatremia: Secondary | ICD-10-CM | POA: Diagnosis present

## 2021-03-26 DIAGNOSIS — E876 Hypokalemia: Secondary | ICD-10-CM | POA: Diagnosis present

## 2021-03-26 DIAGNOSIS — G893 Neoplasm related pain (acute) (chronic): Secondary | ICD-10-CM | POA: Diagnosis present

## 2021-03-26 DIAGNOSIS — R1084 Generalized abdominal pain: Secondary | ICD-10-CM | POA: Diagnosis present

## 2021-03-26 DIAGNOSIS — I480 Paroxysmal atrial fibrillation: Secondary | ICD-10-CM | POA: Diagnosis present

## 2021-03-26 DIAGNOSIS — Z515 Encounter for palliative care: Secondary | ICD-10-CM | POA: Diagnosis not present

## 2021-03-26 DIAGNOSIS — C61 Malignant neoplasm of prostate: Secondary | ICD-10-CM | POA: Diagnosis present

## 2021-03-26 DIAGNOSIS — C799 Secondary malignant neoplasm of unspecified site: Secondary | ICD-10-CM | POA: Diagnosis present

## 2021-03-26 DIAGNOSIS — R64 Cachexia: Secondary | ICD-10-CM | POA: Diagnosis present

## 2021-03-26 DIAGNOSIS — Z91041 Radiographic dye allergy status: Secondary | ICD-10-CM | POA: Diagnosis not present

## 2021-03-26 DIAGNOSIS — Z86718 Personal history of other venous thrombosis and embolism: Secondary | ICD-10-CM | POA: Diagnosis not present

## 2021-03-26 LAB — PREPARE RBC (CROSSMATCH)

## 2021-03-26 LAB — COMPREHENSIVE METABOLIC PANEL
ALT: 20 U/L (ref 0–44)
AST: 23 U/L (ref 15–41)
Albumin: 3.3 g/dL — ABNORMAL LOW (ref 3.5–5.0)
Alkaline Phosphatase: 748 U/L — ABNORMAL HIGH (ref 38–126)
Anion gap: 9 (ref 5–15)
BUN: 8 mg/dL (ref 6–20)
CO2: 25 mmol/L (ref 22–32)
Calcium: 8 mg/dL — ABNORMAL LOW (ref 8.9–10.3)
Chloride: 102 mmol/L (ref 98–111)
Creatinine, Ser: 0.49 mg/dL — ABNORMAL LOW (ref 0.61–1.24)
GFR, Estimated: >60 mL/min (ref >60)
Glucose, Bld: 134 mg/dL — ABNORMAL HIGH (ref 70–99)
Potassium: 3.6 mmol/L (ref 3.5–5.1)
Sodium: 136 mmol/L (ref 135–145)
Total Bilirubin: 0.6 mg/dL (ref 0.3–1.2)
Total Protein: 6.7 g/dL (ref 6.5–8.1)

## 2021-03-26 LAB — HEMOGLOBIN AND HEMATOCRIT, BLOOD
HCT: 25 % — ABNORMAL LOW (ref 39.0–52.0)
Hemoglobin: 8.3 g/dL — ABNORMAL LOW (ref 13.0–17.0)

## 2021-03-26 LAB — CBC
HCT: 20.5 % — ABNORMAL LOW (ref 39.0–52.0)
Hemoglobin: 6.8 g/dL — CL (ref 13.0–17.0)
MCH: 30.2 pg (ref 26.0–34.0)
MCHC: 33.2 g/dL (ref 30.0–36.0)
MCV: 91.1 fL (ref 80.0–100.0)
Platelets: 66 10*3/uL — ABNORMAL LOW (ref 150–400)
RBC: 2.25 MIL/uL — ABNORMAL LOW (ref 4.22–5.81)
RDW: 19.8 % — ABNORMAL HIGH (ref 11.5–15.5)
WBC: 9.6 10*3/uL (ref 4.0–10.5)
nRBC: 5.4 % — ABNORMAL HIGH (ref 0.0–0.2)

## 2021-03-26 LAB — FOLATE: Folate: 5.2 ng/mL — ABNORMAL LOW (ref >5.9)

## 2021-03-26 LAB — RETICULOCYTES
Immature Retic Fract: 37.1 % — ABNORMAL HIGH (ref 2.3–15.9)
RBC.: 2.22 MIL/uL — ABNORMAL LOW (ref 4.22–5.81)
Retic Count, Absolute: 39.7 10*3/uL (ref 19.0–186.0)
Retic Ct Pct: 1.8 % (ref 0.4–3.1)

## 2021-03-26 LAB — IRON AND TIBC
Iron: 89 ug/dL (ref 45–182)
Saturation Ratios: 49 % — ABNORMAL HIGH (ref 17.9–39.5)
TIBC: 181 ug/dL — ABNORMAL LOW (ref 250–450)
UIBC: 92 ug/dL

## 2021-03-26 LAB — VITAMIN B12: Vitamin B-12: 597 pg/mL (ref 180–914)

## 2021-03-26 LAB — ABO/RH: ABO/RH(D): A POS

## 2021-03-26 LAB — FERRITIN: Ferritin: 1809 ng/mL — ABNORMAL HIGH (ref 24–336)

## 2021-03-26 MED ORDER — GABAPENTIN 300 MG PO CAPS
300.0000 mg | ORAL_CAPSULE | Freq: Every day | ORAL | Status: DC
  Administered 2021-03-26 – 2021-03-28 (×3): 300 mg via ORAL
  Filled 2021-03-26 (×3): qty 1

## 2021-03-26 MED ORDER — FENTANYL 100 MCG/HR TD PT72
1.0000 | MEDICATED_PATCH | TRANSDERMAL | Status: DC
  Administered 2021-03-26 – 2021-03-28 (×2): 1 via TRANSDERMAL
  Filled 2021-03-26 (×2): qty 1

## 2021-03-26 MED ORDER — CHLORHEXIDINE GLUCONATE CLOTH 2 % EX PADS
6.0000 | MEDICATED_PAD | Freq: Every day | CUTANEOUS | Status: DC
  Administered 2021-03-26 – 2021-03-28 (×3): 6 via TOPICAL

## 2021-03-26 MED ORDER — HYDROMORPHONE HCL 1 MG/ML IJ SOLN
1.0000 mg | INTRAMUSCULAR | Status: DC | PRN
  Administered 2021-03-26: 1 mg via INTRAVENOUS
  Filled 2021-03-26 (×2): qty 1

## 2021-03-26 MED ORDER — BISACODYL 5 MG PO TBEC: 5.0000 mg | DELAYED_RELEASE_TABLET | Freq: Every day | ORAL | Status: DC | PRN

## 2021-03-26 MED ORDER — HYDROMORPHONE HCL 2 MG PO TABS
2.0000 mg | ORAL_TABLET | Freq: Four times a day (QID) | ORAL | Status: DC | PRN
  Administered 2021-03-27 (×2): 2 mg via ORAL
  Filled 2021-03-26 (×2): qty 1

## 2021-03-26 MED ORDER — PREDNISONE 5 MG PO TABS: 10.0000 mg | ORAL_TABLET | Freq: Every day | ORAL | Status: DC

## 2021-03-26 MED ORDER — LORAZEPAM 0.5 MG PO TABS
0.5000 mg | ORAL_TABLET | Freq: Three times a day (TID) | ORAL | Status: DC | PRN
  Administered 2021-03-26: 0.5 mg via ORAL
  Filled 2021-03-26: qty 1

## 2021-03-26 MED ORDER — POLYETHYLENE GLYCOL 3350 17 G PO PACK
17.0000 g | PACK | Freq: Two times a day (BID) | ORAL | Status: DC
  Administered 2021-03-26 – 2021-03-28 (×4): 17 g via ORAL
  Filled 2021-03-26 (×4): qty 1

## 2021-03-26 MED ORDER — SODIUM CHLORIDE 0.9 % IV SOLN: INTRAVENOUS | Status: AC

## 2021-03-26 MED ORDER — APIXABAN 5 MG PO TABS
5.0000 mg | ORAL_TABLET | Freq: Two times a day (BID) | ORAL | Status: DC
  Administered 2021-03-26 – 2021-03-28 (×5): 5 mg via ORAL
  Filled 2021-03-26 (×5): qty 1

## 2021-03-26 MED ORDER — POLYETHYLENE GLYCOL 3350 17 G PO PACK: 17.0000 g | PACK | Freq: Every day | ORAL | Status: DC | PRN

## 2021-03-26 MED ORDER — PREDNISONE 5 MG PO TABS: 30.0000 mg | ORAL_TABLET | Freq: Every day | ORAL | Status: DC

## 2021-03-26 MED ORDER — LACTULOSE 10 GM/15ML PO SOLN: 20.0000 g | Freq: Two times a day (BID) | ORAL | Status: DC | PRN

## 2021-03-26 MED ORDER — SODIUM CHLORIDE 0.9% FLUSH
10.0000 mL | Freq: Two times a day (BID) | INTRAVENOUS | Status: DC
  Administered 2021-03-26 – 2021-03-27 (×3): 10 mL

## 2021-03-26 MED ORDER — MECLIZINE HCL 25 MG PO TABS: 25.0000 mg | ORAL_TABLET | Freq: Three times a day (TID) | ORAL | Status: DC | PRN

## 2021-03-26 MED ORDER — GADOBUTROL 1 MMOL/ML IV SOLN
5.0000 mL | Freq: Once | INTRAVENOUS | Status: AC | PRN
  Administered 2021-03-26: 5 mL via INTRAVENOUS

## 2021-03-26 MED ORDER — METOPROLOL TARTRATE 12.5 MG HALF TABLET
12.5000 mg | ORAL_TABLET | Freq: Two times a day (BID) | ORAL | Status: DC
  Administered 2021-03-26 – 2021-03-28 (×5): 12.5 mg via ORAL
  Filled 2021-03-26 (×5): qty 1

## 2021-03-26 MED ORDER — PREDNISONE 20 MG PO TABS: 20.0000 mg | ORAL_TABLET | Freq: Every day | ORAL | Status: DC

## 2021-03-26 MED ORDER — FOLIC ACID 1 MG PO TABS
1.0000 mg | ORAL_TABLET | Freq: Every day | ORAL | Status: DC
  Administered 2021-03-26 – 2021-03-28 (×3): 1 mg via ORAL
  Filled 2021-03-26 (×3): qty 1

## 2021-03-26 MED ORDER — ACETAMINOPHEN 325 MG PO TABS
650.0000 mg | ORAL_TABLET | Freq: Four times a day (QID) | ORAL | Status: DC | PRN
  Administered 2021-03-26: 650 mg via ORAL
  Filled 2021-03-26: qty 2

## 2021-03-26 MED ORDER — SENNA 8.6 MG PO TABS
1.0000 | ORAL_TABLET | Freq: Two times a day (BID) | ORAL | Status: DC
  Administered 2021-03-26 – 2021-03-28 (×5): 8.6 mg via ORAL
  Filled 2021-03-26 (×5): qty 1

## 2021-03-26 MED ORDER — TAMSULOSIN HCL 0.4 MG PO CAPS
0.4000 mg | ORAL_CAPSULE | Freq: Every day | ORAL | Status: DC
  Administered 2021-03-26 – 2021-03-28 (×3): 0.4 mg via ORAL
  Filled 2021-03-26 (×3): qty 1

## 2021-03-26 MED ORDER — GABAPENTIN 300 MG PO CAPS
600.0000 mg | ORAL_CAPSULE | Freq: Every day | ORAL | Status: DC
  Administered 2021-03-26 – 2021-03-27 (×3): 600 mg via ORAL
  Filled 2021-03-26 (×3): qty 2

## 2021-03-26 MED ORDER — PANTOPRAZOLE SODIUM 40 MG PO TBEC
40.0000 mg | DELAYED_RELEASE_TABLET | Freq: Every day | ORAL | Status: DC
  Administered 2021-03-26 – 2021-03-28 (×3): 40 mg via ORAL
  Filled 2021-03-26 (×3): qty 1

## 2021-03-26 MED ORDER — ACETAMINOPHEN 650 MG RE SUPP: 650.0000 mg | Freq: Four times a day (QID) | RECTAL | Status: DC | PRN

## 2021-03-26 MED ORDER — PREDNISONE 20 MG PO TABS
40.0000 mg | ORAL_TABLET | Freq: Every day | ORAL | Status: DC
  Administered 2021-03-27 – 2021-03-28 (×2): 40 mg via ORAL
  Filled 2021-03-26 (×2): qty 2

## 2021-03-26 NOTE — ED Notes (Signed)
Attempted to advance catheter per CT and provider. Catheter has resistance when advancing. Continues to drain with no difficulty and patient declining any pain. EDP made aware.

## 2021-03-26 NOTE — Assessment & Plan Note (Signed)
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

## 2021-03-26 NOTE — Assessment & Plan Note (Addendum)
Resolved. Suspected to be related to urinary retention and underlying metastatic cancer. Patient is managed on chronic analgesic regimen. Continue on discharge.

## 2021-03-26 NOTE — Progress Notes (Signed)
°  Transition of Care (TOC) Screening Note   Patient Details  Name: KAREM TOMASO Date of Birth: 05-01-1960   Transition of Care [facility]) CM/SW Contact:    Lennart Pall, LCSW Phone Number: 03/26/2021, 12:00 PM    Transition of Care Department [facility]) has reviewed patient and no TOC needs have been identified at this time. We will continue to monitor patient advancement through interdisciplinary progression rounds. If new patient transition needs arise, please place a TOC consult.

## 2021-03-26 NOTE — Hospital Course (Addendum)
62 yo with hx metastatic prostate cancer, LLE DVT on eliquis, atrial fibrillation who presented with severe abdominal pain and difficulty urinating.  A foley catheter was placed with improvement of symptoms.  An MRI lumbar spine was ordered due to new urinary retention in setting of metastatic prostate cancer.  An MRI brain was ordered due to new nystagmus noted on hospital day 1.  He was also noted to be anemic and thrombocytopenic without evidence of bleeding, he was continued cautiously on his prior to admission eliquis.  Will discuss his care with Dr. Marin Olp. MRIs without etiology for retention or nystagmus. Initial plan for discharge home with hospice, however just prior to discharge, patient/family declined hospice services.

## 2021-03-26 NOTE — Progress Notes (Signed)
Pt has critical lab result Hgb-6.8. Pt is receiving one units of PRBC now which was ordered by attending MD . Also notified to on-call.

## 2021-03-26 NOTE — Assessment & Plan Note (Addendum)
Recurrent issue. Foley catheter placed this admission. MRI without neurologic etiology for retention. Prior history of obstructive disease. Patient requesting removal of foley catheter prior to discharge and was able to urinate without foley.

## 2021-03-26 NOTE — Evaluation (Signed)
Physical Therapy Evaluation Patient Details Name: Ryan Davis MRN: [id number] DOB: 1960/07/02 Today's Date: 03/26/2021  History of Present Illness  61 yo male presents with acute abdominal pain and inability to urinate. 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 s/p 1 unit PRBCs. New nystagmus noted on hospital day 1- Brain MRI 2/28: No acute intracranial pathology or evidence of intracranial metastatic disease. PMH: metastatic prostate cancer with chronic pain, afib, LLE DVT   Clinical Impression  Pt admitted with above diagnosis. Pt from home with siblings who assist as needed with self care tasks, using SPC around the home, denies recent falls. Pt currently requiring min A with rising to sit EOB, no physical assist to return to supine and reposition to comfort. Pt declines standing or ambulation, able to lateral scoot up to [facility] with min guard. Pt with AROM WNL and BLE strength 3+/5 throughout, symmetrical and with good coordination and denies numbness/tingling. Pt with noted nystagmus in all directions that doesn't change with position, pt reports difficulty focusing vision to read or see things; MD aware. Pt's sister in room at [place], reports pt normally ambulates around house with [facility], reports goal is for pt to return home and family to continue assisting as needed. Pt has SPC and BSC at home, will continue to assess for DME needs. Pt currently with functional limitations due to the deficits listed below (see PT Problem List). Pt will benefit from skilled PT to increase their independence and safety with mobility to allow discharge to the venue listed below.          Recommendations for follow up therapy are one component of a multi-disciplinary discharge planning process, led by the attending physician.  Recommendations may be updated based on patient status, additional  functional criteria and insurance authorization.  Follow Up Recommendations Home health PT    Assistance Recommended at Discharge Frequent or constant Supervision/Assistance  Patient can return home with the following  A little help with walking and/or transfers;A little help with bathing/dressing/bathroom;Assistance with cooking/housework;Assist for transportation;Help with stairs or ramp for entrance    Equipment Recommendations Other (comment) (TBD)  Recommendations for Other Services       Functional Status Assessment Patient has had a recent decline in their functional status and demonstrates the ability to make significant improvements in function in a reasonable and predictable amount of time.     Precautions / Restrictions Precautions Precautions: Fall Restrictions Weight Bearing Restrictions: No      Mobility  Bed Mobility Overal bed mobility: Needs Assistance Bed Mobility: Supine to Sit, Sit to Supine  Supine to sit: Min assist Sit to supine: Min guard  General bed mobility comments: min A to upright trunk and scoot out to EOB to avoid LOB posterior, min guard to return to supine requiring increased time    Transfers Overall transfer level: Needs assistance  Transfers: Bed to chair/wheelchair/BSC   Lateral/Scoot Transfers: Min guard General transfer comment: pt completes lateral scoot up to [facility] with min guard for safety, increased time, able to clear bottom from bed    Ambulation/Gait  General Gait Details: pt declines due to being tired  Science writer    Modified Rankin (Stroke Patients Only)       Balance Overall balance assessment: Needs assistance Sitting-balance support: Feet supported Sitting balance-Leahy Scale: Good  Sitting balance - Comments: seated EOB       Pertinent Vitals/Pain Pain Assessment Pain Assessment: No/denies pain    Home Living Family/patient expects to be discharged to:: Private  residence Living Arrangements: Other relatives (sister) Available Help at Discharge: Family Type of Home: House Home Access: Stairs to enter Entrance Stairs-Rails: Psychiatric nurse of Steps: 2   Home Layout: One level Home Equipment: Cane - single point;BSC/3in1      Prior Function Prior Level of Function : Needs assist  Mobility Comments: pt reports using SPC without assist for household distances, denies falls in last 6 months ADLs Comments: pt reports siblings assist with bathing and dressing, siblings complete household chores     Hand Dominance   Dominant Hand: Left    Extremity/Trunk Assessment        Lower Extremity Assessment Lower Extremity Assessment: Generalized weakness;RLE deficits/detail;LLE deficits/detail RLE Deficits / Details: AROM WNL, strength 3+/5 throughout, denies numbness/tingling, symmetrical RLE Sensation: WNL RLE Coordination: WNL LLE Deficits / Details: AROM WNL, strength 3+/5 throughout, denies numbness/tingling, symmetrical LLE Sensation: WNL LLE Coordination: WNL       Communication   Communication: No difficulties  Cognition Arousal/Alertness: Awake/alert Behavior During Therapy: WFL for tasks assessed/performed Overall Cognitive Status: Within Functional Limits for tasks assessed  General Comments: pt reports "I'm tired from going all over the hospital today"        General Comments      Exercises     Assessment/Plan    PT Assessment Patient needs continued PT services  PT Problem List Decreased strength;Decreased activity tolerance;Decreased balance;Decreased mobility;Decreased knowledge of use of DME       PT Treatment Interventions DME instruction;Gait training;Stair training;Functional mobility training;Therapeutic activities;Therapeutic exercise;Balance training;Patient/family education    PT Goals (Current goals can be found in the Care Plan section)  Acute Rehab PT Goals Patient Stated Goal: home  with family assisting PT Goal Formulation: With patient/family Time For Goal Achievement: 04/09/21 Potential to Achieve Goals: Fair    Frequency Min 3X/week     Co-evaluation               AM-PAC PT "6 Clicks" Mobility  Outcome Measure Help needed turning from your back to your side while in a flat bed without using bedrails?: A Little Help needed moving from lying on your back to sitting on the side of a flat bed without using bedrails?: A Little Help needed moving to and from a bed to a chair (including a wheelchair)?: A Little Help needed standing up from a chair using your arms (e.g., wheelchair or bedside chair)?: A Little Help needed to walk in hospital room?: A Lot Help needed climbing 3-5 steps with a railing? : A Lot 6 Click Score: 16    End of Session Equipment Utilized During Treatment: Gait belt Activity Tolerance: Patient limited by fatigue Patient left: in bed;with call bell/phone within reach;with bed alarm set;with family/visitor present Nurse Communication: Mobility status;Other (comment) (nystagmus) PT Visit Diagnosis: Other abnormalities of gait and mobility (R26.89);Muscle weakness (generalized) (M62.81);Difficulty in walking, not elsewhere classified (R26.2)    Time: 5329-9242 PT Time Calculation (min) (ACUTE ONLY): 16 min   Charges:   PT Evaluation $PT Eval Moderate Complexity: 1 Mod           Tori Kevyn Boquet PT, DPT 03/26/21, 4:01 PM

## 2021-03-26 NOTE — Progress Notes (Addendum)
PROGRESS NOTE    Ryan Davis  OXB:[id number] DOB: [birth year] DOA: 03/25/2021 PCP: Ryan Bellow, PA-C  Chief Complaint  Patient presents with   Urinary Retention    Brief Narrative:  61 yo with hx metastatic prostate cancer, LLE DVT on eliquis, atrial fibrillation who presented with severe abdominal pain and difficulty urinating.  Ryan Davis foley catheter was placed with improvement of symptoms.  An MRI was ordered due to new urinary retention in setting of metastatic prostate cancer.  An MRI brain was ordered due to new nystagmus noted on hospital day 1.  He was also noted to be anemic and thrombocytopenic without evidence of bleeding, he was continued cautiously on his prior to admission eliquis.  Will discuss his care with Dr. Marin Davis.  See below for additional details    Assessment & Plan:   Principal Problem:   Intractable abdominal pain Active Problems:   Urinary retention   Nystagmus   Symptomatic anemia   Prostate cancer metastatic to bone (HCC)   Thrombocytopenia (HCC)   Left leg DVT (HCC)   AF (paroxysmal atrial fibrillation) (HCC)   Cancer related pain   Renal artery stenosis (HCC)   Hypokalemia due to excessive gastrointestinal loss of potassium   Anemia   Folate deficiency   Metastatic cancer (HCC)   Assessment and Plan: * Intractable abdominal pain- (present on admission) Resolved, suspect related to urinary retention and metastatic cancer Continue fentanyl patch, gabapentin and dilaudid (PO and IV ordered) CT with foley catheter, not clearly within bladder lumen, body wall wasting, diffuse sclerotic mets Foley was advanced in ED, has good output, no hematuria - can monitor for now, consider renal US to help confirm placement if concerns  Urinary retention MRI lumbar spine given urinary retention with metastatic prostate cancer (no saddle anesthesia, symmetric 4/5 LE strength)  Nystagmus New onset since here in hospital, unclear exactly when this started -  sister left around midnight and didn't notice it.  She noticed this morning when she was here around 9.  Discussed informally with neurology.  Will follow stat MRI brain with/without contrast.   Addendum: MRI brain without cause, discussed with neuro on call who recommended treating for possible vestibular neuritis, steroid taper, prn meclizine  Symptomatic anemia- (present on admission) Hb 10 03/01/2021 At presentation 6.8 Hb No obvious bleeding Labs c/w chronic inflammation and folate deficiency S/p 1 unit pRBCs Trend H/H  Prostate cancer metastatic to bone [facility])- (present on admission) Saw Ryan Dawson, NP with oncology 03/01/2021 and discussed hospice with Dr. Marin Davis Will discuss Ryan Davis's care with Dr. Marin Davis  Left leg DVT [facility])- (present on admission) eliquis  Thrombocytopenia ([place])- (present on admission) Noted, follow closely with eliquis and anemia  AF (paroxysmal atrial fibrillation) ([place])- (present on admission) eliquis metop  Cancer related pain- (present on admission) Fentanyl patch, gabapentin, dilaudid  Renal artery stenosis (HCC) High grade foral stenosis of proximal main L renal artery   DVT prophylaxis: eliquis Code Status: full Family Communication: sister Disposition:   Status is: Observation The patient will require care spanning > 2 midnights and should be moved to inpatient because: anemia, pain control, pending MRI studies   Consultants:  Neurology over phone  Procedures:  none  Antimicrobials:  Anti-infectives (From admission, onward)    None       Subjective: No new complaints Abdominal pain is better  Objective: Vitals:   03/26/21 0941 03/26/21 1227 03/26/21 1445 03/26/21 1700  BP: (!) 150/90 (!) 144/97 (!) 141/86 130/85  Pulse: 80 81 78 (!) 110  Resp: 16 16 16 18   Temp: 98.4 F (36.9 C) 98.2 F (36.8 C)  98.4 F (36.9 C)  TempSrc: Oral Oral Oral   SpO2: 100% 100%  99%  Weight:      Height:        Intake/Output  Summary (Last 24 hours) at 03/26/2021 1917 Last data filed at 03/26/2021 1800 Gross per 24 hour  Intake 1847.29 ml  Output 3675 ml  Net -1827.71 ml   Filed Weights   03/25/21 1736  Weight: 52.6 kg    Examination:  General: No acute distress. Cardiovascular: RRR Lungs: unlabored Abdomen: Soft, nontender, nondistended with normal active bowel sounds. No masses. No hepatosplenomegaly. Neurological: Alert and oriented 3. Moves all extremities 4 - 4/5 strength to bilateral le's. Horizontal nystagmus, constant. Skin: Warm and dry. No rashes or lesions. Extremities: No clubbing or cyanosis. No edema.    Data Reviewed: I have personally reviewed following labs and imaging studies  CBC: Recent Labs  Lab 03/25/21 1819 03/26/21 0320 03/26/21 1227  WBC 10.1 9.6  --   NEUTROABS 4.8  --   --   HGB 6.8* 6.8* 8.3*  HCT 20.9* 20.5* 25.0*  MCV 91.7 91.1  --   PLT 69* 66*  --     Basic Metabolic Panel: Recent Labs  Lab 03/25/21 1748 03/26/21 0320  NA 132* 136  K 3.0* 3.6  CL 97* 102  CO2 22 25  GLUCOSE 123* 134*  BUN 9 8  CREATININE 0.59* 0.49*  CALCIUM 8.4* 8.0*  MG 2.0  --     GFR: Estimated Creatinine Clearance: 74 mL/min (Ryan Davis) (by C-G formula based on SCr of 0.49 mg/dL (L)).  Liver Function Tests: Recent Labs  Lab 03/25/21 1819 03/26/21 0320  AST 21 23  ALT 20 20  ALKPHOS 744* 748*  BILITOT 0.4 0.6  PROT 7.1 6.7  ALBUMIN 3.4* 3.3*    CBG: No results for input(s): GLUCAP in the last 168 hours.   No results found for this or any previous visit (from the past 240 hour(s)).       Radiology Studies: MR BRAIN W WO CONTRAST  Result Date: 03/26/2021 CLINICAL DATA:  Known metastatic prostate cancer, nystagmus EXAM: MRI HEAD WITHOUT AND WITH CONTRAST TECHNIQUE: Multiplanar, multiecho pulse sequences of the brain and surrounding structures were obtained without and with intravenous contrast. CONTRAST:  83mL GADAVIST GADOBUTROL 1 MMOL/ML IV SOLN COMPARISON:  No  direct comparison study available. PET-CT 08/14/2020 FINDINGS: Axial T2 sequence was not performed due to difficulty with patient cooperation. Brain: There is no evidence of acute intracranial hemorrhage, extra-axial fluid collection, or acute infarct. Background parenchymal volume is normal. The ventricles are normal in size. Scattered small foci of FLAIR signal abnormality in the subcortical and periventricular white matter are nonspecific but most likely reflects sequela of mild chronic white matter microangiopathy. There is no suspicious parenchymal signal abnormality. There is no abnormal enhancement. There is no mass lesion, mass effect, or midline shift. Vascular: The major flow voids appear present on the coronal T2 sequence. Skull and upper cervical spine: Marrow signal is diffusely abnormal consistent with widespread osseous metastatic disease. Sinuses/Orbits: The imaged paranasal sinuses appear clear. The globes and orbits are grossly unremarkable. Other: None. IMPRESSION: 1. No acute intracranial pathology or evidence of intracranial metastatic disease. 2. Mild chronic white matter microangiopathy. 3. Diffusely abnormal marrow signal consistent with osseous metastatic disease. Electronically Signed   By: Court Joy.D.  On: 03/26/2021 15:00   MR Lumbar Spine W Wo Contrast  Result Date: 03/26/2021 CLINICAL DATA:  History of prostate cancer with widespread metastatic disease, urinary retention EXAM: MRI LUMBAR SPINE WITHOUT AND WITH CONTRAST TECHNIQUE: Multiplanar and multiecho pulse sequences of the lumbar spine were obtained without and with intravenous contrast. CONTRAST:  48mL GADAVIST GADOBUTROL 1 MMOL/ML IV SOLN COMPARISON:  Lumbar spine MRI 04/14/2017, CT abdomen/pelvis 03/25/2021 FINDINGS: Segmentation: Standard; the lowest formed disc space is designated L5-S1. Alignment:  Normal. Vertebrae: T1 marrow signal is diffusely hypointense with heterogeneous areas of enhancement in the L2, L3, S1,  and S2 vertebral bodies, right L5 pedicle, as well as in the bilateral iliac bones, likely reflecting metastatic disease. There is no epidural extension of tumor. There is no evidence of acute injury Conus medullaris and cauda equina: Conus extends to the L1 level. Conus and cauda equina appear normal. There is no abnormal enhancement of the cauda equina nerve roots. Paraspinal and other soft tissues: The paraspinal soft tissues are unremarkable. The abdominal and pelvic viscera are assessed on the CT abdomen/pelvis dated 1 day prior. Disc levels: The disc heights are overall preserved. There is mild multilevel facet arthropathy throughout the lumbar spine, most advanced at L4-L5 and L5-S1, with an associated left facet joint effusions at L5-S1. T12-L1: No significant spinal canal or neural foraminal stenosis. L1-L2: No significant spinal canal or neural foraminal stenosis L2-L3: There is Harika Laidlaw mild right subarticular/foraminal disc protrusion resulting in mild narrowing of the right subarticular zone without evidence of nerve root impingement, and no significant spinal canal or neural foraminal stenosis L3-L4: Is Maximiano Lott mild right foraminal disc protrusion and mild bilateral facet arthropathy without significant spinal canal or neural foraminal stenosis. L4-L5: There is Mykale Gandolfo right foraminal/extraforaminal disc protrusion which may contact the exiting right L4 nerve root (21-5). There is mild bilateral facet arthropathy without significant spinal canal or neural foraminal stenosis L5-S1: There is Wyllow Seigler mild disc bulge and bilateral facet arthropathy resulting in mild left and no significant right neural foraminal stenosis and no significant spinal canal stenosis. IMPRESSION: 1. Diffusely abnormal marrow signal with patchy areas of enhancement as described above consistent with with osseous metastatic disease. No epidural extension of tumor or cord/cauda equina nerve root compression. 2. Right foraminal/extraforaminal disc  protrusion at L4-L5 which may contact the exiting right L4 nerve root. 3. Facet arthropathy most advanced at L4-L5 and L5-S1 with an associated effusion on the left at L5-S1. 4. No evidence of acute injury. Electronically Signed   By: Valetta Mole M.D.   On: 03/26/2021 14:45   CT Angio Abd/Pel W and/or Wo Contrast  Result Date: 03/25/2021 CLINICAL DATA:  Acute mesenteric ischemia, urinary retention EXAM: CTA ABDOMEN AND PELVIS WITHOUT AND WITH CONTRAST TECHNIQUE: Multidetector CT imaging of the abdomen and pelvis was performed using the standard protocol during bolus administration of intravenous contrast. Multiplanar reconstructed images and MIPs were obtained and reviewed to evaluate the vascular anatomy. RADIATION DOSE REDUCTION: This exam was performed according to the departmental dose-optimization program which includes automated exposure control, adjustment of the mA and/or kV according to patient size and/or use of iterative reconstruction technique. CONTRAST:  111mL OMNIPAQUE IOHEXOL 350 MG/ML SOLN COMPARISON:  CT abdomen pelvis 02/21/2021 FINDINGS: VASCULAR Aorta: Normal caliber aorta without aneurysm, dissection, vasculitis or significant stenosis. Celiac: Patent without evidence of aneurysm, dissection, vasculitis or significant stenosis. SMA: Patent without evidence of aneurysm, dissection, vasculitis or significant stenosis. Renals: Single right and dual left renal arteries are noted. There  is Angelyse Heslin high-grade focal stenosis involving the proximal aspect of the main left renal artery. No significant atherosclerotic plaque is identified in this location. This may reflect changes of adventitial or Peri adventitial fibromuscular dysplasia, neurofibromatosis, or represent the remote sequela of large vessel vasculitis. No aneurysm or dissection. IMA: Patent without evidence of aneurysm, dissection, vasculitis or significant stenosis. Inflow: Patent without evidence of aneurysm, dissection, vasculitis or  significant stenosis. Proximal Outflow: Bilateral common femoral and visualized portions of the superficial and profunda femoral arteries are patent without evidence of aneurysm, dissection, vasculitis or significant stenosis. Veins: The venous vasculature is unremarkable. Specifically, the superior and inferior mesenteric vein is, splenic veins, and portal vein are patent. Review of the MIP images confirms the above findings. NON-VASCULAR Lower chest: No acute abnormality. Hepatobiliary: No focal liver abnormality is seen. No gallstones, gallbladder wall thickening, or biliary dilatation. Pancreas: Stable 3.2 cm cyst within the tail the pancreas within the splenic hilum. The pancreas is otherwise unremarkable. Spleen: Unremarkable Adrenals/Urinary Tract: The adrenal glands are unremarkable. The kidneys are normal. The Foley catheter balloon is not clearly position within the bladder lumen and may be inflated within the prostatic urethra. The tip of the catheter, additionally, is not clearly within the bladder lumen, best seen on sagittal image # 86/14. The bladder is not distended. Stomach/Bowel: The stomach, small bowel, and large bowel are unremarkable. No free intraperitoneal gas or fluid. Lymphatic: No pathologic adenopathy within the abdomen and pelvis. Reproductive: The prostate gland is otherwise unremarkable. Other: There is marked body wall wasting with diffuse infiltration of the subcutaneous and retroperitoneal fat in keeping with anasarca, possibly related to hypoproteinemia Musculoskeletal: Diffuse sclerotic metastases are seen throughout the visualized axial skeleton in keeping with the patient's given history of prostate cancer. IMPRESSION: VASCULAR High-grade focal stenosis of the proximal main left renal artery just beyond the Hazelynn Mckenny vessel origin. See differential considerations above. Correlation for signs and symptoms of clinically significant renal artery stenosis is recommended. No radiologic  evidence of acute or chronic mesenteric ischemia. NON-VASCULAR Foley catheter balloon is not clearly position within the bladder lumen and may be within the prostatic urethra. Clinical correlation is suggested. The bladder does not appear distended, however. Marked body wall wasting, similar to prior examination, with diffuse infiltration of the subcutaneous and retroperitoneal fat possibly related to hypoproteinemia and resultant anasarca. Diffuse osseous sclerotic metastases in keeping with the patient's given history of prostate cancer. Electronically Signed   By: Fidela Salisbury M.D.   On: 03/25/2021 23:14        Scheduled Meds:  apixaban  5 mg Oral BID   Chlorhexidine Gluconate Cloth  6 each Topical Daily   fentaNYL  1 patch Transdermal K93G   folic acid  1 mg Oral Daily   gabapentin  300 mg Oral Daily   gabapentin  600 mg Oral QHS   metoprolol tartrate  12.5 mg Oral BID   pantoprazole  40 mg Oral Daily   polyethylene glycol  17 g Oral BID   [START ON 03/27/2021] predniSONE  40 mg Oral Q breakfast   Followed by   Derrill Memo ON 04/01/2021] predniSONE  30 mg Oral Q breakfast   Followed by   Derrill Memo ON 04/02/2021] predniSONE  20 mg Oral Q breakfast   Followed by   Derrill Memo ON 04/03/2021] predniSONE  10 mg Oral Q breakfast   senna  1 tablet Oral BID   sodium chloride flush  10-40 mL Intracatheter Q12H   tamsulosin  0.4 mg Oral  Daily   Continuous Infusions:  sodium chloride Stopped (03/25/21 2348)     LOS: 0 days    Time spent: over 30 min    Fayrene Helper, MD [facility]   To contact the attending provider between 7A-7P or the covering provider during after hours 7P-7A, please log into the web site [email or web address] and access using universal Tok password for that web site. If you do not have the password, please call the hospital operator.  03/26/2021, 7:17 PM

## 2021-03-26 NOTE — ED Notes (Signed)
Very large amount of cebum removed from left ear canal. Right ear flushed/cleaned per request.

## 2021-03-26 NOTE — H&P (Addendum)
History and Physical    Ryan Davis IHK:[id number] DOB: 1960-02-05 DOA: 03/25/2021  PCP: Robert Bellow, PA-C   Patient coming from: Home   Chief Complaint: Abdominal pain, difficulty urinating   HPI: Ryan Davis is a pleasant 61 y.o. male with medical history significant for atrial fibrillation, left leg DVT on Eliquis, and metastatic prostate cancer with chronic pain, now presenting to the emergency department for evaluation of abdominal pain and difficulty urinating.  The patient reports that he has had severe pain in the lower abdomen that is the same in location and character as some of his chronic pain but has become much more severe over the past couple days without any inciting event that he can identify.  He has also had difficulty voiding since this morning.  He reports increased fatigue and lightheadedness when standing but denies chest pain, denies cough, and denies any fevers or chills.  ED Course: Upon arrival to the ED, patient is found to be afebrile and saturating well on room air with stable blood pressure.  Chemistry panel notable for sodium 132, potassium 3.0, and alkaline phosphatase 749.  CBC features a hemoglobin of 6.8 and platelets 69,000.  Lactic acid was 3.0 and then 2.2.  CT of the abdomen and pelvis notable for high-grade focal stenosis of the proximal left renal artery and question of malpositioned Foley catheter though bladder not distended.  Patient was given a liter of LR, IV potassium, 2 doses of IV Dilaudid, IV fentanyl, and Zofran in the ED.  1 unit of RBC was ordered from the ED and hospitalists consulted for admission.  Review of Systems:  All other systems reviewed and apart from HPI, are negative.  Past Medical History:  Diagnosis Date   A-fib [facility])    see 03-28-16 admission   Back pain without radiculopathy 03/28/2016   Dyspnea    endorses since treatment radiation , still gets SOB occasionally    Dysrhythmia    afib    Goals of care,  counseling/discussion 06/24/2017   History of radiation therapy 09/16/16-10/06/16   35 Gy in 14 fractions to the lumbar spine   Nocturia    Prostate cancer metastatic to bone ([place]) 03/31/2016   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[place]) 03/31/2016    Past Surgical History:  Procedure Laterality Date   IR IMAGING GUIDED PORT INSERTION  09/05/2020   MULTIPLE EXTRACTIONS WITH ALVEOLOPLASTY N/A 11/19/2016   Procedure: EXTRACTION OF TOOTH #'S 1,3,5-7,9-17, AND 20- 29 WITH ALVEOLOPLASTY, BILATERAL MANDIBULAR TORI REDUCTIONS AND BILATERAL MAXILLARY BUCCAL EXOSTOSES REDUCTIONS;  Surgeon: Lenn Cal, DDS;  Location: [facility] ORS;  Service: Oral Surgery;  Laterality: N/A;   NO PAST SURGERIES      Social History:   reports that he quit smoking about 10 months ago. His smoking use included cigarettes. He has a 19.50 pack-year smoking history. He has never used smokeless tobacco. He reports that he does not currently use alcohol. He reports that he does not use drugs.  Allergies  Allergen Reactions   Contrast Media [Iodinated Contrast Media] Nausea And Vomiting    Patient vomits immediately after IV contrast is injected just prior to start of scan, was unable to scan patient in correct time frame.  Patient did not warn me of this when dosing the patient with oral CM and asking the patient history questions until the patient was on the table for the CT scan just prior to injection.  Ct Tech:  Karl Bales 07/29/17  Other Nausea And Vomiting    Patient vomits immediately after IV contrast is injected just prior to start of scan, was unable to scan patient in correct time frame.  Patient did not warn me of this when dosing the patient with oral CM and asking the patient history questions until the patient was on the table for the CT scan just prior to injection.  Ct Tech:  Karl Bales 07/29/17     Family History  Problem Relation Age of Onset   Stroke Sister    Lupus Mother    Cancer Neg Hx     Heart disease Neg Hx    Diabetes Neg Hx      Prior to Admission medications   Medication Sig Start Date End Date Taking? Authorizing Provider  APIXABAN Arne Cleveland) VTE STARTER PACK (10MG  AND 5MG ) Take 2 tablets (10mg ) by mouth twice daily for 7 days, then take 1 tablet (5 mg) twice daily. Start date: 02/15/21  Stop date: TBD. 02/15/21   [provider]  cephALEXin (KEFLEX) 500 MG capsule Take 1 capsule (500 mg total) by mouth 4 (four) times daily. 02/07/21   Valarie Merino, MD  dronabinol (MARINOL) 5 MG capsule Take 1 capsule (5 mg total) by mouth 2 (two) times daily before a meal. 02/14/21   Pickenpack-Cousar, Carlena Sax, NP  ELIQUIS DVT/PE STARTER PACK Take 1 tablet by mouth as directed. 02/15/21   [provider]  famotidine (PEPCID) 40 MG tablet Take 1 tablet (40 mg total) by mouth 2 (two) times daily. 09/27/20   [name], MD  fentaNYL (DURAGESIC) 100 MCG/HR Place 1 patch onto the skin every other day. 03/01/21   Celso Amy, NP  gabapentin (NEURONTIN) 300 MG capsule Take 1 capsule in the morning (300 mg) and take 2 capsules (600 mg) at bedtime. 03/11/21   Pickenpack-Cousar, Carlena Sax, NP  HYDROmorphone (DILAUDID) 2 MG tablet Take 1 tablet (2 mg total) by mouth every 6 (six) hours as needed for severe pain or moderate pain. 03/11/21   Pickenpack-Cousar, Carlena Sax, NP  lactulose (CHRONULAC) 10 GM/15ML solution Take 15 mLs (10 g total) by mouth daily as needed for mild constipation, severe constipation or moderate constipation. 03/19/21   Pickenpack-Cousar, Carlena Sax, NP  LORazepam (ATIVAN) 0.5 MG tablet Take 1 tablet (0.5 mg total) by mouth every 8 (eight) hours as needed for anxiety. 03/18/21   Pickenpack-Cousar, Carlena Sax, NP  metoprolol tartrate (LOPRESSOR) 25 MG tablet TAKE 1/2 TABLET(12.5 MG) BY MOUTH TWICE DAILY Patient taking differently: Take 12.5 mg by mouth 2 (two) times daily. 09/24/20   [name], MD  Naloxone HCl 0.4 MG/0.4ML SOAJ Use in the event of accidental  overdose of opioids. 08/26/20   Arnaldo Natal, MD  pantoprazole (PROTONIX) 40 MG tablet Take 1 tablet (40 mg total) by mouth daily. 03/11/21   Pickenpack-Cousar, Carlena Sax, NP  Plecanatide (TRULANCE) 3 MG TABS Take 3 mg by mouth daily. 10/24/20   [provider]  polyethylene glycol (MIRALAX / GLYCOLAX) 17 g packet Take 17 g by mouth 2 (two) times daily. 10/21/20   Shelly Coss, MD  senna (SENOKOT) 8.6 MG TABS tablet Take 2 tablets (17.2 mg total) by mouth 2 (two) times daily. 10/21/20   Shelly Coss, MD  tamsulosin (FLOMAX) 0.4 MG CAPS capsule Take 1 capsule (0.4 mg total) by mouth daily. 03/19/21   Pickenpack-Cousar, Carlena Sax, NP  prochlorperazine (COMPAZINE) 10 MG tablet Take 1 tablet (10 mg total) by mouth every 6 (  six) hours as needed (Nausea or vomiting). Patient not taking: Reported on 11/12/2020 09/06/20 11/12/20  [name], MD    Physical Exam: Vitals:   03/25/21 2101 03/25/21 2130 03/25/21 2200 03/25/21 2321  BP: (!) 139/105 (!) 150/94 (!) 157/86 (!) 148/93  Pulse: 100 93 (!) 102 80  Resp: 17   16  Temp:      TempSrc:      SpO2: 100% 100% 100% 100%  Weight:      Height:        Constitutional: NAD, calm, cachectic   Eyes: PERTLA, lids and conjunctivae normal ENMT: Mucous membranes are moist. Posterior pharynx clear of any exudate or lesions.   Neck: supple, no masses  Respiratory:  no wheezing, no crackles. No accessory muscle use.  Cardiovascular: S1 & S2 heard, regular rate and rhythm. Left leg edema.   Abdomen: No distension, soft, tender without rebound pain or guarding. Bowel sounds active.  Musculoskeletal: no clubbing / cyanosis. No joint deformity upper and lower extremities.   Skin: no significant rashes, lesions, ulcers. Warm, dry, well-perfused. Neurologic: CN 2-12 grossly intact. Moving all extremities. Alert and oriented.  Psychiatric: Pleasant. Cooperative.    Labs and Imaging on Admission: I have personally reviewed following labs and imaging  studies  CBC: Recent Labs  Lab 03/25/21 1819  WBC 10.1  NEUTROABS 4.8  HGB 6.8*  HCT 20.9*  MCV 91.7  PLT 69*   Basic Metabolic Panel: Recent Labs  Lab 03/25/21 1748  NA 132*  K 3.0*  CL 97*  CO2 22  GLUCOSE 123*  BUN 9  CREATININE 0.59*  CALCIUM 8.4*  MG 2.0   GFR: Estimated Creatinine Clearance: 74 mL/min (A) (by C-G formula based on SCr of 0.59 mg/dL (L)). Liver Function Tests: Recent Labs  Lab 03/25/21 1819  AST 21  ALT 20  ALKPHOS 744*  BILITOT 0.4  PROT 7.1  ALBUMIN 3.4*   Recent Labs  Lab 03/25/21 1819  LIPASE 20   No results for input(s): AMMONIA in the last 168 hours. Coagulation Profile: No results for input(s): INR, PROTIME in the last 168 hours. Cardiac Enzymes: No results for input(s): CKTOTAL, CKMB, CKMBINDEX, TROPONINI in the last 168 hours. BNP (last 3 results) No results for input(s): PROBNP in the last 8760 hours. HbA1C: No results for input(s): HGBA1C in the last 72 hours. CBG: No results for input(s): GLUCAP in the last 168 hours. Lipid Profile: No results for input(s): CHOL, HDL, LDLCALC, TRIG, CHOLHDL, LDLDIRECT in the last 72 hours. Thyroid Function Tests: No results for input(s): TSH, T4TOTAL, FREET4, T3FREE, THYROIDAB in the last 72 hours. Anemia Panel: No results for input(s): VITAMINB12, FOLATE, FERRITIN, TIBC, IRON, RETICCTPCT in the last 72 hours. Urine analysis:    Component Value Date/Time   COLORURINE AMBER (A) 03/25/2021 1740   APPEARANCEUR CLOUDY (A) 03/25/2021 1740   LABSPEC 1.025 03/25/2021 1740   PHURINE 5.0 03/25/2021 1740   GLUCOSEU NEGATIVE 03/25/2021 1740   HGBUR NEGATIVE 03/25/2021 1740   BILIRUBINUR NEGATIVE 03/25/2021 1740   KETONESUR 5 (A) 03/25/2021 1740   PROTEINUR 100 (A) 03/25/2021 1740   NITRITE NEGATIVE 03/25/2021 1740   LEUKOCYTESUR NEGATIVE 03/25/2021 1740   Sepsis Labs: @LABRCNTIP (procalcitonin:4,lacticidven:4) )No results found for this or any previous visit (from the past 240  hour(s)).   Radiological Exams on Admission: CT Angio Abd/Pel W and/or Wo Contrast  Result Date: 03/25/2021 CLINICAL DATA:  Acute mesenteric ischemia, urinary retention EXAM: CTA ABDOMEN AND PELVIS WITHOUT AND WITH CONTRAST TECHNIQUE:  Multidetector CT imaging of the abdomen and pelvis was performed using the standard protocol during bolus administration of intravenous contrast. Multiplanar reconstructed images and MIPs were obtained and reviewed to evaluate the vascular anatomy. RADIATION DOSE REDUCTION: This exam was performed according to the departmental dose-optimization program which includes automated exposure control, adjustment of the mA and/or kV according to patient size and/or use of iterative reconstruction technique. CONTRAST:  156mL OMNIPAQUE IOHEXOL 350 MG/ML SOLN COMPARISON:  CT abdomen pelvis 02/21/2021 FINDINGS: VASCULAR Aorta: Normal caliber aorta without aneurysm, dissection, vasculitis or significant stenosis. Celiac: Patent without evidence of aneurysm, dissection, vasculitis or significant stenosis. SMA: Patent without evidence of aneurysm, dissection, vasculitis or significant stenosis. Renals: Single right and dual left renal arteries are noted. There is a high-grade focal stenosis involving the proximal aspect of the main left renal artery. No significant atherosclerotic plaque is identified in this location. This may reflect changes of adventitial or Peri adventitial fibromuscular dysplasia, neurofibromatosis, or represent the remote sequela of large vessel vasculitis. No aneurysm or dissection. IMA: Patent without evidence of aneurysm, dissection, vasculitis or significant stenosis. Inflow: Patent without evidence of aneurysm, dissection, vasculitis or significant stenosis. Proximal Outflow: Bilateral common femoral and visualized portions of the superficial and profunda femoral arteries are patent without evidence of aneurysm, dissection, vasculitis or significant stenosis. Veins:  The venous vasculature is unremarkable. Specifically, the superior and inferior mesenteric vein is, splenic veins, and portal vein are patent. Review of the MIP images confirms the above findings. NON-VASCULAR Lower chest: No acute abnormality. Hepatobiliary: No focal liver abnormality is seen. No gallstones, gallbladder wall thickening, or biliary dilatation. Pancreas: Stable 3.2 cm cyst within the tail the pancreas within the splenic hilum. The pancreas is otherwise unremarkable. Spleen: Unremarkable Adrenals/Urinary Tract: The adrenal glands are unremarkable. The kidneys are normal. The Foley catheter balloon is not clearly position within the bladder lumen and may be inflated within the prostatic urethra. The tip of the catheter, additionally, is not clearly within the bladder lumen, best seen on sagittal image # 86/14. The bladder is not distended. Stomach/Bowel: The stomach, small bowel, and large bowel are unremarkable. No free intraperitoneal gas or fluid. Lymphatic: No pathologic adenopathy within the abdomen and pelvis. Reproductive: The prostate gland is otherwise unremarkable. Other: There is marked body wall wasting with diffuse infiltration of the subcutaneous and retroperitoneal fat in keeping with anasarca, possibly related to hypoproteinemia Musculoskeletal: Diffuse sclerotic metastases are seen throughout the visualized axial skeleton in keeping with the patient's given history of prostate cancer. 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 See differential considerations above. Correlation for signs and symptoms of clinically significant renal artery stenosis is recommended. No radiologic evidence of acute or chronic mesenteric ischemia. NON-VASCULAR Foley catheter balloon is not clearly position within the bladder lumen and may be within the prostatic urethra. Clinical correlation is suggested. The bladder does not appear  distended, however. Marked body wall wasting, similar to prior examination, with diffuse infiltration of the subcutaneous and retroperitoneal fat possibly related to hypoproteinemia and resultant anasarca. Diffuse osseous sclerotic metastases in keeping with the patient's given history of prostate cancer. Electronically Signed   By: Fidela Salisbury M.D.   On: 03/25/2021 23:14     Assessment/Plan   1. Intractable abdominal pain  - Pt with metastatic prostate cancer and chronic pain presents with severe abdominal pain  - Pain is same location and character as chronic pain but more severe despite home  regimen  - He was given 2 doses IV Dilaudid and IV fentanyl in ED but continues to have severe pain  - Continue fentanyl patch and gabapentin, use IV Dilaudid for now, continue bowel regimen, and convert back to oral Dilaudid once under better control   2. Symptomatic anemia; thrombocytopenia   - Presents with severe abdominal pain and difficulty voiding, also reports increased fatigue and lightheadedness on standing, and is found to have Hgb 6.8 (10.0 on Feb 3rd) and platelets 69k (90k on Feb 3rd)  - Pt denies bleeding, FOBT was negative, and no evidence for bleeding on CT abd/pelvis in ED  - He is being transfused 1 unit RBC  - Add-on anemia panel, cautiously continue Eliquis, trend blood counts    3. Metastatic prostate cancer  - Unfortunate situation with progressive disease  - Oncology has been discussing hospice with him and his sister who is his caregiver and POA    4. Urinary retention - Presents with  severe abdominal pain and difficulty voiding  - Foley was placed in ED, is draining well, and has relieved some of his pain  5. Left leg DVT  - Pt had not been anticoagulated for PAF d/t low CHADS-VASc but was found to have left common femoral vein thrombosis on 02/15/21 and now on Eliquis  - No evidence for bleeding, will cautiously continue Eliquis    6. PAF  - Continue metoprolol    7. Hypokalemia  - Replaced in ED, repeat chem panel in am    8. Hyponatremia  - Serum sodium is 132 in setting of hypovolemia  - Continue IVF hydration and monitor   9. Left renal artery stenosis  - Noted on CT in ED, unclear etiology  - BP stable and renal function normal    DVT prophylaxis: Eliquis  Code Status: Full  Level of Care: Level of care: Med-Surg Family Communication: none present  Disposition Plan:  Patient is from: home  Anticipated d/c is to: TBD Anticipated d/c date is: Possibly as early as 03/26/21 Patient currently: pending pain-control and stable blood counts  Consults called: none  Admission status: Observation    Vianne Bulls, MD [facility]  03/26/2021, 12:49 AM

## 2021-03-26 NOTE — Assessment & Plan Note (Addendum)
Continue home Eliquis. 

## 2021-03-26 NOTE — Assessment & Plan Note (Addendum)
Baseline hemoglobin of about 10. Hemoglobin down to 6.8 on admission. Patient received 1 unit of PRBC. No bleeding noted. Labs consistent with anemia of chronic disease/folate deficiency. Posttransfusion hemoglobin of 8.3 > 9.1. Complicated by metastatic cancer to bone. Continue folic acid

## 2021-03-26 NOTE — Assessment & Plan Note (Addendum)
Recommendation for hospice care per medical oncology. TOC consulted for home with hospice referral.

## 2021-03-26 NOTE — Assessment & Plan Note (Addendum)
Stable. Oncology recommends continued use of Eliquis since patient has no evidence of active bleeding.

## 2021-03-26 NOTE — Assessment & Plan Note (Addendum)
See problem, Intractable abdominal pain

## 2021-03-26 NOTE — Assessment & Plan Note (Addendum)
New onset. MRI brain obtained without clear etiology. Neurology curbside recommendations included empiric treatment for vestibular neuritis with steroid taper and meclizine PRN. Improved. Continue prednisone taper on discharge.

## 2021-03-26 NOTE — Consult Note (Addendum)
[place]  [phone] Fax:([phone]) [place]  Reason for Referral: Metastatic prostate cancer  HPI: Mr. Yepiz is a 61 year old male with a past medical history significant for A-fib, left lower extremity DVT on Eliquis, metastatic prostate cancer with chronic pain.  He presented to the emergency department with abdominal pain and difficulty urinating.  He has been having severe pain in his lower abdomen that is in the same location as his chronic pain but it has become much more severe over the past few days.  He has also had difficulty voiding.  He reports increased fatigue and lightheadedness when standing.  In the emergency department, sodium was 132, potassium 3.0, alk phos 749, hemoglobin 6.8, platelet count 69,000.  CTA abdomen/pelvis showed high-grade focal stenosis of the proximal main left renal artery just beyond the vessel origin, Foley catheter balloon not in position within the bladder lumen and may be within the prostatic urethra, marked body wall wasting with diffuse infiltration of subcutaneous and retroperitoneal fat possibly related to hypoproteinemia and resultant anasarca.  He has diffuse osseous sclerotic metastases in keeping with the patient's history of prostate cancer.  He had an MRI of the lumbar spine which showed diffusely abnormal marrow consistent with osseous metastatic disease, no epidural extension of the tumor or cord/cauda equina nerve root compression.  MRI brain showed no acute intracranial pathology or evidence of intracranial metastatic disease.  The patient was last seen on 03/01/2021 for follow-up of his metastatic prostate cancer.  He was quite weak and fatigued at that visit.  It appears that he is supposed to be taking Pluvicto but never started it.  A discussion was had with the patient and his brother about hospice.  He wanted to think about it.  Since that visit, he was also seen by the palliative care  APP in our office who had ongoing discussion regarding goals of care.  He indicated at that visit that he was not ready to transition to hospice.  I met with the patient in his hospital room.  His sister was at the bedside.  He reports that his pain is currently well controlled.  Foley catheter is draining clear yellow urine.  He is not complaining of any chest pain or shortness of breath at this time.  No nausea, vomiting, diarrhea reported.  He reports some difficulty seeing.  His sister has noted some changes to his eyes which started at some point after she left last evening.  He did have an MRI of the brain performed earlier today which did not show any acute intracranial abnormality or metastatic disease.  Medical oncology was asked to see the patient to make recommendations regarding his metastatic prostate cancer.   Past Medical History:  Diagnosis Date   A-fib [facility])    see 03-28-16 admission   Back pain without radiculopathy 03/28/2016   Dyspnea    endorses since treatment radiation , still gets SOB occasionally    Dysrhythmia    afib    Goals of care, counseling/discussion 06/24/2017   History of radiation therapy 09/16/16-10/06/16   35 Gy in 14 fractions to the lumbar spine   Nocturia    Prostate cancer metastatic to bone ([place]) 03/31/2016   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[place]) 03/31/2016  :   Past Surgical History:  Procedure Laterality Date   IR IMAGING GUIDED PORT INSERTION  09/05/2020   MULTIPLE EXTRACTIONS WITH ALVEOLOPLASTY N/A 11/19/2016   Procedure: EXTRACTION OF TOOTH #'  S 1,3,5-7,9-17, AND 20- 29 WITH ALVEOLOPLASTY, BILATERAL MANDIBULAR TORI REDUCTIONS AND BILATERAL MAXILLARY BUCCAL EXOSTOSES REDUCTIONS;  Surgeon: Lenn Cal, DDS;  Location: [facility]S;  Service: Oral Surgery;  Laterality: N/A;   NO PAST SURGERIES    :   Current Facility-Administered Medications  Medication Dose Route Frequency Provider Last Rate Last Admin   0.9 %  sodium chloride  infusion  10 mL/hr Intravenous Once Opyd, Ilene Qua, MD   Held at 03/25/21 2348   acetaminophen (TYLENOL) tablet 650 mg  650 mg Oral Q6H PRN Vianne Bulls, MD   650 mg at 03/26/21 0327   Or   acetaminophen (TYLENOL) suppository 650 mg  650 mg Rectal Q6H PRN Opyd, Ilene Qua, MD       apixaban (ELIQUIS) tablet 5 mg  5 mg Oral BID Opyd, Ilene Qua, MD   5 mg at 03/26/21 1124   Chlorhexidine Gluconate Cloth 2 % PADS 6 each  6 each Topical Daily Opyd, Ilene Qua, MD   6 each at 03/26/21 1124   fentaNYL (DURAGESIC) 100 MCG/HR 1 patch  1 patch Transdermal Q48H Opyd, Ilene Qua, MD   1 patch at 08/67/61 9509   folic acid (FOLVITE) tablet 1 mg  1 mg Oral Daily Elodia Florence., MD   1 mg at 03/26/21 1124   gabapentin (NEURONTIN) capsule 300 mg  300 mg Oral Daily Opyd, Ilene Qua, MD   300 mg at 03/26/21 1125   gabapentin (NEURONTIN) capsule 600 mg  600 mg Oral QHS Opyd, Ilene Qua, MD   600 mg at 03/26/21 0145   HYDROmorphone (DILAUDID) injection 1 mg  1 mg Intravenous Q3H PRN Opyd, Ilene Qua, MD   1 mg at 03/26/21 0144   HYDROmorphone (DILAUDID) tablet 2 mg  2 mg Oral Q6H PRN Elodia Florence., MD       lactulose (CHRONULAC) 10 GM/15ML solution 20 g  20 g Oral BID PRN Opyd, Ilene Qua, MD       LORazepam (ATIVAN) tablet 0.5 mg  0.5 mg Oral Q8H PRN Opyd, Ilene Qua, MD   0.5 mg at 03/26/21 0327   metoprolol tartrate (LOPRESSOR) tablet 12.5 mg  12.5 mg Oral BID Opyd, Ilene Qua, MD   12.5 mg at 03/26/21 1123   pantoprazole (PROTONIX) EC tablet 40 mg  40 mg Oral Daily Opyd, Ilene Qua, MD   40 mg at 03/26/21 1125   polyethylene glycol (MIRALAX / GLYCOLAX) packet 17 g  17 g Oral BID Opyd, Ilene Qua, MD   17 g at 03/26/21 1125   senna (SENOKOT) tablet 8.6 mg  1 tablet Oral BID Opyd, Ilene Qua, MD   8.6 mg at 03/26/21 1124   sodium chloride flush (NS) 0.9 % injection 10-40 mL  10-40 mL Intracatheter Q12H Opyd, Ilene Qua, MD       tamsulosin (FLOMAX) capsule 0.4 mg  0.4 mg Oral Daily Opyd, Ilene Qua, MD    0.4 mg at 03/26/21 1124   Facility-Administered Medications Ordered in Other Encounters  Medication Dose Route Frequency Provider Last Rate Last Admin   SONAFINE emulsion 1 application  1 application Topical BID Gery Pray, MD          Allergies  Allergen Reactions   Contrast Media [Iodinated Contrast Media] Nausea And Vomiting    Patient vomits immediately after IV contrast is injected just prior to start of scan, was unable to scan patient in correct time frame.  Patient did not warn me of  this when dosing the patient with oral CM and asking the patient history questions until the patient was on the table for the CT scan just prior to injection.  Ct Tech:  Karl Bales 07/29/17    Other Nausea And Vomiting    Patient vomits immediately after IV contrast is injected just prior to start of scan, was unable to scan patient in correct time frame.  Patient did not warn me of this when dosing the patient with oral CM and asking the patient history questions until the patient was on the table for the CT scan just prior to injection.  Ct Tech:  Eliezer Lofts McLawhon 07/29/17   :   Family History  Problem Relation Age of Onset   Stroke Sister    Lupus Mother    Cancer Neg Hx    Heart disease Neg Hx    Diabetes Neg Hx   :   Social History   Socioeconomic History   Marital status: Married    Spouse name: Not on file   Number of children: Not on file   Years of education: Not on file   Highest education level: Not on file  Occupational History   Not on file  Tobacco Use   Smoking status: Former    Packs/day: 0.50    Years: 39.00    Pack years: 19.50    Types: Cigarettes    Quit date: 04/30/2020    Years since quitting: 0.9   Smokeless tobacco: Never  Vaping Use   Vaping Use: Never used  Substance and Sexual Activity   Alcohol use: Not Currently   Drug use: No   Sexual activity: Not on file  Other Topics Concern   Not on file  Social History Narrative   Not on file   Social  Determinants of Health   Financial Resource Strain: Not on file  Food Insecurity: Not on file  Transportation Needs: Not on file  Physical Activity: Not on file  Stress: Not on file  Social Connections: Not on file  Intimate Partner Violence: Not on file  :  Review of Systems: A comprehensive 14 point review of systems was negative except as noted in the HPI.  Exam: Patient Vitals for the past 24 hrs:  BP Temp Temp src Pulse Resp SpO2 Height Weight  03/26/21 1445 (!) 141/86 -- Oral 78 16 -- -- --  03/26/21 1227 (!) 144/97 98.2 F (36.8 C) Oral 81 16 100 % -- --  03/26/21 0941 (!) 150/90 98.4 F (36.9 C) Oral 80 16 100 % -- --  03/26/21 0737 (!) 154/93 98.2 F (36.8 C) Oral 95 15 100 % -- --  03/26/21 0446 (!) 157/97 98.6 F (37 C) -- 88 18 100 % -- --  03/26/21 0427 (!) 145/92 98.7 F (37.1 C) Oral 79 18 100 % -- --  03/26/21 0124 (!) 142/85 98.2 F (36.8 C) Oral 74 16 100 % -- --  03/25/21 2321 (!) 148/93 -- -- 80 16 100 % -- --  03/25/21 2200 (!) 157/86 -- -- (!) 102 -- 100 % -- --  03/25/21 2130 (!) 150/94 -- -- 93 -- 100 % -- --  03/25/21 2101 (!) 139/105 -- -- 100 17 100 % -- --  03/25/21 2100 (!) 139/105 -- -- 100 -- 100 % -- --  03/25/21 2030 125/75 -- -- 89 -- 100 % -- --  03/25/21 1930 (!) 155/95 -- -- (!) 130 20 95 % -- --  03/25/21 1846 Marland Kitchen)  155/97 -- -- (!) 119 18 100 % -- --  03/25/21 1739 (!) 155/97 98 F (36.7 C) Oral (!) 114 16 100 % -- --  03/25/21 1736 -- -- -- -- -- -- 6' 2"  (1.88 m) 52.6 kg    General: Awake and alert, cachectic Eyes: Nystagmus noted.  No scleral icterus.   ENT:  There were no oropharyngeal lesions.   Lymphatics:  Negative cervical, supraclavicular or axillary adenopathy.   Respiratory: lungs were clear bilaterally without wheezing or crackles.   Cardiovascular:  Regular rate and rhythm, S1/S2, without murmur, rub or gallop.  There was no pedal edema.   GI:  abdomen was soft, flat, nontender, nondistended, without organomegaly.    Skin exam was without echymosis, petichae.   Neuro exam was nonfocal. Patient was alert and oriented.      Lab Results  Component Value Date   WBC 9.6 03/26/2021   HGB 8.3 (L) 03/26/2021   HCT 25.0 (L) 03/26/2021   PLT 66 (L) 03/26/2021   GLUCOSE 134 (H) 03/26/2021   ALT 20 03/26/2021   AST 23 03/26/2021   NA 136 03/26/2021   K 3.6 03/26/2021   CL 102 03/26/2021   CREATININE 0.49 (L) 03/26/2021   BUN 8 03/26/2021   CO2 25 03/26/2021    MR BRAIN W WO CONTRAST  Result Date: 03/26/2021 CLINICAL DATA:  Known metastatic prostate cancer, nystagmus EXAM: MRI HEAD WITHOUT AND WITH CONTRAST TECHNIQUE: Multiplanar, multiecho pulse sequences of the brain and surrounding structures were obtained without and with intravenous contrast. CONTRAST:  67m GADAVIST GADOBUTROL 1 MMOL/ML IV SOLN COMPARISON:  No direct comparison study available. PET-CT 08/14/2020 FINDINGS: Axial T2 sequence was not performed due to difficulty with patient cooperation. Brain: There is no evidence of acute intracranial hemorrhage, extra-axial fluid collection, or acute infarct. Background parenchymal volume is normal. The ventricles are normal in size. Scattered small foci of FLAIR signal abnormality in the subcortical and periventricular white matter are nonspecific but most likely reflects sequela of mild chronic white matter microangiopathy. There is no suspicious parenchymal signal abnormality. There is no abnormal enhancement. There is no mass lesion, mass effect, or midline shift. Vascular: The major flow voids appear present on the coronal T2 sequence. Skull and upper cervical spine: Marrow signal is diffusely abnormal consistent with widespread osseous metastatic disease. Sinuses/Orbits: The imaged paranasal sinuses appear clear. The globes and orbits are grossly unremarkable. Other: None. IMPRESSION: 1. No acute intracranial pathology or evidence of intracranial metastatic disease. 2. Mild chronic white matter  microangiopathy. 3. Diffusely abnormal marrow signal consistent with osseous metastatic disease. Electronically Signed   By: PValetta MoleM.D.   On: 03/26/2021 15:00   MR Lumbar Spine W Wo Contrast  Result Date: 03/26/2021 CLINICAL DATA:  History of prostate cancer with widespread metastatic disease, urinary retention EXAM: MRI LUMBAR SPINE WITHOUT AND WITH CONTRAST TECHNIQUE: Multiplanar and multiecho pulse sequences of the lumbar spine were obtained without and with intravenous contrast. CONTRAST:  584mGADAVIST GADOBUTROL 1 MMOL/ML IV SOLN COMPARISON:  Lumbar spine MRI 04/14/2017, CT abdomen/pelvis 03/25/2021 FINDINGS: Segmentation: Standard; the lowest formed disc space is designated L5-S1. Alignment:  Normal. Vertebrae: T1 marrow signal is diffusely hypointense with heterogeneous areas of enhancement in the L2, L3, S1, and S2 vertebral bodies, right L5 pedicle, as well as in the bilateral iliac bones, likely reflecting metastatic disease. There is no epidural extension of tumor. There is no evidence of acute injury Conus medullaris and cauda equina: Conus extends to the L1  level. Conus and cauda equina appear normal. There is no abnormal enhancement of the cauda equina nerve roots. Paraspinal and other soft tissues: The paraspinal soft tissues are unremarkable. The abdominal and pelvic viscera are assessed on the CT abdomen/pelvis dated 1 day prior. Disc levels: The disc heights are overall preserved. There is mild multilevel facet arthropathy throughout the lumbar spine, most advanced at L4-L5 and L5-S1, with an associated left facet joint effusions at L5-S1. T12-L1: No significant spinal canal or neural foraminal stenosis. L1-L2: No significant spinal canal or neural foraminal stenosis L2-L3: There is a mild right subarticular/foraminal disc protrusion resulting in mild narrowing of the right subarticular zone without evidence of nerve root impingement, and no significant spinal canal or neural foraminal  stenosis L3-L4: Is a mild right foraminal disc protrusion and mild bilateral facet arthropathy without significant spinal canal or neural foraminal stenosis. L4-L5: There is a right foraminal/extraforaminal disc protrusion which may contact the exiting right L4 nerve root (21-5). There is mild bilateral facet arthropathy without significant spinal canal or neural foraminal stenosis L5-S1: There is a mild disc bulge and bilateral facet arthropathy resulting in mild left and no significant right neural foraminal stenosis and no significant spinal canal stenosis. IMPRESSION: 1. Diffusely abnormal marrow signal with patchy areas of enhancement as described above consistent with with osseous metastatic disease. No epidural extension of tumor or cord/cauda equina nerve root compression. 2. Right foraminal/extraforaminal disc protrusion at L4-L5 which may contact the exiting right L4 nerve root. 3. Facet arthropathy most advanced at L4-L5 and L5-S1 with an associated effusion on the left at L5-S1. 4. No evidence of acute injury. Electronically Signed   By: Valetta Mole M.D.   On: 03/26/2021 14:45   CT Angio Abd/Pel W and/or Wo Contrast  Result Date: 03/25/2021 CLINICAL DATA:  Acute mesenteric ischemia, urinary retention EXAM: CTA ABDOMEN AND PELVIS WITHOUT AND WITH CONTRAST TECHNIQUE: Multidetector CT imaging of the abdomen and pelvis was performed using the standard protocol during bolus administration of intravenous contrast. Multiplanar reconstructed images and MIPs were obtained and reviewed to evaluate the vascular anatomy. RADIATION DOSE REDUCTION: This exam was performed according to the departmental dose-optimization program which includes automated exposure control, adjustment of the mA and/or kV according to patient size and/or use of iterative reconstruction technique. CONTRAST:  137m OMNIPAQUE IOHEXOL 350 MG/ML SOLN COMPARISON:  CT abdomen pelvis 02/21/2021 FINDINGS: VASCULAR Aorta: Normal caliber aorta  without aneurysm, dissection, vasculitis or significant stenosis. Celiac: Patent without evidence of aneurysm, dissection, vasculitis or significant stenosis. SMA: Patent without evidence of aneurysm, dissection, vasculitis or significant stenosis. Renals: Single right and dual left renal arteries are noted. There is a high-grade focal stenosis involving the proximal aspect of the main left renal artery. No significant atherosclerotic plaque is identified in this location. This may reflect changes of adventitial or Peri adventitial fibromuscular dysplasia, neurofibromatosis, or represent the remote sequela of large vessel vasculitis. No aneurysm or dissection. IMA: Patent without evidence of aneurysm, dissection, vasculitis or significant stenosis. Inflow: Patent without evidence of aneurysm, dissection, vasculitis or significant stenosis. Proximal Outflow: Bilateral common femoral and visualized portions of the superficial and profunda femoral arteries are patent without evidence of aneurysm, dissection, vasculitis or significant stenosis. Veins: The venous vasculature is unremarkable. Specifically, the superior and inferior mesenteric vein is, splenic veins, and portal vein are patent. Review of the MIP images confirms the above findings. NON-VASCULAR Lower chest: No acute abnormality. Hepatobiliary: No focal liver abnormality is seen. No gallstones, gallbladder wall thickening,  or biliary dilatation. Pancreas: Stable 3.2 cm cyst within the tail the pancreas within the splenic hilum. The pancreas is otherwise unremarkable. Spleen: Unremarkable Adrenals/Urinary Tract: The adrenal glands are unremarkable. The kidneys are normal. The Foley catheter balloon is not clearly position within the bladder lumen and may be inflated within the prostatic urethra. The tip of the catheter, additionally, is not clearly within the bladder lumen, best seen on sagittal image # 86/14. The bladder is not distended. Stomach/Bowel: The  stomach, small bowel, and large bowel are unremarkable. No free intraperitoneal gas or fluid. Lymphatic: No pathologic adenopathy within the abdomen and pelvis. Reproductive: The prostate gland is otherwise unremarkable. Other: There is marked body wall wasting with diffuse infiltration of the subcutaneous and retroperitoneal fat in keeping with anasarca, possibly related to hypoproteinemia Musculoskeletal: Diffuse sclerotic metastases are seen throughout the visualized axial skeleton in keeping with the patient's given history of prostate cancer. 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 See differential considerations above. Correlation for signs and symptoms of clinically significant renal artery stenosis is recommended. No radiologic evidence of acute or chronic mesenteric ischemia. NON-VASCULAR Foley catheter balloon is not clearly position within the bladder lumen and may be within the prostatic urethra. Clinical correlation is suggested. The bladder does not appear distended, however. Marked body wall wasting, similar to prior examination, with diffuse infiltration of the subcutaneous and retroperitoneal fat possibly related to hypoproteinemia and resultant anasarca. Diffuse osseous sclerotic metastases in keeping with the patient's given history of prostate cancer. Electronically Signed   By: Fidela Salisbury M.D.   On: 03/25/2021 23:14     MR BRAIN W WO CONTRAST  Result Date: 03/26/2021 CLINICAL DATA:  Known metastatic prostate cancer, nystagmus EXAM: MRI HEAD WITHOUT AND WITH CONTRAST TECHNIQUE: Multiplanar, multiecho pulse sequences of the brain and surrounding structures were obtained without and with intravenous contrast. CONTRAST:  38m GADAVIST GADOBUTROL 1 MMOL/ML IV SOLN COMPARISON:  No direct comparison study available. PET-CT 08/14/2020 FINDINGS: Axial T2 sequence was not performed due to difficulty with patient  cooperation. Brain: There is no evidence of acute intracranial hemorrhage, extra-axial fluid collection, or acute infarct. Background parenchymal volume is normal. The ventricles are normal in size. Scattered small foci of FLAIR signal abnormality in the subcortical and periventricular white matter are nonspecific but most likely reflects sequela of mild chronic white matter microangiopathy. There is no suspicious parenchymal signal abnormality. There is no abnormal enhancement. There is no mass lesion, mass effect, or midline shift. Vascular: The major flow voids appear present on the coronal T2 sequence. Skull and upper cervical spine: Marrow signal is diffusely abnormal consistent with widespread osseous metastatic disease. Sinuses/Orbits: The imaged paranasal sinuses appear clear. The globes and orbits are grossly unremarkable. Other: None. IMPRESSION: 1. No acute intracranial pathology or evidence of intracranial metastatic disease. 2. Mild chronic white matter microangiopathy. 3. Diffusely abnormal marrow signal consistent with osseous metastatic disease. Electronically Signed   By: PValetta MoleM.D.   On: 03/26/2021 15:00   MR Lumbar Spine W Wo Contrast  Result Date: 03/26/2021 CLINICAL DATA:  History of prostate cancer with widespread metastatic disease, urinary retention EXAM: MRI LUMBAR SPINE WITHOUT AND WITH CONTRAST TECHNIQUE: Multiplanar and multiecho pulse sequences of the lumbar spine were obtained without and with intravenous contrast. CONTRAST:  563mGADAVIST GADOBUTROL 1 MMOL/ML IV SOLN COMPARISON:  Lumbar spine MRI 04/14/2017, CT abdomen/pelvis 03/25/2021 FINDINGS: Segmentation: Standard; the lowest formed disc space is  designated L5-S1. Alignment:  Normal. Vertebrae: T1 marrow signal is diffusely hypointense with heterogeneous areas of enhancement in the L2, L3, S1, and S2 vertebral bodies, right L5 pedicle, as well as in the bilateral iliac bones, likely reflecting metastatic disease. There  is no epidural extension of tumor. There is no evidence of acute injury Conus medullaris and cauda equina: Conus extends to the L1 level. Conus and cauda equina appear normal. There is no abnormal enhancement of the cauda equina nerve roots. Paraspinal and other soft tissues: The paraspinal soft tissues are unremarkable. The abdominal and pelvic viscera are assessed on the CT abdomen/pelvis dated 1 day prior. Disc levels: The disc heights are overall preserved. There is mild multilevel facet arthropathy throughout the lumbar spine, most advanced at L4-L5 and L5-S1, with an associated left facet joint effusions at L5-S1. T12-L1: No significant spinal canal or neural foraminal stenosis. L1-L2: No significant spinal canal or neural foraminal stenosis L2-L3: There is a mild right subarticular/foraminal disc protrusion resulting in mild narrowing of the right subarticular zone without evidence of nerve root impingement, and no significant spinal canal or neural foraminal stenosis L3-L4: Is a mild right foraminal disc protrusion and mild bilateral facet arthropathy without significant spinal canal or neural foraminal stenosis. L4-L5: There is a right foraminal/extraforaminal disc protrusion which may contact the exiting right L4 nerve root (21-5). There is mild bilateral facet arthropathy without significant spinal canal or neural foraminal stenosis L5-S1: There is a mild disc bulge and bilateral facet arthropathy resulting in mild left and no significant right neural foraminal stenosis and no significant spinal canal stenosis. IMPRESSION: 1. Diffusely abnormal marrow signal with patchy areas of enhancement as described above consistent with with osseous metastatic disease. No epidural extension of tumor or cord/cauda equina nerve root compression. 2. Right foraminal/extraforaminal disc protrusion at L4-L5 which may contact the exiting right L4 nerve root. 3. Facet arthropathy most advanced at L4-L5 and L5-S1 with an  associated effusion on the left at L5-S1. 4. No evidence of acute injury. Electronically Signed   By: Valetta Mole M.D.   On: 03/26/2021 14:45   CT Angio Abd/Pel W and/or Wo Contrast  Result Date: 03/25/2021 CLINICAL DATA:  Acute mesenteric ischemia, urinary retention EXAM: CTA ABDOMEN AND PELVIS WITHOUT AND WITH CONTRAST TECHNIQUE: Multidetector CT imaging of the abdomen and pelvis was performed using the standard protocol during bolus administration of intravenous contrast. Multiplanar reconstructed images and MIPs were obtained and reviewed to evaluate the vascular anatomy. RADIATION DOSE REDUCTION: This exam was performed according to the departmental dose-optimization program which includes automated exposure control, adjustment of the mA and/or kV according to patient size and/or use of iterative reconstruction technique. CONTRAST:  152m OMNIPAQUE IOHEXOL 350 MG/ML SOLN COMPARISON:  CT abdomen pelvis 02/21/2021 FINDINGS: VASCULAR Aorta: Normal caliber aorta without aneurysm, dissection, vasculitis or significant stenosis. Celiac: Patent without evidence of aneurysm, dissection, vasculitis or significant stenosis. SMA: Patent without evidence of aneurysm, dissection, vasculitis or significant stenosis. Renals: Single right and dual left renal arteries are noted. There is a high-grade focal stenosis involving the proximal aspect of the main left renal artery. No significant atherosclerotic plaque is identified in this location. This may reflect changes of adventitial or Peri adventitial fibromuscular dysplasia, neurofibromatosis, or represent the remote sequela of large vessel vasculitis. No aneurysm or dissection. IMA: Patent without evidence of aneurysm, dissection, vasculitis or significant stenosis. Inflow: Patent without evidence of aneurysm, dissection, vasculitis or significant stenosis. Proximal Outflow: Bilateral common femoral and visualized portions of  the superficial and profunda femoral  arteries are patent without evidence of aneurysm, dissection, vasculitis or significant stenosis. Veins: The venous vasculature is unremarkable. Specifically, the superior and inferior mesenteric vein is, splenic veins, and portal vein are patent. Review of the MIP images confirms the above findings. NON-VASCULAR Lower chest: No acute abnormality. Hepatobiliary: No focal liver abnormality is seen. No gallstones, gallbladder wall thickening, or biliary dilatation. Pancreas: Stable 3.2 cm cyst within the tail the pancreas within the splenic hilum. The pancreas is otherwise unremarkable. Spleen: Unremarkable Adrenals/Urinary Tract: The adrenal glands are unremarkable. The kidneys are normal. The Foley catheter balloon is not clearly position within the bladder lumen and may be inflated within the prostatic urethra. The tip of the catheter, additionally, is not clearly within the bladder lumen, best seen on sagittal image # 86/14. The bladder is not distended. Stomach/Bowel: The stomach, small bowel, and large bowel are unremarkable. No free intraperitoneal gas or fluid. Lymphatic: No pathologic adenopathy within the abdomen and pelvis. Reproductive: The prostate gland is otherwise unremarkable. Other: There is marked body wall wasting with diffuse infiltration of the subcutaneous and retroperitoneal fat in keeping with anasarca, possibly related to hypoproteinemia Musculoskeletal: Diffuse sclerotic metastases are seen throughout the visualized axial skeleton in keeping with the patient's given history of prostate cancer. 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 See differential considerations above. Correlation for signs and symptoms of clinically significant renal artery stenosis is recommended. No radiologic evidence of acute or chronic mesenteric ischemia. NON-VASCULAR Foley catheter balloon is not clearly position within the bladder lumen  and may be within the prostatic urethra. Clinical correlation is suggested. The bladder does not appear distended, however. Marked body wall wasting, similar to prior examination, with diffuse infiltration of the subcutaneous and retroperitoneal fat possibly related to hypoproteinemia and resultant anasarca. Diffuse osseous sclerotic metastases in keeping with the patient's given history of prostate cancer. Electronically Signed   By: Fidela Salisbury M.D.   On: 03/25/2021 23:14    Assessment and Plan:  1.  Metastatic prostate cancer 2.  Abdominal pain, currently controlled 3.  Urinary retention 4.  Nystagmus 5.  Symptomatic anemia  -Discussed the patient's diagnosis of metastatic prostate cancer with him and his sister.  As discussed at his prior visit, treatment options are limited given his poor performance status.  I again discussed the concept of hospice with the patient and his sister.  The patient's sister wanted to speak privately and not in front of the patient.  She is interested in hospice services, but does not want the patient to be fully aware that he is on hospice.  I advised her that I was not certain that the hospice company would be able to do this.  Per her request, I have placed a TOC referral to initiate further hospice discussion.  Please reach out to the patient's sister to discuss the services -I had a discussion with the patient's sister about CODE STATUS.  She got her brother on the phone and both of them are the power of attorney.  They do not want him to suffer and they do agreed to DO NOT RESUSCITATE.  However, they did not want [place] to put a purple armband on the patient indicating that he was a DNR.  Therefore, the patient's sister want to discuss this further with the patient before allowing [place] to write this order. -His abdominal pain is currently well controlled.  Continue  current medications. -Foley catheter in place for urinary retention.  Sister is asking if this can come  out.  I recommend that he keep this in when he discharges to home. -He has nystagmus of unclear etiology.  MRI of the brain did not show any evidence of metastatic disease or any other acute abnormality.  Unsure if this is related to a medication that he is taking.  It was not present by her to admission per family report.  Could consider neurology consult if consistent with goals of care. -Transfuse PRBCs for hemoglobin less than 8.  Mikey Bussing, DNP, AGPCNP-BC, AOCNP   ADDENDUM: I saw Mr. Tenbrink this morning.  I had a long talk with him.  He clearly is having a very difficult time excepting that his life is incredibly limited.  I told him that I do not think he would make it through the month of March.  His decline has been continual and relentless.  I just hate the fact that he has to keep coming to the hospital.  I talked to him about Hospice.  Again he is having a very hard time with this.  I agree with his sister that Hospice would certainly be a good idea.  I told him that he would be able to stay at home.  They can help out in any way.  It did not cost any money.  They can help with his pain issues.  I did talk to him about his desire to be kept alive on machines.  Again, he cannot make this decision.  However, his family does not want him to be kept alive artificially.  However, they do not want to make him a DO NOT RESUSCITATE and he know about this.  I think they want to discuss this with him.  I looked at a blood smear.  He clearly has marrow involvement by the prostate cancer.  He will have problems with anemia.  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  I would keep him on Eliquis for right now as I think that his hypercoagulable tendencies outweigh the bleeding tendencies of anticoagulation.  Again, I would be shocked if Mr. Waltman made it through March.  He just looks very weak.  I am not sure why had the urinary retention.  He  says he is urinating okay right now.  He had the MRI of the back and brain done.  There is no cord compression.  There is no CNS disease.  Our goal clearly is his comfort care.  Hopefully, we will be able to accomplish this for him.  I do appreciate the outstanding care that he is getting from the staff on [facility] [address].  I know that they we will do a wonderful job with him.  Lattie Haw, MD  2 Timothy 4:6-8

## 2021-03-26 NOTE — Assessment & Plan Note (Addendum)
Continue metoprolol and Eliquis

## 2021-03-27 DIAGNOSIS — G893 Neoplasm related pain (acute) (chronic): Principal | ICD-10-CM

## 2021-03-27 DIAGNOSIS — I701 Atherosclerosis of renal artery: Secondary | ICD-10-CM

## 2021-03-27 DIAGNOSIS — E538 Deficiency of other specified B group vitamins: Secondary | ICD-10-CM

## 2021-03-27 DIAGNOSIS — R109 Unspecified abdominal pain: Secondary | ICD-10-CM

## 2021-03-27 LAB — COMPREHENSIVE METABOLIC PANEL
ALT: 16 U/L (ref 0–44)
AST: 23 U/L (ref 15–41)
Albumin: 2.9 g/dL — ABNORMAL LOW (ref 3.5–5.0)
Alkaline Phosphatase: 726 U/L — ABNORMAL HIGH (ref 38–126)
Anion gap: 8 (ref 5–15)
BUN: 10 mg/dL (ref 6–20)
CO2: 27 mmol/L (ref 22–32)
Calcium: 7.4 mg/dL — ABNORMAL LOW (ref 8.9–10.3)
Chloride: 102 mmol/L (ref 98–111)
Creatinine, Ser: 0.56 mg/dL — ABNORMAL LOW (ref 0.61–1.24)
GFR, Estimated: >60 mL/min (ref >60)
Glucose, Bld: 91 mg/dL (ref 70–99)
Potassium: 3.4 mmol/L — ABNORMAL LOW (ref 3.5–5.1)
Sodium: 137 mmol/L (ref 135–145)
Total Bilirubin: 0.6 mg/dL (ref 0.3–1.2)
Total Protein: 6.2 g/dL — ABNORMAL LOW (ref 6.5–8.1)

## 2021-03-27 LAB — PHOSPHORUS: Phosphorus: 1.7 mg/dL — ABNORMAL LOW (ref 2.5–4.6)

## 2021-03-27 LAB — BPAM RBC
Blood Product Expiration Date: 202303182359
ISSUE DATE / TIME: 202302280423
Unit Type and Rh: 6200

## 2021-03-27 LAB — CBC
HCT: 28 % — ABNORMAL LOW (ref 39.0–52.0)
Hemoglobin: 9.1 g/dL — ABNORMAL LOW (ref 13.0–17.0)
MCH: 29.4 pg (ref 26.0–34.0)
MCHC: 32.5 g/dL (ref 30.0–36.0)
MCV: 90.3 fL (ref 80.0–100.0)
Platelets: 76 10*3/uL — ABNORMAL LOW (ref 150–400)
RBC: 3.1 MIL/uL — ABNORMAL LOW (ref 4.22–5.81)
RDW: 20.5 % — ABNORMAL HIGH (ref 11.5–15.5)
WBC: 8.3 10*3/uL (ref 4.0–10.5)
nRBC: 8.2 % — ABNORMAL HIGH (ref 0.0–0.2)

## 2021-03-27 LAB — MAGNESIUM: Magnesium: 2.1 mg/dL (ref 1.7–2.4)

## 2021-03-27 LAB — TYPE AND SCREEN
ABO/RH(D): A POS
Antibody Screen: NEGATIVE
Unit division: 0

## 2021-03-27 MED ORDER — POTASSIUM CHLORIDE CRYS ER 20 MEQ PO TBCR
40.0000 meq | EXTENDED_RELEASE_TABLET | ORAL | Status: AC
  Administered 2021-03-27: 40 meq via ORAL
  Filled 2021-03-27: qty 2

## 2021-03-27 NOTE — Progress Notes (Signed)
PROGRESS NOTE    Ryan Davis  HGD:[id number] DOB: 1960-12-22 DOA: 03/25/2021 PCP: Robert Bellow, PA-C   Brief Narrative: 61 yo with hx metastatic prostate cancer, LLE DVT on eliquis, atrial fibrillation who presented with severe abdominal pain and difficulty urinating.  A foley catheter was placed with improvement of symptoms.  An MRI lumbar spine was ordered due to new urinary retention in setting of metastatic prostate cancer.  An MRI brain was ordered due to new nystagmus noted on hospital day 1.  He was also noted to be anemic and thrombocytopenic without evidence of bleeding, he was continued cautiously on his prior to admission eliquis.  Will discuss his care with Dr. Marin Olp. MRIs without etiology for retention or nystagmus. Plan for discharge home with hospice.   Assessment and Plan: * Intractable abdominal pain- (present on admission) Resolved. Suspected to be related to urinary retention and underlying metastatic cancer. Patient is managed on chronic analgesic regimen. -Continue fentanyl patch, gabapentin and dilaudid  Renal artery stenosis (HCC) High grade foral stenosis of proximal main L renal artery  Folate deficiency -Continue folic acid  Nystagmus New onset. MRI brain obtained without clear etiology. Neurology curbside recommendations included empiric treatment for vestibular neuritis with steroid taper and meclizine PRN. -Continue prednisone taper -Continue meclizine  Urinary retention Recurrent issue. Foley catheter placed this admission. MRI without neurologic etiology for retention. Prior history of obstructive disease. Patient requesting removal of foley catheter prior to discharge -Remove foley catheter for voiding trial -Bladder scan q4 hours; strict in/out  Left leg DVT ([place])- (present on admission) -Continue home Eliquis  Thrombocytopenia ([place])- (present on admission) Stable. Oncology recommends continued use of Eliquis since patient has no evidence  of active bleeding.  Symptomatic anemia- (present on admission) Baseline hemoglobin of about 10. Hemoglobin down to 6.8 on admission. Patient received 1 unit of PRBC. No bleeding noted. Labs consistent with anemia of chronic disease/folate deficiency. Posttransfusion hemoglobin of 8.3 > 9.1.  -Continue folic acid  Cancer related pain- (present on admission) See problem, Intractable abdominal pain  Prostate cancer metastatic to bone [facility])- (present on admission) Recommendation for hospice care per medical oncology. TOC consulted for home with hospice referral.  AF (paroxysmal atrial fibrillation) ([place])- (present on admission) -Continue metoprolol and Eliquis    DVT prophylaxis: Eliquis Code Status:   Code Status: Full Code Family Communication: None at bedside Disposition Plan: Discharge home with hospice tomorrow pending successful voiding trial vs reinsertion of foley catheter   Consultants:  Medical oncology  Procedures:  None  Antimicrobials: None    Subjective: Patient reports no issues this morning. Pain managed with analgesics. IV pain medications work faster, per patient. Patient reports wanting to know information, but defers most decisions to his sister.  Objective: BP (!) 86/60 (BP Location: Right Arm)    Pulse 93    Temp 99.5 F (37.5 C) (Oral)    Resp 14    Ht 6\' 2"  (1.88 m)    Wt 52.6 kg    SpO2 100%    BMI 14.89 kg/m   Examination:  General exam: Appears calm and comfortable. Chronically ill appearing. Cachectic. Respiratory system: Clear to auscultation. Respiratory effort normal. Cardiovascular system: S1 & S2 heard Gastrointestinal system: Abdomen is scaphoid, soft and nontender. No organomegaly or masses felt. Normal bowel sounds heard. Central nervous system: Alert and oriented. No focal neurological deficits. Musculoskeletal: No edema. No calf tenderness Skin: No cyanosis. No rashes Psychiatry: Judgement and insight appear normal. Blunt affect  Data Reviewed: I have personally reviewed following labs and imaging studies  CBC Lab Results  Component Value Date   WBC 8.3 03/27/2021   RBC 3.10 (L) 03/27/2021   HGB 9.1 (L) 03/27/2021   HCT 28.0 (L) 03/27/2021   MCV 90.3 03/27/2021   MCH 29.4 03/27/2021   PLT 76 (L) 03/27/2021   MCHC 32.5 03/27/2021   RDW 20.5 (H) 03/27/2021   LYMPHSABS 3.4 03/25/2021   MONOABS 1.2 (H) 03/25/2021   EOSABS 0.0 03/25/2021   BASOSABS 0.0 46/65/9935     Last metabolic panel Lab Results  Component Value Date   NA 137 03/27/2021   K 3.4 (L) 03/27/2021   CL 102 03/27/2021   CO2 27 03/27/2021   BUN 10 03/27/2021   CREATININE 0.56 (L) 03/27/2021   GLUCOSE 91 03/27/2021   GFRNONAA >60 03/27/2021   GFRAA >60 10/12/2019   CALCIUM 7.4 (L) 03/27/2021   PHOS 1.7 (L) 03/27/2021   PROT 6.2 (L) 03/27/2021   ALBUMIN 2.9 (L) 03/27/2021   LABGLOB 3.2 03/28/2016   BILITOT 0.6 03/27/2021   ALKPHOS 726 (H) 03/27/2021   AST 23 03/27/2021   ALT 16 03/27/2021   ANIONGAP 8 03/27/2021    GFR: Estimated Creatinine Clearance: 74 mL/min (A) (by C-G formula based on SCr of 0.56 mg/dL (L)).  No results found for this or any previous visit (from the past 240 hour(s)).    Radiology Studies: MR BRAIN W WO CONTRAST  Result Date: 03/26/2021 CLINICAL DATA:  Known metastatic prostate cancer, nystagmus EXAM: MRI HEAD WITHOUT AND WITH CONTRAST TECHNIQUE: Multiplanar, multiecho pulse sequences of the brain and surrounding structures were obtained without and with intravenous contrast. CONTRAST:  69mL GADAVIST GADOBUTROL 1 MMOL/ML IV SOLN COMPARISON:  No direct comparison study available. PET-CT 08/14/2020 FINDINGS: Axial T2 sequence was not performed due to difficulty with patient cooperation. Brain: There is no evidence of acute intracranial hemorrhage, extra-axial fluid collection, or acute infarct. Background parenchymal volume is normal. The ventricles are normal in size. Scattered small foci of FLAIR signal  abnormality in the subcortical and periventricular white matter are nonspecific but most likely reflects sequela of mild chronic white matter microangiopathy. There is no suspicious parenchymal signal abnormality. There is no abnormal enhancement. There is no mass lesion, mass effect, or midline shift. Vascular: The major flow voids appear present on the coronal T2 sequence. Skull and upper cervical spine: Marrow signal is diffusely abnormal consistent with widespread osseous metastatic disease. Sinuses/Orbits: The imaged paranasal sinuses appear clear. The globes and orbits are grossly unremarkable. Other: None. IMPRESSION: 1. No acute intracranial pathology or evidence of intracranial metastatic disease. 2. Mild chronic white matter microangiopathy. 3. Diffusely abnormal marrow signal consistent with osseous metastatic disease. Electronically Signed   By: Valetta Mole M.D.   On: 03/26/2021 15:00   MR Lumbar Spine W Wo Contrast  Result Date: 03/26/2021 CLINICAL DATA:  History of prostate cancer with widespread metastatic disease, urinary retention EXAM: MRI LUMBAR SPINE WITHOUT AND WITH CONTRAST TECHNIQUE: Multiplanar and multiecho pulse sequences of the lumbar spine were obtained without and with intravenous contrast. CONTRAST:  16mL GADAVIST GADOBUTROL 1 MMOL/ML IV SOLN COMPARISON:  Lumbar spine MRI 04/14/2017, CT abdomen/pelvis 03/25/2021 FINDINGS: Segmentation: Standard; the lowest formed disc space is designated L5-S1. Alignment:  Normal. Vertebrae: T1 marrow signal is diffusely hypointense with heterogeneous areas of enhancement in the L2, L3, S1, and S2 vertebral bodies, right L5 pedicle, as well as in the bilateral iliac bones, likely reflecting metastatic disease. There is  no epidural extension of tumor. There is no evidence of acute injury Conus medullaris and cauda equina: Conus extends to the L1 level. Conus and cauda equina appear normal. There is no abnormal enhancement of the cauda equina nerve  roots. Paraspinal and other soft tissues: The paraspinal soft tissues are unremarkable. The abdominal and pelvic viscera are assessed on the CT abdomen/pelvis dated 1 day prior. Disc levels: The disc heights are overall preserved. There is mild multilevel facet arthropathy throughout the lumbar spine, most advanced at L4-L5 and L5-S1, with an associated left facet joint effusions at L5-S1. T12-L1: No significant spinal canal or neural foraminal stenosis. L1-L2: No significant spinal canal or neural foraminal stenosis L2-L3: There is a mild right subarticular/foraminal disc protrusion resulting in mild narrowing of the right subarticular zone without evidence of nerve root impingement, and no significant spinal canal or neural foraminal stenosis L3-L4: Is a mild right foraminal disc protrusion and mild bilateral facet arthropathy without significant spinal canal or neural foraminal stenosis. L4-L5: There is a right foraminal/extraforaminal disc protrusion which may contact the exiting right L4 nerve root (21-5). There is mild bilateral facet arthropathy without significant spinal canal or neural foraminal stenosis L5-S1: There is a mild disc bulge and bilateral facet arthropathy resulting in mild left and no significant right neural foraminal stenosis and no significant spinal canal stenosis. IMPRESSION: 1. Diffusely abnormal marrow signal with patchy areas of enhancement as described above consistent with with osseous metastatic disease. No epidural extension of tumor or cord/cauda equina nerve root compression. 2. Right foraminal/extraforaminal disc protrusion at L4-L5 which may contact the exiting right L4 nerve root. 3. Facet arthropathy most advanced at L4-L5 and L5-S1 with an associated effusion on the left at L5-S1. 4. No evidence of acute injury. Electronically Signed   By: Valetta Mole M.D.   On: 03/26/2021 14:45   CT Angio Abd/Pel W and/or Wo Contrast  Result Date: 03/25/2021 CLINICAL DATA:  Acute  mesenteric ischemia, urinary retention EXAM: CTA ABDOMEN AND PELVIS WITHOUT AND WITH CONTRAST TECHNIQUE: Multidetector CT imaging of the abdomen and pelvis was performed using the standard protocol during bolus administration of intravenous contrast. Multiplanar reconstructed images and MIPs were obtained and reviewed to evaluate the vascular anatomy. RADIATION DOSE REDUCTION: This exam was performed according to the departmental dose-optimization program which includes automated exposure control, adjustment of the mA and/or kV according to patient size and/or use of iterative reconstruction technique. CONTRAST:  12mL OMNIPAQUE IOHEXOL 350 MG/ML SOLN COMPARISON:  CT abdomen pelvis 02/21/2021 FINDINGS: VASCULAR Aorta: Normal caliber aorta without aneurysm, dissection, vasculitis or significant stenosis. Celiac: Patent without evidence of aneurysm, dissection, vasculitis or significant stenosis. SMA: Patent without evidence of aneurysm, dissection, vasculitis or significant stenosis. Renals: Single right and dual left renal arteries are noted. There is a high-grade focal stenosis involving the proximal aspect of the main left renal artery. No significant atherosclerotic plaque is identified in this location. This may reflect changes of adventitial or Peri adventitial fibromuscular dysplasia, neurofibromatosis, or represent the remote sequela of large vessel vasculitis. No aneurysm or dissection. IMA: Patent without evidence of aneurysm, dissection, vasculitis or significant stenosis. Inflow: Patent without evidence of aneurysm, dissection, vasculitis or significant stenosis. Proximal Outflow: Bilateral common femoral and visualized portions of the superficial and profunda femoral arteries are patent without evidence of aneurysm, dissection, vasculitis or significant stenosis. Veins: The venous vasculature is unremarkable. Specifically, the superior and inferior mesenteric vein is, splenic veins, and portal vein are  patent. Review of the MIP  images confirms the above findings. NON-VASCULAR Lower chest: No acute abnormality. Hepatobiliary: No focal liver abnormality is seen. No gallstones, gallbladder wall thickening, or biliary dilatation. Pancreas: Stable 3.2 cm cyst within the tail the pancreas within the splenic hilum. The pancreas is otherwise unremarkable. Spleen: Unremarkable Adrenals/Urinary Tract: The adrenal glands are unremarkable. The kidneys are normal. The Foley catheter balloon is not clearly position within the bladder lumen and may be inflated within the prostatic urethra. The tip of the catheter, additionally, is not clearly within the bladder lumen, best seen on sagittal image # 86/14. The bladder is not distended. Stomach/Bowel: The stomach, small bowel, and large bowel are unremarkable. No free intraperitoneal gas or fluid. Lymphatic: No pathologic adenopathy within the abdomen and pelvis. Reproductive: The prostate gland is otherwise unremarkable. Other: There is marked body wall wasting with diffuse infiltration of the subcutaneous and retroperitoneal fat in keeping with anasarca, possibly related to hypoproteinemia Musculoskeletal: Diffuse sclerotic metastases are seen throughout the visualized axial skeleton in keeping with the patient's given history of prostate cancer. 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 See differential considerations above. Correlation for signs and symptoms of clinically significant renal artery stenosis is recommended. No radiologic evidence of acute or chronic mesenteric ischemia. NON-VASCULAR Foley catheter balloon is not clearly position within the bladder lumen and may be within the prostatic urethra. Clinical correlation is suggested. The bladder does not appear distended, however. Marked body wall wasting, similar to prior examination, with diffuse infiltration of the subcutaneous and  retroperitoneal fat possibly related to hypoproteinemia and resultant anasarca. Diffuse osseous sclerotic metastases in keeping with the patient's given history of prostate cancer. Electronically Signed   By: Fidela Salisbury M.D.   On: 03/25/2021 23:14      LOS: 1 day    Cordelia Poche, MD [facility] 03/27/2021, 3:05 PM   If 7PM-7AM, please contact night-coverage [email or web address]

## 2021-03-27 NOTE — Progress Notes (Signed)
Physical Therapy Treatment ?Patient Details ?Name: Ryan Davis ?MRN: [id number] ?DOB: 16-Jul-1960 ?Today's Date: 03/27/2021 ? ? ?History of Present Illness 61 yo male presents with acute abdominal pain and inability to urinate. 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 s/p 1 unit PRBCs. New nystagmus noted on hospital day 1- Brain MRI 2/28: No acute intracranial pathology or evidence of intracranial metastatic disease. PMH: metastatic prostate cancer with chronic pain, afib, LLE DVT ? ?  ?PT Comments  ? ? Pt plan is now to d/c home with hospice and support of family, PT goal was to support pt in transition to home and practice functional mobility which pt wants to maintain at d/c. Pt ambulatory for short room distance, benefits from use of RW for stability. Pt also instructed in LE exercises to maintain ROM and strength at home, pt tolerated well. PT to sign off, pt to d/c home tomorrow per pt and has great support of family.  ?   ?Recommendations for follow up therapy are one component of a multi-disciplinary discharge planning process, led by the attending physician.  Recommendations may be updated based on patient status, additional functional criteria and insurance authorization. ? ?Follow Up Recommendations ? Other (comment) (home with hospice) ?  ?  ?Assistance Recommended at Discharge Frequent or constant Supervision/Assistance  ?Patient can return home with the following A little help with walking and/or transfers;A little help with bathing/dressing/bathroom;Assistance with cooking/housework;Assist for transportation;Help with stairs or ramp for entrance ?  ?Equipment Recommendations ? Other (comment) (TBD)  ?  ?Recommendations for Other Services   ? ? ?  ?Precautions / Restrictions Precautions ?Precautions: Fall ?Restrictions ?Weight Bearing Restrictions: No  ?  ? ?Mobility ? Bed  Mobility ?Overal bed mobility: Needs Assistance ?Bed Mobility: Supine to Sit, Sit to Supine ?  ?  ?Supine to sit: Min guard ?Sit to supine: Min assist ?  ?General bed mobility comments: assist for LE lifting into bed ?  ? ?Transfers ?Overall transfer level: Needs assistance ?Equipment used: Rolling walker (2 wheels) ?Transfers: Sit to/from Stand ?  ?  ?  ?  ?  ? Lateral/Scoot Transfers: Min guard ?General transfer comment: close guard for safety, verbal cuing for hand placement when rising/sitting ?  ? ?Ambulation/Gait ?Ambulation/Gait assistance: Min guard ?Gait Distance (Feet): 15 Feet ?Assistive device: Rolling walker (2 wheels) ?Gait Pattern/deviations: Step-through pattern, Decreased stride length, Trunk flexed ?Gait velocity: decr ?  ?  ?General Gait Details: cues for upright posture, placement in RW, room navigation ? ? ?Stairs ?  ?  ?  ?  ?  ? ? ?Wheelchair Mobility ?  ? ?Modified Rankin (Stroke Patients Only) ?  ? ? ?  ?Balance Overall balance assessment: Needs assistance ?Sitting-balance support: Feet supported ?Sitting balance-Leahy Scale: Good ?  ?  ?Standing balance support: Bilateral upper extremity supported, During functional activity ?Standing balance-Leahy Scale: Poor ?  ?  ?  ?  ?  ?  ?  ?  ?  ?  ?  ?  ?  ? ?  ?Cognition Arousal/Alertness: Awake/alert ?Behavior During Therapy: Flat affect ?Overall Cognitive Status: Within Functional Limits for tasks assessed ?  ?  ?  ?  ?  ?  ?  ?  ?  ?  ?  ?  ?  ?  ?  ?  ?  ?  ?  ? ?  ?Exercises General Exercises -  Lower Extremity ?Quad Sets: AROM, Both, 10 reps, Supine ?Heel Slides: AROM, Both, 10 reps, Supine ?Other Exercises ?Other Exercises: supine bridges x10 ? ?  ?General Comments   ?  ?  ? ?Pertinent Vitals/Pain Pain Assessment ?Pain Assessment: Faces ?Faces Pain Scale: Hurts a little bit ?Pain Location: catheter placement ?Pain Descriptors / Indicators: Discomfort, Guarding ?Pain Intervention(s): Monitored during session  ? ? ?Home Living   ?  ?  ?  ?  ?   ?  ?  ?  ?  ?   ?  ?Prior Function    ?  ?  ?   ? ?PT Goals (current goals can now be found in the care plan section) Acute Rehab PT Goals ?Patient Stated Goal: home with family assisting ?PT Goal Formulation: With patient/family ?Time For Goal Achievement: 04/09/21 ?Potential to Achieve Goals: Fair ?Progress towards PT goals: Progressing toward goals ? ?  ?Frequency ? ? ? Min 3X/week ? ? ? ?  ?PT Plan Current plan remains appropriate  ? ? ?Co-evaluation   ?  ?  ?  ?  ? ?  ?AM-PAC PT "6 Clicks" Mobility   ?Outcome Measure ? Help needed turning from your back to your side while in a flat bed without using bedrails?: A Little ?Help needed moving from lying on your back to sitting on the side of a flat bed without using bedrails?: A Little ?Help needed moving to and from a bed to a chair (including a wheelchair)?: A Little ?Help needed standing up from a chair using your arms (e.g., wheelchair or bedside chair)?: A Little ?Help needed to walk in hospital room?: A Little ?Help needed climbing 3-5 steps with a railing? : A Lot ?6 Click Score: 17 ? ?  ?End of Session   ?Activity Tolerance: Patient limited by fatigue ?Patient left: in bed;with call bell/phone within reach;with bed alarm set;with family/visitor present ?Nurse Communication: Mobility status;Other (comment) (nystagmus) ?PT Visit Diagnosis: Other abnormalities of gait and mobility (R26.89);Muscle weakness (generalized) (M62.81);Difficulty in walking, not elsewhere classified (R26.2) ?  ? ? ?Time: 1520-1540 ?PT Time Calculation (min) (ACUTE ONLY): 20 min ? ?Charges:  $Therapeutic Activity: 8-22 mins          ?          ? ?Stacie Glaze, PT DPT ?Acute Rehabilitation Services ?Pager [phone]  ?Office [phone] ? ? ? ?Clora Ohmer E Stroup ?03/27/2021, 4:18 PM ? ?

## 2021-03-27 NOTE — Progress Notes (Signed)
Manufacturing engineer [facility]) Hospital Liaison Note ? ?Referral received from [facility] for patient/family interest in hospice at home. [place] liaison spoke with patient and his sister Juliann Pulse to confirm interest.  ? ?Hospice eligibility pending. ? ?DME in the home: [facility] ? ?DME needs: hospital bed, shower chair, and transport wheelchair.  ? ?Juliann Pulse is the contact for DME delivery. Plan is to discharge home tomorrow after DME has been delivered.  ? ?Please send patient home with prescriptions/comfort medications at discharge.  ? ?Please call with any questions or concerns. Thank you.  ? ?Roselee Nova, LCSW ?[facility] Liaison ?[phone] ? ? ?

## 2021-03-27 NOTE — TOC Initial Note (Signed)
Transition of Care (TOC) - Initial/Assessment Note  ? ? ?Patient Details  ?Name: Ryan Davis ?MRN: [id number] ?Date of Birth: 06-29-1960 ? ?Transition of Care (TOC) CM/SW Contact:    ?Naela Nodal, LCSW ?Phone Number: ?03/27/2021, 1:00 PM ? ?Clinical Narrative:                 ?Met with pt and sisters today to introduce self/ TOC role and review recommendations for home with Hospice care.  Pt and sister aware and agreeable with this plan and choosing [facility] for hospice ageny.  Referral placed and liaison should be meeting with them today.  MD hopeful pt may be ready for dc tomorrow. Await confirmation that pt is accepted by [facility].   ? ? ?Expected Discharge Plan: [place] ?Barriers to Discharge: Continued Medical Work up ? ? ?Patient Goals and CMS Choice ?Patient states their goals for this hospitalization and ongoing recovery are:: return home ?  ?Choice offered to / list presented to : [facility] POA / Guardian, Patient ? ?Expected Discharge Plan and Services ?Expected Discharge Plan: [place] ?In-house Referral: Clinical Social Work ?  ?Post Acute Care Choice: Hospice ?Living arrangements for the past 2 months: [place] ?                ?  ?  ?  ?  ?  ?HH Arranged: RN ?[place] Agency: [facility]ospice and [place] (now Manufacturing engineer ([place])) ?Date HH Agency Contacted: 03/27/21 ?Time [place]: [id number] ?Representative spoke with at [place]: Fabio Pierce ? ?Prior Living Arrangements/Services ?Living arrangements for the past 2 months: [place] ?Lives with:: Siblings ?Patient language and need for interpreter reviewed:: Yes ?Do you feel safe going back to the place where you live?: Yes      ?Need for Family Participation in Patient Care: Yes (Comment) ?Care giver support system in place?: Yes (comment) ?  ?  ? ?Activities of Daily Living ?Home Assistive Devices/Equipment: Cane (specify quad or straight) ?ADL Screening (condition at time of admission) ?Patient's  cognitive ability adequate to safely complete daily activities?: Yes ?Is the patient deaf or have difficulty hearing?: No ?Does the patient have difficulty seeing, even when wearing glasses/contacts?: No ?Does the patient have difficulty concentrating, remembering, or making decisions?: No ?Patient able to express need for assistance with ADLs?: Yes ?Does the patient have difficulty dressing or bathing?: No ?Independently performs ADLs?: Yes (appropriate for developmental age) ?Does the patient have difficulty walking or climbing stairs?: Yes ?Weakness of Legs: None ?Weakness of Arms/Hands: None ? ?Permission Sought/Granted ?Permission sought to share information with : Family Supports ?Permission granted to share information with : Yes, Verbal Permission Granted ? Share Information with NAME: Ryan Davis ?   ? Permission granted to share info w Relationship: sister ? Permission granted to share info w Contact Information: [phone] ? ?Emotional Assessment ?Appearance:: Appears older than stated age ?Attitude/Demeanor/Rapport: Gracious ?Affect (typically observed): Accepting, Pleasant, Quiet ?Orientation: : Oriented to Self, Oriented to Place, Oriented to  Time, Oriented to Situation ?Alcohol / Substance Use: Not Applicable ?Psych Involvement: No (comment) ? ?Admission diagnosis:  Urinary retention [R33.9] ?Generalized abdominal pain [R10.84] ?Intractable abdominal pain [R10.9] ?Malignant neoplasm of prostate metastatic to bone ([place]) [C61, C79.51] ?Anemia, unspecified type [D64.9] ?Metastatic cancer ([place]) [C79.9] ?Patient Active Problem List  ? Diagnosis Date Noted  ? Symptomatic anemia 03/26/2021  ? Thrombocytopenia ([place]) 03/26/2021  ? Left leg DVT ([place]) 03/26/2021  ? Nystagmus 03/26/2021  ?  Folate deficiency 03/26/2021  ? Renal artery stenosis ([employer]) 03/26/2021  ? Metastatic cancer ([place]) 03/26/2021  ? Anemia   ? Urinary retention   ? Intractable abdominal pain 12/04/2020  ? Hypokalemia due to excessive  gastrointestinal loss of potassium 12/04/2020  ? Dilation of biliary tract 12/04/2020  ? Abnormal LFTs 12/04/2020  ? GERD without esophagitis 12/04/2020  ? Abdominal pain 10/18/2020  ? Cancer related pain 12/02/2017  ? Goals of care, counseling/discussion 06/24/2017  ? Prostate cancer metastatic to multiple sites [facility]) 03/31/2016  ? Prostate cancer metastatic to bone ([place]) 03/31/2016  ? AF (paroxysmal atrial fibrillation) ([place]) 03/30/2016  ? Back pain without radiculopathy 03/28/2016  ? Atrial fibrillation with RVR ([place]) 03/28/2016  ? ?PCP:  Robert Bellow, PA-C ?Pharmacy:   ?[id number] - [place] ?[address] ?[place] [address] ?Phone: [phone] Fax: [phone] ? ? ? ? ?Social Determinants of Health (SDOH) Interventions ?  ? ?Readmission Risk Interventions ?No flowsheet data found. ? ? ?

## 2021-03-27 NOTE — Assessment & Plan Note (Addendum)
Continue folic acid ?

## 2021-03-28 ENCOUNTER — Inpatient Hospital Stay: Payer: Medicare Other

## 2021-03-28 DIAGNOSIS — R52 Pain, unspecified: Secondary | ICD-10-CM

## 2021-03-28 DIAGNOSIS — Z515 Encounter for palliative care: Secondary | ICD-10-CM

## 2021-03-28 DIAGNOSIS — Z7189 Other specified counseling: Secondary | ICD-10-CM

## 2021-03-28 MED ORDER — MECLIZINE HCL 25 MG PO TABS: 25.0000 mg | ORAL_TABLET | Freq: Three times a day (TID) | ORAL | 0 refills | Status: AC | PRN

## 2021-03-28 MED ORDER — HEPARIN SOD (PORK) LOCK FLUSH 100 UNIT/ML IV SOLN
500.0000 [IU] | INTRAVENOUS | Status: AC | PRN
  Administered 2021-03-28: 500 [IU]
  Filled 2021-03-28: qty 5

## 2021-03-28 MED ORDER — ADULT MULTIVITAMIN W/MINERALS CH: 1.0000 | ORAL_TABLET | Freq: Every day | ORAL | Status: AC

## 2021-03-28 MED ORDER — ADULT MULTIVITAMIN W/MINERALS CH
1.0000 | ORAL_TABLET | Freq: Every day | ORAL | Status: DC
  Administered 2021-03-28: 1 via ORAL
  Filled 2021-03-28: qty 1

## 2021-03-28 MED ORDER — PREDNISONE 10 MG PO TABS: ORAL_TABLET | ORAL | 0 refills | Status: AC

## 2021-03-28 MED ORDER — FOLIC ACID 1 MG PO TABS: 1.0000 mg | ORAL_TABLET | Freq: Every day | ORAL | Status: AC

## 2021-03-28 MED ORDER — BOOST / RESOURCE BREEZE PO LIQD CUSTOM
1.0000 | Freq: Three times a day (TID) | ORAL | Status: DC
  Administered 2021-03-28: 1 via ORAL

## 2021-03-28 NOTE — Plan of Care (Signed)
  Problem: Education: Goal: Knowledge of General Education information will improve Description: Including pain rating scale, medication(s)/side effects and non-pharmacologic comfort measures Outcome: Adequate for Discharge   Problem: Health Behavior/Discharge Planning: Goal: Ability to manage health-related needs will improve Outcome: Adequate for Discharge   Problem: Clinical Measurements: Goal: Ability to maintain clinical measurements within normal limits will improve Outcome: Adequate for Discharge Goal: Will remain free from infection Outcome: Adequate for Discharge Goal: Diagnostic test results will improve Outcome: Adequate for Discharge Goal: Respiratory complications will improve Outcome: Adequate for Discharge Goal: Cardiovascular complication will be avoided Outcome: Adequate for Discharge   Problem: Activity: Goal: Risk for activity intolerance will decrease Outcome: Adequate for Discharge   Problem: Nutrition: Goal: Adequate nutrition will be maintained Outcome: Adequate for Discharge   Problem: Coping: Goal: Level of anxiety will decrease Outcome: Adequate for Discharge   Problem: Elimination: Goal: Will not experience complications related to bowel motility Outcome: Adequate for Discharge Goal: Will not experience complications related to urinary retention Outcome: Adequate for Discharge   Problem: Pain Managment: Goal: General experience of comfort will improve Outcome: Adequate for Discharge   Problem: Safety: Goal: Ability to remain free from injury will improve Outcome: Adequate for Discharge   Problem: Skin Integrity: Goal: Risk for impaired skin integrity will decrease Outcome: Adequate for Discharge   Problem: Acute Rehab PT Goals(only PT should resolve) Goal: Pt Will Go Supine/Side To Sit Outcome: Adequate for Discharge Goal: Patient Will Transfer Sit To/From Stand Outcome: Adequate for Discharge Goal: Pt Will Ambulate Outcome: Adequate  for Discharge Goal: Pt/caregiver will Perform Home Exercise Program Outcome: Adequate for Discharge   

## 2021-03-28 NOTE — TOC Progression Note (Addendum)
Transition of Care (TOC) - Progression Note  ? ? ?Patient Details  ?Name: Ryan Davis ?MRN: [id number] ?Date of Birth: 06/27/1960 ? ?Transition of Care (TOC) CM/SW Contact  ?Leeroy Cha, RN ?Phone Number: ?03/28/2021, 12:25 PM ? ?Clinical Narrative:    ?Tct-[facility]-mary ann-will let the team know that patient is dcd.  Will follow for transport needs. ?Patient and wife have decided that they do not want or need home hospice at this time.  Dme requested ordered and will be sent to the home. ? ?Expected Discharge Plan: [place] ?Barriers to Discharge: Continued Medical Work up ? ?Expected Discharge Plan and Services ?Expected Discharge Plan: [place] ?In-house Referral: Clinical Social Work ?  ?Post Acute Care Choice: Hospice ?Living arrangements for the past 2 months: [place] ?Expected Discharge Date: 03/28/21               ?  ?  ?  ?  ?  ?HH Arranged: RN ?[place] Agency: Hosp[facility] and [place] (now Manufacturing engineer ([place])) ?Date HH Agency Contacted: 03/27/21 ?Time [place]: [phone] ?Representative spoke with at [place]: Fabio Pierce ? ? ?Social Determinants of Health (SDOH) Interventions ?  ? ?Readmission Risk Interventions ?No flowsheet data found. ? ?

## 2021-03-28 NOTE — Progress Notes (Signed)
Nutrition Note ? ?Patient identified on the Malnutrition Screening Tool (MST) Report ? ?Noted that patient has discharge order today home with hospice.  ?Spoke with pt at bedside, states he has been eating and ate most of his breakfast this morning of pancakes, sausage, oatmeal, banana, cereal and hot chocolate. ?Pt states he does not like Ensure supplements d/t consistency. Open to receiving Boost Breeze. ? ?Wt Readings from Last 15 Encounters:  ?03/25/21 52.6 kg  ?03/18/21 52.7 kg  ?03/07/21 54.9 kg  ?02/07/21 57.6 kg  ?01/23/21 57 kg  ?01/14/21 59.4 kg  ?01/14/21 59.4 kg  ?01/03/21 60.1 kg  ?12/25/20 65.8 kg  ?12/15/20 65.8 kg  ?12/14/20 66 kg  ?12/13/20 66.2 kg  ?12/07/20 73.5 kg  ?11/14/20 74.8 kg  ?11/12/20 73 kg  ? ? ?Body mass index is 14.89 kg/m?Marland Kitchen Patient meets criteria for underweight based on current BMI.  ? ?Current diet order is regular, patient is consuming approximately 25-100% of meals at this time. Labs and medications reviewed.  ? ?If nutrition issues arise, please consult RD.  ? ?Clayton Bibles, MS, RD, LDN ?Inpatient Clinical Dietitian ?Contact information available via Amion ? ?

## 2021-03-28 NOTE — Discharge Summary (Addendum)
Physician Discharge Summary   Patient: Ryan Davis MRN: [id number] DOB: Oct 31, 1960  Admit date:     03/25/2021  Discharge date: 03/28/21  Discharge Physician: Cordelia Poche, MD   PCP: Robert Bellow, PA-C   Recommendations at discharge:   Initially home with hospice. Patient has now declined services. PCP/oncology follow-up  Discharge Diagnoses: Principal Problem:   Intractable abdominal pain Active Problems:   Hypokalemia due to excessive gastrointestinal loss of potassium   AF (paroxysmal atrial fibrillation) (HCC)   Prostate cancer metastatic to bone (HCC)   Cancer related pain   Symptomatic anemia   Thrombocytopenia (HCC)   Left leg DVT (HCC)   Anemia   Urinary retention   Nystagmus   Folate deficiency   Renal artery stenosis (HCC)   Metastatic cancer (HCC)  Resolved Problems:   * No resolved hospital problems. *   Hospital Course: 61 yo with hx metastatic prostate cancer, LLE DVT on eliquis, atrial fibrillation who presented with severe abdominal pain and difficulty urinating.  A foley catheter was placed with improvement of symptoms.  An MRI lumbar spine was ordered due to new urinary retention in setting of metastatic prostate cancer.  An MRI brain was ordered due to new nystagmus noted on hospital day 1.  He was also noted to be anemic and thrombocytopenic without evidence of bleeding, he was continued cautiously on his prior to admission eliquis.  Will discuss his care with Dr. Marin Olp. MRIs without etiology for retention or nystagmus. Initial plan for discharge home with hospice, however just prior to discharge, patient/family declined hospice services.  Assessment and Plan: * Intractable abdominal pain Resolved. Suspected to be related to urinary retention and underlying metastatic cancer. Patient is managed on chronic analgesic regimen. Continue on discharge.  Renal artery stenosis (HCC) High grade foral stenosis of proximal main L renal artery  Folate  deficiency Continue folic acid  Nystagmus New onset. MRI brain obtained without clear etiology. Neurology curbside recommendations included empiric treatment for vestibular neuritis with steroid taper and meclizine PRN. Improved. Continue prednisone taper on discharge.  Urinary retention Recurrent issue. Foley catheter placed this admission. MRI without neurologic etiology for retention. Prior history of obstructive disease. Patient requesting removal of foley catheter prior to discharge and was able to urinate without foley.  Left leg DVT (HCC) Continue home Eliquis  Thrombocytopenia (HCC) Stable. Oncology recommends continued use of Eliquis since patient has no evidence of active bleeding.  Symptomatic anemia Baseline hemoglobin of about 10. Hemoglobin down to 6.8 on admission. Patient received 1 unit of PRBC. No bleeding noted. Labs consistent with anemia of chronic disease/folate deficiency. Posttransfusion hemoglobin of 8.3 > 9.1. Complicated by metastatic cancer to bone. Continue folic acid  Cancer related pain See problem, Intractable abdominal pain  Prostate cancer metastatic to bone [facility]) Recommendation for hospice care per medical oncology. TOC consulted for home with hospice referral.  AF (paroxysmal atrial fibrillation) ([place]) Continue metoprolol and Eliquis        Consultants: Medical oncology Procedures performed: None  Disposition: Home Diet recommendation:  Regular diet  DISCHARGE MEDICATION: Allergies as of 03/28/2021       Reactions   Contrast Media [iodinated Contrast Media] Nausea And Vomiting   Patient vomits immediately after IV contrast is injected just prior to start of scan, was unable to scan patient in correct time frame.  Patient did not warn me of this when dosing the patient with oral CM and asking the patient history questions until the patient was on  the table for the CT scan just prior to injection.  Ct Tech:  Karl Bales 07/29/17    Other  Nausea And Vomiting   Patient vomits immediately after IV contrast is injected just prior to start of scan, was unable to scan patient in correct time frame.  Patient did not warn me of this when dosing the patient with oral CM and asking the patient history questions until the patient was on the table for the CT scan just prior to injection.  Ct Tech:  Karl Bales 07/29/17         Medication List     TAKE these medications    cyclobenzaprine 10 MG tablet Commonly known as: FLEXERIL Take 10 mg by mouth 3 (three) times daily as needed for muscle spasms.   dronabinol 2.5 MG capsule Commonly known as: MARINOL Take 2.5 mg by mouth 2 (two) times daily before a meal. 12p and 6p What changed: Another medication with the same name was removed. Continue taking this medication, and follow the directions you see here.   Eliquis DVT/PE Starter Pack Generic drug: Apixaban Starter Pack (10mg  and 5mg ) Take 5-10 mg by mouth See admin instructions. Take as directed on package: start with two-5mg  tablets twice daily for 7 days. On day 8, switch to one-5mg  tablet twice daily.   fentaNYL 100 MCG/HR Commonly known as: [place] 1 patch onto the skin every other day.   folic acid 1 MG tablet Commonly known as: FOLVITE Take 1 tablet (1 mg total) by mouth daily. Start taking on: March 29, 2021   gabapentin 300 MG capsule Commonly known as: NEURONTIN Take 1 capsule in the morning (300 mg) and take 2 capsules (600 mg) at bedtime. What changed:  how much to take how to take this when to take this additional instructions   HYDROmorphone 2 MG tablet Commonly known as: Dilaudid Take 1 tablet (2 mg total) by mouth every 6 (six) hours as needed for severe pain or moderate pain.   lactulose 10 GM/15ML solution Commonly known as: CHRONULAC Take 15 mLs (10 g total) by mouth daily as needed for mild constipation, severe constipation or moderate constipation. What changed: when to take this    LORazepam 0.5 MG tablet Commonly known as: ATIVAN Take 1 tablet (0.5 mg total) by mouth every 8 (eight) hours as needed for anxiety. What changed:  when to take this reasons to take this   meclizine 25 MG tablet Commonly known as: ANTIVERT Take 1 tablet (25 mg total) by mouth 3 (three) times daily as needed for dizziness (nystagmus).   metoprolol tartrate 25 MG tablet Commonly known as: LOPRESSOR TAKE 1/2 TABLET(12.5 MG) BY MOUTH TWICE DAILY What changed: See the new instructions.   multivitamin with minerals Tabs tablet Take 1 tablet by mouth daily. Start taking on: March 29, 2021   naloxegol oxalate 12.5 MG Tabs tablet Commonly known as: MOVANTIK Take 12.5 mg by mouth every morning.   Naloxone HCl 0.4 MG/0.4ML Soaj Use in the event of accidental overdose of opioids. What changed:  how much to take how to take this when to take this reasons to take this additional instructions   pantoprazole 40 MG tablet Commonly known as: Protonix Take 1 tablet (40 mg total) by mouth daily.   polyethylene glycol 17 g packet Commonly known as: MIRALAX / GLYCOLAX Take 17 g by mouth 2 (two) times daily. What changed:  when to take this reasons to take this   predniSONE 10 MG  tablet Commonly known as: DELTASONE Take 4 tablets (40 mg total) by mouth daily with breakfast for 3 days, THEN 3 tablets (30 mg total) daily with breakfast for 1 day, THEN 2 tablets (20 mg total) daily with breakfast for 1 day, THEN 1 tablet (10 mg total) daily with breakfast for 1 day. Start taking on: March 29, 2021   senna 8.6 MG Tabs tablet Commonly known as: SENOKOT Take 2 tablets (17.2 mg total) by mouth 2 (two) times daily.   tamsulosin 0.4 MG Caps capsule Commonly known as: FLOMAX Take 1 capsule (0.4 mg total) by mouth daily.               Durable Medical Equipment  (From admission, onward)           Start     Ordered   03/28/21 1537  For home use only DME Walker rolling  Once        Comments: Needs a transport chair  Question Answer Comment  Walker: Other   Comments transport chair   Patient needs a walker to treat with the following condition Weakness generalized      03/28/21 1537   03/28/21 1525  For home use only DME Other see comment  Once       Comments: Shower chair  Question:  Length of Need  Answer:  Lifetime   03/28/21 1525   03/28/21 1523  For home use only DME Hospital bed  Once       Question Answer Comment  Length of Need Lifetime   Patient has (list medical condition): abdominal pain and cancer   The above medical condition requires: Patient requires the ability to reposition frequently   Bed type Semi-electric      03/28/21 1523             Discharge Exam: Filed Weights   03/25/21 1736  Weight: 52.6 kg   General exam: Appears calm and comfortable Respiratory system: Clear to auscultation. Respiratory effort normal. Cardiovascular system: S1 & S2 heard Gastrointestinal system: Abdomen is scaphoid, soft and nontender. No organomegaly or masses felt. Normal bowel sounds heard. Central nervous system: Alert and oriented. No focal neurological deficits. Musculoskeletal: No edema. No calf tenderness Skin: No cyanosis. No rashes Psychiatry: Judgement and insight appear normal. Mood & affect appropriate.   Condition at discharge: stable  The results of significant diagnostics from this hospitalization (including imaging, microbiology, ancillary and laboratory) are listed below for reference.   Imaging Studies: MR BRAIN W WO CONTRAST  Result Date: 03/26/2021 CLINICAL DATA:  Known metastatic prostate cancer, nystagmus EXAM: MRI HEAD WITHOUT AND WITH CONTRAST TECHNIQUE: Multiplanar, multiecho pulse sequences of the brain and surrounding structures were obtained without and with intravenous contrast. CONTRAST:  31mL GADAVIST GADOBUTROL 1 MMOL/ML IV SOLN COMPARISON:  No direct comparison study available. PET-CT 08/14/2020 FINDINGS: Axial T2  sequence was not performed due to difficulty with patient cooperation. Brain: There is no evidence of acute intracranial hemorrhage, extra-axial fluid collection, or acute infarct. Background parenchymal volume is normal. The ventricles are normal in size. Scattered small foci of FLAIR signal abnormality in the subcortical and periventricular white matter are nonspecific but most likely reflects sequela of mild chronic white matter microangiopathy. There is no suspicious parenchymal signal abnormality. There is no abnormal enhancement. There is no mass lesion, mass effect, or midline shift. Vascular: The major flow voids appear present on the coronal T2 sequence. Skull and upper cervical spine: Marrow signal is diffusely abnormal consistent with  widespread osseous metastatic disease. Sinuses/Orbits: The imaged paranasal sinuses appear clear. The globes and orbits are grossly unremarkable. Other: None. IMPRESSION: 1. No acute intracranial pathology or evidence of intracranial metastatic disease. 2. Mild chronic white matter microangiopathy. 3. Diffusely abnormal marrow signal consistent with osseous metastatic disease. Electronically Signed   By: Valetta Mole M.D.   On: 03/26/2021 15:00   MR Lumbar Spine W Wo Contrast  Result Date: 03/26/2021 CLINICAL DATA:  History of prostate cancer with widespread metastatic disease, urinary retention EXAM: MRI LUMBAR SPINE WITHOUT AND WITH CONTRAST TECHNIQUE: Multiplanar and multiecho pulse sequences of the lumbar spine were obtained without and with intravenous contrast. CONTRAST:  10mL GADAVIST GADOBUTROL 1 MMOL/ML IV SOLN COMPARISON:  Lumbar spine MRI 04/14/2017, CT abdomen/pelvis 03/25/2021 FINDINGS: Segmentation: Standard; the lowest formed disc space is designated L5-S1. Alignment:  Normal. Vertebrae: T1 marrow signal is diffusely hypointense with heterogeneous areas of enhancement in the L2, L3, S1, and S2 vertebral bodies, right L5 pedicle, as well as in the bilateral  iliac bones, likely reflecting metastatic disease. There is no epidural extension of tumor. There is no evidence of acute injury Conus medullaris and cauda equina: Conus extends to the L1 level. Conus and cauda equina appear normal. There is no abnormal enhancement of the cauda equina nerve roots. Paraspinal and other soft tissues: The paraspinal soft tissues are unremarkable. The abdominal and pelvic viscera are assessed on the CT abdomen/pelvis dated 1 day prior. Disc levels: The disc heights are overall preserved. There is mild multilevel facet arthropathy throughout the lumbar spine, most advanced at L4-L5 and L5-S1, with an associated left facet joint effusions at L5-S1. T12-L1: No significant spinal canal or neural foraminal stenosis. L1-L2: No significant spinal canal or neural foraminal stenosis L2-L3: There is a mild right subarticular/foraminal disc protrusion resulting in mild narrowing of the right subarticular zone without evidence of nerve root impingement, and no significant spinal canal or neural foraminal stenosis L3-L4: Is a mild right foraminal disc protrusion and mild bilateral facet arthropathy without significant spinal canal or neural foraminal stenosis. L4-L5: There is a right foraminal/extraforaminal disc protrusion which may contact the exiting right L4 nerve root (21-5). There is mild bilateral facet arthropathy without significant spinal canal or neural foraminal stenosis L5-S1: There is a mild disc bulge and bilateral facet arthropathy resulting in mild left and no significant right neural foraminal stenosis and no significant spinal canal stenosis. IMPRESSION: 1. Diffusely abnormal marrow signal with patchy areas of enhancement as described above consistent with with osseous metastatic disease. No epidural extension of tumor or cord/cauda equina nerve root compression. 2. Right foraminal/extraforaminal disc protrusion at L4-L5 which may contact the exiting right L4 nerve root. 3. Facet  arthropathy most advanced at L4-L5 and L5-S1 with an associated effusion on the left at L5-S1. 4. No evidence of acute injury. Electronically Signed   By: Valetta Mole M.D.   On: 03/26/2021 14:45   CT Angio Abd/Pel W and/or Wo Contrast  Result Date: 03/25/2021 CLINICAL DATA:  Acute mesenteric ischemia, urinary retention EXAM: CTA ABDOMEN AND PELVIS WITHOUT AND WITH CONTRAST TECHNIQUE: Multidetector CT imaging of the abdomen and pelvis was performed using the standard protocol during bolus administration of intravenous contrast. Multiplanar reconstructed images and MIPs were obtained and reviewed to evaluate the vascular anatomy. RADIATION DOSE REDUCTION: This exam was performed according to the departmental dose-optimization program which includes automated exposure control, adjustment of the mA and/or kV according to patient size and/or use of iterative reconstruction technique. CONTRAST:  142mL OMNIPAQUE IOHEXOL 350 MG/ML SOLN COMPARISON:  CT abdomen pelvis 02/21/2021 FINDINGS: VASCULAR Aorta: Normal caliber aorta without aneurysm, dissection, vasculitis or significant stenosis. Celiac: Patent without evidence of aneurysm, dissection, vasculitis or significant stenosis. SMA: Patent without evidence of aneurysm, dissection, vasculitis or significant stenosis. Renals: Single right and dual left renal arteries are noted. There is a high-grade focal stenosis involving the proximal aspect of the main left renal artery. No significant atherosclerotic plaque is identified in this location. This may reflect changes of adventitial or Peri adventitial fibromuscular dysplasia, neurofibromatosis, or represent the remote sequela of large vessel vasculitis. No aneurysm or dissection. IMA: Patent without evidence of aneurysm, dissection, vasculitis or significant stenosis. Inflow: Patent without evidence of aneurysm, dissection, vasculitis or significant stenosis. Proximal Outflow: Bilateral common femoral and visualized  portions of the superficial and profunda femoral arteries are patent without evidence of aneurysm, dissection, vasculitis or significant stenosis. Veins: The venous vasculature is unremarkable. Specifically, the superior and inferior mesenteric vein is, splenic veins, and portal vein are patent. Review of the MIP images confirms the above findings. NON-VASCULAR Lower chest: No acute abnormality. Hepatobiliary: No focal liver abnormality is seen. No gallstones, gallbladder wall thickening, or biliary dilatation. Pancreas: Stable 3.2 cm cyst within the tail the pancreas within the splenic hilum. The pancreas is otherwise unremarkable. Spleen: Unremarkable Adrenals/Urinary Tract: The adrenal glands are unremarkable. The kidneys are normal. The Foley catheter balloon is not clearly position within the bladder lumen and may be inflated within the prostatic urethra. The tip of the catheter, additionally, is not clearly within the bladder lumen, best seen on sagittal image # 86/14. The bladder is not distended. Stomach/Bowel: The stomach, small bowel, and large bowel are unremarkable. No free intraperitoneal gas or fluid. Lymphatic: No pathologic adenopathy within the abdomen and pelvis. Reproductive: The prostate gland is otherwise unremarkable. Other: There is marked body wall wasting with diffuse infiltration of the subcutaneous and retroperitoneal fat in keeping with anasarca, possibly related to hypoproteinemia Musculoskeletal: Diffuse sclerotic metastases are seen throughout the visualized axial skeleton in keeping with the patient's given history of prostate cancer. 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 See differential considerations above. Correlation for signs and symptoms of clinically significant renal artery stenosis is recommended. No radiologic evidence of acute or chronic mesenteric ischemia. NON-VASCULAR Foley catheter balloon  is not clearly position within the bladder lumen and may be within the prostatic urethra. Clinical correlation is suggested. The bladder does not appear distended, however. Marked body wall wasting, similar to prior examination, with diffuse infiltration of the subcutaneous and retroperitoneal fat possibly related to hypoproteinemia and resultant anasarca. Diffuse osseous sclerotic metastases in keeping with the patient's given history of prostate cancer. Electronically Signed   By: Fidela Salisbury M.D.   On: 03/25/2021 23:14    Microbiology: Results for orders placed or performed during the hospital encounter of 12/25/20  Resp Panel by RT-PCR (Flu A&B, Covid) Nasopharyngeal Swab     Status: None   Collection Time: 12/26/20 12:07 AM   Specimen: Nasopharyngeal Swab; Nasopharyngeal(NP) swabs in vial transport medium  Result Value Ref Range Status   SARS Coronavirus 2 by RT PCR NEGATIVE NEGATIVE Final    Comment: (NOTE) SARS-CoV-2 target nucleic acids are NOT DETECTED.  The SARS-CoV-2 RNA is generally detectable in upper respiratory specimens during the acute phase of infection. The lowest concentration of SARS-CoV-2 viral copies this assay can detect is 138 copies/mL. A negative  result does not preclude SARS-Cov-2 infection and should not be used as the sole basis for treatment or other patient management decisions. A negative result may occur with  improper specimen collection/handling, submission of specimen other than nasopharyngeal swab, presence of viral mutation(s) within the areas targeted by this assay, and inadequate number of viral copies(<138 copies/mL). A negative result must be combined with clinical observations, patient history, and epidemiological information. The expected result is Negative.  Fact Sheet for Patients:  EntrepreneurPulse.com.au  Fact Sheet for Healthcare Providers:  IncredibleEmployment.be  This test is no t yet approved  or cleared by the [place] FDA and  has been authorized for detection and/or diagnosis of SARS-CoV-2 by FDA under an Emergency Use Authorization (EUA). This EUA will remain  in effect (meaning this test can be used) for the duration of the COVID-19 declaration under Section 564(b)(1) of the Act, 21 U.S.C.section 360bbb-3(b)(1), unless the authorization is terminated  or revoked sooner.       Influenza A by PCR NEGATIVE NEGATIVE Final   Influenza B by PCR NEGATIVE NEGATIVE Final    Comment: (NOTE) The Xpert Xpress SARS-CoV-2/FLU/RSV plus assay is intended as an aid in the diagnosis of influenza from Nasopharyngeal swab specimens and should not be used as a sole basis for treatment. Nasal washings and aspirates are unacceptable for Xpert Xpress SARS-CoV-2/FLU/RSV testing.  Fact Sheet for Patients: EntrepreneurPulse.com.au  Fact Sheet for Healthcare Providers: IncredibleEmployment.be  This test is not yet approved or cleared by the [place] FDA and has been authorized for detection and/or diagnosis of SARS-CoV-2 by FDA under an Emergency Use Authorization (EUA). This EUA will remain in effect (meaning this test can be used) for the duration of the COVID-19 declaration under Section 564(b)(1) of the Act, 21 U.S.C. section 360bbb-3(b)(1), unless the authorization is terminated or revoked.  Performed at [facility], [address] [place], [place] [address]     Labs: CBC: Recent Labs  Lab 03/25/21 1819 03/26/21 0320 03/26/21 1227 03/27/21 0540  WBC 10.1 9.6  --  8.3  NEUTROABS 4.8  --   --   --   HGB 6.8* 6.8* 8.3* 9.1*  HCT 20.9* 20.5* 25.0* 28.0*  MCV 91.7 91.1  --  90.3  PLT 69* 66*  --  76*   Basic Metabolic Panel: Recent Labs  Lab 03/25/21 1748 03/26/21 0320 03/27/21 0540  NA 132* 136 137  K 3.0* 3.6 3.4*  CL 97* 102 102  CO2 22 25 27   GLUCOSE 123* 134* 91  BUN 9 8 10   CREATININE 0.59* 0.49*  0.56*  CALCIUM 8.4* 8.0* 7.4*  MG 2.0  --  2.1  PHOS  --   --  1.7*   Liver Function Tests: Recent Labs  Lab 03/25/21 1819 03/26/21 0320 03/27/21 0540  AST 21 23 23   ALT 20 20 16   ALKPHOS 744* 748* 726*  BILITOT 0.4 0.6 0.6  PROT 7.1 6.7 6.2*  ALBUMIN 3.4* 3.3* 2.9*    Discharge time spent: 35 minutes.  Signed: Cordelia Poche, MD [facility] 03/28/2021

## 2021-03-28 NOTE — Progress Notes (Signed)
Assessment unchanged. PAC deaccessed by IV team. Pt and sister verbalized understanding of dc instructions through teach back. Discharged via wc to front entrance accompanied by NT, sister and family member. ?

## 2021-03-28 NOTE — Progress Notes (Signed)
Manufacturing engineer [facility]) Hospital Liaison Note ? ?DME delivered. Patient and sister agree to hospice. Admission visit will be scheduled with sister.  ? ?Please call with any questions or concerns. Thank you ? ?Roselee Nova, LCSW ?[facility] Liaison ?[phone] ?

## 2021-03-28 NOTE — Progress Notes (Addendum)
Ryan Davis is about the same.  He is not complaining of any pain.  He says he is urinating.  He said he urinated this morning. ? ?Had a long talk with the and his sister this morning.  It seems like he may go home today.  He actually might be a little bit easier for him if he went home tomorrow as his family still wants to make sure that everything is set up at home for him. ? ?I realize that Hospice will be seeing him.  I know this is a good idea.  I know its been tough for him to agree to this. ? ?We did not talk about end-of-life issues.  I know that when he came in, [place], my wonderful PA, had a long talk with his sister.  I know that they  agreed to DNR but did not want him to really know about this.  Again, I realize the delicate nature of this issue. ? ?He might be eating a little bit more.  I just do not think he is  able to eat much.  I suspect his stomach is quite contracted. ? ?I just want him to have comfort, respect and dignity.  I know that Hospice can help at home. ? ?I know that he does have marrow involvement by his prostate cancer.  This is why has the anemia and thrombocytopenia. ? ?I know that it has been tough on him knowing that his prognosis is such short-term.  I tried to encourage him and let him know that I am not the one who decides what he "goes home."  I know that he and his family do have a strong faith.  I know that he will rely on that faith.  I told him that he will have days that he may feel like doing more and then days in which he will not feel like doing much. ? ?I know that he has had a tough time with his prostate cancer.  He clearly has had aggressive disease.  He only had temporary responses to therapy. ? ?Again, I think it would be better if he went home on Friday.  I think this will allow his family more time to get everything set up for him at home so that a transition would be much smoother. ? ?I do appreciate the great care that he is getting from all the staff on 3  E. ? ?Lattie Haw, MD ? ?Revelation 21:4 ?

## 2021-03-28 NOTE — Discharge Instructions (Addendum)
Ryan Davis, ? ?You were in the hospital because of abdominal pain. This appears to have been related to your urinary retention which was improved with a foley catheter. You have requested to have the catheter removed with risks discussed for recurrence of obstruction. If you find that our ability to urinate decreases, you might have to have a new catheter placed. You have been set up with hospice at home. ?

## 2021-03-29 ENCOUNTER — Inpatient Hospital Stay: Payer: Medicare Other

## 2021-03-29 ENCOUNTER — Inpatient Hospital Stay: Payer: Medicare Other | Admitting: Family

## 2021-03-29 NOTE — Consult Note (Signed)
Palliative Care Consult Note                                  Date: 03/29/2021   Patient Name: Ryan Davis  DOB: October 13, 1960  MRN: [id number]  Age / Sex: 61 y.o., male  PCP: Ryan Bellow, PA-C Referring Physician: No att. providers found  Reason for Consultation: Establishing goals of care  HPI/Patient Profile: Palliative Care consult requested for goals of care discussion in this 61 y.o. 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 He was admitted on 03/25/2021 from home with abdominal pain.   Past Medical History:  Diagnosis Date   A-fib [facility])    see 03-28-16 admission   Back pain without radiculopathy 03/28/2016   Dyspnea    endorses since treatment radiation , still gets SOB occasionally    Dysrhythmia    afib    Goals of care, counseling/discussion 06/24/2017   History of radiation therapy 09/16/16-10/06/16   35 Gy in 14 fractions to the lumbar spine   Nocturia    Prostate cancer metastatic to bone ([place]) 03/31/2016   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[place]) 03/31/2016     Subjective:   This NP Ryan Davis reviewed medical records, received report from team, assessed the patient and then met at the patient's bedside with Ryan Soho, RN  to discuss diagnosis, prognosis, GOC, EOL wishes disposition and options.   Ryan Davis is resting in bed. He is awake, alert, and oriented x4. Is able to call myself and RN by name on entrance expressing appreciation in seeing [place].   I have been following patient at [facility] for several months. We most recently had an office visit. Ongoing goals of care discussions have been had with both he, his sister, and brother. Recommendations for outpatient hospice has been made by Ryan Davis and myself however patient has been reluctant to accept.    Ryan Davis shares his pain is much more controlled. He has been able to urinate. Expresses he does not want to discharge home with a catheter. He is asking if he will be able to go home today. He states a hospital bed is being delivered today his brother and Ryan Davis are there making arrangements.   We discussed His current illness and what it means in the larger context of His on-going co-morbidities. Natural disease trajectory and expectations were discussed. He becomes somewhat withdrawn during discussions expressing "it is what it is" and "whatever happens happens". He expresses with confidence his goal is to be home with his family.   1535: Ryan Davis (patient's sister called) requesting to speak to me. I spoke at length with sister. She has spoken at length with Ryan Downer, NP and Ryan. Marin Davis (Oncology). Based on discussions plans for Ryan Davis to discharge home with hospice were decided. Ryan Davis shares they have decided not to go home with hospice. She speaks concerns on the equipment delivered to the home and request for this to be changed out to another home care company.   After creating space and opportunity for Ryan Davis to express feelings support was provided. I re-emphasize appropriateness of outpatient hospice given [name]'s poor prognosis. I also acknowledge his ability to understand and respectfully knowing he does not wish to discuss end-of-life due to fear of the unknown. We discussed his right to  understand what decisions are being made and the care he is receiving. Detailed education on hospice's goals and philosophy of care. I emphasized although patient's prognosis is poor, we (medical team) does not have a crystal ball and cannot directly pinpoint the date or time he will pass away as this is in God's hands. I created an understanding that although he would be receiving supportive care under hospice this did not mean he would pass away today or tomorrow. What we do know is medically his health will continue  to decline and their team will be able to support both him and his family every step of the way while offering support and aggressive symptom management for comfort. Patient and family aware that myself and Ryan. Marin Davis will still be available as needed but did not require follow-up in office visits. We discussed the pros and cons of having hospice support versus not with consideration to his disease trajectory. Patient and family verbalizes understanding. Confirmed agreement to continue with hospice support in the home and Ryan Davis is hopeful to get home today. Ryan Davis expressed understanding and appreciation.  They are requesting an air mattress overlay for the hospital bed and possibly a bed that will fit his height. Acknowledged request and have spoken to [facility] making them aware of request.   Questions and concerns were addressed. The family was encouraged to call with questions or concerns.  PMT will continue to support holistically as needed.  Objective:   Primary Diagnoses: Present on Admission:  Intractable abdominal pain  Prostate cancer metastatic to bone (HCC)  Hypokalemia due to excessive gastrointestinal loss of potassium  Cancer related pain  AF (paroxysmal atrial fibrillation) (HCC)  Symptomatic anemia  Thrombocytopenia (HCC)  Left leg DVT ([place])  Metastatic cancer (HCC)    Allergies  Allergen Reactions   Contrast Media [Iodinated Contrast Media] Nausea And Vomiting    Patient vomits immediately after IV contrast is injected just prior to start of scan, was unable to scan patient in correct time frame.  Patient did not warn me of this when dosing the patient with oral CM and asking the patient history questions until the patient was on the table for the CT scan just prior to injection.  Ct Tech:  Ryan Davis 07/29/17    Other Nausea And Vomiting    Patient vomits immediately after IV contrast is injected just prior to start of scan, was unable to scan patient in correct time  frame.  Patient did not warn me of this when dosing the patient with oral CM and asking the patient history questions until the patient was on the table for the CT scan just prior to injection.  Ct Tech:  Ryan Davis 07/29/17     Review of Systems  Constitutional:  Positive for activity change, appetite change and unexpected weight change.  Gastrointestinal:  Positive for abdominal pain.  Musculoskeletal:  Positive for arthralgias.  Neurological:  Positive for weakness.  Unless otherwise noted, a complete review of systems is negative.  Physical Exam General: NAD, frail, cachectic, chronically-ill appearing Cardiovascular: regular rate and rhythm Pulmonary: diminished bilaterally  Abdomen: soft, nontender, + bowel sounds Extremities: no edema, no joint deformities Skin: no rashes, warm and dry, muscle and temporal wasting  Neurological: generalized weakness, AAO x4  Vital Signs:  BP (!) 182/93 (BP Location: Right Arm)    Davis 79    Temp 97.8 F (36.6 C) (Oral)    Resp 16    Ht _0  (1.88 m)  Wt 52.6 kg    SpO2 90%    BMI 14.89 kg/m  Pain Scale: 0-10   Pain Score: 0-No pain  SpO2: SpO2: 90 % O2 Device:SpO2: 90 % O2 Flow Rate: .   IO: Intake/output summary: No intake or output data in the 24 hours ending 03/29/21 1537  LBM: Last BM Date : 03/24/21 Baseline Weight: Weight: 52.6 kg Most recent weight: Weight: 52.6 kg      Palliative Assessment/Data: PPS 20%   Advanced Care Planning:   Primary Decision Maker: PATIENT and his sister, Ryan Davis   Code Status/Advance Care Planning:   We discussed patient's code status at length with consideration of his current illness and co-morbidities. Mr. Nelles closes eyes and does not wish to discuss. Ryan Davis shares both she and her brother would not want patient to suffer. She acknowledges in the home if patient was to pass away no heroic measures are to be taken and for him to be allowed a natural and peaceful end-of-life. However, they  continue to decline documentation on chart while hospitalized to support patient's emotions. I have provided DNR form to family to have for future reference.   Assessment & Plan:   SUMMARY OF RECOMMENDATIONS   Discussed patient with Ryan Downer, NP with request to follow-up during hospitalization for ongoing support and decision making.  Continue with current plan of care. No escalation of care. Clear expressed wishes to discharge home with outpatient hospice support via [facility]. Home DME has been delivered.  I spoke with Fabio Pierce, RN with [facility] and provided updates and DME request for air matt overlay and bed extension to support Cevin's height.   PMT will continue to support and follow as needed. Please call team line with urgent needs.  Symptom Management:  Neoplasm related pain Fentanyl patch 100 mcg  Dilaudid 49m every 3-4 hours as needed for breakthrough pain Constipation Miralax twice daily Senna-S daily Lactulose as needed  Anxiety  Ativan 0.5 mg as needed   Palliative Prophylaxis:  Bowel Regimen and Frequent Pain Assessment  Additional Recommendations (Limitations, Scope, Preferences): Full Comfort Care  Psycho-social/Spiritual:  Desire for further Chaplaincy support: no Additional Recommendations: Education on Hospice  Prognosis:  Months-days   Discharge Planning:  Home with Hospice   Discussed with KErasmo Downer NP via secure chat.   Patient and sister, KJuliann Pulseexpressed understanding and was in agreement with this plan.   Time Total: 75 min.   Visit consisted of counseling and education dealing with the complex and emotionally intense issues of symptom management and palliative care in the setting of serious and potentially life-threatening illness.Greater than 50%  of this time was spent counseling and coordinating care related to the above assessment and plan.  Signed by:  NAlda Lea AGPCNP-BC Palliative Medicine Team  Phone:  [phone]: [phone]: NBjorn Pippin  Thank you for allowing the Palliative Medicine Team to assist in the care of this patient. Please utilize secure chat with additional questions, if there is no response within 30 minutes please call the above phone number. Palliative Medicine Team providers are available by phone from 7am to 5pm daily and can be reached through the team cell phone.  Should this patient require assistance outside of these hours, please call the patient's attending physician.

## 2021-04-02 ENCOUNTER — Other Ambulatory Visit: Payer: Self-pay

## 2021-04-02 ENCOUNTER — Inpatient Hospital Stay: Payer: Medicare Other | Admitting: Nurse Practitioner

## 2021-04-15 ENCOUNTER — Other Ambulatory Visit: Payer: Self-pay | Admitting: *Deleted

## 2021-04-15 ENCOUNTER — Other Ambulatory Visit: Payer: Self-pay | Admitting: Hematology & Oncology

## 2021-04-15 MED ORDER — NALOXEGOL OXALATE 12.5 MG PO TABS: 12.5000 mg | ORAL_TABLET | Freq: Every morning | ORAL | 0 refills | Status: DC

## 2021-04-15 MED ORDER — PANTOPRAZOLE SODIUM 40 MG PO TBEC: 40.0000 mg | DELAYED_RELEASE_TABLET | Freq: Every day | ORAL | 0 refills | Status: DC

## 2021-04-15 MED ORDER — DRONABINOL 2.5 MG PO CAPS: 2.5000 mg | ORAL_CAPSULE | Freq: Two times a day (BID) | ORAL | 0 refills | Status: AC

## 2021-05-08 ENCOUNTER — Telehealth: Payer: Self-pay

## 2021-05-08 NOTE — Telephone Encounter (Signed)
Received a call from Esperanza @ [facility] wanting to know if pt needs to continue the Eliquis and if so can they get a prescription for it. Almyra Free states pt is declining and not sure if he needs the Eliquis anymore.  ?Per Dr Marin Olp- No need to continue Eliquis.  ?LM advising Almyra Free of decision.  ?

## 2021-05-11 ENCOUNTER — Other Ambulatory Visit: Payer: Self-pay | Admitting: Hematology & Oncology

## 2021-05-13 ENCOUNTER — Encounter (HOSPITAL_COMMUNITY): Payer: Self-pay

## 2021-05-13 NOTE — Progress Notes (Signed)
Prior authorization request was received for Movantik 12.5 mg.  I contacted the plan.  I was unable to complete the PA because patient is under the care or [facility].  The plan will contact [facility] for authorization.  ?

## 2021-05-20 ENCOUNTER — Other Ambulatory Visit: Payer: Self-pay | Admitting: *Deleted

## 2021-05-20 DIAGNOSIS — C61 Malignant neoplasm of prostate: Secondary | ICD-10-CM

## 2021-05-20 MED ORDER — GABAPENTIN 300 MG PO CAPS: ORAL_CAPSULE | ORAL | 3 refills | Status: AC

## 2021-05-23 ENCOUNTER — Encounter: Payer: Self-pay | Admitting: *Deleted

## 2021-05-23 NOTE — Progress Notes (Signed)
Fax received from [employer] to inform Dr. Marin Olp that pt passed away on May 28, 2021 at 3:36PM.  Dr. Marin Olp notified.  ?

## 2021-05-27 DEATH — deceased
